# Patient Record
Sex: Male | Born: 1945 | Race: White | Hispanic: No | Marital: Married | Smoking: Former smoker
Health system: Southern US, Community
[De-identification: ages and names within clinical notes are randomized; demographics above are authoritative.]

## PROBLEM LIST (undated history)

## (undated) DIAGNOSIS — H269 Unspecified cataract: Secondary | ICD-10-CM

## (undated) DIAGNOSIS — Q231 Congenital insufficiency of aortic valve: Secondary | ICD-10-CM

## (undated) DIAGNOSIS — E785 Hyperlipidemia, unspecified: Secondary | ICD-10-CM

## (undated) DIAGNOSIS — R7303 Prediabetes: Secondary | ICD-10-CM

## (undated) DIAGNOSIS — Z136 Encounter for screening for cardiovascular disorders: Secondary | ICD-10-CM

## (undated) DIAGNOSIS — Q2381 Bicuspid aortic valve: Secondary | ICD-10-CM

## (undated) DIAGNOSIS — G4730 Sleep apnea, unspecified: Secondary | ICD-10-CM

## (undated) DIAGNOSIS — I10 Essential (primary) hypertension: Secondary | ICD-10-CM

## (undated) DIAGNOSIS — I2510 Atherosclerotic heart disease of native coronary artery without angina pectoris: Secondary | ICD-10-CM

## (undated) DIAGNOSIS — I499 Cardiac arrhythmia, unspecified: Secondary | ICD-10-CM

## (undated) DIAGNOSIS — M1990 Unspecified osteoarthritis, unspecified site: Secondary | ICD-10-CM

## (undated) HISTORY — DX: Bicuspid aortic valve: Q23.81

## (undated) HISTORY — DX: Congenital insufficiency of aortic valve: Q23.1

## (undated) HISTORY — DX: Encounter for screening for cardiovascular disorders: Z13.6

## (undated) HISTORY — DX: Essential (primary) hypertension: I10

## (undated) HISTORY — PX: TONSILECTOMY, ADENOIDECTOMY, BILATERAL MYRINGOTOMY AND TUBES: SHX2538

## (undated) HISTORY — PX: NOSE SURGERY: SHX723

## (undated) HISTORY — PX: COLONOSCOPY: SHX174

## (undated) HISTORY — PX: OTHER SURGICAL HISTORY: SHX169

## (undated) HISTORY — DX: Hyperlipidemia, unspecified: E78.5

## (undated) HISTORY — DX: Unspecified cataract: H26.9

## (undated) HISTORY — PX: ACROMIO-CLAVICULAR JOINT REPAIR: SHX5183

---

## 2006-12-19 LAB — HM COLONOSCOPY

## 2010-01-12 DIAGNOSIS — Z136 Encounter for screening for cardiovascular disorders: Secondary | ICD-10-CM

## 2010-01-12 HISTORY — DX: Encounter for screening for cardiovascular disorders: Z13.6

## 2011-05-05 ENCOUNTER — Ambulatory Visit (INDEPENDENT_AMBULATORY_CARE_PROVIDER_SITE_OTHER): Payer: Medicare PPO | Admitting: Family Medicine

## 2011-05-05 ENCOUNTER — Encounter: Payer: Self-pay | Admitting: Family Medicine

## 2011-05-05 DIAGNOSIS — Q2381 Bicuspid aortic valve: Secondary | ICD-10-CM

## 2011-05-05 DIAGNOSIS — Z8774 Personal history of (corrected) congenital malformations of heart and circulatory system: Secondary | ICD-10-CM | POA: Insufficient documentation

## 2011-05-05 DIAGNOSIS — E785 Hyperlipidemia, unspecified: Secondary | ICD-10-CM

## 2011-05-05 DIAGNOSIS — Z1211 Encounter for screening for malignant neoplasm of colon: Secondary | ICD-10-CM

## 2011-05-05 DIAGNOSIS — I712 Thoracic aortic aneurysm, without rupture, unspecified: Secondary | ICD-10-CM

## 2011-05-05 DIAGNOSIS — Z23 Encounter for immunization: Secondary | ICD-10-CM

## 2011-05-05 DIAGNOSIS — G4733 Obstructive sleep apnea (adult) (pediatric): Secondary | ICD-10-CM

## 2011-05-05 DIAGNOSIS — E559 Vitamin D deficiency, unspecified: Secondary | ICD-10-CM | POA: Insufficient documentation

## 2011-05-05 DIAGNOSIS — I10 Essential (primary) hypertension: Secondary | ICD-10-CM

## 2011-05-05 DIAGNOSIS — Q231 Congenital insufficiency of aortic valve: Secondary | ICD-10-CM

## 2011-05-05 MED ORDER — NIACIN ER (ANTIHYPERLIPIDEMIC) 750 MG PO TBCR: 750.0000 mg | EXTENDED_RELEASE_TABLET | Freq: Every day | ORAL | Status: DC

## 2011-05-05 MED ORDER — AMLODIPINE BESYLATE 5 MG PO TABS: 5.0000 mg | ORAL_TABLET | Freq: Every day | ORAL | Status: DC

## 2011-05-05 MED ORDER — SIMVASTATIN 20 MG PO TABS: 20.0000 mg | ORAL_TABLET | Freq: Every day | ORAL | Status: DC

## 2011-05-05 MED ORDER — LISINOPRIL 40 MG PO TABS: 40.0000 mg | ORAL_TABLET | Freq: Every day | ORAL | Status: DC

## 2011-05-05 NOTE — Assessment & Plan Note (Signed)
Change simcor to simvastatin and niacin to for cost. Sent to [facility]. F/U in 6 mo.

## 2011-05-05 NOTE — Assessment & Plan Note (Signed)
BP well controlled today. Doing well on current reg. RF sent. F/U in 6 mo.

## 2011-05-05 NOTE — Progress Notes (Signed)
  Subjective:    Patient ID: Todd Parsons, male    DOB: July 26, 1946, 65 y.o.   MRN: [id number]  HPI Here to estab care. Now has medicare and says had to switch PCPs with his Human. Had labs done in the spring and ws due for f/u in September.  He would like to split his simcor to make it cheaper.  He wants 90 day supple refills [facility] to [facility] except his fenofibrate which he wants 30 ays supply at [facility]. He tolerates his meds well. Denies any SE.    He repost his pneumonia and shigles vaccines are up to date.  He is OK with flu vac today.    Review of Systems  Constitutional: Negative for fever, diaphoresis and unexpected weight change.  HENT: Negative for hearing loss, rhinorrhea, sneezing and tinnitus.   Eyes: Negative for visual disturbance.  Respiratory: Negative for cough and wheezing.   Cardiovascular: Negative for chest pain and palpitations.  Gastrointestinal: Negative for nausea, vomiting, diarrhea and blood in stool.  Genitourinary: Negative for dysuria and discharge.  Musculoskeletal: Negative for myalgias and arthralgias.  Skin: Negative for rash.  Neurological: Negative for headaches.  Hematological: Negative for adenopathy.  Psychiatric/Behavioral: Negative for sleep disturbance and dysphoric mood. The patient is not nervous/anxious.        BP 116/71  Pulse 81  Ht 5\' 7"  (1.702 m)  Wt 205 lb (92.987 kg)  BMI 32.11 kg/m2    Allergies not on file  Past Medical History  Diagnosis Date  . Hypertension   . Hyperlipidemia     Past Surgical History  Procedure Date  . Left knee arthoscopy   . Apnea device implant   . Tonsilectomy, adenoidectomy, bilateral myringotomy and tubes     History   Social History  . Marital Status: Married    Spouse Name: N/A    Number of Children: N/A  . Years of Education: N/A   Occupational History  . Not on file.   Social History Main Topics  . Smoking status: Former Smoker    Quit date: 04/24/1978  . Smokeless  tobacco: Not on file  . Alcohol Use: Yes  . Drug Use: No  . Sexually Active: Not on file   Other Topics Concern  . Not on file   Social History Narrative  . No narrative on file    Family History  Problem Relation Age of Onset  . Heart disease Father 87    bypass surgyer  . Stroke Mother 33  . Cancer Mother     upper palate    Todd Parsons does not currently have medications on file.  Objective:   Physical Exam  Constitutional: He is oriented to person, place, and time. He appears well-developed and well-nourished.  HENT:  Head: Atraumatic.  Cardiovascular: Normal rate and regular rhythm.        2/6 SEM best heard at the RT sternal border. N ocarotid bruits.   Pulmonary/Chest: Effort normal and breath sounds normal.  Neurological: He is alert and oriented to person, place, and time.  Skin: Skin is warm and dry.  Psychiatric: He has a normal mood and affect. His behavior is normal.          Assessment & Plan:  He is due for 5 year f/u colonoscopy. Was previously seen by Dr. Inge Rise but because of insurance will have to change to [facility]. Will make new referral.

## 2011-06-08 ENCOUNTER — Encounter: Payer: Self-pay | Admitting: Family Medicine

## 2011-06-08 DIAGNOSIS — Z9889 Other specified postprocedural states: Secondary | ICD-10-CM | POA: Insufficient documentation

## 2011-06-25 LAB — HM COLONOSCOPY

## 2011-06-30 ENCOUNTER — Encounter: Payer: Self-pay | Admitting: Family Medicine

## 2011-07-28 ENCOUNTER — Other Ambulatory Visit: Payer: Self-pay | Admitting: *Deleted

## 2011-08-06 ENCOUNTER — Other Ambulatory Visit: Payer: Self-pay | Admitting: *Deleted

## 2011-08-06 MED ORDER — FLUTICASONE PROPIONATE 50 MCG/ACT NA SUSP: 1.0000 | Freq: Every day | NASAL | Status: DC

## 2011-08-06 NOTE — Telephone Encounter (Signed)
Pt calls and states he would like an rx for flonase 90 day supply sent to [facility]. Says has always taken it for his seasonal allergies and did not get rx for it when in for his visit.

## 2011-08-06 NOTE — Telephone Encounter (Signed)
Ok to send flonase for 90 day sup w/ 1 RF.

## 2011-08-09 ENCOUNTER — Other Ambulatory Visit: Payer: Self-pay | Admitting: *Deleted

## 2011-08-09 MED ORDER — FLUTICASONE PROPIONATE 50 MCG/ACT NA SUSP: 1.0000 | Freq: Every day | NASAL | Status: DC

## 2011-08-19 ENCOUNTER — Ambulatory Visit (INDEPENDENT_AMBULATORY_CARE_PROVIDER_SITE_OTHER): Payer: Medicare PPO | Admitting: Family Medicine

## 2011-08-19 ENCOUNTER — Encounter: Payer: Self-pay | Admitting: Family Medicine

## 2011-08-19 DIAGNOSIS — Z23 Encounter for immunization: Secondary | ICD-10-CM

## 2011-08-19 DIAGNOSIS — E785 Hyperlipidemia, unspecified: Secondary | ICD-10-CM

## 2011-08-19 DIAGNOSIS — I712 Thoracic aortic aneurysm, without rupture: Secondary | ICD-10-CM

## 2011-08-19 DIAGNOSIS — M546 Pain in thoracic spine: Secondary | ICD-10-CM

## 2011-08-19 MED ORDER — FENOFIBRATE 160 MG PO TABS: 160.0000 mg | ORAL_TABLET | Freq: Every day | ORAL | Status: DC

## 2011-08-19 NOTE — Patient Instructions (Signed)
Thoracic Strain You have injured the muscles or tendons that attach to the upper part of your back behind your chest. This injury is called a thoracic strain, thoracic sprain, or mid-back strain.   CAUSES   The cause of thoracic strain varies. A less severe injury involves pulling a muscle or tendon without tearing it. A more severe injury involves tearing (rupturing) a muscle or tendon. With less severe injuries, there may be little loss of strength. Sometimes, there are breaks (fractures) in the bones to which the muscles are attached. These fractures are rare, unless there was a direct hit (trauma) or you have weak bones due to osteoporosis or age. Longstanding strains may be caused by overuse or improper form during certain movements. Obesity can also increase your risk for back injuries. Sudden strains may occur due to injury or not warming up properly before exercise. Often, there is no obvious cause for a thoracic strain. SYMPTOMS   The main symptom is pain, especially with movement, such as during exercise. DIAGNOSIS   Your caregiver can usually tell what is wrong by taking an X-ray and doing a physical exam. TREATMENT    Physical therapy may be helpful for recovery. Your caregiver can give you exercises to do or refer you to a physical therapist after your pain improves.     After your pain improves, strengthening and conditioning programs appropriate for your sport or occupation may be helpful.     Always warm up before physical activities or athletics. Stretching after physical activity may also help.     Certain over-the-counter medicines may also help. Ask your caregiver if there are medicines that would help you.  If this is your first thoracic strain injury, proper care and proper healing time before starting activities should prevent long-term problems. Torn ligaments and tendons require as long to heal as broken bones. Average healing times may be only 1 week for a mild strain. For  torn muscles and tendons, healing time may be up to 6 weeks to 2 months. HOME CARE INSTRUCTIONS    Apply ice to the injured area. Ice massages may also be used as directed.     Put ice in a plastic bag.     Place a towel between your skin and the bag.     Leave the ice on for 15 to 20 minutes, 3 to 4 times a day, for the first 2 days.     Only take over-the-counter or prescription medicines for pain, discomfort, or fever as directed by your caregiver.     Keep your appointments for physical therapy if this was prescribed.     Use wraps and back braces as instructed.  SEEK IMMEDIATE MEDICAL CARE IF:    You have an increase in bruising, swelling, or pain.     Your pain has not improved with medicines.     You develop new shortness of breath, chest pain, or fever.     Problems seem to be getting worse rather than better.  MAKE SURE YOU:    Understand these instructions.     Will watch your condition.     Will get help right away if you are not doing well or get worse.  Document Released: 11/13/2003 Document Revised: 05/05/2011 Document Reviewed: 10/09/2010 [facility] Patient Information 2012 [place], [place].

## 2011-08-19 NOTE — Progress Notes (Signed)
  Subjective:    Patient ID: Todd Parsons, male    DOB: Oct 05, 1945, 65 y.o.   MRN: [id number]  HPI 8 days of dull pain under the left shoulder blade. No radiation. Worse in AM and at bedtime. Better during the aytime. Moderate activity. Uses IBU/Tylenol and helps him and helps him sleep. Better when lays on hard floor. No injury  No numbness or tingling. No SOB.  No chest Pain.  Hx of AAA.   Review of Systems     Objective:   Physical Exam  Constitutional: He is oriented to person, place, and time. He appears well-developed and well-nourished.  HENT:  Head: Normocephalic and atraumatic.  Cardiovascular: Normal rate and regular rhythm.        Soft 2/6 systolic ejection murmur best heard at the right sternal border. No abdominal bruits.  Pulmonary/Chest: Effort normal and breath sounds normal.  Musculoskeletal: He exhibits no edema.       Normal range of motion of the lumbar and thoracic spine. Shoulders with normal range of motion and strength over 5 bilaterally. He is tender just below the left scapula radiating from the spine. He is nontender over the spine itself.  Neurological: He is alert and oriented to person, place, and time.  Skin: Skin is warm and dry.  Psychiatric: He has a normal mood and affect.          Assessment & Plan:  Thoracic back pain-I do believe this is musculoskeletal and is not related to his thoracic aorta. He does have a routine followup for this in the spring. I recommend anti-inflammatories up to 3 times a day with food and water to avoid GI irritation since this has helped his pain so far. I also gave him a handout on some stretches to do. If he is not better in the next 3 weeks and please call the office and we will evaluate further. His blood pressure looks well controlled today.  Hyperlipidemia-he is to recheck his labs today. I did give him a slip for lipid panel and CMP as we also have to check kidney and liver function on him. He is not fasting today so  we'll go in the next couple weeks.  Hypertension well controlled today.

## 2011-08-23 ENCOUNTER — Telehealth: Payer: Self-pay | Admitting: Family Medicine

## 2011-08-23 MED ORDER — TRAMADOL HCL 50 MG PO TABS: 50.0000 mg | ORAL_TABLET | Freq: Three times a day (TID) | ORAL | Status: AC | PRN

## 2011-08-23 NOTE — Telephone Encounter (Signed)
Patient walk-in request to know if he can get some meds for his pain.. Was seen Friday and not feeling better.[facility]

## 2011-08-23 NOTE — Telephone Encounter (Signed)
LMOM informing Pt  

## 2011-08-23 NOTE — Telephone Encounter (Signed)
Will send over tramadol. If not helping then consider xray of thoracic spine.

## 2011-08-24 ENCOUNTER — Telehealth: Payer: Self-pay | Admitting: Family Medicine

## 2011-08-24 LAB — COMPLETE METABOLIC PANEL WITH GFR
ALT: 19 U/L (ref 0–53)
Albumin: 4.4 g/dL (ref 3.5–5.2)
CO2: 29 mEq/L (ref 19–32)
Chloride: 104 mEq/L (ref 96–112)
GFR, Est African American: 73 mL/min
GFR, Est Non African American: 63 mL/min
Glucose, Bld: 99 mg/dL (ref 70–99)
Potassium: 5.2 mEq/L (ref 3.5–5.3)
Sodium: 141 mEq/L (ref 135–145)
Total Protein: 7 g/dL (ref 6.0–8.3)

## 2011-08-24 LAB — LIPID PANEL: LDL Cholesterol: 73 mg/dL (ref 0–99)

## 2011-10-15 ENCOUNTER — Other Ambulatory Visit: Payer: Self-pay | Admitting: *Deleted

## 2011-10-15 MED ORDER — SIMVASTATIN 20 MG PO TABS: 20.0000 mg | ORAL_TABLET | Freq: Every day | ORAL | Status: DC

## 2011-10-15 MED ORDER — LISINOPRIL 40 MG PO TABS: 40.0000 mg | ORAL_TABLET | Freq: Every day | ORAL | Status: DC

## 2011-10-15 MED ORDER — FENOFIBRATE 160 MG PO TABS: 160.0000 mg | ORAL_TABLET | Freq: Every day | ORAL | Status: DC

## 2011-10-15 MED ORDER — AMLODIPINE BESYLATE 5 MG PO TABS: 5.0000 mg | ORAL_TABLET | Freq: Every day | ORAL | Status: DC

## 2011-11-29 LAB — LIPID PANEL
Cholesterol: 134 mg/dL (ref 0–200)
LDL Cholesterol: 68 mg/dL
Triglycerides: 123 mg/dL (ref 40–160)

## 2011-11-29 LAB — HEPATIC FUNCTION PANEL: Alkaline Phosphatase: 54 U/L (ref 25–125)

## 2011-11-30 ENCOUNTER — Ambulatory Visit (INDEPENDENT_AMBULATORY_CARE_PROVIDER_SITE_OTHER): Payer: Medicare PPO | Admitting: Family Medicine

## 2011-11-30 ENCOUNTER — Encounter: Payer: Self-pay | Admitting: Family Medicine

## 2011-11-30 VITALS — BP 129/75 | HR 55 | Ht 67.0 in | Wt 206.0 lb

## 2011-11-30 DIAGNOSIS — I712 Thoracic aortic aneurysm, without rupture: Secondary | ICD-10-CM

## 2011-11-30 DIAGNOSIS — I10 Essential (primary) hypertension: Secondary | ICD-10-CM

## 2011-11-30 DIAGNOSIS — Z9181 History of falling: Secondary | ICD-10-CM

## 2011-11-30 DIAGNOSIS — Z1331 Encounter for screening for depression: Secondary | ICD-10-CM

## 2011-11-30 NOTE — Progress Notes (Signed)
  Subjective:    Patient ID: Todd Parsons, male    DOB: [birth year], 66 y.o.   MRN: [id number]  Hypertension This is a chronic problem. The current episode started more than 1 year ago. The problem is controlled. Associated symptoms include shortness of breath. Pertinent negatives include no blurred vision or palpitations. There are no associated agents to hypertension. Past treatments include ACE inhibitors. There are no compliance problems.       Review of Systems  Eyes: Negative for blurred vision.  Respiratory: Positive for shortness of breath.   Cardiovascular: Negative for palpitations.       Objective:   Physical Exam  Constitutional: He is oriented to person, place, and time. He appears well-developed and well-nourished.  HENT:  Head: Normocephalic and atraumatic.  Cardiovascular: Normal rate, regular rhythm and normal heart sounds.   Pulmonary/Chest: Effort normal and breath sounds normal.  Neurological: He is alert and oriented to person, place, and time.  Skin: Skin is warm and dry.  Psychiatric: He has a normal mood and affect. His behavior is normal.          Assessment & Plan:  HTN- doing well. At goal. F/U in 6 months. REviewed his labwork. Work on exercise. Given infor on low sat diet  Depression screen - PHQ-9 score of 0, neg for depression.   Fall Assessment - Score of 1, low fall risk.

## 2011-11-30 NOTE — Patient Instructions (Signed)

## 2011-12-08 ENCOUNTER — Encounter: Payer: Self-pay | Admitting: *Deleted

## 2011-12-14 ENCOUNTER — Other Ambulatory Visit: Payer: Self-pay | Admitting: Family Medicine

## 2012-02-18 ENCOUNTER — Other Ambulatory Visit: Payer: Self-pay | Admitting: *Deleted

## 2012-02-18 MED ORDER — FENOFIBRATE 160 MG PO TABS: 160.0000 mg | ORAL_TABLET | Freq: Every day | ORAL | Status: DC

## 2012-03-07 ENCOUNTER — Ambulatory Visit (INDEPENDENT_AMBULATORY_CARE_PROVIDER_SITE_OTHER): Payer: Medicare PPO

## 2012-03-07 ENCOUNTER — Encounter: Payer: Self-pay | Admitting: Family Medicine

## 2012-03-07 ENCOUNTER — Ambulatory Visit (INDEPENDENT_AMBULATORY_CARE_PROVIDER_SITE_OTHER): Payer: Medicare PPO | Admitting: Family Medicine

## 2012-03-07 ENCOUNTER — Other Ambulatory Visit: Payer: Self-pay | Admitting: *Deleted

## 2012-03-07 VITALS — BP 132/80 | HR 79 | Ht 67.0 in | Wt 205.0 lb

## 2012-03-07 DIAGNOSIS — IMO0002 Reserved for concepts with insufficient information to code with codable children: Secondary | ICD-10-CM

## 2012-03-07 DIAGNOSIS — M25569 Pain in unspecified knee: Secondary | ICD-10-CM

## 2012-03-07 DIAGNOSIS — M25561 Pain in right knee: Secondary | ICD-10-CM

## 2012-03-07 DIAGNOSIS — M1710 Unilateral primary osteoarthritis, unspecified knee: Secondary | ICD-10-CM

## 2012-03-07 MED ORDER — MELOXICAM 7.5 MG PO TABS: 7.5000 mg | ORAL_TABLET | Freq: Two times a day (BID) | ORAL | Status: DC | PRN

## 2012-03-07 NOTE — Progress Notes (Signed)
  Subjective:    Patient ID: Todd Parsons, male    DOB: [birth year], 66 y.o.   MRN: [id number]  Knee Pain  Incident location: aggrevated w/exercise program at/through [facility]. There was no injury mechanism (L knee injury 2007 but now both knees are giving problems). The pain is present in the left knee. The quality of the pain is described as aching and shooting. The pain is mild. The pain has been intermittent since onset. Associated symptoms include tingling. Associated symptoms comments: When there is a breeze both knees start hurting him . Exacerbated by: exercising since mid March. He has tried NSAIDs and acetaminophen (arthroscopy 2007 L knee) for the symptoms. The treatment provided mild relief.    Review of Systems  Neurological: Positive for tingling.      BP 132/80  Pulse 79  Ht 5\' 7"  (1.702 m)  Wt 205 lb (92.987 kg)  BMI 32.11 kg/m2  SpO2 95% Objective:   Physical Exam  Constitutional: He is oriented to person, place, and time. He appears well-developed and well-nourished.  HENT:  Head: Normocephalic.  Musculoskeletal: Normal range of motion. He exhibits tenderness. He exhibits no edema.       There was no significant effusions present both knees appear to be stable but clicking could be heard at both knees.  Neurological: He is alert and oriented to person, place, and time.  Skin: Skin is warm and dry. No rash noted. No erythema.  Psychiatric: He has a normal mood and affect.      Offered to try medication first the patient opts to go ahead with injection. Under sterile condition the medial aspect of the left knee was injected twice to try to ensure good placement of the solution. The knee was cleaned with Betadine and topical freezing anesthetic was applied. A total of 5 ML's were injected. Consisting of 4 ML 2% lidocaine with epi and 1 mL of 80 m Depo-Medrol. Patient tolerated injection well and sent to x-ray for knee films.   Assessment & Plan:  D     Degenerative changes of both knees. Left knee was injected history of previous trauma. Return in one month for followup. Mobic 7.5 mg one tablet once twice a day. Gradually start doing more activity with the knees. Return sooner if any problems arise. May need to consider MRI in the future of the left knee if improvement does not occur.

## 2012-03-07 NOTE — Patient Instructions (Addendum)
Knee Pain The knee is the complex joint between your thigh and your lower leg. It is made up of bones, tendons, ligaments, and cartilage. The bones that make up the knee are:  The femur in the thigh.   The tibia and fibula in the lower leg.   The patella or kneecap riding in the groove on the lower femur.  CAUSES  Knee pain is a common complaint with many causes. A few of these causes are:  Injury, such as:   A ruptured ligament or tendon injury.   Torn cartilage.   Medical conditions, such as:   Gout   Arthritis   Infections   Overuse, over training or overdoing a physical activity.  Knee pain can be minor or severe. Knee pain can accompany debilitating injury. Minor knee problems often respond well to self-care measures or get well on their own. More serious injuries may need medical intervention or even surgery. SYMPTOMS The knee is complex. Symptoms of knee problems can vary widely. Some of the problems are:  Pain with movement and weight bearing.   Swelling and tenderness.   Buckling of the knee.   Inability to straighten or extend your knee.   Your knee locks and you cannot straighten it.   Warmth and redness with pain and fever.   Deformity or dislocation of the kneecap.  DIAGNOSIS  Determining what is wrong may be very straight forward such as when there is an injury. It can also be challenging because of the complexity of the knee. Tests to make a diagnosis may include:  Your caregiver taking a history and doing a physical exam.   Routine X-rays can be used to rule out other problems. X-rays will not reveal a cartilage tear. Some injuries of the knee can be diagnosed by:   Arthroscopy a surgical technique by which a small video camera is inserted through tiny incisions on the sides of the knee. This procedure is used to examine and repair internal knee joint problems. Tiny instruments can be used during arthroscopy to repair the torn knee cartilage  (meniscus).   Arthrography is a radiology technique. A contrast liquid is directly injected into the knee joint. Internal structures of the knee joint then become visible on X-ray film.   An MRI scan is a non x-ray radiology procedure in which magnetic fields and a computer produce two- or three-dimensional images of the inside of the knee. Cartilage tears are often visible using an MRI scanner. MRI scans have largely replaced arthrography in diagnosing cartilage tears of the knee.   Blood work.   Examination of the fluid that helps to lubricate the knee joint (synovial fluid). This is done by taking a sample out using a needle and a syringe.  TREATMENT The treatment of knee problems depends on the cause. Some of these treatments are:  Depending on the injury, proper casting, splinting, surgery or physical therapy care will be needed.   Give yourself adequate recovery time. Do not overuse your joints. If you begin to get sore during workout routines, back off. Slow down or do fewer repetitions.   For repetitive activities such as cycling or running, maintain your strength and nutrition.   Alternate muscle groups. For example if you are a weight lifter, work the upper body on one day and the lower body the next.   Either tight or weak muscles do not give the proper support for your knee. Tight or weak muscles do not absorb the stress placed   on the knee joint. Keep the muscles surrounding the knee strong.   Take care of mechanical problems.   If you have flat feet, orthotics or special shoes may help. See your caregiver if you need help.   Arch supports, sometimes with wedges on the inner or outer aspect of the heel, can help. These can shift pressure away from the side of the knee most bothered by osteoarthritis.   A brace called an "unloader" brace also may be used to help ease the pressure on the most arthritic side of the knee.   If your caregiver has prescribed crutches, braces,  wraps or ice, use as directed. The acronym for this is PRICE. This means protection, rest, ice, compression and elevation.   Nonsteroidal anti-inflammatory drugs (NSAID's), can help relieve pain. But if taken immediately after an injury, they may actually increase swelling. Take NSAID's with food in your stomach. Stop them if you develop stomach problems. Do not take these if you have a history of ulcers, stomach pain or bleeding from the bowel. Do not take without your caregiver's approval if you have problems with fluid retention, heart failure, or kidney problems.   For ongoing knee problems, physical therapy may be helpful.   Glucosamine and chondroitin are over-the-counter dietary supplements. Both may help relieve the pain of osteoarthritis in the knee. These medicines are different from the usual anti-inflammatory drugs. Glucosamine may decrease the rate of cartilage destruction.   Injections of a corticosteroid drug into your knee joint may help reduce the symptoms of an arthritis flare-up. They may provide pain relief that lasts a few months. You may have to wait a few months between injections. The injections do have a small increased risk of infection, water retention and elevated blood sugar levels.   Hyaluronic acid injected into damaged joints may ease pain and provide lubrication. These injections may work by reducing inflammation. A series of shots may give relief for as long as 6 months.   Topical painkillers. Applying certain ointments to your skin may help relieve the pain and stiffness of osteoarthritis. Ask your pharmacist for suggestions. Many over the-counter products are approved for temporary relief of arthritis pain.   In some countries, doctors often prescribe topical NSAID's for relief of chronic conditions such as arthritis and tendinitis. A review of treatment with NSAID creams found that they worked as well as oral medications but without the serious side effects.    PREVENTION  Maintain a healthy weight. Extra pounds put more strain on your joints.   Get strong, stay limber. Weak muscles are a common cause of knee injuries. Stretching is important. Include flexibility exercises in your workouts.   Be smart about exercise. If you have osteoarthritis, chronic knee pain or recurring injuries, you may need to change the way you exercise. This does not mean you have to stop being active. If your knees ache after jogging or playing basketball, consider switching to swimming, water aerobics or other low-impact activities, at least for a few days a week. Sometimes limiting high-impact activities will provide relief.   Make sure your shoes fit well. Choose footwear that is right for your sport.   Protect your knees. Use the proper gear for knee-sensitive activities. Use kneepads when playing volleyball or laying carpet. Buckle your seat belt every time you drive. Most shattered kneecaps occur in car accidents.   Rest when you are tired.  SEEK MEDICAL CARE IF:  You have knee pain that is continual and does not   seem to be getting better.  SEEK IMMEDIATE MEDICAL CARE IF:  Your knee joint feels hot to the touch and you have a high fever. MAKE SURE YOU:   Understand these instructions.   Will watch your condition.   Will get help right away if you are not doing well or get worse.  Document Released: 06/20/2007 Document Revised: 08/12/2011 Document Reviewed: 06/20/2007 [employer] Patient Information 2012 [employer]. 

## 2012-05-19 ENCOUNTER — Encounter: Payer: Self-pay | Admitting: Sports Medicine

## 2012-05-19 ENCOUNTER — Ambulatory Visit (INDEPENDENT_AMBULATORY_CARE_PROVIDER_SITE_OTHER): Payer: Medicare PPO | Admitting: Sports Medicine

## 2012-05-19 VITALS — BP 124/83 | HR 80 | Wt 205.0 lb

## 2012-05-19 DIAGNOSIS — Z23 Encounter for immunization: Secondary | ICD-10-CM

## 2012-05-19 DIAGNOSIS — M1710 Unilateral primary osteoarthritis, unspecified knee: Secondary | ICD-10-CM

## 2012-05-19 DIAGNOSIS — IMO0002 Reserved for concepts with insufficient information to code with codable children: Secondary | ICD-10-CM

## 2012-05-19 DIAGNOSIS — M1712 Unilateral primary osteoarthritis, left knee: Secondary | ICD-10-CM | POA: Insufficient documentation

## 2012-05-19 NOTE — Assessment & Plan Note (Signed)
Failed conservative management. Corticosteroid injection was moderately efficacious for only about one month. Supartz #1 of 5 started today. He will come back in one week for Supartz injection #2 of 5

## 2012-05-19 NOTE — Progress Notes (Signed)
Patient ID: Chae Robley, male   DOB: 02/25/1946, 66 y.o.   MRN: [id number] Subjective:    I'm seeing this patient as a consultation for:  Dr. Thurmond Butts  CC: Left knee pain  HPI: Imran is a pleasant 66 year old male who is in good health, until several months ago. He began to have some pain in his both knees. He tried some over-the-counter analgesics which were ineffective, at which point he received a steroid injection of his left knee. This lasted just over a month, and the pain came back. The pain is localized underneath his kneecap, and is associated with morning stiffness, and gelling. He denies any swelling, and denies any buckling, popping, or locking. He is never undergone viscous supplementation.  Past medical history, Surgical history, Family history, Social history, Allergies, and medications have been entered into the medical record, reviewed, and no changes needed.   Review of Systems: No headache, visual changes, nausea, vomiting, diarrhea, constipation, dizziness, abdominal pain, skin rash, fevers, chills, night sweats, weight loss, body aches, joint swelling, muscle aches, chest pain, or shortness of breath.   Objective:   Vitals:  Afebrile, vital signs stable. General: Well Developed, well nourished, and in no acute distress.  Neuro/Psych: Alert and oriented x3, extra-ocular muscles intact, able to move all 4 extremities.  Skin: Warm and dry, no rashes noted.  Respiratory: Not using accessory muscles, speaking in full sentences, trachea midline.  Cardiovascular: Pulses palpable, no extremity edema. Abdomen: Does not appear distended. Left Knee: Normal to inspection with no erythema or effusion or obvious bony abnormalities. Palpation normal with no warmth, joint line tenderness, patellar tenderness, or condyle tenderness. ROM full in flexion and extension and lower leg rotation. Ligaments with solid consistent endpoints including ACL, PCL, LCL, MCL. Negative Mcmurray's, Apley's, and  Thessalonian tests. Non painful patellar compression. Patellar glide with crepitus Patellar and quadriceps tendons unremarkable. Hamstring and quadriceps strength is normal.   I did review the x-rays of his knee which show minimal tibiofemoral degenerative changes, but moderate patellofemoral degenerative changes.  Procedure: Real-time Ultrasound Guided Injection of left knee Device: GE Logiq E  Ultrasound guided injection is preferred based studies that show increased duration, increased effect, greater accuracy, decreased procedural pain, increased response rate, and decreased cost with ultrasound guided versus blind injection.  Verbal informed consent obtained.  Time-out conducted.  Noted no overlying erythema, induration, or other signs of local infection.  Skin prepped in a sterile fashion.  Local anesthesia: Topical Ethyl chloride.  With sterile technique and under real time ultrasound guidance:  25 mg/2.5 mL of Supartz (sodium hyaluronate) in a prefilled syringe was injected easily into the knee through a 22-gauge needle into the suprapatellar recess. Completed without difficulty  Pain immediately resolved suggesting accurate placement of the medication.  Advised to call if fevers/chills, erythema, induration, drainage, or persistent bleeding.  Images permanently stored and available for review in the ultrasound unit.  Impression: Technically successful ultrasound guided injection.   Impression and Recommendations:   This case required medical decision making of moderate complexity.

## 2012-05-24 LAB — LIPID PANEL
Cholesterol: 163 mg/dL (ref 0–200)
HDL: 34 mg/dL — AB (ref 35–70)
Triglycerides: 191 mg/dL — AB (ref 40–160)

## 2012-05-24 LAB — HEPATIC FUNCTION PANEL: AST: 21 U/L (ref 14–40)

## 2012-05-24 LAB — BASIC METABOLIC PANEL: Glucose: 119 mg/dL

## 2012-05-25 ENCOUNTER — Encounter: Payer: Self-pay | Admitting: Family Medicine

## 2012-05-25 ENCOUNTER — Ambulatory Visit (INDEPENDENT_AMBULATORY_CARE_PROVIDER_SITE_OTHER): Payer: Medicare PPO | Admitting: Sports Medicine

## 2012-05-25 ENCOUNTER — Encounter: Payer: Medicare PPO | Admitting: Family Medicine

## 2012-05-25 DIAGNOSIS — M1712 Unilateral primary osteoarthritis, left knee: Secondary | ICD-10-CM

## 2012-05-25 DIAGNOSIS — IMO0002 Reserved for concepts with insufficient information to code with codable children: Secondary | ICD-10-CM

## 2012-05-25 DIAGNOSIS — M1710 Unilateral primary osteoarthritis, unspecified knee: Secondary | ICD-10-CM

## 2012-05-25 NOTE — Assessment & Plan Note (Signed)
Supartz injection #2 of 5. He'll come back in one week for #3.

## 2012-05-25 NOTE — Progress Notes (Signed)
Subjective:    CC: Supartz injection #2 of 5 into the left knee  HPI: Todd Parsons presents for injection as above.  Past medical history, Surgical history, Family history, Social history, Allergies, and medications have been entered into the medical record, reviewed, and no changes needed.   Review of Systems: No fevers, chills, night sweats, weight loss, chest pain, or shortness of breath.   Objective:    General: Well Developed, well nourished, and in no acute distress.  Procedure: Real-time Ultrasound Guided Injection of left knee Device: GE Logiq E  Ultrasound guided injection is preferred based studies that show increased duration, increased effect, greater accuracy, decreased procedural pain, increased response rate, and decreased cost with ultrasound guided versus blind injection.  Verbal informed consent obtained.  Time-out conducted.  Noted no overlying erythema, induration, or other signs of local infection.  Skin prepped in a sterile fashion.  Local anesthesia: Topical Ethyl chloride.  With sterile technique and under real time ultrasound guidance:  25 mg/2.5 mL of Supartz (sodium hyaluronate) in a prefilled syringe was injected easily into the knee through a 22-gauge needle into the suprapatellar recess under real-time ultrasound guidance. Completed without difficulty  Pain immediately resolved suggesting accurate placement of the medication.  Advised to call if fevers/chills, erythema, induration, drainage, or persistent bleeding.  Images permanently stored and available for review in the ultrasound unit.  Impression: Technically successful ultrasound guided injection.   Impression and Recommendations:

## 2012-05-26 ENCOUNTER — Ambulatory Visit: Payer: Medicare PPO | Admitting: Sports Medicine

## 2012-06-01 ENCOUNTER — Ambulatory Visit: Payer: Medicare PPO | Admitting: Family Medicine

## 2012-06-01 ENCOUNTER — Encounter: Payer: Self-pay | Admitting: Sports Medicine

## 2012-06-01 ENCOUNTER — Ambulatory Visit (INDEPENDENT_AMBULATORY_CARE_PROVIDER_SITE_OTHER): Payer: Medicare PPO | Admitting: Sports Medicine

## 2012-06-01 VITALS — BP 121/65 | HR 74 | Temp 98.3°F | Wt 206.0 lb

## 2012-06-01 DIAGNOSIS — M1710 Unilateral primary osteoarthritis, unspecified knee: Secondary | ICD-10-CM

## 2012-06-01 DIAGNOSIS — M1712 Unilateral primary osteoarthritis, left knee: Secondary | ICD-10-CM

## 2012-06-01 DIAGNOSIS — IMO0002 Reserved for concepts with insufficient information to code with codable children: Secondary | ICD-10-CM

## 2012-06-01 NOTE — Progress Notes (Signed)
Subjective:    CC: Supartz injection #3 of 5 into the left knee  HPI: Todd Parsons presents for injection as above.  Overall he is experiencing a good amount of benefit so far  Past medical history, Surgical history, Family history, Social history, Allergies, and medications have been entered into the medical record, reviewed, and no changes needed.   Review of Systems: No fevers, chills, night sweats, weight loss, chest pain, or shortness of breath.   Objective:    General: Well Developed, well nourished, and in no acute distress.  Procedure: Real-time Ultrasound Guided Injection of left knee Device: GE Logiq E  Ultrasound guided injection is preferred based studies that show increased duration, increased effect, greater accuracy, decreased procedural pain, increased response rate, and decreased cost with ultrasound guided versus blind injection.  Verbal informed consent obtained.  Time-out conducted.  Noted no overlying erythema, induration, or other signs of local infection.  Skin prepped in a sterile fashion.  Local anesthesia: Topical Ethyl chloride.  With sterile technique and under real time ultrasound guidance:  25 mg/2.5 mL of Supartz (sodium hyaluronate) in a prefilled syringe was injected easily into the knee through a 22-gauge needle into the suprapatellar recess under real-time ultrasound guidance. Completed without difficulty  Pain immediately resolved suggesting accurate placement of the medication.  Advised to call if fevers/chills, erythema, induration, drainage, or persistent bleeding.  Images permanently stored and available for review in the ultrasound unit.  Impression: Technically successful ultrasound guided injection.   Impression and Recommendations:

## 2012-06-01 NOTE — Assessment & Plan Note (Signed)
Supartz injection #3 of 5 given today. He is experiencing some benefit. He'll come back in one week for injection #4 of 5.

## 2012-06-07 ENCOUNTER — Encounter: Payer: Self-pay | Admitting: *Deleted

## 2012-06-08 ENCOUNTER — Ambulatory Visit (INDEPENDENT_AMBULATORY_CARE_PROVIDER_SITE_OTHER): Payer: Medicare PPO | Admitting: Sports Medicine

## 2012-06-08 ENCOUNTER — Encounter: Payer: Self-pay | Admitting: Sports Medicine

## 2012-06-08 VITALS — BP 114/66 | HR 96

## 2012-06-08 DIAGNOSIS — M1712 Unilateral primary osteoarthritis, left knee: Secondary | ICD-10-CM

## 2012-06-08 DIAGNOSIS — M1710 Unilateral primary osteoarthritis, unspecified knee: Secondary | ICD-10-CM

## 2012-06-08 DIAGNOSIS — IMO0002 Reserved for concepts with insufficient information to code with codable children: Secondary | ICD-10-CM

## 2012-06-08 NOTE — Progress Notes (Signed)
Subjective:    CC: Supartz injection #4 of 5 into the left knee  HPI: Todd Parsons presents for injection as above.  Overall he is experiencing an excellent amount of benefit so far  Past medical history, Surgical history, Family history, Social history, Allergies, and medications have been entered into the medical record, reviewed, and no changes needed.   Review of Systems: No fevers, chills, night sweats, weight loss, chest pain, or shortness of breath.   Objective:    General: Well Developed, well nourished, and in no acute distress.  Procedure: Real-time Ultrasound Guided Injection of left knee Device: GE Logiq E  Ultrasound guided injection is preferred based studies that show increased duration, increased effect, greater accuracy, decreased procedural pain, increased response rate, and decreased cost with ultrasound guided versus blind injection.  Verbal informed consent obtained.  Time-out conducted.  Noted no overlying erythema, induration, or other signs of local infection.  Skin prepped in a sterile fashion.  Local anesthesia: Topical Ethyl chloride.  With sterile technique and under real time ultrasound guidance:  25 mg/2.5 mL of Supartz (sodium hyaluronate) in a prefilled syringe was injected easily into the knee through a 22-gauge needle into the suprapatellar recess under real-time ultrasound guidance. Completed without difficulty  Pain immediately resolved suggesting accurate placement of the medication.  Advised to call if fevers/chills, erythema, induration, drainage, or persistent bleeding.  Images permanently stored and available for review in the ultrasound unit.  Impression: Technically successful ultrasound guided injection.   Impression and Recommendations:

## 2012-06-08 NOTE — Assessment & Plan Note (Signed)
Supartz injection #4 of 5 given today. He is experiencing some benefit. He'll come back in one week for injection #5 of 5.

## 2012-06-14 NOTE — Progress Notes (Signed)
This encounter was created in error - please disregard.

## 2012-06-15 ENCOUNTER — Encounter: Payer: Self-pay | Admitting: Sports Medicine

## 2012-06-15 ENCOUNTER — Encounter: Payer: Self-pay | Admitting: *Deleted

## 2012-06-15 ENCOUNTER — Ambulatory Visit (INDEPENDENT_AMBULATORY_CARE_PROVIDER_SITE_OTHER): Payer: Medicare PPO | Admitting: Sports Medicine

## 2012-06-15 VITALS — BP 116/69 | HR 60 | Ht 67.0 in | Wt 206.0 lb

## 2012-06-15 DIAGNOSIS — W57XXXA Bitten or stung by nonvenomous insect and other nonvenomous arthropods, initial encounter: Secondary | ICD-10-CM | POA: Insufficient documentation

## 2012-06-15 DIAGNOSIS — T148 Other injury of unspecified body region: Secondary | ICD-10-CM

## 2012-06-15 DIAGNOSIS — M1712 Unilateral primary osteoarthritis, left knee: Secondary | ICD-10-CM

## 2012-06-15 DIAGNOSIS — IMO0002 Reserved for concepts with insufficient information to code with codable children: Secondary | ICD-10-CM

## 2012-06-15 DIAGNOSIS — M1710 Unilateral primary osteoarthritis, unspecified knee: Secondary | ICD-10-CM

## 2012-06-15 MED ORDER — TRIAMCINOLONE ACETONIDE 0.5 % EX CREA: TOPICAL_CREAM | Freq: Two times a day (BID) | CUTANEOUS | Status: DC

## 2012-06-15 NOTE — Progress Notes (Signed)
Subjective:    CC: Supartz injection, insect bites  HPI: Left knee osteoarthritis: oral comes in for Supartz injection #5 of 5, he's noted excellent benefit, and is back to exercising, and is no longer working up from sleep.  Was bitten yesterday by several yellow jackets, located on his arms, wondering if there is a cream that can be used. It's itchy, red, does not radiate. No shortness of breath.  Past medical history, Surgical history, Family history, Social history, Allergies, and medications have been entered into the medical record, reviewed, and no changes needed.   Review of Systems: No fevers, chills, night sweats, weight loss, chest pain, or shortness of breath.   Objective:    General: Well Developed, well nourished, and in no acute distress.  Neuro: Alert and oriented x3, extra-ocular muscles intact.  HEENT: Normocephalic, atraumatic, pupils equal round reactive to light, neck supple, no masses, no lymphadenopathy, thyroid nonpalpable.  Skin: Warm and dry, erythematous insect bites, excoriated noted on his arms. Respiratory: Not using accessory muscles, speaking in full sentences.  Procedure: Real-time Ultrasound Guided Injection of left knee Device: GE Logiq E  Ultrasound guided injection is preferred based studies that show increased duration, increased effect, greater accuracy, decreased procedural pain, increased response rate, and decreased cost with ultrasound guided versus blind injection.  Verbal informed consent obtained.  Time-out conducted.  Noted no overlying erythema, induration, or other signs of local infection.  Skin prepped in a sterile fashion.  Local anesthesia: Topical Ethyl chloride.  With sterile technique and under real time ultrasound guidance:  25 mg/2.5 mL of Supartz (sodium hyaluronate) in a prefilled syringe was injected easily into the knee through a 22-gauge needle. Completed without difficulty  Pain immediately resolved suggesting accurate  placement of the medication.  Advised to call if fevers/chills, erythema, induration, drainage, or persistent bleeding.  Images permanently stored and available for review in the ultrasound unit.  Impression: Technically successful ultrasound guided injection.  Impression and Recommendations:

## 2012-06-15 NOTE — Assessment & Plan Note (Signed)
Yellow jacket stings. Triamcinolone cream.

## 2012-06-15 NOTE — Assessment & Plan Note (Signed)
Fifth and final Supartz injection given today. He will come back to see me in one month to reassess his situation.

## 2012-07-26 ENCOUNTER — Encounter: Payer: Self-pay | Admitting: Sports Medicine

## 2012-07-26 ENCOUNTER — Ambulatory Visit (INDEPENDENT_AMBULATORY_CARE_PROVIDER_SITE_OTHER): Payer: Medicare PPO | Admitting: Sports Medicine

## 2012-07-26 VITALS — BP 141/89 | HR 60 | Temp 98.2°F | Wt 205.0 lb

## 2012-07-26 DIAGNOSIS — M1710 Unilateral primary osteoarthritis, unspecified knee: Secondary | ICD-10-CM

## 2012-07-26 DIAGNOSIS — M1711 Unilateral primary osteoarthritis, right knee: Secondary | ICD-10-CM | POA: Insufficient documentation

## 2012-07-26 DIAGNOSIS — M1712 Unilateral primary osteoarthritis, left knee: Secondary | ICD-10-CM

## 2012-07-26 DIAGNOSIS — IMO0002 Reserved for concepts with insufficient information to code with codable children: Secondary | ICD-10-CM

## 2012-07-26 NOTE — Progress Notes (Signed)
SPORTS MEDICINE CONSULTATION REPORT  Subjective:    CC: Knee pain  HPI: Left knee: He is finished the entire Supartz series for primary osteoarthritis, symptoms are vastly improved.  Right knee: He is now noting pain in the right knee, both joint lines, worse with ambulation, and going downstairs, no radiation, severe.  He's been using oral analgesics, he does not been helping. He denies mechanical symptoms, does have some swelling.   Past medical history, Surgical history, Family history, Social history, Allergies, and medications have been entered into the medical record, reviewed, and no changes needed.   Review of Systems: No headache, visual changes, nausea, vomiting, diarrhea, constipation, dizziness, abdominal pain, skin rash, fevers, chills, night sweats, weight loss, swollen lymph nodes, body aches, joint swelling, muscle aches, chest pain, or shortness of breath.   Objective:   Vitals:  Afebrile, vital signs stable. General: Well Developed, well nourished, and in no acute distress.  Neuro/Psych: Alert and oriented x3, extra-ocular muscles intact, able to move all 4 extremities.  Skin: Warm and dry, no rashes noted.  Respiratory: Not using accessory muscles, speaking in full sentences, trachea midline.  Cardiovascular: Pulses palpable, no extremity edema. Abdomen: Does not appear distended. Right Knee: Normal to inspection with no erythema or effusion or obvious bony abnormalities. Tender to palpation on both joint lines, moderate effusion palpable with a fluid wave. ROM full in flexion and extension and lower leg rotation. Ligaments with solid consistent endpoints including ACL, PCL, LCL, MCL. Negative Mcmurray's, Apley's, and Thessalonian tests. Non painful patellar compression. Patellar glide without crepitus. Patellar and quadriceps tendons unremarkable. Hamstring and quadriceps strength is normal.   Procedure: Real-time Ultrasound Guided aspiration and injection of  right knee. Device: GE Logiq E  Ultrasound guided injection is preferred based studies that show increased duration, increased effect, greater accuracy, decreased procedural pain, increased response rate, and decreased cost with ultrasound guided versus blind injection.  Verbal informed consent obtained.  Time-out conducted.  Noted no overlying erythema, induration, or other signs of local infection.  Skin prepped in a sterile fashion.  Local anesthesia: Topical Ethyl chloride.  With sterile technique and under real time ultrasound guidance:  5 cc of lidocaine infiltrated into the patellar recess overlying skin. 18-gauge needle with a 60 cc syringe advanced under ultrasound guidance into suprapatellar recess which was distended with fluid. 28 cc of straw-colored fluid aspirated, syringe switched, 2 cc triamcinolone 40, 4 cc lidocaine injected into the knee. Completed without difficulty  Pain immediately resolved suggesting accurate placement of the medication.  Advised to call if fevers/chills, erythema, induration, drainage, or persistent bleeding.  Images permanently stored and available for review in the ultrasound unit.  Impression: Technically successful ultrasound guided injection.  I then strapped to the knee with elastic bandage myself.  Impression and Recommendations:   This case required medical decision making of moderate complexity.

## 2012-07-26 NOTE — Assessment & Plan Note (Addendum)
Continues to do well after finishing his first Supartz series. We can repeat this up to every 6 months if benefit remains good.

## 2012-07-26 NOTE — Assessment & Plan Note (Signed)
Injection and aspiration as above. Ace wrap. Come back to see me in one month to see how things are going, if no better we can start Visco supplementation.

## 2012-08-08 ENCOUNTER — Other Ambulatory Visit: Payer: Self-pay | Admitting: *Deleted

## 2012-08-08 ENCOUNTER — Encounter: Payer: Self-pay | Admitting: Sports Medicine

## 2012-08-08 ENCOUNTER — Ambulatory Visit (INDEPENDENT_AMBULATORY_CARE_PROVIDER_SITE_OTHER): Payer: Medicare PPO | Admitting: Sports Medicine

## 2012-08-08 VITALS — BP 108/76 | HR 81 | Wt 203.0 lb

## 2012-08-08 DIAGNOSIS — IMO0002 Reserved for concepts with insufficient information to code with codable children: Secondary | ICD-10-CM

## 2012-08-08 DIAGNOSIS — M1711 Unilateral primary osteoarthritis, right knee: Secondary | ICD-10-CM

## 2012-08-08 DIAGNOSIS — M1710 Unilateral primary osteoarthritis, unspecified knee: Secondary | ICD-10-CM

## 2012-08-08 MED ORDER — FENOFIBRATE 160 MG PO TABS: 160.0000 mg | ORAL_TABLET | Freq: Every day | ORAL | Status: DC

## 2012-08-08 MED ORDER — SIMVASTATIN 20 MG PO TABS: 20.0000 mg | ORAL_TABLET | Freq: Every day | ORAL | Status: DC

## 2012-08-08 MED ORDER — TRAMADOL HCL 50 MG PO TABS: 50.0000 mg | ORAL_TABLET | Freq: Three times a day (TID) | ORAL | Status: DC | PRN

## 2012-08-08 MED ORDER — FLUTICASONE PROPIONATE 50 MCG/ACT NA SUSP: 1.0000 | Freq: Every day | NASAL | Status: DC

## 2012-08-08 MED ORDER — AMLODIPINE BESYLATE 5 MG PO TABS: 5.0000 mg | ORAL_TABLET | Freq: Every day | ORAL | Status: DC

## 2012-08-08 MED ORDER — LISINOPRIL 40 MG PO TABS: 40.0000 mg | ORAL_TABLET | Freq: Every day | ORAL | Status: DC

## 2012-08-08 MED ORDER — CAPSAICIN 0.1 % EX CREA: TOPICAL_CREAM | CUTANEOUS | Status: DC

## 2012-08-08 NOTE — Progress Notes (Signed)
SPORTS MEDICINE CONSULTATION REPORT  Subjective:    CC: Followup, increasing pain in knees.  HPI: Right knee: I aspirated, and injected his knee the last visit, he had excellent response for about 10 days, unfortunately the pain has started to creep back. He has known bilateral osteoarthritis. He localizes the pain in both joint lines, and notes it's worse with weightbearing. The pain does not radiate.  Left knee: He has finished a Supartz series recently. Overall is doing very well, however unfortunately he started to note an increase in his pain again. He is only using Tylenol. He is never done formal physical therapy.  Past medical history, Surgical history, Family history, Social history, Allergies, and medications have been entered into the medical record, reviewed, and no changes needed.   Review of Systems: No headache, visual changes, nausea, vomiting, diarrhea, constipation, dizziness, abdominal pain, skin rash, fevers, chills, night sweats, weight loss, swollen lymph nodes, body aches, joint swelling, muscle aches, chest pain, shortness of breath, mood changes, visual or auditory hallucinations.   Objective:   Vitals:  Afebrile, vital signs stable. General: Well Developed, well nourished, and in no acute distress.  Neuro/Psych: Alert and oriented x3, extra-ocular muscles intact, able to move all 4 extremities.  Skin: Warm and dry, no rashes noted.  Respiratory: Not using accessory muscles, speaking in full sentences, trachea midline.  Cardiovascular: Pulses palpable, no extremity edema. Abdomen: Does not appear distended. Bilateral Knees: Normal to inspection with no erythema or effusion or obvious bony abnormalities. Palpation normal with no warmth, joint line tenderness, patellar tenderness, or condyle tenderness. ROM full in flexion and extension and lower leg rotation. Ligaments with solid consistent endpoints including ACL, PCL, LCL, MCL. Negative Mcmurray's, Apley's, and  Thessalonian tests. Non painful patellar compression. Patellar glide without crepitus. Patellar and quadriceps tendons unremarkable. Hamstring and quadriceps strength is normal.   Procedure: Real-time Ultrasound Guided Injection of right knee Device: GE Logiq E  Ultrasound guided injection is preferred based studies that show increased duration, increased effect, greater accuracy, decreased procedural pain, increased response rate, and decreased cost with ultrasound guided versus blind injection.  Verbal informed consent obtained.  Time-out conducted.  Noted no overlying erythema, induration, or other signs of local infection.  Skin prepped in a sterile fashion.  Local anesthesia: Topical Ethyl chloride.  With sterile technique and under real time ultrasound guidance:  25 mg/2.5 mL of Supartz (sodium hyaluronate) in a prefilled syringe was injected easily into the knee through a 22-gauge needle. Completed without difficulty  Pain immediately resolved suggesting accurate placement of the medication.  Advised to call if fevers/chills, erythema, induration, drainage, or persistent bleeding.  Images permanently stored and available for review in the ultrasound unit.  Impression: Technically successful ultrasound guided injection.  Impression and Recommendations:   This case required medical decision making of moderate complexity.

## 2012-08-08 NOTE — Assessment & Plan Note (Addendum)
Failed steroid injection. Supartz injection #1 of 5 given today. Return for #2. I would also like him to try one visit with physical therapy to learn some osteoarthritis specific exercises. Tramadol as needed for pain. He can also try capsaicin.

## 2012-08-11 ENCOUNTER — Ambulatory Visit: Payer: Medicare PPO | Attending: Sports Medicine | Admitting: Physical Therapy

## 2012-08-11 DIAGNOSIS — M25569 Pain in unspecified knee: Secondary | ICD-10-CM | POA: Insufficient documentation

## 2012-08-11 DIAGNOSIS — IMO0001 Reserved for inherently not codable concepts without codable children: Secondary | ICD-10-CM | POA: Insufficient documentation

## 2012-08-11 DIAGNOSIS — M6281 Muscle weakness (generalized): Secondary | ICD-10-CM | POA: Insufficient documentation

## 2012-08-15 ENCOUNTER — Encounter: Payer: Self-pay | Admitting: Sports Medicine

## 2012-08-15 ENCOUNTER — Ambulatory Visit (INDEPENDENT_AMBULATORY_CARE_PROVIDER_SITE_OTHER): Payer: Medicare PPO | Admitting: Sports Medicine

## 2012-08-15 VITALS — BP 120/82 | HR 83 | Wt 201.0 lb

## 2012-08-15 DIAGNOSIS — M1712 Unilateral primary osteoarthritis, left knee: Secondary | ICD-10-CM

## 2012-08-15 DIAGNOSIS — M1710 Unilateral primary osteoarthritis, unspecified knee: Secondary | ICD-10-CM

## 2012-08-15 DIAGNOSIS — IMO0002 Reserved for concepts with insufficient information to code with codable children: Secondary | ICD-10-CM

## 2012-08-15 DIAGNOSIS — M1711 Unilateral primary osteoarthritis, right knee: Secondary | ICD-10-CM

## 2012-08-15 NOTE — Assessment & Plan Note (Signed)
Doing well status post Supartz series. Continue formal physical therapy.

## 2012-08-15 NOTE — Assessment & Plan Note (Signed)
Supartz injection #2 of 5 given today. Continue physical therapy. Return in one week for #3 of 5.

## 2012-08-15 NOTE — Progress Notes (Signed)
Procedure: Real-time Ultrasound Guided Injection of right knee Device: GE Logiq E  Ultrasound guided injection is preferred based studies that show increased duration, increased effect, greater accuracy, decreased procedural pain, increased response rate, and decreased cost with ultrasound guided versus blind injection.  Verbal informed consent obtained.  Time-out conducted.  Noted no overlying erythema, induration, or other signs of local infection.  Skin prepped in a sterile fashion.  Local anesthesia: Topical Ethyl chloride.  With sterile technique and under real time ultrasound guidance:  25 mg/2.5 mL of Supartz (sodium hyaluronate) in a prefilled syringe was injected easily into the knee through a 22-gauge needle. Completed without difficulty  Pain immediately resolved suggesting accurate placement of the medication.  Advised to call if fevers/chills, erythema, induration, drainage, or persistent bleeding.  Images permanently stored and available for review in the ultrasound unit.  Impression: Technically successful ultrasound guided injection. 

## 2012-08-22 ENCOUNTER — Encounter: Payer: Self-pay | Admitting: Sports Medicine

## 2012-08-22 ENCOUNTER — Ambulatory Visit (INDEPENDENT_AMBULATORY_CARE_PROVIDER_SITE_OTHER): Payer: Medicare PPO | Admitting: Sports Medicine

## 2012-08-22 ENCOUNTER — Ambulatory Visit: Payer: Medicare PPO | Admitting: Physical Therapy

## 2012-08-22 VITALS — BP 120/83 | HR 80 | Wt 202.0 lb

## 2012-08-22 DIAGNOSIS — M1710 Unilateral primary osteoarthritis, unspecified knee: Secondary | ICD-10-CM

## 2012-08-22 DIAGNOSIS — M1712 Unilateral primary osteoarthritis, left knee: Secondary | ICD-10-CM

## 2012-08-22 DIAGNOSIS — M1711 Unilateral primary osteoarthritis, right knee: Secondary | ICD-10-CM

## 2012-08-22 DIAGNOSIS — IMO0002 Reserved for concepts with insufficient information to code with codable children: Secondary | ICD-10-CM

## 2012-08-22 MED ORDER — DICLOFENAC SODIUM 1 % TD GEL: 4.0000 g | Freq: Four times a day (QID) | TRANSDERMAL | Status: DC

## 2012-08-22 NOTE — Assessment & Plan Note (Signed)
Supartz injection #3 of 5 given today. Continue physical therapy. Return in one week for #4 of 5. Pain greatly improved.

## 2012-08-22 NOTE — Assessment & Plan Note (Signed)
Status post Supartz series. Some days are good, some are bad. I'm going to add Voltaren gel. He will continue formal physical therapy.

## 2012-08-22 NOTE — Progress Notes (Signed)
Procedure: Real-time Ultrasound Guided Injection of right knee Device: GE Logiq E  Ultrasound guided injection is preferred based studies that show increased duration, increased effect, greater accuracy, decreased procedural pain, increased response rate, and decreased cost with ultrasound guided versus blind injection.  Verbal informed consent obtained.  Time-out conducted.  Noted no overlying erythema, induration, or other signs of local infection.  Skin prepped in a sterile fashion.  Local anesthesia: Topical Ethyl chloride.  With sterile technique and under real time ultrasound guidance:  25 mg/2.5 mL of Supartz (sodium hyaluronate) in a prefilled syringe was injected easily into the knee through a 22-gauge needle. Completed without difficulty  Pain immediately resolved suggesting accurate placement of the medication.  Advised to call if fevers/chills, erythema, induration, drainage, or persistent bleeding.  Images permanently stored and available for review in the ultrasound unit.  Impression: Technically successful ultrasound guided injection. 

## 2012-08-23 ENCOUNTER — Ambulatory Visit: Payer: Medicare PPO | Admitting: Sports Medicine

## 2012-08-28 ENCOUNTER — Ambulatory Visit (INDEPENDENT_AMBULATORY_CARE_PROVIDER_SITE_OTHER): Payer: Medicare PPO | Admitting: Sports Medicine

## 2012-08-28 VITALS — BP 127/94 | HR 69 | Wt 201.0 lb

## 2012-08-28 DIAGNOSIS — M1711 Unilateral primary osteoarthritis, right knee: Secondary | ICD-10-CM

## 2012-08-28 DIAGNOSIS — IMO0002 Reserved for concepts with insufficient information to code with codable children: Secondary | ICD-10-CM

## 2012-08-28 DIAGNOSIS — M1710 Unilateral primary osteoarthritis, unspecified knee: Secondary | ICD-10-CM

## 2012-08-28 NOTE — Assessment & Plan Note (Signed)
Supartz injection #4 of 5 given today. Continue physical therapy. Return in one week for #5 of 5. Pain greatly improved.

## 2012-08-28 NOTE — Progress Notes (Signed)
Procedure: Real-time Ultrasound Guided aspiration/Injection of right knee Device: GE Logiq E  Ultrasound guided injection is preferred based studies that show increased duration, increased effect, greater accuracy, decreased procedural pain, increased response rate, and decreased cost with ultrasound guided versus blind injection.  Verbal informed consent obtained.  Time-out conducted.  Noted no overlying erythema, induration, or other signs of local infection.  Skin prepped in a sterile fashion.  Local anesthesia: Topical Ethyl chloride.  With sterile technique and under real time ultrasound guidance: 5 cc of lidocaine infiltrated subcutaneously, 18-gauge needle with a 60 cc syringe used to aspirate 10 cc of straw-colored fluid, syringe switched and  25 mg/2.5 mL of Supartz (sodium hyaluronate) in a prefilled syringe was injected easily into the knee through the same 18-gauge needle. Completed without difficulty  Pain immediately resolved suggesting accurate placement of the medication.  Advised to call if fevers/chills, erythema, induration, drainage, or persistent bleeding.  Images permanently stored and available for review in the ultrasound unit.  Impression: Technically successful ultrasound guided injection.

## 2012-08-31 ENCOUNTER — Ambulatory Visit: Payer: Medicare PPO | Admitting: Sports Medicine

## 2012-09-01 ENCOUNTER — Encounter: Payer: Medicare PPO | Admitting: Family Medicine

## 2012-09-05 ENCOUNTER — Encounter: Payer: Self-pay | Admitting: Sports Medicine

## 2012-09-05 ENCOUNTER — Ambulatory Visit (INDEPENDENT_AMBULATORY_CARE_PROVIDER_SITE_OTHER): Payer: Medicare PPO | Admitting: Sports Medicine

## 2012-09-05 VITALS — BP 118/79 | HR 85 | Wt 205.0 lb

## 2012-09-05 DIAGNOSIS — IMO0002 Reserved for concepts with insufficient information to code with codable children: Secondary | ICD-10-CM

## 2012-09-05 DIAGNOSIS — M1712 Unilateral primary osteoarthritis, left knee: Secondary | ICD-10-CM

## 2012-09-05 DIAGNOSIS — M1710 Unilateral primary osteoarthritis, unspecified knee: Secondary | ICD-10-CM

## 2012-09-05 DIAGNOSIS — M1711 Unilateral primary osteoarthritis, right knee: Secondary | ICD-10-CM

## 2012-09-05 NOTE — Assessment & Plan Note (Signed)
Doing well status post Supartz series.

## 2012-09-05 NOTE — Progress Notes (Signed)
Procedure: Real-time Ultrasound Guided Injection of right knee Device: GE Logiq E  Ultrasound guided injection is preferred based studies that show increased duration, increased effect, greater accuracy, decreased procedural pain, increased response rate, and decreased cost with ultrasound guided versus blind injection.  Verbal informed consent obtained.  Time-out conducted.  Noted no overlying erythema, induration, or other signs of local infection.  Skin prepped in a sterile fashion.  Local anesthesia: Topical Ethyl chloride.  With sterile technique and under real time ultrasound guidance:  25 mg/2.5 mL of Supartz (sodium hyaluronate) in a prefilled syringe was injected easily into the knee through a 22-gauge needle. Completed without difficulty  Pain immediately resolved suggesting accurate placement of the medication.  Advised to call if fevers/chills, erythema, induration, drainage, or persistent bleeding.  Images permanently stored and available for review in the ultrasound unit.  Impression: Technically successful ultrasound guided injection.

## 2012-09-05 NOTE — Assessment & Plan Note (Signed)
Supartz 5 of 5 given today. Come back on an as-needed basis

## 2012-09-11 ENCOUNTER — Ambulatory Visit (INDEPENDENT_AMBULATORY_CARE_PROVIDER_SITE_OTHER): Payer: Medicare PPO | Admitting: Family Medicine

## 2012-09-11 ENCOUNTER — Encounter: Payer: Self-pay | Admitting: Family Medicine

## 2012-09-11 VITALS — BP 126/77 | HR 66 | Ht 66.25 in | Wt 208.0 lb

## 2012-09-11 DIAGNOSIS — Z0000 Encounter for general adult medical examination without abnormal findings: Secondary | ICD-10-CM

## 2012-09-11 DIAGNOSIS — N400 Enlarged prostate without lower urinary tract symptoms: Secondary | ICD-10-CM

## 2012-09-11 MED ORDER — TERAZOSIN HCL 1 MG PO CAPS: 1.0000 mg | ORAL_CAPSULE | Freq: Every day | ORAL | Status: DC

## 2012-09-11 NOTE — Progress Notes (Signed)
Subjective:    Todd Parsons is a 67 y.o. male who presents for Medicare Annual/Subsequent preventive examination.   Preventive Screening-Counseling & Management  Tobacco History  Smoking status  . Former Smoker  . Quit date: 04/24/1978  Smokeless tobacco  . Not on file    Problems Prior to Visit Patient Active Problem List  Diagnosis  . Essential hypertension, benign  . Other and unspecified hyperlipidemia  . OSA (obstructive sleep apnea)  . Thoracic aneurysm without mention of rupture  . Congenital insufficiency of aortic valve  . Bicuspid aortic valve  . Vitamin D deficiency  . Hx of arthroscopy of knee  . Osteoarthritis of left knee  . Osteoarthritis of right knee     Current Problems (verified) Patient Active Problem List  Diagnosis  . Essential hypertension, benign  . Other and unspecified hyperlipidemia  . OSA (obstructive sleep apnea)  . Thoracic aneurysm without mention of rupture  . Congenital insufficiency of aortic valve  . Bicuspid aortic valve  . Vitamin D deficiency  . Hx of arthroscopy of knee  . Osteoarthritis of left knee  . Osteoarthritis of right knee    Medications Prior to Visit Current Outpatient Prescriptions on File Prior to Visit  Medication Sig Dispense Refill  . amLODipine (NORVASC) 5 MG tablet Take 1 tablet (5 mg total) by mouth daily.  90 tablet  1  . Capsaicin 0.1 % CREA Apply topically 3-4x daily to knees, avoid face, eyes.    0  . diclofenac sodium (VOLTAREN) 1 % GEL Apply 4 g topically 4 (four) times daily.  40 g  0  . fenofibrate 160 MG tablet Take 1 tablet (160 mg total) by mouth daily.  90 tablet  1  . fluticasone (FLONASE) 50 MCG/ACT nasal spray Place 1 spray into the nose daily.  48 g  1  . lisinopril (PRINIVIL,ZESTRIL) 40 MG tablet Take 1 tablet (40 mg total) by mouth daily.  90 tablet  1  . niacin 500 MG tablet Take 500 mg by mouth daily with breakfast.        . simvastatin (ZOCOR) 20 MG tablet Take 1 tablet (20 mg  total) by mouth at bedtime.  90 tablet  1  . traMADol (ULTRAM) 50 MG tablet Take 1 tablet (50 mg total) by mouth every 8 (eight) hours as needed for pain.  50 tablet  2  . triamcinolone cream (KENALOG) 0.5 % Apply topically 2 (two) times daily. To affected areas.  30 g  3    Current Medications (verified) Current Outpatient Prescriptions  Medication Sig Dispense Refill  . amLODipine (NORVASC) 5 MG tablet Take 1 tablet (5 mg total) by mouth daily.  90 tablet  1  . Capsaicin 0.1 % CREA Apply topically 3-4x daily to knees, avoid face, eyes.    0  . diclofenac sodium (VOLTAREN) 1 % GEL Apply 4 g topically 4 (four) times daily.  40 g  0  . fenofibrate 160 MG tablet Take 1 tablet (160 mg total) by mouth daily.  90 tablet  1  . fluticasone (FLONASE) 50 MCG/ACT nasal spray Place 1 spray into the nose daily.  48 g  1  . lisinopril (PRINIVIL,ZESTRIL) 40 MG tablet Take 1 tablet (40 mg total) by mouth daily.  90 tablet  1  . niacin 500 MG tablet Take 500 mg by mouth daily with breakfast.        . simvastatin (ZOCOR) 20 MG tablet Take 1 tablet (20 mg total) by mouth  at bedtime.  90 tablet  1  . traMADol (ULTRAM) 50 MG tablet Take 1 tablet (50 mg total) by mouth every 8 (eight) hours as needed for pain.  50 tablet  2  . triamcinolone cream (KENALOG) 0.5 % Apply topically 2 (two) times daily. To affected areas.  30 g  3     Allergies (verified) Review of patient's allergies indicates no known allergies.   PAST HISTORY  Family History Family History  Problem Relation Age of Onset  . Heart disease Father 65    bypass surgery  . Stroke Mother 72  . Cancer Mother     upper palate    Social History History  Substance Use Topics  . Smoking status: Former Smoker    Quit date: 04/24/1978  . Smokeless tobacco: Not on file  . Alcohol Use: 1.0 oz/week    2 drink(s) per week    Are there smokers in your home (other than you)?  No  Risk Factors Current exercise habits: none right now  Dietary  issues discussed: none   Cardiac risk factors: advanced age (older than 72 for men, 96 for women), male gender, obesity (BMI >= 30 kg/m2) and sedentary lifestyle.  Depression Screen (Note: if answer to either of the following is "Yes", a more complete depression screening is indicated)   Q1: Over the past two weeks, have you felt down, depressed or hopeless? No  Q2: Over the past two weeks, have you felt little interest or pleasure in doing things? No  Have you lost interest or pleasure in daily life? No  Do you often feel hopeless? No  Do you cry easily over simple problems? No  Activities of Daily Living In your present state of health, do you have any difficulty performing the following activities?:  Driving? No Managing money?  No Feeding yourself? No Getting from bed to chair? No Climbing a flight of stairs? No Preparing food and eating?: No Bathing or showering? No Getting dressed: No Getting to the toilet? No Using the toilet:No Moving around from place to place: No In the past year have you fallen or had a near fall?:No   Are you sexually active?  Yes  Do you have more than one partner?  Yes  Hearing Difficulties: Yes Do you often ask people to speak up or repeat themselves? No Do you experience ringing or noises in your ears? No Do you have difficulty understanding soft or whispered voices? No   Do you feel that you have a problem with memory? No  Do you often misplace items? No  Do you feel safe at home?  No  Cognitive Testing  Alert? Yes  Normal Appearance?Yes  Oriented to person? Yes  Place? Yes   Time? Yes  Recall of three objects?  Yes  Can perform simple calculations? Yes  Displays appropriate judgment?Yes  Can read the correct time from a watch face?Yes   Advanced Directives have been discussed with the patient? Yes   List the Names of Other Physician/Practitioners you currently use: 1.    Indicate any recent Medical Services you may have received  from other than [facility] providers in the past year (date may be approximate).  Immunization History  Administered Date(s) Administered  . Influenza Split 05/19/2012  . Influenza Whole 05/05/2011  . Tdap 08/19/2011    Screening Tests Health Maintenance  Topic Date Due  . Influenza Vaccine  05/07/2013  . Colonoscopy  06/24/2016  . Tetanus/tdap  08/18/2021  . Pneumococcal  Polysaccharide Vaccine Age 55 And Over  Completed  . Zostavax  Completed    All answers were reviewed with the patient and necessary referrals were made:  METHENEY,CATHERINE, MD   09/11/2012   History reviewed: allergies, current medications, past family history, past medical history, past social history, past surgical history and problem list  Review of Systems A comprehensive review of systems was negative.    Objective:     Vision by Snellen chart: right eye:20/20, left eye:20/20 Blood pressure 126/77, pulse 66, height 5' 6.25" (1.683 m), weight 208 lb (94.348 kg), SpO2 97.00%. Body mass index is 33.32 kg/(m^2).  BP 126/77  Pulse 66  Ht 5' 6.25" (1.683 m)  Wt 208 lb (94.348 kg)  BMI 33.32 kg/m2  SpO2 97%  General Appearance:    Alert, cooperative, no distress, appears stated age  Head:    Normocephalic, without obvious abnormality, atraumatic  Eyes:    PERRL, conjunctiva/corneas clear, EOM's intact,  both eyes       Ears:    Normal TM's and external ear canals, both ears  Nose:   Nares normal, septum midline, mucosa normal, no drainage    or sinus tenderness  Throat:   Lips, mucosa, and tongue normal; teeth and gums normal  Neck:   Supple, symmetrical, trachea midline, no adenopathy;       thyroid:  No enlargement/tenderness/nodules; no carotid   bruit or JVD  Back:     Symmetric, no curvature, ROM normal, no CVA tenderness  Lungs:     Clear to auscultation bilaterally, respirations unlabored  Chest wall:    No tenderness or deformity  Heart:    Regular rate and rhythm, S1 and S2 normal, no murmur, rub    or gallop  Abdomen:     Soft, non-tender, bowel sounds active all four quadrants,    no masses, no organomegaly  Genitalia:    Not examined.   Rectal:    Normal tone, 2+ enlarged prostate, no masses or tenderness  Extremities:   Extremities normal, atraumatic, no cyanosis or edema  Pulses:   2+ and symmetric all extremities  Skin:   Skin color, texture, turgor normal, no rashes or lesions  Lymph nodes:   Cervical, supraclavicular, and axillary nodes normal  Neurologic:   CNII-XII intact. Normal strength and reflexes      throughout       Assessment:     Annual Wellness Exam     Plan:     During the course of the visit the patient was educated and counseled about appropriate screening and preventive services including:    Advanced directives: needs to get them formalized  BPH- he is having some decreased force of stream. AUA score of 11 (moderate) sxs.  Discussed treatment options and risk and benefits of treatment.  Will start with terazosin 1mg  at bedtime. F/U in 2 months to f/u.    Diet review for nutrition referral? Yes ____  Not Indicated _x___   Patient Instructions (the written plan) was given to the patient.  Medicare Attestation I have personally reviewed: The patient's medical and social history Their use of alcohol, tobacco or illicit drugs Their current medications and supplements The patient's functional ability including ADLs,fall risks, home safety risks, cognitive, and hearing and visual impairment Diet and physical activities Evidence for depression or mood disorders  The patient's weight, height, BMI, and visual acuity have been recorded in the chart.  I have made referrals, counseling, and provided education to the patient  based on review of the above and I have provided the patient with a written personalized care plan for preventive services.     METHENEY,CATHERINE, MD   09/11/2012

## 2012-09-11 NOTE — Patient Instructions (Addendum)
Keep up a regular exercise program and make sure you are eating a healthy diet Try to eat 4 servings of dairy a day, or if you are lactose intolerant take a calcium with vitamin D daily.  Your vaccines are up to date.   

## 2012-11-28 ENCOUNTER — Telehealth: Payer: Self-pay | Admitting: Family Medicine

## 2012-11-28 ENCOUNTER — Encounter: Payer: Self-pay | Admitting: Family Medicine

## 2012-11-28 ENCOUNTER — Ambulatory Visit (INDEPENDENT_AMBULATORY_CARE_PROVIDER_SITE_OTHER): Payer: Medicare PPO | Admitting: Family Medicine

## 2012-11-28 VITALS — BP 116/65 | HR 73 | Ht 67.0 in | Wt 210.0 lb

## 2012-11-28 DIAGNOSIS — M25569 Pain in unspecified knee: Secondary | ICD-10-CM

## 2012-11-28 DIAGNOSIS — N400 Enlarged prostate without lower urinary tract symptoms: Secondary | ICD-10-CM

## 2012-11-28 DIAGNOSIS — I10 Essential (primary) hypertension: Secondary | ICD-10-CM

## 2012-11-28 DIAGNOSIS — M25562 Pain in left knee: Secondary | ICD-10-CM

## 2012-11-28 DIAGNOSIS — IMO0002 Reserved for concepts with insufficient information to code with codable children: Secondary | ICD-10-CM

## 2012-11-28 DIAGNOSIS — M25561 Pain in right knee: Secondary | ICD-10-CM

## 2012-11-28 DIAGNOSIS — M1710 Unilateral primary osteoarthritis, unspecified knee: Secondary | ICD-10-CM

## 2012-11-28 MED ORDER — TRAMADOL HCL 50 MG PO TABS: 50.0000 mg | ORAL_TABLET | Freq: Three times a day (TID) | ORAL | Status: DC | PRN

## 2012-11-28 MED ORDER — TERAZOSIN HCL 2 MG PO CAPS: 2.0000 mg | ORAL_CAPSULE | Freq: Every day | ORAL | Status: DC

## 2012-11-28 NOTE — Telephone Encounter (Signed)
I did speak with my partner Dr. Rodney Langton, who felt that he did have some arthritis but not typically enough for a knee replacement. Though he recommended he might be a good candidate for at least a knee arthroscopy to go in and see if there's any significant cartilage damage that could be corrected or cleaned out.

## 2012-11-28 NOTE — Progress Notes (Signed)
  Subjective:    Patient ID: Todd Parsons, male    DOB: 1946/07/08, 67 y.o.   MRN: [id number]  HPI BPH - tolerating terazosin well. No SE, including lightheadedness or dizziness..  BP well contrlled. He says it has helped to control his symptoms. He says a little improvement in his force of stream. He feels like he is emptying his bladder a little better and is only getting up once at night. He would like to increase his dose.  OA of bilat knees - says uses aleve ot control his pain and does help. Worse when sits for a period. Acutally feels better with movement. He did PT for knees and has had supartz as well as steroid injections. He still hurts on a daily basis and feels like he is using way too much relief. He says he has to take at least once a day. Occasionally he will use Tylenol at bedtime if he has difficulty falling asleep..  Cool air causes his knees to ache.    HTN-  Pt denies chest pain, SOB, dizziness, or heart palpitations.  Taking meds as directed w/o problems.  Denies medication side effects.    Review of Systems     Objective:   Physical Exam  Constitutional: He is oriented to person, place, and time. He appears well-developed and well-nourished.  HENT:  Head: Normocephalic and atraumatic.  Cardiovascular: Normal rate, regular rhythm and normal heart sounds.   Pulmonary/Chest: Effort normal and breath sounds normal.  Neurological: He is alert and oriented to person, place, and time.  Skin: Skin is warm and dry.  Psychiatric: He has a normal mood and affect. His behavior is normal.          Assessment & Plan:  OA of knees. -Pain is not well controlled. We discussed continuing Aleve as his mainstay of therapy that he can certainly take with Tylenol or even alternate with Tylenol. There have been some pain study showing improvement by adding Tylenol to an NSAID for additional pain relief. I also gave him a prescription for tramadol to use only as needed as a backup  medication for more severe pain. I really want to be very careful about starting narcotics for chronic pain management. I will also speak to my partner Dr. Rodney Langton who performed the steroid and Supartz injections to see if he feels like he is at the point of being a surgical candidate for knee replacement. Though he says he is not really excited about the prospect but if he does have bone on bone he is willing to consider it. Addendum: I did speak with my partner Dr. Rodney Langton, who felt that he did have some arthritis but not typically enough for a knee replacement. Though he recommended he might be a good candidate for at least an arthroscopy to go in and see if there's any significant cartilage damage that could be corrected or cleaned out.  HTN - well controlled. Continue current regimen.  BPH - will inc terazosine to 2mg .  Mnitor for low BP . He does have a cuff at home. Blood pressures are going too low he started to feel lightheaded or dizzy then we might need to cut his lisinopril back. Followup in 6-8 weeks.

## 2012-11-29 NOTE — Telephone Encounter (Signed)
Called and told pt of suggestion. He wanted to know if this can be done bilaterally? I told him that I wasn't sure that I would forward his question back to Dr. Linford Arnold and once I get her response I would call him back with an answer. Pt voiced understanding and agreed. Forwarded to pcp for advice.Loralee Pacas [place]

## 2012-11-29 NOTE — Telephone Encounter (Signed)
That would be up to the surgeon.  I really can't snswer that. More than likely would only do one at a time

## 2012-11-29 NOTE — Telephone Encounter (Signed)
Called pt and lvm telling him that Dr. Linford Arnold stated that this would be up to the surgeon and that more than likely they would only do one at a time.Loralee Pacas [place]

## 2012-12-01 ENCOUNTER — Ambulatory Visit: Payer: Medicare PPO | Admitting: Family Medicine

## 2013-01-30 ENCOUNTER — Ambulatory Visit (INDEPENDENT_AMBULATORY_CARE_PROVIDER_SITE_OTHER): Payer: Medicare PPO | Admitting: Family Medicine

## 2013-01-30 ENCOUNTER — Encounter: Payer: Self-pay | Admitting: Family Medicine

## 2013-01-30 VITALS — BP 125/68 | HR 62 | Wt 208.0 lb

## 2013-01-30 DIAGNOSIS — I10 Essential (primary) hypertension: Secondary | ICD-10-CM

## 2013-01-30 DIAGNOSIS — Q231 Congenital insufficiency of aortic valve: Secondary | ICD-10-CM

## 2013-01-30 DIAGNOSIS — M25561 Pain in right knee: Secondary | ICD-10-CM

## 2013-01-30 DIAGNOSIS — M25562 Pain in left knee: Secondary | ICD-10-CM

## 2013-01-30 DIAGNOSIS — M25569 Pain in unspecified knee: Secondary | ICD-10-CM

## 2013-01-30 DIAGNOSIS — E785 Hyperlipidemia, unspecified: Secondary | ICD-10-CM

## 2013-01-30 MED ORDER — AMLODIPINE BESYLATE 5 MG PO TABS: 5.0000 mg | ORAL_TABLET | Freq: Every day | ORAL | Status: DC

## 2013-01-30 MED ORDER — SIMVASTATIN 20 MG PO TABS: 20.0000 mg | ORAL_TABLET | Freq: Every day | ORAL | Status: DC

## 2013-01-30 MED ORDER — FENOFIBRATE 160 MG PO TABS: 160.0000 mg | ORAL_TABLET | Freq: Every day | ORAL | Status: DC

## 2013-01-30 MED ORDER — TERAZOSIN HCL 2 MG PO CAPS: 2.0000 mg | ORAL_CAPSULE | Freq: Every day | ORAL | Status: DC

## 2013-01-30 MED ORDER — LISINOPRIL 40 MG PO TABS: 40.0000 mg | ORAL_TABLET | Freq: Every day | ORAL | Status: DC

## 2013-01-30 NOTE — Progress Notes (Addendum)
  Subjective:    Patient ID: Todd Parsons, male    DOB: May 12, 1946, 67 y.o.   MRN: [id number]  HPI HTN -  Pt denies chest pain, SOB, dizziness, or heart palpitations.  Taking meds as directed w/o problems.  Denies medication side effects.  No regular exercise.  He went back to work.   Hyperlipidemia-on simvastatin. Tolerating it well without any side effects. Also on fenofibrate. Lab Results  Component Value Date   CHOL 163 05/24/2012   HDL 34* 05/24/2012   LDLCALC 91 05/24/2012   TRIG 191* 05/24/2012   CHOLHDL 3.6 08/22/2001   Bilateral knee pain - Has been taking aleve, IBUprofen. Say breeze blowing on his knees is painful.  Has seen Dr. Karie Schwalbe and had suparz injections. Has been using tramadol occ if knows he is doing a lot. Says the warm weather this summer.     Review of Systems     Objective:   Physical Exam  Constitutional: He is oriented to person, place, and time. He appears well-developed and well-nourished.  HENT:  Head: Normocephalic and atraumatic.  Cardiovascular: Normal rate and regular rhythm.   2/6 SEM best heard at the left sternal border   Pulmonary/Chest: Effort normal and breath sounds normal.  Neurological: He is alert and oriented to person, place, and time.  Skin: Skin is warm and dry.  Psychiatric: He has a normal mood and affect. His behavior is normal.          Assessment & Plan:  HTN - well controlled.  F/u in 6 months.  Check BMP today.   Hyperlidpemia - No regular exercise. Recheck in September.   Bilat knee pain - discussed optoins. He feels slightly tender is his worst time. I encouraged him to try to get through the summer and if we get to the winter and he starts having significant increase in pain on a daily basis then can consider referral to an orthopedic surgeon for further evaluation and treatment. He is Re: had full Supartz series.   Thoracic aorta and congenital insufficiency of the aortic valve-follows regularly with his cardiologist.

## 2013-02-01 LAB — BASIC METABOLIC PANEL WITH GFR
BUN: 21 mg/dL (ref 6–23)
Chloride: 103 mEq/L (ref 96–112)
GFR, Est African American: 71 mL/min
GFR, Est Non African American: 62 mL/min
Potassium: 4 mEq/L (ref 3.5–5.3)
Sodium: 137 mEq/L (ref 135–145)

## 2013-02-01 NOTE — Progress Notes (Signed)
Quick Note:  All labs are normal. ______ 

## 2013-02-21 ENCOUNTER — Other Ambulatory Visit: Payer: Self-pay | Admitting: Family Medicine

## 2013-05-31 ENCOUNTER — Encounter: Payer: Self-pay | Admitting: Family Medicine

## 2013-06-20 ENCOUNTER — Encounter: Payer: Self-pay | Admitting: Family Medicine

## 2013-07-12 ENCOUNTER — Other Ambulatory Visit: Payer: Self-pay

## 2013-07-27 ENCOUNTER — Ambulatory Visit (INDEPENDENT_AMBULATORY_CARE_PROVIDER_SITE_OTHER): Payer: Medicare PPO | Admitting: Family Medicine

## 2013-07-27 ENCOUNTER — Encounter: Payer: Self-pay | Admitting: Family Medicine

## 2013-07-27 VITALS — BP 115/70 | HR 82 | Wt 190.0 lb

## 2013-07-27 DIAGNOSIS — Z23 Encounter for immunization: Secondary | ICD-10-CM

## 2013-07-27 DIAGNOSIS — I10 Essential (primary) hypertension: Secondary | ICD-10-CM

## 2013-07-27 DIAGNOSIS — E785 Hyperlipidemia, unspecified: Secondary | ICD-10-CM

## 2013-07-27 MED ORDER — FLUTICASONE PROPIONATE 50 MCG/ACT NA SUSP: 1.0000 | Freq: Every day | NASAL | Status: DC

## 2013-07-27 MED ORDER — TERAZOSIN HCL 2 MG PO CAPS: 2.0000 mg | ORAL_CAPSULE | Freq: Every day | ORAL | Status: DC

## 2013-07-27 MED ORDER — TRAMADOL HCL 50 MG PO TABS: ORAL_TABLET | ORAL | Status: DC

## 2013-07-27 MED ORDER — SIMVASTATIN 20 MG PO TABS: 20.0000 mg | ORAL_TABLET | Freq: Every day | ORAL | Status: DC

## 2013-07-27 MED ORDER — SIMVASTATIN 40 MG PO TABS: 40.0000 mg | ORAL_TABLET | Freq: Every day | ORAL | Status: DC

## 2013-07-27 MED ORDER — AMLODIPINE BESYLATE 5 MG PO TABS: 5.0000 mg | ORAL_TABLET | Freq: Every day | ORAL | Status: DC

## 2013-07-27 MED ORDER — FENOFIBRATE 160 MG PO TABS: 160.0000 mg | ORAL_TABLET | Freq: Every day | ORAL | Status: DC

## 2013-07-27 MED ORDER — LISINOPRIL 40 MG PO TABS: 40.0000 mg | ORAL_TABLET | Freq: Every day | ORAL | Status: DC

## 2013-07-27 NOTE — Progress Notes (Signed)
  Subjective:    Patient ID: Todd Parsons, male    DOB: [birth year], 67 y.o.   MRN: [id number]  HPI HTN- Pt denies chest pain, SOB, dizziness, or heart palpitations.  Taking meds as directed w/o problems.  Denies medication side effects.  Saw cardiologist aobut a month ago.  CT showed stable.   Hyperlipidemian - tolerating medication well. No S.E.   Review of Systems     Objective:   Physical Exam  Constitutional: He is oriented to person, place, and time. He appears well-developed and well-nourished.  HENT:  Head: Normocephalic and atraumatic.  Cardiovascular: Normal rate, regular rhythm and normal heart sounds.   Pulmonary/Chest: Effort normal and breath sounds normal.  Neurological: He is alert and oriented to person, place, and time.  Skin: Skin is warm and dry.  Psychiatric: He has a normal mood and affect. His behavior is normal.          Assessment & Plan:  HTN- Due for CMP and lipids.  Well controlled.    Hyperlipidemia-tolerating statin well. We'll send a prescription for refills. Recheck lipid levels. We discussed increasing his dose to 40 mg as nor study showed a moderate to statins tend to have the morbidity mortality benefits versus low-dose statins. He is willing to try a 40 mg tab. If he starts having side effects or problems such as myalgias then he can cut a tab in half.  Flu shot given.

## 2013-07-30 LAB — COMPLETE METABOLIC PANEL WITH GFR
ALT: 17 U/L (ref 0–53)
AST: 21 U/L (ref 0–37)
BUN: 21 mg/dL (ref 6–23)
CO2: 28 mEq/L (ref 19–32)
Calcium: 10.1 mg/dL (ref 8.4–10.5)
Chloride: 106 mEq/L (ref 96–112)
Creat: 1.28 mg/dL (ref 0.50–1.35)
GFR, Est African American: 66 mL/min
Sodium: 140 mEq/L (ref 135–145)
Total Protein: 7 g/dL (ref 6.0–8.3)

## 2013-07-30 LAB — LIPID PANEL
HDL: 43 mg/dL (ref >39)
LDL Cholesterol: 60 mg/dL (ref 0–99)
Triglycerides: 79 mg/dL (ref <150)
VLDL: 16 mg/dL (ref 0–40)

## 2013-07-31 ENCOUNTER — Ambulatory Visit: Payer: Medicare PPO | Admitting: Family Medicine

## 2013-07-31 NOTE — Progress Notes (Signed)
Quick Note:  All labs are normal. ______ 

## 2013-08-21 ENCOUNTER — Telehealth: Payer: Self-pay | Admitting: *Deleted

## 2013-08-21 NOTE — Telephone Encounter (Signed)
Pt informed of last tdap.Loralee Pacas [place]

## 2013-09-17 ENCOUNTER — Ambulatory Visit (INDEPENDENT_AMBULATORY_CARE_PROVIDER_SITE_OTHER): Payer: Medicare PPO | Admitting: Family Medicine

## 2013-09-17 ENCOUNTER — Encounter: Payer: Self-pay | Admitting: Family Medicine

## 2013-09-17 VITALS — BP 113/73 | HR 88 | Temp 97.7°F | Wt 200.0 lb

## 2013-09-17 DIAGNOSIS — A499 Bacterial infection, unspecified: Secondary | ICD-10-CM

## 2013-09-17 DIAGNOSIS — J329 Chronic sinusitis, unspecified: Secondary | ICD-10-CM

## 2013-09-17 DIAGNOSIS — B9689 Other specified bacterial agents as the cause of diseases classified elsewhere: Secondary | ICD-10-CM

## 2013-09-17 MED ORDER — AMOXICILLIN-POT CLAVULANATE 500-125 MG PO TABS: ORAL_TABLET | ORAL | Status: DC

## 2013-09-17 NOTE — Progress Notes (Signed)
CC: Todd Parsons is a 68 y.o. male is here for congetion   Subjective: HPI:  Patient went to 7 days of mild facial pressure localizes beneath the eyes and above the eyes bilateral accompanied by nasal congestion and nonproductive cough. Symptoms are present all hours of the day nothing particularly makes worse. Symptoms are improved with   afrin. Accompanied by mild body aches and 2 episodes of mild night sweats over the past week. Denies subjective fever or chills. Denies motor or sensory disturbances, chest pain, shortness of breath, wheezing, nor sore throat  Review Of Systems Outlined In HPI  Past Medical History  Diagnosis Date  . Hypertension   . Hyperlipidemia   . Echocardiogram with ECG monitoring 01/12/2010    60-65% EF     Family History  Problem Relation Age of Onset  . Heart disease Father 26    bypass surgery  . Stroke Mother 33  . Cancer Mother     upper palate     History  Substance Use Topics  . Smoking status: Former Smoker    Quit date: 04/24/1978  . Smokeless tobacco: Not on file  . Alcohol Use: 1.0 oz/week    2 drink(s) per week     Objective: Filed Vitals:   09/17/13 1033  BP: 113/73  Pulse: 88  Temp: 97.7 F (36.5 C)    General: Alert and Oriented, No Acute Distress HEENT: Pupils equal, round, reactive to light. Conjunctivae clear.  External ears unremarkable, canals clear with intact TMs with appropriate landmarks.  Middle ear appears open without effusion. Pink inferior turbinates with moderate mucoid discharge.  Moist mucous membranes, pharynx without inflammation nor lesions.  Neck supple without palpable lymphadenopathy nor abnormal masses. Lungs: Clear to auscultation bilaterally, no wheezing/ronchi/rales.  Comfortable work of breathing. Good air movement. Mental Status: No depression, anxiety, nor agitation. Skin: Warm and dry.  Assessment & Plan: Todd Parsons was seen today for congetion.  Diagnoses and associated orders for this  visit:  Bacterial sinusitis - amoxicillin-clavulanate (AUGMENTIN) 500-125 MG per tablet; Take one by mouth every 8 hours for ten total days.    Viral Sinusitis: Discussed that at this stage his symptoms are all still most likely due to viral sinusitis, if symptoms persist to a 10 day period then I would recommend starting Augmentin however at this time he doesn't necessarily need to start. Nasal saline washes and Alka-Seltzer cold and sinus as needed  Return if symptoms worsen or fail to improve.

## 2013-09-20 ENCOUNTER — Encounter: Payer: Self-pay | Admitting: Family Medicine

## 2013-09-21 ENCOUNTER — Telehealth: Payer: Self-pay | Admitting: Family Medicine

## 2013-09-21 MED ORDER — DOXYCYCLINE HYCLATE 100 MG PO TABS: ORAL_TABLET | ORAL | Status: AC

## 2013-09-21 NOTE — Telephone Encounter (Signed)
Spoke with pt this am and already faxed rx to [facility]

## 2013-09-21 NOTE — Telephone Encounter (Signed)
Seth Bake, Will you please let Mr. Ausmus know that an alternative to generic augmentin is doxycycline which I've printed off for him to pick up or be faxed off.

## 2014-01-21 ENCOUNTER — Encounter: Payer: Self-pay | Admitting: Family Medicine

## 2014-01-21 ENCOUNTER — Ambulatory Visit (INDEPENDENT_AMBULATORY_CARE_PROVIDER_SITE_OTHER): Payer: Medicare PPO | Admitting: Family Medicine

## 2014-01-21 VITALS — BP 108/71 | HR 77 | Wt 183.0 lb

## 2014-01-21 DIAGNOSIS — M1711 Unilateral primary osteoarthritis, right knee: Secondary | ICD-10-CM

## 2014-01-21 DIAGNOSIS — IMO0002 Reserved for concepts with insufficient information to code with codable children: Secondary | ICD-10-CM

## 2014-01-21 DIAGNOSIS — R7309 Other abnormal glucose: Secondary | ICD-10-CM

## 2014-01-21 DIAGNOSIS — M1710 Unilateral primary osteoarthritis, unspecified knee: Secondary | ICD-10-CM

## 2014-01-21 DIAGNOSIS — F429 Obsessive-compulsive disorder, unspecified: Secondary | ICD-10-CM

## 2014-01-21 DIAGNOSIS — I10 Essential (primary) hypertension: Secondary | ICD-10-CM

## 2014-01-21 DIAGNOSIS — M1712 Unilateral primary osteoarthritis, left knee: Secondary | ICD-10-CM

## 2014-01-21 LAB — POCT GLYCOSYLATED HEMOGLOBIN (HGB A1C): HEMOGLOBIN A1C: 5.6

## 2014-01-21 MED ORDER — LISINOPRIL 40 MG PO TABS: 40.0000 mg | ORAL_TABLET | Freq: Every day | ORAL | Status: DC

## 2014-01-21 MED ORDER — PROPRANOLOL HCL 10 MG PO TABS: 10.0000 mg | ORAL_TABLET | Freq: Every day | ORAL | Status: DC | PRN

## 2014-01-21 MED ORDER — SIMVASTATIN 40 MG PO TABS: 40.0000 mg | ORAL_TABLET | Freq: Every day | ORAL | Status: DC

## 2014-01-21 MED ORDER — FLUTICASONE PROPIONATE 50 MCG/ACT NA SUSP: 1.0000 | Freq: Every day | NASAL | Status: DC

## 2014-01-21 MED ORDER — AMLODIPINE BESYLATE 5 MG PO TABS: 5.0000 mg | ORAL_TABLET | Freq: Every day | ORAL | Status: DC

## 2014-01-21 MED ORDER — TERAZOSIN HCL 2 MG PO CAPS: 2.0000 mg | ORAL_CAPSULE | Freq: Every day | ORAL | Status: DC

## 2014-01-21 MED ORDER — FENOFIBRATE 160 MG PO TABS: 160.0000 mg | ORAL_TABLET | Freq: Every day | ORAL | Status: DC

## 2014-01-21 NOTE — Progress Notes (Signed)
   Subjective:    Patient ID: Todd Parsons, male    DOB: 1946/03/20, 68 y.o.   MRN: [id number]  HPI Hypertension- Pt denies chest pain, SOB, dizziness, or heart palpitations.  Taking meds as directed w/o problems.  Denies medication side effects.    He is in a study at [facility] and lost some weight. He is not exercising right now.  Has lost about 20 lbs.    Bilateral knee pain - Says he is ready to see ortho for knee replacment. Says the last 2 years have been very difficult and has started to affect his quality of life.  Has had arthroscopy on the left knee pain.   Says he is very obsessive compulsive and his wife wanted him to come in and talk about it.  Says every little piece has to be off a seal.  Likes thiks neat and in their place and organized. Now that he is retired and home.  When gets around alto fo family he feels like his anxiety goes up tremedously   Review of Systems     Objective:   Physical Exam  Constitutional: He is oriented to person, place, and time. He appears well-developed and well-nourished.  HENT:  Head: Normocephalic and atraumatic.  Neck: Neck supple. No thyromegaly present.  Cardiovascular: Normal rate, regular rhythm and normal heart sounds.   Pulmonary/Chest: Effort normal and breath sounds normal.  Lymphadenopathy:    He has no cervical adenopathy.  Neurological: He is alert and oriented to person, place, and time.  Skin: Skin is warm and dry.  Psychiatric: He has a normal mood and affect. His behavior is normal.          Assessment & Plan:  HTN - well controlled.  Check BMP. Continue current regimen. Followup in 6 months.  bilat knee pain - he is ready for referral to an orthopedist for possible knee replacement. I really think he is at the point where has affected his quality of life enough that he should go ahead and move forward with this.  Obsessive-compulsive disorder-we'll discuss different options. Behavioral therapy versus daily  medication. He would like to have something just use as needed especially if he is going to be around family. He has used propranolol in the past and this worked well for him. He says it's been years ago. We'll start with 10 mg and can take 2 tabs if needed. Please let me know and we can adjust the dose.  Abnormal glucose- we'll check hemoglobin A1c to rule out DM or impaired fasting glucose. Normal.   Lab Results  Component Value Date   HGBA1C 5.6 01/21/2014

## 2014-01-21 NOTE — Patient Instructions (Signed)
Can try 1 or 2 tabs of propranolol as needed for increased stress/anxiety. Just make sure to cut her lisinopril and half on days where you're going to take the propranolol so that it doesn't drop her blood pressure too low.

## 2014-01-22 LAB — BASIC METABOLIC PANEL WITH GFR
BUN: 26 mg/dL — ABNORMAL HIGH (ref 6–23)
CO2: 28 mEq/L (ref 19–32)
Calcium: 9.7 mg/dL (ref 8.4–10.5)
Chloride: 104 mEq/L (ref 96–112)
Creat: 1.12 mg/dL (ref 0.50–1.35)
GFR, Est African American: 78 mL/min
GFR, Est Non African American: 67 mL/min
Glucose, Bld: 90 mg/dL (ref 70–99)
Potassium: 4.8 mEq/L (ref 3.5–5.3)
Sodium: 138 mEq/L (ref 135–145)

## 2014-01-23 NOTE — Progress Notes (Signed)
Quick Note:  All labs are normal. ______ 

## 2014-01-24 ENCOUNTER — Ambulatory Visit: Payer: Medicare PPO | Admitting: Family Medicine

## 2014-02-06 NOTE — Telephone Encounter (Signed)
error 

## 2014-02-28 ENCOUNTER — Ambulatory Visit (INDEPENDENT_AMBULATORY_CARE_PROVIDER_SITE_OTHER): Payer: Medicare PPO | Admitting: Family Medicine

## 2014-02-28 ENCOUNTER — Encounter: Payer: Self-pay | Admitting: Family Medicine

## 2014-02-28 VITALS — BP 106/59 | HR 59 | Ht 67.0 in | Wt 178.0 lb

## 2014-02-28 DIAGNOSIS — M1712 Unilateral primary osteoarthritis, left knee: Secondary | ICD-10-CM

## 2014-02-28 DIAGNOSIS — D239 Other benign neoplasm of skin, unspecified: Secondary | ICD-10-CM

## 2014-02-28 DIAGNOSIS — M1711 Unilateral primary osteoarthritis, right knee: Secondary | ICD-10-CM

## 2014-02-28 DIAGNOSIS — D229 Melanocytic nevi, unspecified: Secondary | ICD-10-CM

## 2014-02-28 DIAGNOSIS — I10 Essential (primary) hypertension: Secondary | ICD-10-CM

## 2014-02-28 DIAGNOSIS — M1710 Unilateral primary osteoarthritis, unspecified knee: Secondary | ICD-10-CM

## 2014-02-28 DIAGNOSIS — L989 Disorder of the skin and subcutaneous tissue, unspecified: Secondary | ICD-10-CM

## 2014-02-28 NOTE — Progress Notes (Signed)
   Subjective:    Patient ID: Todd Parsons, male    DOB: 1946-02-01, 68 y.o.   MRN: [id number]  HPI Mole on the left bicpe that is brown. Has been changing in size and color over the last couple of months. Not been painful or tender or draining. He also has a small red spot on the underarms of the left upper arm as well. He says it's been there for couple weeks and just won't heal and he's not sure why.  Hypertension-he's actually been splitting his propranolol and his lisinopril because of low blood pressures. His mood is actually been much better on the propranolol.  He would like to get another x-ray on his knee standing instead of lying. He did have him start injections into both knees recently with Dr. Annie Main pill in that has been helpful. At Dr. pill had 1 x-rays were he was standing and he would like to get this done downstairs.  Review of Systems     Objective:   Physical Exam  Constitutional: He appears well-developed and well-nourished.  HENT:  Head: Normocephalic and atraumatic.  Skin: Skin is warm and dry.  5 x 7 mm brown pigmented lesion on left upper arm over the biceps area. It's very irregular and is darker in the center. Maybe some form of seborrheic keratosis. The it's only papular in the Center. On the left upper arm medially he has an erythematous pinpoint-type scab is not healing well. It's approximately 2 mm in size.  Psychiatric: He has a normal mood and affect. His behavior is normal.          Assessment & Plan:  Atypical nevus-recommend shave biopsy for further evaluation. We'll call with results was reported that.  Hypertension-very well controlled. Continue current regimen. Once he runs out of his current medication let me know and we can decrease the lisinopril to 20 mg tabs but he doesn't have to split the 40 mg tab. We can also adjust the propranolol as well.   Osteoarthritis knee, bilateral-will get x-ray films today.  Shave Biopsy Procedure  Note  Pre-operative Diagnosis: Suspicious lesion  Post-operative Diagnosis: concerning for atypical nevus and AK  Locations:right upper bicpe exteranl arm lesion is consistant with atypical nevus.  Lesoin on interior of left upper arm appears to be an irritated AK   Indications: change in size and color. Poor healing.   Anesthesia: Lidocaine 1% with epinephrine    Procedure Details  Patient informed of the risks (including bleeding and infection) and benefits of the  procedure and Verbal informed consent obtained.  The lesion and surrounding area were given a sterile prep using chlorhexidine and draped in the usual sterile fashion. A scalpel was used to shave an area of skin approximately 1. 1 cm on external ar.  0.8 x 9.8 in interior arm.  Hemostasis achieved with alumuninum chloride. Antibiotic ointment and a sterile dressing applied.  The specimen was sent for pathologic examination. The patient tolerated the procedure well.  EBL: 0 ml  Findings: Await pathology  Condition: Stable  Complications: none.  Plan: 1. Instructed to keep the wound dry and covered for 24-48h and clean thereafter. 2. Warning signs of infection were reviewed.   3. Recommended that the patient use OTC acetaminophen as needed for pain.  4. Return in PRN  .

## 2014-02-28 NOTE — Patient Instructions (Addendum)
Please keep the bandage on until tomorrow morning. If it's has some drainage on the bandage it can be changed to today. Okay to take a regular shower. Do not apply alcohol or peroxide to the wound. Apply just a tab of Vaseline daily for one week, and keep covered with a Band-Aid for couple days if needed.

## 2014-04-16 ENCOUNTER — Ambulatory Visit (INDEPENDENT_AMBULATORY_CARE_PROVIDER_SITE_OTHER): Payer: Commercial Managed Care - HMO | Admitting: Family Medicine

## 2014-04-16 ENCOUNTER — Encounter: Payer: Self-pay | Admitting: Family Medicine

## 2014-04-16 VITALS — BP 116/82 | HR 74 | Wt 171.0 lb

## 2014-04-16 DIAGNOSIS — S29012A Strain of muscle and tendon of back wall of thorax, initial encounter: Secondary | ICD-10-CM

## 2014-04-16 DIAGNOSIS — S239XXA Sprain of unspecified parts of thorax, initial encounter: Secondary | ICD-10-CM

## 2014-04-16 MED ORDER — MELOXICAM 15 MG PO TABS: 15.0000 mg | ORAL_TABLET | Freq: Every day | ORAL | Status: DC

## 2014-04-16 NOTE — Progress Notes (Signed)
CC: Todd Parsons is a 68 y.o. male is here for left shoulder pain   Subjective: HPI:  Complains of left upper back pain present for the past 2 weeks that has not been getting better or worse since gradual onset. Symptoms came on weeks after he began a new upper extremity weight lifting regimen. It is described as a soreness and tightness just medial to the shoulder blade close to the surface and nonradiating. Mild in severity improved with massages nothing else makes better or worse. Denies shortness of breath, cough, motor or sensory disturbances, overlying skin changes, painful breathing, wheezing, cough, nor exertional chest pain   Review Of Systems Outlined In HPI  Past Medical History  Diagnosis Date  . Hypertension   . Hyperlipidemia   . Echocardiogram with ECG monitoring 01/12/2010    60-65% EF    Past Surgical History  Procedure Laterality Date  . Left knee arthoscopy    . Apnea device implant      then removed.    . Tonsilectomy, adenoidectomy, bilateral myringotomy and tubes     Family History  Problem Relation Age of Onset  . Heart disease Father 48    bypass surgery  . Stroke Mother 33  . Cancer Mother     upper palate    History   Social History  . Marital Status: Married    Spouse Name: N/A    Number of Children: N/A  . Years of Education: N/A   Occupational History  . Not on file.   Social History Main Topics  . Smoking status: Former Smoker    Quit date: 04/24/1978  . Smokeless tobacco: Not on file  . Alcohol Use: 1.0 oz/week    2 drink(s) per week  . Drug Use: No  . Sexual Activity: Not on file   Other Topics Concern  . Not on file   Social History Narrative   Some regular exercise.      Objective: BP 116/82  Pulse 74  Wt 171 lb (77.565 kg)  General: Alert and Oriented, No Acute Distress HEENT: Pupils equal, round, reactive to light. Conjunctivae clear.  Moist membranes Lungs: Clear to auscultation bilaterally, no wheezing/ronchi/rales.   Comfortable work of breathing. Good air movement. Cardiac: Regular rate and rhythm. Normal S1/S2.  No murmurs, rubs, nor gallops.   Left shoulder exam reveals full range of motion and strength in all planes of motion and with individual rotator cuff testing. No overlying redness warmth or swelling.  Neer's test negative.  Hawkins test negative. Empty can negative. Crossarm test negative. O'Brien's test negative. Apprehension test negative. Speed's test negative. Pain is reproduced with palpation of the left rhomboid. No scapular winging bilaterally Extremities: No peripheral edema.  Strong peripheral pulses.  Mental Status: No depression, anxiety, nor agitation. Skin: Warm and dry.  Assessment & Plan: Todd Parsons was seen today for left shoulder pain.  Diagnoses and associated orders for this visit:  Rhomboid muscle strain, initial encounter - meloxicam (MOBIC) 15 MG tablet; Take 1 tablet (15 mg total) by mouth daily.    I've recommended that he start meloxicam any home exercise regimen specific for rehabilitation of a rhomboid strain, handout with instructions were provided, to be performed on a daily basis for the next 2-3 weeks. Stop upper extremity strength training until pain is completely gone   Return if symptoms worsen or fail to improve.

## 2014-05-17 ENCOUNTER — Encounter: Payer: Self-pay | Admitting: Family Medicine

## 2014-05-17 ENCOUNTER — Other Ambulatory Visit: Payer: Self-pay | Admitting: *Deleted

## 2014-05-17 DIAGNOSIS — I359 Nonrheumatic aortic valve disorder, unspecified: Secondary | ICD-10-CM

## 2014-05-17 DIAGNOSIS — I712 Thoracic aortic aneurysm, without rupture, unspecified: Secondary | ICD-10-CM

## 2014-05-17 DIAGNOSIS — Q231 Congenital insufficiency of aortic valve: Secondary | ICD-10-CM

## 2014-05-17 DIAGNOSIS — I10 Essential (primary) hypertension: Secondary | ICD-10-CM

## 2014-05-21 ENCOUNTER — Encounter: Payer: Self-pay | Admitting: Family Medicine

## 2014-05-27 ENCOUNTER — Other Ambulatory Visit: Payer: Self-pay

## 2014-05-27 ENCOUNTER — Encounter: Payer: Self-pay | Admitting: Family Medicine

## 2014-05-27 DIAGNOSIS — F429 Obsessive-compulsive disorder, unspecified: Secondary | ICD-10-CM

## 2014-05-27 MED ORDER — FLUTICASONE PROPIONATE 50 MCG/ACT NA SUSP: 2.0000 | Freq: Every day | NASAL | Status: DC

## 2014-05-27 MED ORDER — PROPRANOLOL HCL 10 MG PO TABS: 10.0000 mg | ORAL_TABLET | Freq: Every day | ORAL | Status: DC | PRN

## 2014-07-24 ENCOUNTER — Encounter: Payer: Self-pay | Admitting: Family Medicine

## 2014-07-24 ENCOUNTER — Ambulatory Visit (INDEPENDENT_AMBULATORY_CARE_PROVIDER_SITE_OTHER): Payer: Commercial Managed Care - HMO | Admitting: Family Medicine

## 2014-07-24 VITALS — BP 98/55 | HR 69 | Temp 98.4°F | Ht 67.0 in | Wt 186.0 lb

## 2014-07-24 DIAGNOSIS — I712 Thoracic aortic aneurysm, without rupture, unspecified: Secondary | ICD-10-CM

## 2014-07-24 DIAGNOSIS — M1711 Unilateral primary osteoarthritis, right knee: Secondary | ICD-10-CM

## 2014-07-24 DIAGNOSIS — M1712 Unilateral primary osteoarthritis, left knee: Secondary | ICD-10-CM

## 2014-07-24 DIAGNOSIS — E785 Hyperlipidemia, unspecified: Secondary | ICD-10-CM

## 2014-07-24 DIAGNOSIS — I10 Essential (primary) hypertension: Secondary | ICD-10-CM

## 2014-07-24 DIAGNOSIS — R5382 Chronic fatigue, unspecified: Secondary | ICD-10-CM

## 2014-07-24 DIAGNOSIS — Z23 Encounter for immunization: Secondary | ICD-10-CM

## 2014-07-24 MED ORDER — AMLODIPINE BESYLATE 2.5 MG PO TABS: 2.5000 mg | ORAL_TABLET | Freq: Every day | ORAL | Status: DC

## 2014-07-24 MED ORDER — AMBULATORY NON FORMULARY MEDICATION: Status: DC

## 2014-07-24 NOTE — Assessment & Plan Note (Signed)
Due to repeat lipid and liver enzymes. Continue current regimen. Can adjust dose if needed based on lab results. Starring well without any side effects or problems.

## 2014-07-24 NOTE — Progress Notes (Signed)
Subjective:    Patient ID: Todd Parsons, male    DOB: 11/26/1945, 68 y.o.   MRN: [id number]  HPI Hypertension- Pt denies chest pain, SOB, dizziness, or heart palpitations.  Taking meds as directed w/o problems.  Denies medication side effects.  Currently on Norvasc 5 mg, lisinopril 40 mg, propranolol 10 mg, and terazosin 2 milligrams.  He has gained 15 lbs since last here.    Hyperlipidemia-currently on fenofibrate and simvastatin for cholesterol control.tolerating medications well without any side effects or problems.  Usually takes his tramadol at night and says when he takes it he will wake up when it wears off.  He uses it PRN.  Not sure when to use the meloxicam vs tramadol his legs ache   Fatigue - feels like he has lost his stamina over teh last several months to year.  No hx of anemia, etc.  Former smoker.  No worsening or alleviating factors    Review of Systems  BP 98/55 mmHg  Pulse 69  Temp(Src) 98.4 F (36.9 C)  Ht 5\' 7"  (1.702 m)  Wt 186 lb (84.369 kg)  BMI 29.12 kg/m2  SpO2 95%    No Known Allergies  Past Medical History  Diagnosis Date  . Hypertension   . Hyperlipidemia   . Echocardiogram with ECG monitoring 01/12/2010    60-65% EF    Past Surgical History  Procedure Laterality Date  . Left knee arthoscopy    . Apnea device implant      then removed.    . Tonsilectomy, adenoidectomy, bilateral myringotomy and tubes      History   Social History  . Marital Status: Married    Spouse Name: N/A    Number of Children: N/A  . Years of Education: N/A   Occupational History  . [employer]    Social History Main Topics  . Smoking status: Former Smoker    Quit date: 04/24/1978  . Smokeless tobacco: Not on file  . Alcohol Use: 1.0 oz/week    2 drink(s) per week  . Drug Use: No  . Sexual Activity: Not on file   Other Topics Concern  . Not on file   Social History Narrative   Some regular exercise.     Family History  Problem Relation Age of  Onset  . Heart disease Father 84    bypass surgery  . Stroke Mother 45  . Cancer Mother     upper palate    Outpatient Encounter Prescriptions as of 07/24/2014  Medication Sig  . AMBULATORY NON FORMULARY MEDICATION Medication Name:   Shingles vaccine IM  X 1  . amLODipine (NORVASC) 2.5 MG tablet Take 1 tablet (2.5 mg total) by mouth daily.  . fenofibrate 160 MG tablet Take 1 tablet (160 mg total) by mouth daily.  . fluticasone (FLONASE) 50 MCG/ACT nasal spray Place 2 sprays into both nostrils daily.  Marland Kitchen lisinopril (PRINIVIL,ZESTRIL) 40 MG tablet Take 1 tablet (40 mg total) by mouth daily.  . meloxicam (MOBIC) 15 MG tablet Take 1 tablet (15 mg total) by mouth daily.  . propranolol (INDERAL) 10 MG tablet Take 1-2 tablets (10-20 mg total) by mouth daily as needed.  . simvastatin (ZOCOR) 40 MG tablet Take 1 tablet (40 mg total) by mouth at bedtime.  Marland Kitchen terazosin (HYTRIN) 2 MG capsule Take 1 capsule (2 mg total) by mouth at bedtime.  . traMADol (ULTRAM) 50 MG tablet TAKE 1 TABLET EVERY 8 HOURS AS NEEDED  FOR  PAIN  . [  DISCONTINUED] amLODipine (NORVASC) 5 MG tablet Take 1 tablet (5 mg total) by mouth daily.          Objective:   Physical Exam  Constitutional: He is oriented to person, place, and time. He appears well-developed and well-nourished.  HENT:  Head: Normocephalic and atraumatic.  Cardiovascular: Normal rate and regular rhythm.   2/6 high pitched heart murmur  Pulmonary/Chest: Effort normal. He has wheezes.  Slight expiratory wheeze bilaterally   Neurological: He is alert and oriented to person, place, and time.  Skin: Skin is warm and dry.  Psychiatric: He has a normal mood and affect. His behavior is normal.          Assessment & Plan:  OA- discussed how and when to use NSAIDs vs the tramaold. We can switch teh tramadol if causing insomnia. He says doesn't use ofte so will continue with it.    Fatigue- Recommend spirometry esp with prior smoking hx and abnormal  lung exam today. He will schedule soon.

## 2014-07-24 NOTE — Assessment & Plan Note (Addendum)
Well-controlled. In fact BP is quite low today.  Will cut amlodipine in half.Follow up in 6 months. Due for CMP and fasting lipid panel.

## 2014-07-24 NOTE — Assessment & Plan Note (Signed)
Has f/u schedule with Dr. Beverlyn Roux in next month for repeat imaging.  Make sure taking cholesterol med and controlling BP.

## 2014-07-26 LAB — COMPLETE METABOLIC PANEL WITH GFR
ALBUMIN: 4.2 g/dL (ref 3.5–5.2)
ALK PHOS: 46 U/L (ref 39–117)
ALT: 14 U/L (ref 0–53)
AST: 20 U/L (ref 0–37)
BILIRUBIN TOTAL: 0.4 mg/dL (ref 0.2–1.2)
BUN: 21 mg/dL (ref 6–23)
CO2: 29 mEq/L (ref 19–32)
Calcium: 9.1 mg/dL (ref 8.4–10.5)
Chloride: 104 mEq/L (ref 96–112)
Creat: 1.17 mg/dL (ref 0.50–1.35)
GFR, Est African American: 73 mL/min
GFR, Est Non African American: 63 mL/min
Glucose, Bld: 100 mg/dL — ABNORMAL HIGH (ref 70–99)
Potassium: 4.5 mEq/L (ref 3.5–5.3)
SODIUM: 139 meq/L (ref 135–145)
TOTAL PROTEIN: 6.6 g/dL (ref 6.0–8.3)

## 2014-07-26 LAB — LIPID PANEL
CHOL/HDL RATIO: 2.8 ratio
Cholesterol: 105 mg/dL (ref 0–200)
HDL: 38 mg/dL — AB (ref >39)
LDL CALC: 53 mg/dL (ref 0–99)
Triglycerides: 68 mg/dL (ref <150)
VLDL: 14 mg/dL (ref 0–40)

## 2014-08-08 ENCOUNTER — Other Ambulatory Visit: Payer: Self-pay | Admitting: *Deleted

## 2014-08-08 DIAGNOSIS — S29012A Strain of muscle and tendon of back wall of thorax, initial encounter: Secondary | ICD-10-CM

## 2014-08-08 DIAGNOSIS — F429 Obsessive-compulsive disorder, unspecified: Secondary | ICD-10-CM

## 2014-08-08 MED ORDER — MELOXICAM 15 MG PO TABS: 15.0000 mg | ORAL_TABLET | Freq: Every day | ORAL | Status: DC

## 2014-08-08 MED ORDER — FLUTICASONE PROPIONATE 50 MCG/ACT NA SUSP: 2.0000 | Freq: Every day | NASAL | Status: DC

## 2014-08-08 MED ORDER — PROPRANOLOL HCL 10 MG PO TABS: 10.0000 mg | ORAL_TABLET | Freq: Every day | ORAL | Status: DC | PRN

## 2014-08-09 ENCOUNTER — Ambulatory Visit (INDEPENDENT_AMBULATORY_CARE_PROVIDER_SITE_OTHER): Payer: Commercial Managed Care - HMO | Admitting: Family Medicine

## 2014-08-09 DIAGNOSIS — R5383 Other fatigue: Secondary | ICD-10-CM

## 2014-08-09 DIAGNOSIS — R0602 Shortness of breath: Secondary | ICD-10-CM

## 2014-08-09 DIAGNOSIS — J984 Other disorders of lung: Secondary | ICD-10-CM

## 2014-08-09 DIAGNOSIS — S29012A Strain of muscle and tendon of back wall of thorax, initial encounter: Secondary | ICD-10-CM

## 2014-08-09 DIAGNOSIS — Z87891 Personal history of nicotine dependence: Secondary | ICD-10-CM

## 2014-08-09 MED ORDER — AMLODIPINE BESYLATE 2.5 MG PO TABS: 2.5000 mg | ORAL_TABLET | Freq: Every day | ORAL | Status: DC

## 2014-08-09 MED ORDER — MELOXICAM 15 MG PO TABS
15.0000 mg | ORAL_TABLET | Freq: Every day | ORAL | Status: DC | PRN
Start: 2014-08-09 — End: 2015-01-30

## 2014-08-09 NOTE — Addendum Note (Signed)
Addended by: Beatrice Lecher D on: 08/09/2014 11:16 AM   Modules accepted: Level of Service

## 2014-08-09 NOTE — Progress Notes (Signed)
   Subjective:    Patient ID: Todd Parsons, male    DOB: [birth year], 68 y.o.   MRN: [id number]  HPI Here for spirometry today. He was a former smoker for 17 years. He did complain of some significant fatigue when he was last here.  He was in [place].  No known exposure to asbestosis. Occ get SOB walking up 2 flights of stair.  He exercises regularly.  No cough or sputum production. Hx of seasonal alergies. No hx of asthma.  Born in [place].   Review of Systems     Objective:   Physical Exam  Constitutional: He is oriented to person, place, and time. He appears well-developed and well-nourished.  Neurological: He is alert and oriented to person, place, and time.  Skin: Skin is warm and dry.  Psychiatric: He has a normal mood and affect. His behavior is normal.          Assessment & Plan:  Spirometry shows FVC of 70%, FEV1 of 79%, ratio of 89%. Most consistent with restrictive disease. No significant improvement after albuterol. If it and review the results with him. He has minimal symptoms. I would like to go ahead refer him to pulmonology for further evaluation. He will likely need full pulmonary function testing. It was felt that he gave a very good effort on today's test.  He does need a refill sent to his mail order for his amlodipine and his meloxicam. This worsened today.

## 2014-08-21 ENCOUNTER — Encounter: Payer: Self-pay | Admitting: Family Medicine

## 2014-09-02 ENCOUNTER — Other Ambulatory Visit: Payer: Self-pay

## 2014-09-02 DIAGNOSIS — F429 Obsessive-compulsive disorder, unspecified: Secondary | ICD-10-CM

## 2014-09-02 MED ORDER — FLUTICASONE PROPIONATE 50 MCG/ACT NA SUSP: 2.0000 | Freq: Every day | NASAL | Status: DC

## 2014-09-02 MED ORDER — PROPRANOLOL HCL 10 MG PO TABS: 10.0000 mg | ORAL_TABLET | Freq: Every day | ORAL | Status: DC | PRN

## 2014-10-03 ENCOUNTER — Ambulatory Visit (HOSPITAL_BASED_OUTPATIENT_CLINIC_OR_DEPARTMENT_OTHER)
Admission: RE | Admit: 2014-10-03 | Discharge: 2014-10-03 | Disposition: A | Discharge status: Home / Self Care | Payer: Commercial Managed Care - HMO | Source: Ambulatory Visit | Attending: Pulmonary Disease | Admitting: Pulmonary Disease

## 2014-10-03 ENCOUNTER — Ambulatory Visit (INDEPENDENT_AMBULATORY_CARE_PROVIDER_SITE_OTHER): Payer: Commercial Managed Care - HMO | Admitting: Pulmonary Disease

## 2014-10-03 ENCOUNTER — Encounter: Payer: Self-pay | Admitting: Pulmonary Disease

## 2014-10-03 VITALS — BP 120/80 | HR 73 | Ht 67.0 in | Wt 192.0 lb

## 2014-10-03 DIAGNOSIS — Z8709 Personal history of other diseases of the respiratory system: Secondary | ICD-10-CM | POA: Diagnosis not present

## 2014-10-03 DIAGNOSIS — J984 Other disorders of lung: Secondary | ICD-10-CM

## 2014-10-03 NOTE — Patient Instructions (Signed)
Breathing test shows mild restriction CXR today

## 2014-10-03 NOTE — Assessment & Plan Note (Signed)
He does not appear to be symptomatic, for the degree of restriction. There is no evidence of neuromuscular disease her chest wall deformity. He may have early interstitial lung disease on chest x-ray. We will proceed with a high-resolution CT scan of the chest to clarify. Has something that is mild dyspnea is more related to moderate aortic regurgitation. He does have followup with cardiology for this

## 2014-10-03 NOTE — Progress Notes (Signed)
Subjective:    Patient ID: Todd Parsons, male    DOB: Nov 13, 1945, 69 y.o.   MRN: [id number]  HPI PCP - [place]  69 year old remote smoker, referred for evaluation of abnormal spirometry. He is a retired Advertising copywriter for [employer] and lives in [place]. He reports dyspnea on exertion for about a year, about 2 flights of stairs. He is able to walk on a treadmill at 3 miles per hour for 40 minutes, he is able to go through his zumba class and keep up with younger women. He has a bicuspid aortic valve that is being monitored by yearly echo, last 05/2014 shows moderate aortic regurgitation. 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  He smoked as a teenager-about 70 pack years before he quit at age 89. He denies wheezing, coughing or frequent chest colds. Blood pressures were controlled on lisinopril and propranolol. Spirometry showed no evidence of airway obstruction, FEV1 was 79%, FVC was 70%. His oxygen saturation was 99%, and he does not desaturate on walking.  Chest x-ray-shows, mild interstitial scarring especially at the bases  Past Medical History  Diagnosis Date  . Hypertension   . Hyperlipidemia   . Echocardiogram with ECG monitoring 01/12/2010    60-65% EF    Past Surgical History  Procedure Laterality Date  . Left knee arthoscopy    . Apnea device implant      then removed.    . Tonsilectomy, adenoidectomy, bilateral myringotomy and tubes      No Known Allergies  History   Social History  . Marital Status: Married    Spouse Name: N/A    Number of Children: N/A  . Years of Education: N/A   Occupational History  . [employer]    Social History Main Topics  . Smoking status: Former Smoker    Quit date: 04/24/1978  . Smokeless tobacco: Not on file  . Alcohol Use: 1.2 oz/week    2 Not specified per week  . Drug Use: No  . Sexual Activity: Not on file   Other Topics Concern  . Not on file   Social  History Narrative   Some regular exercise.     Family History  Problem Relation Age of Onset  . Heart disease Father 80    bypass surgery  . Stroke Mother 69  . Cancer Mother     upper palate      Review of Systems  Constitutional: Negative for fever and unexpected weight change.  HENT: Negative for congestion, dental problem, ear pain, nosebleeds, postnasal drip, rhinorrhea, sinus pressure, sneezing, sore throat and trouble swallowing.   Eyes: Negative for redness and itching.  Respiratory: Negative for cough, chest tightness, shortness of breath and wheezing.   Cardiovascular: Negative for palpitations and leg swelling.  Gastrointestinal: Negative for nausea and vomiting.  Genitourinary: Negative for dysuria.  Musculoskeletal: Negative for joint swelling.  Skin: Negative for rash.  Neurological: Negative for headaches.  Hematological: Does not bruise/bleed easily.  Psychiatric/Behavioral: Negative for dysphoric mood. The patient is not nervous/anxious.        Objective:   Physical Exam  Gen. Pleasant, well-nourished, in no distress, normal affect ENT - no lesions, no post nasal drip Neck: No JVD, no thyromegaly, no carotid bruits Lungs: no use of accessory muscles, no dullness to percussion, clear without rales or rhonchi  Cardiovascular: Rhythm regular, heart sounds  normal, no murmurs or gallops, no peripheral edema Abdomen: soft and non-tender, no hepatosplenomegaly,  BS normal. Musculoskeletal: No deformities, no cyanosis or clubbing Neuro:  alert, non focal       Assessment & Plan:

## 2014-10-04 ENCOUNTER — Telehealth: Payer: Self-pay | Admitting: Pulmonary Disease

## 2014-10-04 NOTE — Telephone Encounter (Signed)
Patient wants to know if he can wait until September 2016 and have the CT done then.  He gets CT every year and he is still paying for the CT he had done last year.  He says he would have the CT done if you feel it would find some thing that is treatable.  He doesn't want to "just know about something and it not be anything that can be treated".

## 2014-10-07 NOTE — Telephone Encounter (Signed)
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

## 2014-10-08 ENCOUNTER — Encounter: Payer: Self-pay | Admitting: *Deleted

## 2014-10-08 NOTE — Telephone Encounter (Signed)
Patient notified.  Letter sent to [facility] regarding order for High Resolution CT.  Patient will call to schedule appointment in September after CT has been done.  Nothing further needed at this time.

## 2014-11-04 ENCOUNTER — Ambulatory Visit (INDEPENDENT_AMBULATORY_CARE_PROVIDER_SITE_OTHER): Payer: Commercial Managed Care - HMO | Admitting: Family Medicine

## 2014-11-04 ENCOUNTER — Ambulatory Visit: Payer: Self-pay | Admitting: Family Medicine

## 2014-11-04 ENCOUNTER — Encounter: Payer: Self-pay | Admitting: Family Medicine

## 2014-11-04 VITALS — BP 122/71 | HR 79 | Ht 67.0 in | Wt 194.0 lb

## 2014-11-04 DIAGNOSIS — J329 Chronic sinusitis, unspecified: Secondary | ICD-10-CM

## 2014-11-04 DIAGNOSIS — R05 Cough: Secondary | ICD-10-CM

## 2014-11-04 DIAGNOSIS — R059 Cough, unspecified: Secondary | ICD-10-CM

## 2014-11-04 DIAGNOSIS — R0982 Postnasal drip: Secondary | ICD-10-CM

## 2014-11-04 MED ORDER — AZITHROMYCIN 250 MG PO TABS: ORAL_TABLET | ORAL | Status: DC

## 2014-11-04 NOTE — Progress Notes (Signed)
   Subjective:    Patient ID: Todd Parsons, male    DOB: [birth year], 69 y.o.   MRN: [id number]  HPI Cough x 2-3 weeks, semi-productuve.  No fever, chills.  + post nasal drip.  Using mucinex. Using OTC alkeserltzer for a decongesttant. No facial pain or pressure. occ sneezing. No heartburn or reflux.  No ear pain or ST. he was recently seen by the pulmonologist for mild restrictive lung disease-he was noted to have some possible interstitial lung disease noted on his chest x-ray and they had recommended high resolution CT. He normally gets the CT at this chest to evaluate his heart in the fall and wonders if it would be okay to wait 6 months to have it done and have it aligned with the imaging for his heart.  Enrolled in a study out of [facility] regarding co-co extract and vitamins for heart disease.  Will be in the study x 4 weeks.    Review of Systems     Objective:   Physical Exam  Constitutional: He is oriented to person, place, and time. He appears well-developed and well-nourished.  HENT:  Head: Normocephalic and atraumatic.  Right Ear: External ear normal.  Left Ear: External ear normal.  Nose: Nose normal.  Mouth/Throat: Oropharynx is clear and moist.  TMs and canals are clear.   Eyes: Conjunctivae and EOM are normal. Pupils are equal, round, and reactive to light.  Neck: Neck supple. No thyromegaly present.  Cardiovascular: Normal rate and normal heart sounds.   Pulmonary/Chest: Effort normal and breath sounds normal.  Lymphadenopathy:    He has no cervical adenopathy.  Neurological: He is alert and oriented to person, place, and time.  Skin: Skin is warm and dry.  Psychiatric: He has a normal mood and affect.          Assessment & Plan:  Cough- unclear etiology at this point. Could be infectious versus post viral versus GERD versus sinusitis. Since he has had the cough for almost 3 weeks with persistent postnasal drip going to go ahead and treat him for acute  sinusitis with azithromycin. The cough is not improving over the next 2 weeks and please let me know. At that point we will consider chest x-ray as he will have had the cough for over a month at that point in time. He can call and let me know. Especially with his history over strict of lung disease.

## 2015-01-01 ENCOUNTER — Ambulatory Visit (INDEPENDENT_AMBULATORY_CARE_PROVIDER_SITE_OTHER): Payer: Commercial Managed Care - HMO | Admitting: Family Medicine

## 2015-01-01 ENCOUNTER — Encounter: Payer: Self-pay | Admitting: Family Medicine

## 2015-01-01 VITALS — BP 107/75 | HR 85 | Wt 186.0 lb

## 2015-01-01 DIAGNOSIS — R3915 Urgency of urination: Secondary | ICD-10-CM | POA: Diagnosis not present

## 2015-01-01 DIAGNOSIS — A084 Viral intestinal infection, unspecified: Secondary | ICD-10-CM | POA: Diagnosis not present

## 2015-01-01 LAB — POCT URINALYSIS DIPSTICK
Bilirubin, UA: NEGATIVE
Blood, UA: NEGATIVE
Glucose, UA: NEGATIVE
KETONES UA: NEGATIVE
LEUKOCYTES UA: NEGATIVE
NITRITE UA: NEGATIVE
PH UA: 6
PROTEIN UA: NEGATIVE
Spec Grav, UA: >=1.03
Urobilinogen, UA: 0.2

## 2015-01-01 MED ORDER — TOLTERODINE TARTRATE ER 2 MG PO CP24: 2.0000 mg | ORAL_CAPSULE | Freq: Every day | ORAL | Status: DC

## 2015-01-01 NOTE — Progress Notes (Signed)
   Subjective:    Patient ID: Todd Parsons, male    DOB: 10/18/1945, 69 y.o.   MRN: [id number]  HPI Patient complete the diarrhea and vomiting  4 days. The initial day he had some stomach discomfort and pain. He is feeling some better today.  + sick contract.  No fever, chills or sweat. No blood in the stool.   Urinary urgency - Has been going over for over a year.  Says cut back on the terazosin and that helped. No burning or dysuria. It does take him quite a while to complete urination. He does feel like he's attempting his bladder though. He has known BPH and is Re: On terazosin.   Review of Systems     Objective:   Physical Exam  Constitutional: He is oriented to person, place, and time. He appears well-developed and well-nourished.  HENT:  Head: Normocephalic and atraumatic.  Cardiovascular: Normal rate, regular rhythm and normal heart sounds.   Pulmonary/Chest: Effort normal and breath sounds normal.  Abdominal: Soft. Bowel sounds are normal. He exhibits no distension and no mass. There is no tenderness. There is no rebound and no guarding.  Neurological: He is alert and oriented to person, place, and time.  Skin: Skin is warm and dry.  Psychiatric: He has a normal mood and affect. His behavior is normal.          Assessment & Plan:  Viral gastroenteritis - he is already on the mend and doing better. Call if any persistence of symptoms. Advance diet as tolerated.  OAB - suspect based on his description he has overactive bladder. We did discuss potential referral to urology for further evaluation. We also discussed that sometimes when the bladder doesn't empty properly it can cause urgency as well. He says he would like to do a trial of an OAB medication and see if it's helpful. If not then he will be happy to go to urology. It looks like Detrol covered better on his formulary so we will start with this.

## 2015-01-02 ENCOUNTER — Other Ambulatory Visit: Payer: Self-pay | Admitting: *Deleted

## 2015-01-02 MED ORDER — FENOFIBRATE 160 MG PO TABS: 160.0000 mg | ORAL_TABLET | Freq: Every day | ORAL | Status: DC

## 2015-01-02 MED ORDER — FLUTICASONE PROPIONATE 50 MCG/ACT NA SUSP: 2.0000 | Freq: Every day | NASAL | Status: DC

## 2015-01-02 MED ORDER — TERAZOSIN HCL 2 MG PO CAPS: 2.0000 mg | ORAL_CAPSULE | Freq: Every day | ORAL | Status: DC

## 2015-01-02 MED ORDER — SIMVASTATIN 40 MG PO TABS: 40.0000 mg | ORAL_TABLET | Freq: Every day | ORAL | Status: DC

## 2015-01-30 ENCOUNTER — Encounter: Payer: Self-pay | Admitting: Family Medicine

## 2015-01-30 ENCOUNTER — Ambulatory Visit (INDEPENDENT_AMBULATORY_CARE_PROVIDER_SITE_OTHER): Payer: Commercial Managed Care - HMO | Admitting: Family Medicine

## 2015-01-30 VITALS — BP 121/79 | HR 69 | Wt 193.0 lb

## 2015-01-30 DIAGNOSIS — I10 Essential (primary) hypertension: Secondary | ICD-10-CM

## 2015-01-30 DIAGNOSIS — Z0189 Encounter for other specified special examinations: Secondary | ICD-10-CM | POA: Diagnosis not present

## 2015-01-30 DIAGNOSIS — Z0000 Encounter for general adult medical examination without abnormal findings: Secondary | ICD-10-CM

## 2015-01-30 DIAGNOSIS — Z125 Encounter for screening for malignant neoplasm of prostate: Secondary | ICD-10-CM | POA: Diagnosis not present

## 2015-01-30 DIAGNOSIS — Z1159 Encounter for screening for other viral diseases: Secondary | ICD-10-CM

## 2015-01-30 DIAGNOSIS — E1159 Type 2 diabetes mellitus with other circulatory complications: Secondary | ICD-10-CM | POA: Insufficient documentation

## 2015-01-30 DIAGNOSIS — R7301 Impaired fasting glucose: Secondary | ICD-10-CM

## 2015-01-30 LAB — BASIC METABOLIC PANEL WITH GFR
BUN: 21 mg/dL (ref 6–23)
CALCIUM: 9.8 mg/dL (ref 8.4–10.5)
CHLORIDE: 105 meq/L (ref 96–112)
CO2: 28 mEq/L (ref 19–32)
Creat: 1.13 mg/dL (ref 0.50–1.35)
GFR, EST AFRICAN AMERICAN: 76 mL/min
GFR, EST NON AFRICAN AMERICAN: 66 mL/min
Glucose, Bld: 94 mg/dL (ref 70–99)
Potassium: 4.6 mEq/L (ref 3.5–5.3)
SODIUM: 140 meq/L (ref 135–145)

## 2015-01-30 LAB — POCT GLYCOSYLATED HEMOGLOBIN (HGB A1C): HEMOGLOBIN A1C: 6

## 2015-01-30 MED ORDER — TOLTERODINE TARTRATE ER 2 MG PO CP24: 2.0000 mg | ORAL_CAPSULE | Freq: Every day | ORAL | Status: DC

## 2015-01-30 NOTE — Progress Notes (Signed)
Subjective:    Patient ID: Todd Parsons, male    DOB: 1945-12-22, 69 y.o.   MRN: [id number]  HPI Here for CPE -   Doing well.  Some exercise. He is working in the yard. Needs Detrol sent to mail order.   Review of Systems Comprehensive ROS is negative.   BP 121/79 mmHg  Pulse 69  Wt 193 lb (87.544 kg)  SpO2 97%    No Known Allergies  Past Medical History  Diagnosis Date  . Hypertension   . Hyperlipidemia   . Echocardiogram with ECG monitoring 01/12/2010    60-65% EF    Past Surgical History  Procedure Laterality Date  . Left knee arthoscopy    . Apnea device implant      then removed.    . Tonsilectomy, adenoidectomy, bilateral myringotomy and tubes      History   Social History  . Marital Status: Married    Spouse Name: N/A  . Number of Children: N/A  . Years of Education: N/A   Occupational History  . [place] [employer]    Social History Main Topics  . Smoking status: Former Smoker    Quit date: 04/24/1978  . Smokeless tobacco: Not on file  . Alcohol Use: 1.2 oz/week    2 Standard drinks or equivalent per week  . Drug Use: No  . Sexual Activity: Not on file   Other Topics Concern  . Not on file   Social History Narrative   Some regular exercise.     Family History  Problem Relation Age of Onset  . Heart disease Father 66    bypass surgery  . Stroke Mother 79  . Cancer Mother     upper palate    Outpatient Encounter Prescriptions as of 01/30/2015  Medication Sig  . amLODipine (NORVASC) 2.5 MG tablet Take 1 tablet (2.5 mg total) by mouth daily.  Marland Kitchen aspirin 81 MG tablet Take 81 mg by mouth. Two tablets by mouth daily  . co-enzyme Q-10 30 MG capsule Take 30 mg by mouth.  . fenofibrate 160 MG tablet Take 1 tablet (160 mg total) by mouth daily.  . fluticasone (FLONASE) 50 MCG/ACT nasal spray Place 2 sprays into both nostrils daily.  Marland Kitchen lisinopril (PRINIVIL,ZESTRIL) 40 MG tablet Take 1 tablet (40 mg total) by mouth daily. (Patient taking differently:  Take 40 mg by mouth daily. 1/2 tablet daily)  . meloxicam (MOBIC) 15 MG tablet Take 1 tablet (15 mg total) by mouth daily as needed for pain.  . Omega-3 Fatty Acids (FISH OIL) 1000 MG CAPS Take by mouth.  . propranolol (INDERAL) 10 MG tablet Take 1-2 tablets (10-20 mg total) by mouth daily as needed.  . simvastatin (ZOCOR) 40 MG tablet Take 1 tablet (40 mg total) by mouth at bedtime.  Marland Kitchen terazosin (HYTRIN) 2 MG capsule Take 1 capsule (2 mg total) by mouth at bedtime.  . tolterodine (DETROL LA) 2 MG 24 hr capsule Take 1 capsule (2 mg total) by mouth daily.  . traMADol (ULTRAM) 50 MG tablet TAKE 1 TABLET EVERY 8 HOURS AS NEEDED  FOR  PAIN   No facility-administered encounter medications on file as of 01/30/2015.           Objective:   Physical Exam  Constitutional: He is oriented to person, place, and time. He appears well-developed and well-nourished.  HENT:  Head: Normocephalic and atraumatic.  Right Ear: External ear normal.  Left Ear: External ear normal.  Nose: Nose normal.  Mouth/Throat: Oropharynx is clear and moist.  Eyes: Conjunctivae and EOM are normal. Pupils are equal, round, and reactive to light.  Neck: Normal range of motion. Neck supple. No thyromegaly present.  Cardiovascular: Normal rate, regular rhythm, normal heart sounds and intact distal pulses.   Pulmonary/Chest: Effort normal and breath sounds normal.  Abdominal: Soft. Bowel sounds are normal. He exhibits no distension and no mass. There is no tenderness. There is no rebound and no guarding.  Musculoskeletal: Normal range of motion.  Lymphadenopathy:    He has no cervical adenopathy.  Neurological: He is alert and oriented to person, place, and time. He has normal reflexes.  Skin: Skin is warm and dry.  Psychiatric: He has a normal mood and affect. His behavior is normal. Judgment and thought content normal.          Assessment & Plan:  CPE Keep up a regular exercise program and make sure you are eating  a healthy diet Try to eat 4 servings of dairy a day, or if you are lactose intolerant take a calcium with vitamin D daily.  Your vaccines are up to date.  Reminded ot get an eye exam.    IFG - discussed the diagnosis. He can A1c of 6.0 today. Discussed the importance of diet and exercise. Recommend avoiding concentrated sweets and watching portion sizes on carbohydrates intake. We will regroup and check this again in 6 months.

## 2015-01-31 LAB — PSA: PSA: 0.91 ng/mL (ref <=4.00)

## 2015-01-31 LAB — HEPATITIS C ANTIBODY: HCV Ab: NEGATIVE

## 2015-04-04 DIAGNOSIS — H43813 Vitreous degeneration, bilateral: Secondary | ICD-10-CM | POA: Diagnosis not present

## 2015-04-04 DIAGNOSIS — H5210 Myopia, unspecified eye: Secondary | ICD-10-CM | POA: Diagnosis not present

## 2015-04-04 DIAGNOSIS — H5203 Hypermetropia, bilateral: Secondary | ICD-10-CM | POA: Diagnosis not present

## 2015-04-24 ENCOUNTER — Other Ambulatory Visit: Payer: Self-pay | Admitting: Family Medicine

## 2015-05-02 ENCOUNTER — Telehealth: Payer: Self-pay

## 2015-05-02 NOTE — Telephone Encounter (Signed)
Medication already sent

## 2015-05-07 ENCOUNTER — Encounter: Payer: Self-pay | Admitting: Family Medicine

## 2015-06-04 DIAGNOSIS — I351 Nonrheumatic aortic (valve) insufficiency: Secondary | ICD-10-CM | POA: Diagnosis not present

## 2015-06-04 DIAGNOSIS — I10 Essential (primary) hypertension: Secondary | ICD-10-CM | POA: Diagnosis not present

## 2015-06-04 DIAGNOSIS — Q231 Congenital insufficiency of aortic valve: Secondary | ICD-10-CM | POA: Diagnosis not present

## 2015-06-04 DIAGNOSIS — I712 Thoracic aortic aneurysm, without rupture: Secondary | ICD-10-CM | POA: Diagnosis not present

## 2015-06-05 ENCOUNTER — Encounter: Payer: Self-pay | Admitting: Family Medicine

## 2015-06-05 ENCOUNTER — Ambulatory Visit (INDEPENDENT_AMBULATORY_CARE_PROVIDER_SITE_OTHER): Payer: Commercial Managed Care - HMO | Admitting: Family Medicine

## 2015-06-05 VITALS — BP 131/63 | HR 72 | Temp 99.0°F | Ht 67.0 in | Wt 192.0 lb

## 2015-06-05 DIAGNOSIS — M791 Myalgia: Secondary | ICD-10-CM

## 2015-06-05 DIAGNOSIS — IMO0001 Reserved for inherently not codable concepts without codable children: Secondary | ICD-10-CM

## 2015-06-05 DIAGNOSIS — R300 Dysuria: Secondary | ICD-10-CM | POA: Diagnosis not present

## 2015-06-05 DIAGNOSIS — N3000 Acute cystitis without hematuria: Secondary | ICD-10-CM

## 2015-06-05 DIAGNOSIS — E785 Hyperlipidemia, unspecified: Secondary | ICD-10-CM | POA: Diagnosis not present

## 2015-06-05 DIAGNOSIS — M609 Myositis, unspecified: Secondary | ICD-10-CM

## 2015-06-05 LAB — POCT URINALYSIS DIPSTICK
BILIRUBIN UA: NEGATIVE
GLUCOSE UA: NEGATIVE
Ketones, UA: NEGATIVE
NITRITE UA: NEGATIVE
Protein, UA: NEGATIVE
Spec Grav, UA: 1.02
Urobilinogen, UA: 0.2
pH, UA: 6.5

## 2015-06-05 MED ORDER — PRAVASTATIN SODIUM 20 MG PO TABS: 20.0000 mg | ORAL_TABLET | Freq: Every day | ORAL | Status: DC

## 2015-06-05 MED ORDER — SULFAMETHOXAZOLE-TRIMETHOPRIM 800-160 MG PO TABS: 1.0000 | ORAL_TABLET | Freq: Two times a day (BID) | ORAL | Status: DC

## 2015-06-05 NOTE — Addendum Note (Signed)
Addended by: Teddy Spike on: 06/05/2015 04:48 PM   Modules accepted: Orders

## 2015-06-05 NOTE — Progress Notes (Signed)
   Subjective:    Patient ID: Todd Parsons, male    DOB: May 05, 1946, 69 y.o.   MRN: [id number]  HPI  Burning with urination and frequency x 1 day.  Felt achey and HA.  No fever.  No low back pain. No recent antibiotics in the last 60 days. No recent catherization.    follow-up Dorthea Cove says he has decreased his simvastatin to 3 days per week, Mondays Wednesdays and Fridays and is still having a lot of aching from his elbows up to his shoulders especially after exercise. He does feel like it's been better on the decreased dosing regimen but still is fair.  Review of Systems     Objective:   Physical Exam  Constitutional: He is oriented to person, place, and time. He appears well-developed and well-nourished.  Musculoskeletal:  No CVA tenderness   Neurological: He is alert and oriented to person, place, and time.  Skin: Skin is warm and dry.  Psychiatric: He has a normal mood and affect. His behavior is normal.          Assessment & Plan:   UTI-we'll treat with Bactrim. Call if not better or suddenly gets worse. Will send culture for confirmation. He is low risk overall with no recent antibiotic use or recent catheterization. Next   Hyperlipidemia-we'll try a different statin.

## 2015-06-05 NOTE — Patient Instructions (Signed)

## 2015-06-07 LAB — URINE CULTURE

## 2015-06-24 ENCOUNTER — Encounter: Payer: Self-pay | Admitting: Family Medicine

## 2015-07-08 HISTORY — PX: CARDIAC CATHETERIZATION: SHX172

## 2015-07-10 DIAGNOSIS — I351 Nonrheumatic aortic (valve) insufficiency: Secondary | ICD-10-CM | POA: Diagnosis not present

## 2015-07-18 ENCOUNTER — Telehealth: Payer: Self-pay | Admitting: Family Medicine

## 2015-07-18 DIAGNOSIS — I711 Thoracic aortic aneurysm, ruptured, unspecified: Secondary | ICD-10-CM

## 2015-07-18 DIAGNOSIS — I350 Nonrheumatic aortic (valve) stenosis: Secondary | ICD-10-CM

## 2015-07-18 NOTE — Telephone Encounter (Signed)
Ok to place referral.

## 2015-07-18 NOTE — Telephone Encounter (Signed)
Dr. Ethel Rana from [facility] called and stated that Mr. Wolfgramm was scheduled Tuesday with Dr. Roselyn Bering and needed a silverback authorization due to [facility] insurance. Can you please place the referral the diagnosis codes are I35.0 and I71.9. I will submit through silverback.   Thank you Jenny Reichmann

## 2015-07-22 DIAGNOSIS — G4733 Obstructive sleep apnea (adult) (pediatric): Secondary | ICD-10-CM | POA: Diagnosis not present

## 2015-07-22 DIAGNOSIS — I2582 Chronic total occlusion of coronary artery: Secondary | ICD-10-CM | POA: Diagnosis not present

## 2015-07-22 DIAGNOSIS — I719 Aortic aneurysm of unspecified site, without rupture: Secondary | ICD-10-CM | POA: Diagnosis not present

## 2015-07-22 DIAGNOSIS — Z79899 Other long term (current) drug therapy: Secondary | ICD-10-CM | POA: Diagnosis not present

## 2015-07-22 DIAGNOSIS — I351 Nonrheumatic aortic (valve) insufficiency: Secondary | ICD-10-CM | POA: Diagnosis not present

## 2015-07-22 DIAGNOSIS — Z87891 Personal history of nicotine dependence: Secondary | ICD-10-CM | POA: Diagnosis not present

## 2015-07-22 DIAGNOSIS — I2510 Atherosclerotic heart disease of native coronary artery without angina pectoris: Secondary | ICD-10-CM | POA: Diagnosis not present

## 2015-07-22 DIAGNOSIS — Z86010 Personal history of colonic polyps: Secondary | ICD-10-CM | POA: Diagnosis not present

## 2015-07-22 DIAGNOSIS — Z01818 Encounter for other preprocedural examination: Secondary | ICD-10-CM | POA: Diagnosis not present

## 2015-07-22 NOTE — Telephone Encounter (Signed)
Referral placed.

## 2015-07-25 ENCOUNTER — Ambulatory Visit: Payer: Commercial Managed Care - HMO | Admitting: Family Medicine

## 2015-07-25 ENCOUNTER — Ambulatory Visit (INDEPENDENT_AMBULATORY_CARE_PROVIDER_SITE_OTHER): Payer: Commercial Managed Care - HMO | Admitting: Family Medicine

## 2015-07-25 ENCOUNTER — Encounter: Payer: Self-pay | Admitting: Family Medicine

## 2015-07-25 VITALS — BP 123/74 | HR 77 | Temp 98.0°F | Resp 18 | Wt 197.0 lb

## 2015-07-25 DIAGNOSIS — C44310 Basal cell carcinoma of skin of unspecified parts of face: Secondary | ICD-10-CM

## 2015-07-25 DIAGNOSIS — R05 Cough: Secondary | ICD-10-CM

## 2015-07-25 DIAGNOSIS — R053 Chronic cough: Secondary | ICD-10-CM

## 2015-07-25 MED ORDER — IMIQUIMOD 5 % EX CREA: TOPICAL_CREAM | CUTANEOUS | Status: DC

## 2015-07-25 MED ORDER — LOSARTAN POTASSIUM 50 MG PO TABS: 50.0000 mg | ORAL_TABLET | Freq: Every day | ORAL | Status: DC

## 2015-07-25 NOTE — Progress Notes (Signed)
   Subjective:    Patient ID: Todd Parsons, male    DOB: [birth year], 69 y.o.   MRN: [id number]  HPI Lesion near left side burn x 1 months. He says the lesion itself is not itchy or bothersome. They does raise. He says he doesn't think it's gotten larger or smaller or changed color. I wanted to come in and get it checked out.  He also wanted to update me today. He is actually scheduled for cardiac valve replacement and CABG 2 vessels on November 29 at [facility].  Patient also notes an intermittent dry tickling cough in his throat. He says it does seem to come and go. Does get a little worse in the spring and the fall and sometimes he uses Sudafed PE which does seem to help. He says the cough is never productive no shortness of breath.  Review of Systems     Objective:   Physical Exam  Constitutional: He is oriented to person, place, and time. He appears well-developed and well-nourished.  HENT:  Head: Normocephalic and atraumatic.  He has an approximately 68mm round skin colored lesion near the left side burn in front of the ear.nodry scale. Under the light it has a more shiney appearance.   Eyes: Conjunctivae and EOM are normal.  Cardiovascular: Normal rate.   Pulmonary/Chest: Effort normal.  Neurological: He is alert and oriented to person, place, and time.  Skin: Skin is dry. No pallor.  Psychiatric: He has a normal mood and affect. His behavior is normal.  Vitals reviewed.         Assessment & Plan:  Possible Basal cell skin cancer - discussed options. Consider shave biopsy versus Aldara cream for 6 weeks. Patient opted to try the cream first. If it's not improving resolving then recommend come in for biopsy. I did warn that we will turn this can read and cause irritation and to just use a small amount specifically on the lesion.  Chronic cough-ACE induced cough - recommend discontinue lisinopril and switch to losartan. New perception sent to the mail order  pharmacy.  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

## 2015-07-28 ENCOUNTER — Telehealth: Payer: Self-pay | Admitting: Family Medicine

## 2015-07-28 DIAGNOSIS — I351 Nonrheumatic aortic (valve) insufficiency: Secondary | ICD-10-CM

## 2015-07-28 NOTE — Telephone Encounter (Signed)
Dr. Eveline Keto from [facility] and stated that Mr. Shia has an appointment with Dr. Lunette Stands on 11/29. He has [employer] and has to have a silverback authorization for Dx: I35.1. Is it ok to place this referral.   Jenny Reichmann

## 2015-07-28 NOTE — Telephone Encounter (Signed)
Referral ordered

## 2015-07-29 ENCOUNTER — Other Ambulatory Visit: Payer: Self-pay

## 2015-07-29 MED ORDER — IMIQUIMOD 5 % EX CREA: TOPICAL_CREAM | CUTANEOUS | Status: DC

## 2015-08-05 ENCOUNTER — Ambulatory Visit: Payer: Commercial Managed Care - HMO | Admitting: Family Medicine

## 2015-08-05 DIAGNOSIS — R739 Hyperglycemia, unspecified: Secondary | ICD-10-CM | POA: Diagnosis not present

## 2015-08-05 DIAGNOSIS — I351 Nonrheumatic aortic (valve) insufficiency: Secondary | ICD-10-CM | POA: Diagnosis not present

## 2015-08-05 DIAGNOSIS — D688 Other specified coagulation defects: Secondary | ICD-10-CM | POA: Diagnosis not present

## 2015-08-05 DIAGNOSIS — I712 Thoracic aortic aneurysm, without rupture: Secondary | ICD-10-CM | POA: Diagnosis not present

## 2015-08-05 DIAGNOSIS — I97190 Other postprocedural cardiac functional disturbances following cardiac surgery: Secondary | ICD-10-CM | POA: Diagnosis not present

## 2015-08-05 DIAGNOSIS — G8918 Other acute postprocedural pain: Secondary | ICD-10-CM | POA: Diagnosis not present

## 2015-08-05 DIAGNOSIS — D696 Thrombocytopenia, unspecified: Secondary | ICD-10-CM | POA: Diagnosis not present

## 2015-08-05 DIAGNOSIS — E441 Mild protein-calorie malnutrition: Secondary | ICD-10-CM | POA: Diagnosis not present

## 2015-08-05 DIAGNOSIS — R0689 Other abnormalities of breathing: Secondary | ICD-10-CM | POA: Diagnosis not present

## 2015-08-05 DIAGNOSIS — I2510 Atherosclerotic heart disease of native coronary artery without angina pectoris: Secondary | ICD-10-CM | POA: Diagnosis not present

## 2015-08-05 DIAGNOSIS — J9811 Atelectasis: Secondary | ICD-10-CM | POA: Diagnosis not present

## 2015-08-05 DIAGNOSIS — R0989 Other specified symptoms and signs involving the circulatory and respiratory systems: Secondary | ICD-10-CM | POA: Diagnosis not present

## 2015-08-05 DIAGNOSIS — I959 Hypotension, unspecified: Secondary | ICD-10-CM | POA: Diagnosis not present

## 2015-08-05 DIAGNOSIS — I4891 Unspecified atrial fibrillation: Secondary | ICD-10-CM | POA: Diagnosis not present

## 2015-08-05 DIAGNOSIS — I499 Cardiac arrhythmia, unspecified: Secondary | ICD-10-CM | POA: Diagnosis not present

## 2015-08-05 DIAGNOSIS — J90 Pleural effusion, not elsewhere classified: Secondary | ICD-10-CM | POA: Diagnosis not present

## 2015-08-05 DIAGNOSIS — Z952 Presence of prosthetic heart valve: Secondary | ICD-10-CM | POA: Diagnosis not present

## 2015-08-05 DIAGNOSIS — R570 Cardiogenic shock: Secondary | ICD-10-CM | POA: Diagnosis not present

## 2015-08-05 DIAGNOSIS — I97611 Postprocedural hemorrhage and hematoma of a circulatory system organ or structure following cardiac bypass: Secondary | ICD-10-CM | POA: Diagnosis not present

## 2015-08-05 DIAGNOSIS — J95821 Acute postprocedural respiratory failure: Secondary | ICD-10-CM | POA: Diagnosis not present

## 2015-08-05 DIAGNOSIS — I9789 Other postprocedural complications and disorders of the circulatory system, not elsewhere classified: Secondary | ICD-10-CM | POA: Diagnosis not present

## 2015-08-05 DIAGNOSIS — Z951 Presence of aortocoronary bypass graft: Secondary | ICD-10-CM | POA: Diagnosis not present

## 2015-08-05 DIAGNOSIS — D62 Acute posthemorrhagic anemia: Secondary | ICD-10-CM | POA: Diagnosis not present

## 2015-08-05 HISTORY — PX: CARDIAC VALVE REPLACEMENT: SHX585

## 2015-08-05 HISTORY — PX: CORONARY ARTERY BYPASS GRAFT: SHX141

## 2015-08-14 ENCOUNTER — Ambulatory Visit (INDEPENDENT_AMBULATORY_CARE_PROVIDER_SITE_OTHER): Payer: Commercial Managed Care - HMO | Admitting: Family Medicine

## 2015-08-14 VITALS — BP 106/71 | HR 78 | Ht 67.0 in | Wt 187.0 lb

## 2015-08-14 DIAGNOSIS — G4700 Insomnia, unspecified: Secondary | ICD-10-CM

## 2015-08-14 DIAGNOSIS — I2510 Atherosclerotic heart disease of native coronary artery without angina pectoris: Secondary | ICD-10-CM

## 2015-08-14 DIAGNOSIS — D2330 Other benign neoplasm of skin of unspecified part of face: Secondary | ICD-10-CM

## 2015-08-14 DIAGNOSIS — Z9889 Other specified postprocedural states: Secondary | ICD-10-CM

## 2015-08-14 DIAGNOSIS — Z951 Presence of aortocoronary bypass graft: Secondary | ICD-10-CM | POA: Diagnosis not present

## 2015-08-14 DIAGNOSIS — Z79899 Other long term (current) drug therapy: Secondary | ICD-10-CM

## 2015-08-14 DIAGNOSIS — R630 Anorexia: Secondary | ICD-10-CM

## 2015-08-14 DIAGNOSIS — D2230 Melanocytic nevi of unspecified part of face: Secondary | ICD-10-CM

## 2015-08-14 MED ORDER — ATORVASTATIN CALCIUM 40 MG PO TABS
40.0000 mg | ORAL_TABLET | Freq: Every day | ORAL | Status: DC
Start: 2015-08-14 — End: 2016-05-20

## 2015-08-14 NOTE — Progress Notes (Signed)
Subjective:    Patient ID: Todd Parsons, male    DOB: [birth year], 69 y.o.   MRN: [id number]  HPI 69 year old male comes in today post operatively to follow-up. On November 3 he had surgery for AORTIC ROOT REPLACEMENT, ANEURYSM REPAIR ASCENDING AORTA, CABG for severe aortic insufficiency. He did very well overall postoperatively. He is doing okay. He has lost a fair amount of weight. By our scale about 10 pounds. He said food just doesn't taste good to him right now. That he is eating more than he was when he first came home from the hospital. He is also not sleeping well. He says is just extremely difficult to get comfortable. He mostly has to lie on his back to fall asleep and that is not his normal sleep position. He does have a follow-up in January with the cardiothoracic surgeon. They did end up doing a bypass on 3 vessels instead of 2 vessels around the heart. He also had some questions about his medications postoperatively. They discontinued his meloxicam and amlodipine. They also put him on a taper for amiodarone over the next several weeks. In addition they switched his propranolol to metoprolol. He also has 2 statins at home. He initially had stopped the simvastatin because he thought it was causing some arm pain. But per the the surgeon and his arm pain was most likely actually coming from the blockages around his heart. Says he's not sure he really has an intolerance to simvastatin. He wants to know what would be best to take potassium he is taking 2 baby aspirin's right now as well.  He does have the lesion on the left side of the temple near his ear. He was unable to afford the Aldara cream.   Review of Systems  BP 106/71 mmHg  Pulse 78  Ht 5\' 7"  (1.702 m)  Wt 187 lb (84.823 kg)  BMI 29.28 kg/m2    Allergies  Allergen Reactions  . Simvastatin Other (See Comments)    Myalgias    Past Medical History  Diagnosis Date  . Hypertension   . Hyperlipidemia   . Echocardiogram with  ECG monitoring 01/12/2010    60-65% EF  . Bicuspid aortic valve     Past Surgical History  Procedure Laterality Date  . Left knee arthoscopy    . Apnea device implant      then removed.    . Tonsilectomy, adenoidectomy, bilateral myringotomy and tubes    . Acromio-clavicular joint repair Right 1980s         Social History   Social History  . Marital Status: Married    Spouse Name: N/A  . Number of Children: N/A  . Years of Education: N/A   Occupational History  . [place] [employer]    Social History Main Topics  . Smoking status: Former Smoker    Quit date: 04/24/1978  . Smokeless tobacco: Not on file  . Alcohol Use: 1.2 oz/week    2 Standard drinks or equivalent per week  . Drug Use: No  . Sexual Activity: Not on file   Other Topics Concern  . Not on file   Social History Narrative   Some regular exercise.     Family History  Problem Relation Age of Onset  . Heart disease Father 61    bypass surgery  . Stroke Mother 83  . Cancer Mother     upper palate    Outpatient Encounter Prescriptions as of 08/14/2015  Medication Sig  .  amiodarone (PACERONE) 200 MG tablet Take 200 mg by mouth daily.   Marland Kitchen aspirin 81 MG tablet Two tablets by mouth daily  . co-enzyme Q-10 30 MG capsule Take 30 mg by mouth.  . fenofibrate 160 MG tablet Take 1 tablet (160 mg total) by mouth daily.  . fluticasone (FLONASE) 50 MCG/ACT nasal spray Place 2 sprays into both nostrils daily.  . imiquimod (ALDARA) 5 % cream Apply 5 x per week x 6 weeks.  . metoprolol tartrate (LOPRESSOR) 25 MG tablet Take 25 mg by mouth 2 (two) times daily.  . Omega-3 Fatty Acids (FISH OIL) 1000 MG CAPS Take by mouth.  . terazosin (HYTRIN) 2 MG capsule Take 1 capsule (2 mg total) by mouth at bedtime.  . tolterodine (DETROL LA) 2 MG 24 hr capsule Take 1 capsule (2 mg total) by mouth daily.  . traMADol (ULTRAM) 50 MG tablet TAKE 1 TABLET EVERY 8 HOURS AS NEEDED  FOR  PAIN  . [DISCONTINUED] losartan (COZAAR) 50 MG tablet  Take 1 tablet (50 mg total) by mouth daily.  . [DISCONTINUED] pravastatin (PRAVACHOL) 20 MG tablet Take 1 tablet (20 mg total) by mouth at bedtime.  . [DISCONTINUED] simvastatin (ZOCOR) 40 MG tablet Take 1 tablet (40 mg total) by mouth at bedtime.  Marland Kitchen atorvastatin (LIPITOR) 40 MG tablet Take 1 tablet (40 mg total) by mouth at bedtime.  . [DISCONTINUED] amLODipine (NORVASC) 2.5 MG tablet Take 1 tablet (2.5 mg total) by mouth daily.  . [DISCONTINUED] meloxicam (MOBIC) 15 MG tablet TAKE 1 TABLET DAILY AS NEEDED FOR PAIN.  . [DISCONTINUED] propranolol (INDERAL) 10 MG tablet TAKE 1 TO 2 TABLETS EVERY DAY AS NEEDED   No facility-administered encounter medications on file as of 08/14/2015.          Objective:   Physical Exam  Constitutional: He is oriented to person, place, and time. He appears well-developed and well-nourished.  HENT:  Head: Normocephalic and atraumatic.  Cardiovascular: Normal rate, regular rhythm and normal heart sounds.   Pulmonary/Chest: Effort normal and breath sounds normal.  He still has some slight crackles at the bases bilaterally.  Musculoskeletal: He exhibits no edema.  Neurological: He is alert and oriented to person, place, and time.  Skin: Skin is warm and dry.  Psychiatric: He has a normal mood and affect. His behavior is normal.          Assessment & Plan:  Status post aortic root replacement and aneurysmal repair of the ascending aorta-he is overall doing well. He has lost a fair amount of weight but it sounds like he is getting his appetite back very slowly. She is going to take several weeks if not months to completely recover but I think he actually is doing very well. Continue taper with amiodarone. He section specimen down to 1 tab a day at this point. He does have some crackles at the base of the lungs bilaterally. No other signs of volume overload. Discussed that he needs to start using his incentive spirometry her again which she has not been using.  The crackles could be from a little bit of atelectasis versus a little bit of pulmonary edema.  coronary artery disease, s/p CABG x 3 -he's taking 2 baby aspirin currently. He is now on metoprolol so we sent a new prescription to the pharmacy. Remove the propranolol from his list. Also wets discontinue the simvastatin and/or pravastatin and switch to atorvastatin which has a little bit better data. New prescription sent to the pharmacy.  Medication review-we took some time to clean up his medication list so that it matches and is consistent.  Insomnia-he is to try Tylenol PM over-the-counter to help him sleep. If he does need some been a little stronger just short-term then please let me know. He is no longer taking pain medication which is good.  Loss of appetite-induced encourage him to try to eat consistently to keep his nourishment up for healing. But the appetite should return slowly over the next few weeks.  Atypical lesion near left ear on the face-recommend shave biopsy for further evaluation. He will schedule at a later date.

## 2015-08-15 ENCOUNTER — Encounter: Payer: Self-pay | Admitting: Family Medicine

## 2015-08-21 DIAGNOSIS — Z79891 Long term (current) use of opiate analgesic: Secondary | ICD-10-CM | POA: Diagnosis not present

## 2015-08-21 DIAGNOSIS — Z48813 Encounter for surgical aftercare following surgery on the respiratory system: Secondary | ICD-10-CM | POA: Diagnosis not present

## 2015-08-21 DIAGNOSIS — Z7952 Long term (current) use of systemic steroids: Secondary | ICD-10-CM | POA: Diagnosis not present

## 2015-08-21 DIAGNOSIS — Z952 Presence of prosthetic heart valve: Secondary | ICD-10-CM | POA: Diagnosis not present

## 2015-08-21 DIAGNOSIS — Z7982 Long term (current) use of aspirin: Secondary | ICD-10-CM | POA: Diagnosis not present

## 2015-08-21 DIAGNOSIS — Z79899 Other long term (current) drug therapy: Secondary | ICD-10-CM | POA: Diagnosis not present

## 2015-08-21 DIAGNOSIS — Z951 Presence of aortocoronary bypass graft: Secondary | ICD-10-CM | POA: Diagnosis not present

## 2015-08-25 ENCOUNTER — Other Ambulatory Visit: Payer: Self-pay | Admitting: Family Medicine

## 2015-08-25 MED ORDER — METOPROLOL TARTRATE 25 MG PO TABS: 25.0000 mg | ORAL_TABLET | Freq: Two times a day (BID) | ORAL | Status: DC

## 2015-09-07 HISTORY — PX: A-FLUTTER ABLATION: EP1230

## 2015-09-11 ENCOUNTER — Ambulatory Visit: Payer: Commercial Managed Care - HMO | Admitting: Family Medicine

## 2015-09-11 DIAGNOSIS — Z79899 Other long term (current) drug therapy: Secondary | ICD-10-CM | POA: Diagnosis not present

## 2015-09-11 DIAGNOSIS — I4892 Unspecified atrial flutter: Secondary | ICD-10-CM | POA: Diagnosis not present

## 2015-09-11 DIAGNOSIS — Z951 Presence of aortocoronary bypass graft: Secondary | ICD-10-CM | POA: Diagnosis not present

## 2015-09-15 DIAGNOSIS — Z951 Presence of aortocoronary bypass graft: Secondary | ICD-10-CM | POA: Diagnosis not present

## 2015-09-15 DIAGNOSIS — I10 Essential (primary) hypertension: Secondary | ICD-10-CM | POA: Diagnosis not present

## 2015-09-15 DIAGNOSIS — I483 Typical atrial flutter: Secondary | ICD-10-CM | POA: Diagnosis not present

## 2015-09-15 DIAGNOSIS — Z952 Presence of prosthetic heart valve: Secondary | ICD-10-CM | POA: Diagnosis not present

## 2015-09-15 DIAGNOSIS — I2510 Atherosclerotic heart disease of native coronary artery without angina pectoris: Secondary | ICD-10-CM | POA: Diagnosis not present

## 2015-09-15 DIAGNOSIS — Z8679 Personal history of other diseases of the circulatory system: Secondary | ICD-10-CM | POA: Insufficient documentation

## 2015-09-15 DIAGNOSIS — I4892 Unspecified atrial flutter: Secondary | ICD-10-CM | POA: Insufficient documentation

## 2015-09-16 ENCOUNTER — Encounter: Payer: Self-pay | Admitting: Family Medicine

## 2015-09-16 ENCOUNTER — Ambulatory Visit (INDEPENDENT_AMBULATORY_CARE_PROVIDER_SITE_OTHER): Payer: Commercial Managed Care - HMO | Admitting: Family Medicine

## 2015-09-16 VITALS — BP 91/75 | HR 100 | Wt 192.0 lb

## 2015-09-16 DIAGNOSIS — D229 Melanocytic nevi, unspecified: Secondary | ICD-10-CM

## 2015-09-16 DIAGNOSIS — L570 Actinic keratosis: Secondary | ICD-10-CM | POA: Diagnosis not present

## 2015-09-16 NOTE — Progress Notes (Signed)
   Subjective:    Patient ID: Todd Parsons, male    DOB: [birth year], 70 y.o.   MRN: [id number]  HPI Patient comes in today for shave biopsy on lesion on the left face near his ear. It is been there for quite some time. We have tried to treat the area with Aldara cream as it had the appearance of a possible basal cell. He does feel like it's gotten larger. No pain or irritation or bleeding.  He did want to update me on some medication changes today. They're still working on trying to get his heart rate under control. They decreased his amiodarone to 400 mg daily and increase his Toprol to 50 mg twice a day. His blood pressure is still low and his heart is still tachycardic today.  Review of Systems     Objective:   Physical Exam  Constitutional: He is oriented to person, place, and time. He appears well-developed and well-nourished.  HENT:  Head: Normocephalic and atraumatic.  Eyes: Conjunctivae and EOM are normal.  Cardiovascular: Normal rate.   Pulmonary/Chest: Effort normal.  Neurological: He is alert and oriented to person, place, and time.  Skin: Skin is dry. No pallor.  He has a pink oval-shaped papular lesion on the left jawline just anterior to the ear. It's approximately 5 x 7 mm in size.  Psychiatric: He has a normal mood and affect. His behavior is normal.  Vitals reviewed.         Assessment & Plan:  Atypical nevus-shave biopsy performed. Patient tolerated well. Reviewed wound care. Call and return if any concerns about healing. Call with report once available.  Shave Biopsy Procedure Note  Pre-operative Diagnosis: Suspicious lesion  Post-operative Diagnosis: same  Locations:right face near jaw line and ear  Indications: getting larger  Anesthesia: Lidocaine 1% with epinephrine without added sodium bicarbonate  Procedure Details  History of allergy to iodine: no  Patient informed of the risks (including bleeding and infection) and benefits of the   procedure and Verbal informed consent obtained.  The lesion and surrounding area were given a sterile prep using chlorhexidine and draped in the usual sterile fashion. A scalpel was used to shave an area of skin approximately 3mm by 1mm.  Hemostasis achieved with alumuninum chloride. Antibiotic ointment and a sterile dressing applied.  The specimen was sent for pathologic examination. The patient tolerated the procedure well.  EBL: 0 ml  Findings: await pathology  Condition: Stable  Complications: none.  Plan: 1. Instructed to keep the wound dry and covered for 24-48h and clean thereafter. 2. Warning signs of infection were reviewed.   3. Recommended that the patient use OTC acetaminophen as needed for pain.  4. Return PRN.

## 2015-09-17 ENCOUNTER — Other Ambulatory Visit: Payer: Self-pay | Admitting: Family Medicine

## 2015-09-19 DIAGNOSIS — Z7901 Long term (current) use of anticoagulants: Secondary | ICD-10-CM | POA: Diagnosis not present

## 2015-09-19 DIAGNOSIS — I2510 Atherosclerotic heart disease of native coronary artery without angina pectoris: Secondary | ICD-10-CM | POA: Diagnosis not present

## 2015-09-19 DIAGNOSIS — I483 Typical atrial flutter: Secondary | ICD-10-CM | POA: Diagnosis not present

## 2015-09-19 DIAGNOSIS — Z952 Presence of prosthetic heart valve: Secondary | ICD-10-CM | POA: Diagnosis not present

## 2015-09-19 DIAGNOSIS — Z951 Presence of aortocoronary bypass graft: Secondary | ICD-10-CM | POA: Diagnosis not present

## 2015-09-24 ENCOUNTER — Telehealth: Payer: Self-pay | Admitting: Family Medicine

## 2015-09-24 DIAGNOSIS — Z951 Presence of aortocoronary bypass graft: Secondary | ICD-10-CM | POA: Diagnosis not present

## 2015-09-24 DIAGNOSIS — I2510 Atherosclerotic heart disease of native coronary artery without angina pectoris: Secondary | ICD-10-CM | POA: Diagnosis not present

## 2015-09-24 DIAGNOSIS — Z952 Presence of prosthetic heart valve: Secondary | ICD-10-CM | POA: Diagnosis not present

## 2015-09-24 DIAGNOSIS — Z7901 Long term (current) use of anticoagulants: Secondary | ICD-10-CM | POA: Diagnosis not present

## 2015-09-24 DIAGNOSIS — I483 Typical atrial flutter: Secondary | ICD-10-CM | POA: Diagnosis not present

## 2015-09-24 NOTE — Telephone Encounter (Signed)
Received a fax for silverback authorization to Dr. Gillian Scarce office at [facility]. I sent it through silverback and I am waiting on authorization.

## 2015-09-24 NOTE — Telephone Encounter (Signed)
Received Silverback authorization [id number] good for 6 visits from 09/24/2015 - 03/22/2016. - CF

## 2015-09-26 DIAGNOSIS — Z951 Presence of aortocoronary bypass graft: Secondary | ICD-10-CM | POA: Diagnosis not present

## 2015-09-26 DIAGNOSIS — Z7901 Long term (current) use of anticoagulants: Secondary | ICD-10-CM | POA: Diagnosis not present

## 2015-09-26 DIAGNOSIS — I483 Typical atrial flutter: Secondary | ICD-10-CM | POA: Diagnosis not present

## 2015-09-26 DIAGNOSIS — I2510 Atherosclerotic heart disease of native coronary artery without angina pectoris: Secondary | ICD-10-CM | POA: Diagnosis not present

## 2015-09-26 DIAGNOSIS — Z952 Presence of prosthetic heart valve: Secondary | ICD-10-CM | POA: Diagnosis not present

## 2015-09-29 DIAGNOSIS — Z951 Presence of aortocoronary bypass graft: Secondary | ICD-10-CM | POA: Diagnosis not present

## 2015-09-29 DIAGNOSIS — Z952 Presence of prosthetic heart valve: Secondary | ICD-10-CM | POA: Diagnosis not present

## 2015-09-29 DIAGNOSIS — I483 Typical atrial flutter: Secondary | ICD-10-CM | POA: Diagnosis not present

## 2015-09-30 DIAGNOSIS — I351 Nonrheumatic aortic (valve) insufficiency: Secondary | ICD-10-CM | POA: Diagnosis not present

## 2015-09-30 DIAGNOSIS — I483 Typical atrial flutter: Secondary | ICD-10-CM | POA: Diagnosis not present

## 2015-09-30 DIAGNOSIS — Q231 Congenital insufficiency of aortic valve: Secondary | ICD-10-CM | POA: Diagnosis not present

## 2015-09-30 DIAGNOSIS — Z951 Presence of aortocoronary bypass graft: Secondary | ICD-10-CM | POA: Diagnosis not present

## 2015-09-30 DIAGNOSIS — I2510 Atherosclerotic heart disease of native coronary artery without angina pectoris: Secondary | ICD-10-CM | POA: Diagnosis not present

## 2015-09-30 DIAGNOSIS — Z952 Presence of prosthetic heart valve: Secondary | ICD-10-CM | POA: Diagnosis not present

## 2015-10-03 DIAGNOSIS — Z951 Presence of aortocoronary bypass graft: Secondary | ICD-10-CM | POA: Diagnosis not present

## 2015-10-03 DIAGNOSIS — I2510 Atherosclerotic heart disease of native coronary artery without angina pectoris: Secondary | ICD-10-CM | POA: Diagnosis not present

## 2015-10-03 DIAGNOSIS — Z952 Presence of prosthetic heart valve: Secondary | ICD-10-CM | POA: Diagnosis not present

## 2015-10-03 DIAGNOSIS — Z7901 Long term (current) use of anticoagulants: Secondary | ICD-10-CM | POA: Diagnosis not present

## 2015-10-03 DIAGNOSIS — I483 Typical atrial flutter: Secondary | ICD-10-CM | POA: Diagnosis not present

## 2015-10-09 DIAGNOSIS — I483 Typical atrial flutter: Secondary | ICD-10-CM | POA: Diagnosis not present

## 2015-10-09 DIAGNOSIS — Z952 Presence of prosthetic heart valve: Secondary | ICD-10-CM | POA: Diagnosis not present

## 2015-10-09 DIAGNOSIS — Z951 Presence of aortocoronary bypass graft: Secondary | ICD-10-CM | POA: Diagnosis not present

## 2015-10-09 DIAGNOSIS — I2510 Atherosclerotic heart disease of native coronary artery without angina pectoris: Secondary | ICD-10-CM | POA: Diagnosis not present

## 2015-10-09 DIAGNOSIS — Z7901 Long term (current) use of anticoagulants: Secondary | ICD-10-CM | POA: Diagnosis not present

## 2015-10-13 ENCOUNTER — Other Ambulatory Visit: Payer: Self-pay | Admitting: *Deleted

## 2015-10-13 DIAGNOSIS — I483 Typical atrial flutter: Secondary | ICD-10-CM

## 2015-10-14 DIAGNOSIS — Z79899 Other long term (current) drug therapy: Secondary | ICD-10-CM | POA: Diagnosis not present

## 2015-10-14 DIAGNOSIS — I4581 Long QT syndrome: Secondary | ICD-10-CM | POA: Diagnosis not present

## 2015-10-14 DIAGNOSIS — I483 Typical atrial flutter: Secondary | ICD-10-CM | POA: Diagnosis not present

## 2015-10-14 DIAGNOSIS — Z951 Presence of aortocoronary bypass graft: Secondary | ICD-10-CM | POA: Diagnosis not present

## 2015-10-14 DIAGNOSIS — Q231 Congenital insufficiency of aortic valve: Secondary | ICD-10-CM | POA: Diagnosis not present

## 2015-10-14 DIAGNOSIS — I4892 Unspecified atrial flutter: Secondary | ICD-10-CM | POA: Diagnosis not present

## 2015-10-14 DIAGNOSIS — Z87891 Personal history of nicotine dependence: Secondary | ICD-10-CM | POA: Diagnosis not present

## 2015-10-14 DIAGNOSIS — Z7982 Long term (current) use of aspirin: Secondary | ICD-10-CM | POA: Diagnosis not present

## 2015-10-17 ENCOUNTER — Other Ambulatory Visit: Payer: Self-pay | Admitting: Family Medicine

## 2015-10-17 DIAGNOSIS — Q231 Congenital insufficiency of aortic valve: Secondary | ICD-10-CM | POA: Diagnosis not present

## 2015-10-17 DIAGNOSIS — I4892 Unspecified atrial flutter: Secondary | ICD-10-CM | POA: Diagnosis not present

## 2015-10-17 DIAGNOSIS — Z952 Presence of prosthetic heart valve: Secondary | ICD-10-CM | POA: Diagnosis not present

## 2015-10-17 DIAGNOSIS — N400 Enlarged prostate without lower urinary tract symptoms: Secondary | ICD-10-CM | POA: Diagnosis not present

## 2015-10-17 DIAGNOSIS — G4733 Obstructive sleep apnea (adult) (pediatric): Secondary | ICD-10-CM | POA: Diagnosis not present

## 2015-10-17 DIAGNOSIS — Z7901 Long term (current) use of anticoagulants: Secondary | ICD-10-CM

## 2015-10-17 DIAGNOSIS — I2510 Atherosclerotic heart disease of native coronary artery without angina pectoris: Secondary | ICD-10-CM | POA: Diagnosis not present

## 2015-10-17 DIAGNOSIS — I351 Nonrheumatic aortic (valve) insufficiency: Secondary | ICD-10-CM | POA: Diagnosis not present

## 2015-10-17 DIAGNOSIS — Z951 Presence of aortocoronary bypass graft: Secondary | ICD-10-CM | POA: Diagnosis not present

## 2015-10-17 DIAGNOSIS — Z87891 Personal history of nicotine dependence: Secondary | ICD-10-CM | POA: Diagnosis not present

## 2015-10-17 DIAGNOSIS — I10 Essential (primary) hypertension: Secondary | ICD-10-CM | POA: Diagnosis not present

## 2015-10-17 DIAGNOSIS — E785 Hyperlipidemia, unspecified: Secondary | ICD-10-CM | POA: Diagnosis not present

## 2015-10-17 DIAGNOSIS — I483 Typical atrial flutter: Secondary | ICD-10-CM | POA: Diagnosis not present

## 2015-10-20 DIAGNOSIS — E785 Hyperlipidemia, unspecified: Secondary | ICD-10-CM | POA: Diagnosis not present

## 2015-10-20 DIAGNOSIS — I10 Essential (primary) hypertension: Secondary | ICD-10-CM | POA: Diagnosis not present

## 2015-10-20 DIAGNOSIS — Q231 Congenital insufficiency of aortic valve: Secondary | ICD-10-CM | POA: Diagnosis not present

## 2015-10-20 DIAGNOSIS — Z87891 Personal history of nicotine dependence: Secondary | ICD-10-CM | POA: Diagnosis not present

## 2015-10-20 DIAGNOSIS — I4892 Unspecified atrial flutter: Secondary | ICD-10-CM | POA: Diagnosis not present

## 2015-10-20 DIAGNOSIS — G4733 Obstructive sleep apnea (adult) (pediatric): Secondary | ICD-10-CM | POA: Diagnosis not present

## 2015-10-20 DIAGNOSIS — I351 Nonrheumatic aortic (valve) insufficiency: Secondary | ICD-10-CM | POA: Diagnosis not present

## 2015-10-20 DIAGNOSIS — I483 Typical atrial flutter: Secondary | ICD-10-CM | POA: Diagnosis not present

## 2015-10-20 DIAGNOSIS — N400 Enlarged prostate without lower urinary tract symptoms: Secondary | ICD-10-CM | POA: Diagnosis not present

## 2015-10-21 NOTE — Addendum Note (Signed)
Addended by: Huel Cote on: 10/21/2015 03:22 PM   Modules accepted: Orders

## 2015-10-23 ENCOUNTER — Other Ambulatory Visit: Payer: Self-pay | Admitting: Family Medicine

## 2015-10-24 DIAGNOSIS — I483 Typical atrial flutter: Secondary | ICD-10-CM | POA: Diagnosis not present

## 2015-10-24 DIAGNOSIS — Z951 Presence of aortocoronary bypass graft: Secondary | ICD-10-CM | POA: Diagnosis not present

## 2015-10-24 DIAGNOSIS — I2510 Atherosclerotic heart disease of native coronary artery without angina pectoris: Secondary | ICD-10-CM | POA: Diagnosis not present

## 2015-10-24 DIAGNOSIS — Z7901 Long term (current) use of anticoagulants: Secondary | ICD-10-CM | POA: Diagnosis not present

## 2015-10-24 DIAGNOSIS — Z952 Presence of prosthetic heart valve: Secondary | ICD-10-CM | POA: Diagnosis not present

## 2015-10-28 ENCOUNTER — Telehealth: Payer: Self-pay | Admitting: *Deleted

## 2015-10-28 NOTE — Telephone Encounter (Signed)
Pt called and lvm stating that his rx for detrol is $131. I advised that he contact his insurance company to see what is covered and call back with this information.Audelia Hives [place]

## 2015-10-30 ENCOUNTER — Other Ambulatory Visit: Payer: Self-pay | Admitting: Family Medicine

## 2015-11-07 DIAGNOSIS — Z7901 Long term (current) use of anticoagulants: Secondary | ICD-10-CM | POA: Diagnosis not present

## 2015-11-07 DIAGNOSIS — Z952 Presence of prosthetic heart valve: Secondary | ICD-10-CM | POA: Diagnosis not present

## 2015-11-07 DIAGNOSIS — Z951 Presence of aortocoronary bypass graft: Secondary | ICD-10-CM | POA: Diagnosis not present

## 2015-11-07 DIAGNOSIS — I483 Typical atrial flutter: Secondary | ICD-10-CM | POA: Diagnosis not present

## 2015-11-07 DIAGNOSIS — I2510 Atherosclerotic heart disease of native coronary artery without angina pectoris: Secondary | ICD-10-CM | POA: Diagnosis not present

## 2015-11-17 DIAGNOSIS — I483 Typical atrial flutter: Secondary | ICD-10-CM | POA: Diagnosis not present

## 2015-11-17 DIAGNOSIS — Z9889 Other specified postprocedural states: Secondary | ICD-10-CM | POA: Diagnosis not present

## 2015-11-17 DIAGNOSIS — I2510 Atherosclerotic heart disease of native coronary artery without angina pectoris: Secondary | ICD-10-CM | POA: Diagnosis not present

## 2015-11-17 DIAGNOSIS — I480 Paroxysmal atrial fibrillation: Secondary | ICD-10-CM | POA: Diagnosis not present

## 2015-11-17 DIAGNOSIS — Z953 Presence of xenogenic heart valve: Secondary | ICD-10-CM | POA: Diagnosis not present

## 2015-11-17 DIAGNOSIS — Q231 Congenital insufficiency of aortic valve: Secondary | ICD-10-CM | POA: Diagnosis not present

## 2015-11-17 DIAGNOSIS — I10 Essential (primary) hypertension: Secondary | ICD-10-CM | POA: Diagnosis not present

## 2015-11-17 DIAGNOSIS — Z951 Presence of aortocoronary bypass graft: Secondary | ICD-10-CM | POA: Diagnosis not present

## 2015-11-17 DIAGNOSIS — Z87891 Personal history of nicotine dependence: Secondary | ICD-10-CM | POA: Diagnosis not present

## 2015-11-20 DIAGNOSIS — Z952 Presence of prosthetic heart valve: Secondary | ICD-10-CM | POA: Diagnosis not present

## 2015-11-20 DIAGNOSIS — I2510 Atherosclerotic heart disease of native coronary artery without angina pectoris: Secondary | ICD-10-CM | POA: Diagnosis not present

## 2015-11-20 DIAGNOSIS — Z7901 Long term (current) use of anticoagulants: Secondary | ICD-10-CM | POA: Diagnosis not present

## 2015-11-20 DIAGNOSIS — I483 Typical atrial flutter: Secondary | ICD-10-CM | POA: Diagnosis not present

## 2015-11-20 DIAGNOSIS — Z951 Presence of aortocoronary bypass graft: Secondary | ICD-10-CM | POA: Diagnosis not present

## 2015-12-09 DIAGNOSIS — Z951 Presence of aortocoronary bypass graft: Secondary | ICD-10-CM | POA: Diagnosis not present

## 2015-12-09 DIAGNOSIS — Z7901 Long term (current) use of anticoagulants: Secondary | ICD-10-CM | POA: Diagnosis not present

## 2015-12-09 DIAGNOSIS — I2510 Atherosclerotic heart disease of native coronary artery without angina pectoris: Secondary | ICD-10-CM | POA: Diagnosis not present

## 2015-12-09 DIAGNOSIS — Z952 Presence of prosthetic heart valve: Secondary | ICD-10-CM | POA: Diagnosis not present

## 2015-12-09 DIAGNOSIS — I483 Typical atrial flutter: Secondary | ICD-10-CM | POA: Diagnosis not present

## 2015-12-10 ENCOUNTER — Ambulatory Visit (INDEPENDENT_AMBULATORY_CARE_PROVIDER_SITE_OTHER): Payer: Commercial Managed Care - HMO

## 2015-12-10 ENCOUNTER — Ambulatory Visit (INDEPENDENT_AMBULATORY_CARE_PROVIDER_SITE_OTHER): Payer: Commercial Managed Care - HMO | Admitting: Family Medicine

## 2015-12-10 ENCOUNTER — Encounter: Payer: Self-pay | Admitting: Family Medicine

## 2015-12-10 VITALS — BP 128/78 | HR 67 | Temp 98.6°F | Wt 188.0 lb

## 2015-12-10 DIAGNOSIS — R0989 Other specified symptoms and signs involving the circulatory and respiratory systems: Secondary | ICD-10-CM

## 2015-12-10 DIAGNOSIS — I517 Cardiomegaly: Secondary | ICD-10-CM

## 2015-12-10 DIAGNOSIS — Z7901 Long term (current) use of anticoagulants: Secondary | ICD-10-CM

## 2015-12-10 DIAGNOSIS — J069 Acute upper respiratory infection, unspecified: Secondary | ICD-10-CM | POA: Diagnosis not present

## 2015-12-10 DIAGNOSIS — R05 Cough: Secondary | ICD-10-CM | POA: Diagnosis not present

## 2015-12-10 MED ORDER — PREDNISONE 10 MG PO TABS
30.0000 mg | ORAL_TABLET | Freq: Every day | ORAL | Status: DC
Start: 2015-12-10 — End: 2015-12-17

## 2015-12-10 MED ORDER — IPRATROPIUM BROMIDE 0.06 % NA SOLN: 2.0000 | NASAL | Status: DC | PRN

## 2015-12-10 NOTE — Assessment & Plan Note (Addendum)
Nurse Visit for INR check in 2 days

## 2015-12-10 NOTE — Assessment & Plan Note (Signed)
Likely related to underlying restrictive lung disease. Possible pulmonary edema. Awaiting formal radiology review. Patient had been taking amiodarone and these lung scars could be related to that although he is no longer on this medication. Follow-up with PCP. If Pulmonary edema will call in Lasix.

## 2015-12-10 NOTE — Progress Notes (Signed)
Todd Parsons is a 70 y.o. male who presents to [place] today for sore throat and runny nose. Patient notes a 2 day history of sore throat and runny nose and chills. He was exposed to a sick grandchild recently. He notes a mild cough that's not particularly bothersome. He denies any wheezing chest pain or shortness of breath. He denies any lower leg swelling.   Past Medical History  Diagnosis Date  . Hypertension   . Hyperlipidemia   . Echocardiogram with ECG monitoring 01/12/2010    60-65% EF  . Bicuspid aortic valve    Past Surgical History  Procedure Laterality Date  . Left knee arthoscopy    . Apnea device implant      then removed.    . Tonsilectomy, adenoidectomy, bilateral myringotomy and tubes    . Acromio-clavicular joint repair Right 1980s        Social History  Substance Use Topics  . Smoking status: Former Smoker    Quit date: 04/24/1978  . Smokeless tobacco: Not on file  . Alcohol Use: 1.2 oz/week    2 Standard drinks or equivalent per week   family history includes Cancer in his mother; Heart disease (age of onset: 87) in his father; Stroke (age of onset: 34) in his mother.  ROS as above Medications: Current Outpatient Prescriptions  Medication Sig Dispense Refill  . amLODipine (NORVASC) 2.5 MG tablet TAKE 1 TABLET EVERY DAY 90 tablet 3  . aspirin 81 MG tablet Two tablets by mouth daily    . atorvastatin (LIPITOR) 40 MG tablet Take 1 tablet (40 mg total) by mouth at bedtime. 90 tablet 3  . co-enzyme Q-10 30 MG capsule Take 30 mg by mouth.    . fenofibrate 160 MG tablet TAKE 1 TABLET EVERY DAY 90 tablet 2  . fluticasone (FLONASE) 50 MCG/ACT nasal spray Place 2 sprays into both nostrils daily. 48 g 3  . metoprolol (LOPRESSOR) 50 MG tablet Take 50 mg by mouth 2 (two) times daily.    . Omega-3 Fatty Acids (FISH OIL) 1000 MG CAPS Take by mouth.    .  terazosin (HYTRIN) 2 MG capsule Take 1 capsule (2 mg total) by mouth at bedtime. FOLLOW UP APPOINTMENT NEEDED FOR FURTHER REFILLS 90 capsule 0  . warfarin (COUMADIN) 5 MG tablet Take 5 mg by mouth daily.    Marland Kitchen ipratropium (ATROVENT) 0.06 % nasal spray Place 2 sprays into both nostrils every 4 (four) hours as needed for rhinitis. 10 mL 6  . predniSONE (DELTASONE) 10 MG tablet Take 3 tablets (30 mg total) by mouth daily with breakfast. 15 tablet 0   No current facility-administered medications for this visit.   Allergies  Allergen Reactions  . Simvastatin Other (See Comments)    Myalgias     Exam:  BP 128/78 mmHg  Pulse 67  Temp(Src) 98.6 F (37 C) (Oral)  Wt 188 lb (85.276 kg)  SpO2 96% Gen: Well NAD Nontoxic appearing HEENT: EOMI,  MMM Posterior pharynx with cobblestoning. Normal tympanic membranes. Clear nasal discharge. No significant JVD Lungs: Normal work of breathing. Bibasilar crackles are present Heart: RRR no MRG Abd: NABS, Soft. Nondistended, Nontender Exts: Brisk capillary refill, warm and well perfused.  No edema bilateral lower extremities  Chest x-ray shows scarring versus pulmonary edema in the bibasilar area. Not much change from previous chest x-ray. Awaiting formal radiology review.  No results found for this or any previous visit (  from the past 24 hour(s)). No results found.   Please see individual assessment and plan sections.

## 2015-12-10 NOTE — Progress Notes (Signed)
Quick Note:  Chest x-ray shows scarring/fibrosis but no pneumonia or fluid on the lungs. Follow-up with your primary care doctor soon to address these issues. ______

## 2015-12-10 NOTE — Patient Instructions (Signed)
Thank you for coming in today. Use the nasal spray and take prednisone.  We will call with results.  Return Friday for a nurse visit to check the warfarin level (INR).  Call or go to the emergency room if you get worse, have trouble breathing, have chest pains, or palpitations.   Acute Bronchitis Bronchitis is inflammation of the airways that extend from the windpipe into the lungs (bronchi). The inflammation often causes mucus to develop. This leads to a cough, which is the most common symptom of bronchitis.  In acute bronchitis, the condition usually develops suddenly and goes away over time, usually in a couple weeks. Smoking, allergies, and asthma can make bronchitis worse. Repeated episodes of bronchitis may cause further lung problems.  CAUSES Acute bronchitis is most often caused by the same virus that causes a cold. The virus can spread from person to person (contagious) through coughing, sneezing, and touching contaminated objects. SIGNS AND SYMPTOMS   Cough.   Fever.   Coughing up mucus.   Body aches.   Chest congestion.   Chills.   Shortness of breath.   Sore throat.  DIAGNOSIS  Acute bronchitis is usually diagnosed through a physical exam. Your health care provider will also ask you questions about your medical history. Tests, such as chest X-rays, are sometimes done to rule out other conditions.  TREATMENT  Acute bronchitis usually goes away in a couple weeks. Oftentimes, no medical treatment is necessary. Medicines are sometimes given for relief of fever or cough. Antibiotic medicines are usually not needed but may be prescribed in certain situations. In some cases, an inhaler may be recommended to help reduce shortness of breath and control the cough. A cool mist vaporizer may also be used to help thin bronchial secretions and make it easier to clear the chest.  HOME CARE INSTRUCTIONS  Get plenty of rest.   Drink enough fluids to keep your urine clear or  pale yellow (unless you have a medical condition that requires fluid restriction). Increasing fluids may help thin your respiratory secretions (sputum) and reduce chest congestion, and it will prevent dehydration.   Take medicines only as directed by your health care provider.  If you were prescribed an antibiotic medicine, finish it all even if you start to feel better.  Avoid smoking and secondhand smoke. Exposure to cigarette smoke or irritating chemicals will make bronchitis worse. If you are a smoker, consider using nicotine gum or skin patches to help control withdrawal symptoms. Quitting smoking will help your lungs heal faster.   Reduce the chances of another bout of acute bronchitis by washing your hands frequently, avoiding people with cold symptoms, and trying not to touch your hands to your mouth, nose, or eyes.   Keep all follow-up visits as directed by your health care provider.  SEEK MEDICAL CARE IF: Your symptoms do not improve after 1 week of treatment.  SEEK IMMEDIATE MEDICAL CARE IF:  You develop an increased fever or chills.   You have chest pain.   You have severe shortness of breath.  You have bloody sputum.   You develop dehydration.  You faint or repeatedly feel like you are going to pass out.  You develop repeated vomiting.  You develop a severe headache. MAKE SURE YOU:   Understand these instructions.  Will watch your condition.  Will get help right away if you are not doing well or get worse.   This information is not intended to replace advice given to you by  your health care provider. Make sure you discuss any questions you have with your health care provider.   Document Released: 09/30/2004 Document Revised: 09/13/2014 Document Reviewed: 02/13/2013 Elsevier Interactive Patient Education [employer].

## 2015-12-10 NOTE — Assessment & Plan Note (Signed)
Symptoms most likely related to viral URI. Awaiting formal radiology review of chest x-ray. Doubtful for bacterial cause. Treat empirically with prednisone and Atrovent nasal spray.

## 2015-12-12 ENCOUNTER — Ambulatory Visit (INDEPENDENT_AMBULATORY_CARE_PROVIDER_SITE_OTHER): Payer: Commercial Managed Care - HMO | Admitting: Family Medicine

## 2015-12-12 ENCOUNTER — Ambulatory Visit: Payer: Commercial Managed Care - HMO

## 2015-12-12 VITALS — BP 119/86 | HR 61

## 2015-12-12 DIAGNOSIS — Z7901 Long term (current) use of anticoagulants: Secondary | ICD-10-CM

## 2015-12-12 LAB — POCT INR: INR: 1.8

## 2015-12-17 ENCOUNTER — Telehealth: Payer: Self-pay

## 2015-12-17 MED ORDER — PREDNISONE 10 MG PO TABS: 30.0000 mg | ORAL_TABLET | Freq: Every day | ORAL | Status: DC

## 2015-12-17 MED ORDER — BENZONATATE 200 MG PO CAPS: 200.0000 mg | ORAL_CAPSULE | Freq: Three times a day (TID) | ORAL | Status: DC | PRN

## 2015-12-17 NOTE — Telephone Encounter (Signed)
Prednisone sent into pharmacy.  

## 2015-12-17 NOTE — Telephone Encounter (Signed)
I sent in tessalon to the pharmacy.  I am not sure if they will accept a Rx across state lines.

## 2015-12-17 NOTE — Telephone Encounter (Signed)
Pt called stating that the he does not feel that the benzonatate will help for his cough as he has tried this medication in the past. He would like to try another round of prednisone to be sent to:  [id number] - [place], [place] - [address] AT [address]. Please advise.

## 2015-12-17 NOTE — Telephone Encounter (Signed)
Todd Parsons states he is still coughing and has chills. He is currently out of town in [place] and cannot follow up in the office. He would like different medication for the cough. Denies fevers or sweats.

## 2015-12-22 ENCOUNTER — Ambulatory Visit (INDEPENDENT_AMBULATORY_CARE_PROVIDER_SITE_OTHER): Payer: Commercial Managed Care - HMO | Admitting: Family Medicine

## 2015-12-22 ENCOUNTER — Encounter: Payer: Self-pay | Admitting: Family Medicine

## 2015-12-22 ENCOUNTER — Ambulatory Visit: Payer: Self-pay | Admitting: Family Medicine

## 2015-12-22 VITALS — BP 107/70 | HR 68 | Ht 67.0 in | Wt 189.0 lb

## 2015-12-22 DIAGNOSIS — I483 Typical atrial flutter: Secondary | ICD-10-CM

## 2015-12-22 DIAGNOSIS — Z7901 Long term (current) use of anticoagulants: Secondary | ICD-10-CM

## 2015-12-22 DIAGNOSIS — Z954 Presence of other heart-valve replacement: Secondary | ICD-10-CM

## 2015-12-22 DIAGNOSIS — J8410 Pulmonary fibrosis, unspecified: Secondary | ICD-10-CM | POA: Insufficient documentation

## 2015-12-22 DIAGNOSIS — Z952 Presence of prosthetic heart valve: Secondary | ICD-10-CM

## 2015-12-22 DIAGNOSIS — J209 Acute bronchitis, unspecified: Secondary | ICD-10-CM | POA: Diagnosis not present

## 2015-12-22 DIAGNOSIS — R7301 Impaired fasting glucose: Secondary | ICD-10-CM | POA: Diagnosis not present

## 2015-12-22 DIAGNOSIS — J84112 Idiopathic pulmonary fibrosis: Secondary | ICD-10-CM | POA: Insufficient documentation

## 2015-12-22 LAB — POCT INR: INR: 4.1

## 2015-12-22 LAB — POCT GLYCOSYLATED HEMOGLOBIN (HGB A1C): HEMOGLOBIN A1C: 6.3

## 2015-12-22 MED ORDER — TERAZOSIN HCL 2 MG PO CAPS: 2.0000 mg | ORAL_CAPSULE | Freq: Every day | ORAL | Status: DC

## 2015-12-22 NOTE — Progress Notes (Addendum)
Pt here for INR check no missed doses, diet changes,bruising,bleeding,CP,SOB. He just finished prednisone on friday he takes 2.5 mg x 5 days 5 mg all other days.Todd Parsons [place]

## 2015-12-22 NOTE — Progress Notes (Addendum)
   Subjective:    Patient ID: Todd Parsons, male    DOB: [birth year], 70 y.o.   MRN: [id number]  HPI 70 year old male with a history of her strict of airway disease came in a proximally 2 weeks ago with viral upper respiratory symptoms. Cough and crackles on exam. He was started on prednisone and a chest x-ray was performed. 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 They're recommended to considering CT of the chest to evaluate further for interstitial lung disease. There was no active infiltrate or sign of acute congestive heart failure. Cough is better. Still some clear sputum.   IFG - No increased thirst urination.  Last A1c was 6.0.   History of aortic valve replacement-he would like to start having his Coumadin checked here. We checked it about a week ago after taking prednisone. He says this is more convenient than going into [place].  Review of Systems     Objective:   Physical Exam  Constitutional: He is oriented to person, place, and time. He appears well-developed and well-nourished.  HENT:  Head: Normocephalic and atraumatic.  Cardiovascular: Normal rate, regular rhythm and normal heart sounds.   Pulmonary/Chest: Effort normal and breath sounds normal.   crackles at bases bilaterally.   Neurological: He is alert and oriented to person, place, and time.  Skin: Skin is warm and dry.  Psychiatric: He has a normal mood and affect. His behavior is normal.          Assessment & Plan:  Cough/bronchitis - Most consistent with viral bronchitis. He does still at least 30-40% better than he did 2 weeks ago. Explained it can take another couple weeks for him to completely resolve. If not at least continuing to improve by the end of the week and please call me back and I'll place him on azithromycin. Continue with symptomatic care.   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 Will try to get copy of report for me.   IFG - Up a little bit  from previous. Continue work on diet and exercise and follow-up in 6 months. Lab Results  Component Value Date   HGBA1C 6.3 12/22/2015   History of aortic valve replacement-see Coumadin flow sheet for adjustments as his INR was elevated today.

## 2015-12-22 NOTE — Patient Instructions (Signed)
Try to get a copy of the CT report of your chest.

## 2016-01-05 ENCOUNTER — Ambulatory Visit (INDEPENDENT_AMBULATORY_CARE_PROVIDER_SITE_OTHER): Payer: Commercial Managed Care - HMO | Admitting: Family Medicine

## 2016-01-05 VITALS — BP 113/73 | HR 67

## 2016-01-05 DIAGNOSIS — Z7901 Long term (current) use of anticoagulants: Secondary | ICD-10-CM | POA: Diagnosis not present

## 2016-01-05 LAB — POCT INR: INR: 3.3

## 2016-01-05 NOTE — Progress Notes (Signed)
Same dose, recheck 2 weeks.

## 2016-01-05 NOTE — Progress Notes (Signed)
Pt notified of results, verbalized understanding. Will call to schedule 2 week follow up.

## 2016-01-19 ENCOUNTER — Ambulatory Visit (INDEPENDENT_AMBULATORY_CARE_PROVIDER_SITE_OTHER): Payer: Commercial Managed Care - HMO | Admitting: Family Medicine

## 2016-01-19 VITALS — BP 106/68 | HR 64

## 2016-01-19 DIAGNOSIS — Z7901 Long term (current) use of anticoagulants: Secondary | ICD-10-CM

## 2016-01-19 LAB — POCT INR: INR: 3.2

## 2016-01-19 NOTE — Progress Notes (Signed)
   Subjective:    Patient ID: Todd Parsons, male    DOB: [birth year], 70 y.o.   MRN: [id number] Pt notified to stay on current coumadin regimen & to return in 3 weeks for recheck.  Beatris Ship, CMA HPI    Review of Systems     Objective:   Physical Exam        Assessment & Plan:

## 2016-02-09 ENCOUNTER — Ambulatory Visit: Payer: Commercial Managed Care - HMO

## 2016-02-09 ENCOUNTER — Telehealth: Payer: Self-pay | Admitting: *Deleted

## 2016-02-09 NOTE — Telephone Encounter (Signed)
Pt reminded of appt on 02/10/16.Todd Parsons [place]

## 2016-02-10 ENCOUNTER — Telehealth: Payer: Self-pay | Admitting: *Deleted

## 2016-02-10 ENCOUNTER — Other Ambulatory Visit: Payer: Self-pay | Admitting: Family Medicine

## 2016-02-10 ENCOUNTER — Ambulatory Visit (INDEPENDENT_AMBULATORY_CARE_PROVIDER_SITE_OTHER): Payer: Commercial Managed Care - HMO | Admitting: Family Medicine

## 2016-02-10 ENCOUNTER — Encounter: Payer: Self-pay | Admitting: Family Medicine

## 2016-02-10 ENCOUNTER — Ambulatory Visit: Payer: Self-pay | Admitting: Family Medicine

## 2016-02-10 VITALS — BP 115/71 | HR 52 | Wt 192.0 lb

## 2016-02-10 DIAGNOSIS — I10 Essential (primary) hypertension: Secondary | ICD-10-CM | POA: Diagnosis not present

## 2016-02-10 DIAGNOSIS — M170 Bilateral primary osteoarthritis of knee: Secondary | ICD-10-CM

## 2016-02-10 DIAGNOSIS — M1710 Unilateral primary osteoarthritis, unspecified knee: Secondary | ICD-10-CM

## 2016-02-10 DIAGNOSIS — Z7901 Long term (current) use of anticoagulants: Secondary | ICD-10-CM | POA: Diagnosis not present

## 2016-02-10 DIAGNOSIS — Z954 Presence of other heart-valve replacement: Secondary | ICD-10-CM

## 2016-02-10 DIAGNOSIS — Z952 Presence of prosthetic heart valve: Secondary | ICD-10-CM

## 2016-02-10 LAB — POCT INR: INR: 2.7

## 2016-02-10 MED ORDER — SODIUM HYALURONATE (VISCOSUP) 25 MG/2.5ML IX SOSY: 1.0000 | PREFILLED_SYRINGE | INTRA_ARTICULAR | Status: DC

## 2016-02-10 NOTE — Telephone Encounter (Signed)
Todd Parsons, he wouldl ike to have repeat series of Supartz.  He has [employer] and will require prior authorization before he sees Dr. Darene Lamer again.

## 2016-02-10 NOTE — Telephone Encounter (Signed)
Medication pended and routed to Dr. Dianah Field for sign off.

## 2016-02-10 NOTE — Progress Notes (Signed)
   Subjective:    Patient ID: Todd Parsons, male    DOB: 1946-08-30, 70 y.o.   MRN: [id number]  HPI Subjective:    CC: HTN  HPI:  Hypertension- Pt denies chest pain, SOB, dizziness, or heart palpitations.  Taking meds as directed w/o problems.  Denies medication side effects.    History of aortic valve replacement-he follows up with cardiology at [facility] about 2 weeks. He is hoping to find out long-term about the Coumadin and whether or not he will need to be on it for life or not. We are currently managing his anticoagulation.  Bilateral knee osteoarthritis-he would like to have another round of the Supartz injections. He had these done about 3-1/2 years ago. He's been having some pain on and off and plans on actually starting to do cycling for exercise since getting on the treadmill really aggravates his knees.  Past medical history, Surgical history, Family history not pertinant except as noted below, Social history, Allergies, and medications have been entered into the medical record, reviewed, and corrections made.   Review of Systems: No fevers, chills, night sweats, weight loss, chest pain, or shortness of breath.   Objective:    General: Well Developed, well nourished, and in no acute distress.  Neuro: Alert and oriented x3, extra-ocular muscles intact, sensation grossly intact.  HEENT: Normocephalic, atraumatic  Skin: Warm and dry, no rashes. Cardiac: Regular rate and rhythm, no murmurs rubs or gallops, no lower extremity edema.  Respiratory: Clear to auscultation bilaterally. Not using accessory muscles, speaking in full sentences.   Impression and Recommendations:    Hypertension-well-controlled. Continue current regimen. Pulse was a little bit low today but felt regular on exam today. Next  History of aortic valve replacement-continue Coumadin. If some of the newer anticoagulant agents get approval for valve replacement at some point he maybe change. Certainly this  up to the [facility] providers. We will continue to follow and adjust his Coumadin as needed.  Bilateral knee osteoarthritis-he would like to have another round of the Supartz injections. He had these done about 3-1/2 years ago. He's been having some pain on and off and plans on actually starting to do cycling for exercise since getting on the treadmill really aggravates his knees. We'll go ahead and send a note for referral coordinator so that we can get prior authorization since he has [employer]. I believe they require Supartz over Orthovisc.  Anticoagulation-INR 2.7 today. Same dose. Recheck in 4 weeks.     Review of Systems     Objective:   Physical Exam

## 2016-02-10 NOTE — Progress Notes (Signed)
.  Pt here for INR check no missed doses, diet changes,bruising,bleeding,CP,SOB.Heba Ige [place]   

## 2016-02-10 NOTE — Telephone Encounter (Signed)
Pt called and lvm stating that he forgot to mention at his OV today that he had ate broccoli and will probably eat this again sometime this week and wanted to know if he should adjust his coumadin due to eating this. Will fwd to pcp for advice.Audelia Hives [place]

## 2016-02-10 NOTE — Telephone Encounter (Signed)
It's ok.  Just don't eat too much or too often.

## 2016-02-10 NOTE — Telephone Encounter (Signed)
All done

## 2016-02-12 NOTE — Telephone Encounter (Signed)
Pt informed.Todd Parsons  

## 2016-02-20 ENCOUNTER — Ambulatory Visit: Payer: Commercial Managed Care - HMO | Admitting: Family Medicine

## 2016-02-24 DIAGNOSIS — I4891 Unspecified atrial fibrillation: Secondary | ICD-10-CM | POA: Diagnosis not present

## 2016-02-24 DIAGNOSIS — I10 Essential (primary) hypertension: Secondary | ICD-10-CM | POA: Diagnosis not present

## 2016-02-24 DIAGNOSIS — I483 Typical atrial flutter: Secondary | ICD-10-CM | POA: Diagnosis not present

## 2016-02-24 DIAGNOSIS — R002 Palpitations: Secondary | ICD-10-CM | POA: Diagnosis not present

## 2016-02-24 DIAGNOSIS — Z7901 Long term (current) use of anticoagulants: Secondary | ICD-10-CM | POA: Diagnosis not present

## 2016-02-24 DIAGNOSIS — Z952 Presence of prosthetic heart valve: Secondary | ICD-10-CM | POA: Diagnosis not present

## 2016-03-12 ENCOUNTER — Ambulatory Visit (INDEPENDENT_AMBULATORY_CARE_PROVIDER_SITE_OTHER): Payer: Commercial Managed Care - HMO | Admitting: Family Medicine

## 2016-03-12 ENCOUNTER — Ambulatory Visit: Payer: Self-pay | Admitting: Family Medicine

## 2016-03-12 ENCOUNTER — Encounter: Payer: Self-pay | Admitting: Family Medicine

## 2016-03-12 VITALS — BP 120/78 | HR 45 | Wt 197.0 lb

## 2016-03-12 DIAGNOSIS — Z7901 Long term (current) use of anticoagulants: Secondary | ICD-10-CM

## 2016-03-12 DIAGNOSIS — L723 Sebaceous cyst: Secondary | ICD-10-CM

## 2016-03-12 DIAGNOSIS — L989 Disorder of the skin and subcutaneous tissue, unspecified: Secondary | ICD-10-CM | POA: Diagnosis not present

## 2016-03-12 DIAGNOSIS — R21 Rash and other nonspecific skin eruption: Secondary | ICD-10-CM | POA: Diagnosis not present

## 2016-03-12 LAB — POCT INR: INR: 2.2

## 2016-03-12 MED ORDER — WARFARIN SODIUM 5 MG PO TABS: 5.0000 mg | ORAL_TABLET | Freq: Every day | ORAL | Status: DC

## 2016-03-12 NOTE — Progress Notes (Signed)
Subjective:    CC: Rash   HPI: Patient comes in today also complaining about a rash on his shins bilaterally. He says it's been there for a couple of weeks. He denies any itching or irritation of the skin. He denies any new soaps lotions or perfumes. She feels like it's getting a little bit better.  Aortic valve replacement-due for Coumadin check today. I really  The has a small bump on the tip of the middle finger on the right hand that he would like me to look at today. He says it occasionally bothers him. No bleeding etc. Her ports having had a hygienic granuloma removed from the webspace on that same hand.  He also has a sebaceous cyst on his right mid back that he would like removed as well. As well as a couple of benign moles.  Past medical history, Surgical history, Family history not pertinant except as noted below, Social history, Allergies, and medications have been entered into the medical record, reviewed, and corrections made.   Review of Systems: No fevers, chills, night sweats, weight loss, chest pain, or shortness of breath.   Objective:    General: Well Developed, well nourished, and in no acute distress.  Neuro: Alert and oriented x3, extra-ocular muscles intact, sensation grossly intact.  HEENT: Normocephalic, atraumatic  Skin: Warm and dry,.  He has an almost petechial type rash over both shins. Though it does not blanch with pressure. No open wounds or drainage. No scale. No bruising or swelling.  Cardiac: Regular rate and rhythm, no murmurs rubs or gallops, no lower extremity edema.  Respiratory: Clear to auscultation bilaterally. Not using accessory muscles, speaking in full sentences. Ext:  No LE edema.    Impression and Recommendations:   Rash-unclear etiology. It doesn't look consistent with a contact dermatitis. Next  Skin lesion-most consistent with a possible small wart. Will continue to keep an eye on it. It's getting larger then consider biopsy for  further evaluation. It doesn't quite have the feature of a squamous cell or basal cell.  Aortic valve replacement-see adjustment for Coumadin today. His INR slightly subtherapeutic.   Will refer to dermatology for sebaceous cyst excision and mole removal. `

## 2016-03-12 NOTE — Progress Notes (Signed)
Pt here for INR check no missed doses, diet changes,bruising,bleeding,CP,SOB, pt taking 2.5 mg mon-sat, 5 mg sun.  INR today 2.2.Todd Parsons

## 2016-03-25 DIAGNOSIS — D171 Benign lipomatous neoplasm of skin and subcutaneous tissue of trunk: Secondary | ICD-10-CM | POA: Diagnosis not present

## 2016-03-25 DIAGNOSIS — D225 Melanocytic nevi of trunk: Secondary | ICD-10-CM | POA: Diagnosis not present

## 2016-03-25 DIAGNOSIS — D485 Neoplasm of uncertain behavior of skin: Secondary | ICD-10-CM | POA: Diagnosis not present

## 2016-03-25 DIAGNOSIS — D492 Neoplasm of unspecified behavior of bone, soft tissue, and skin: Secondary | ICD-10-CM | POA: Diagnosis not present

## 2016-03-25 DIAGNOSIS — L579 Skin changes due to chronic exposure to nonionizing radiation, unspecified: Secondary | ICD-10-CM | POA: Diagnosis not present

## 2016-03-25 DIAGNOSIS — L814 Other melanin hyperpigmentation: Secondary | ICD-10-CM | POA: Diagnosis not present

## 2016-03-25 DIAGNOSIS — D234 Other benign neoplasm of skin of scalp and neck: Secondary | ICD-10-CM | POA: Diagnosis not present

## 2016-03-25 DIAGNOSIS — D1801 Hemangioma of skin and subcutaneous tissue: Secondary | ICD-10-CM | POA: Diagnosis not present

## 2016-04-02 ENCOUNTER — Ambulatory Visit: Payer: Commercial Managed Care - HMO

## 2016-04-02 ENCOUNTER — Encounter: Payer: Self-pay | Admitting: Family Medicine

## 2016-04-02 ENCOUNTER — Ambulatory Visit (INDEPENDENT_AMBULATORY_CARE_PROVIDER_SITE_OTHER): Payer: Commercial Managed Care - HMO | Admitting: Family Medicine

## 2016-04-02 VITALS — BP 116/74 | HR 67 | Wt 198.0 lb

## 2016-04-02 DIAGNOSIS — L723 Sebaceous cyst: Secondary | ICD-10-CM | POA: Diagnosis not present

## 2016-04-02 DIAGNOSIS — M79644 Pain in right finger(s): Secondary | ICD-10-CM | POA: Diagnosis not present

## 2016-04-02 DIAGNOSIS — N3281 Overactive bladder: Secondary | ICD-10-CM | POA: Diagnosis not present

## 2016-04-02 DIAGNOSIS — Z7901 Long term (current) use of anticoagulants: Secondary | ICD-10-CM

## 2016-04-02 DIAGNOSIS — Z952 Presence of prosthetic heart valve: Secondary | ICD-10-CM

## 2016-04-02 DIAGNOSIS — Z954 Presence of other heart-valve replacement: Secondary | ICD-10-CM

## 2016-04-02 DIAGNOSIS — M25562 Pain in left knee: Secondary | ICD-10-CM

## 2016-04-02 DIAGNOSIS — R454 Irritability and anger: Secondary | ICD-10-CM

## 2016-04-02 DIAGNOSIS — M25561 Pain in right knee: Secondary | ICD-10-CM

## 2016-04-02 LAB — POCT INR: INR: 2.2

## 2016-04-02 MED ORDER — OXYBUTYNIN CHLORIDE 5 MG PO TABS: 5.0000 mg | ORAL_TABLET | Freq: Two times a day (BID) | ORAL | 1 refills | Status: DC

## 2016-04-02 NOTE — Progress Notes (Signed)
Subjective:    CC:   HPI: Patient came in today wanting me to check a lesion on his left arm. He said it x-ray showed up about a day or 2 after he saw his rheumatologist. He says it actually looks better today than it did yesterday. Yesterday was very bright red and irritated. He denies any irritation or itching. He just noticed it on his left forearm a few days ago. He doesn't remember any injury or trauma. He also needs a referral for sebaceous cyst at [facility]. He did see the dermatologist about it and they recommended internal surgery and do the removal. So he will need a new referral.  Overactive bladder-he did find out that oxybutynin should be well covered on his insurance and would like a new perception sent to his mail order pharmacy, [place]. Next  Bilateral knee pain-he says overall he is able to do most activities that he would like to do. He can walk without difficulty. He really notices the pain more so when he is going upstairs or specially if he is carrying weighted objects upstairs. Or if he tries to slightly jog or hurry he'll notice pain in the and knees. He did have an injection done years ago. He did have x-rays done of both knees back in 2013, approximately 4 years ago and was noted to have some mild tricompartmental degenerative joint disease. He says he will usually use Tylenol or Aleve if he does experience pain and that does seem to help. He says he really just wanted to discuss it today because he wanted to make sure there wasn't something he should be doing differently now that could affect him down the road.  He is also having some pain in his right thumb. He notices it more when he is using his remote control at home. No recent injury. No overuse injury. Swelling or redness over the joint.   history of aortic valve replacement-he is currently on Coumadin. His INR today was 2.2. Reaction did increase his dose about 2 weeks ago but it's stable.  His wife is here  with him today as well and she mentions that he has been very irritable over the last several months. He denies any feelings of feeling down depressed or hopeless or particularly anxious. He just gets very easily frustrated with things. It can be very simple such as the Knot being on the toothpaste correctly or can be recent political events on the news.  Past medical history, Surgical history, Family history not pertinant except as noted below, Social history, Allergies, and medications have been entered into the medical record, reviewed, and corrections made.   Review of Systems: No fevers, chills, night sweats, weight loss, chest pain, or shortness of breath.   Objective:    General: Well Developed, well nourished, and in no acute distress.  Neuro: Alert and oriented x3, extra-ocular muscles intact, sensation grossly intact.  HEENT: Normocephalic, atraumatic  Skin: Warm and dry, no rashes.Is a small lesion on the left forearm. It actually looks like a broken blood flow so with a just a fine scaly papule in the center approximately 2 mm in size.  Cardiac: Regular rate and rhythm, no murmurs rubs or gallops, no lower extremity edema.  Respiratory: Clear to auscultation bilaterally. Not using accessory muscles, speaking in full sentences.   Impression and Recommendations:   Skin lesion-most likely an actinic keratosis they may also just be some irritation. He actually feels like he Artie looks much  better today so we'll just monitor over the next 2 weeks if not resolving then recommend treatment with cryotherapy or possible shave biopsy.  Overactive bladder-Wilson oxybutynin to his mail order pharmacy since it should be covered. Next  Sebaceous cyst-we'll place referral for [facility] surgical.  Bilateral knee pain-recommend conservative therapy with exercises and stretches. Certainly if it persists or pain becomes more bothersome then he may want to consult with one of our sports medicine  providers for further intervention.  Right thumb pain--most likely some arthritis or tendon overuse. Recommend rest and avoiding heavy gripping and twisting at the same time. If not appearing in the next couple weeks and please let me know.  History of aortic valve replacement-C antegrade relation flowsheet for adjustments to Coumadin level.  Irritability-PHQ 9 score negative for depression. Discussed options including referral to therapist/counselor versus possible medication. I like to see him back in a couple weeks to discuss further.

## 2016-04-05 ENCOUNTER — Other Ambulatory Visit: Payer: Self-pay | Admitting: Family Medicine

## 2016-04-05 DIAGNOSIS — Z79899 Other long term (current) drug therapy: Secondary | ICD-10-CM | POA: Diagnosis not present

## 2016-04-05 DIAGNOSIS — Z1329 Encounter for screening for other suspected endocrine disorder: Secondary | ICD-10-CM | POA: Diagnosis not present

## 2016-04-05 LAB — LIPID PANEL
CHOLESTEROL: 130 mg/dL (ref 125–200)
HDL: 34 mg/dL — ABNORMAL LOW (ref >=40)
LDL CALC: 67 mg/dL (ref <130)
Total CHOL/HDL Ratio: 3.8 Ratio (ref <=5.0)
Triglycerides: 144 mg/dL (ref <150)
VLDL: 29 mg/dL (ref <30)

## 2016-04-05 LAB — TSH: TSH: 3.31 mIU/L (ref 0.40–4.50)

## 2016-04-05 NOTE — Progress Notes (Signed)
Pt request to have TSH checked as well. Order placed under Dx: Thyroid disorder screen.

## 2016-04-07 DIAGNOSIS — R222 Localized swelling, mass and lump, trunk: Secondary | ICD-10-CM | POA: Diagnosis not present

## 2016-04-13 DIAGNOSIS — H5210 Myopia, unspecified eye: Secondary | ICD-10-CM | POA: Diagnosis not present

## 2016-04-13 DIAGNOSIS — H524 Presbyopia: Secondary | ICD-10-CM | POA: Diagnosis not present

## 2016-04-13 DIAGNOSIS — H43813 Vitreous degeneration, bilateral: Secondary | ICD-10-CM | POA: Diagnosis not present

## 2016-04-14 ENCOUNTER — Encounter: Payer: Self-pay | Admitting: Family Medicine

## 2016-04-14 ENCOUNTER — Ambulatory Visit (INDEPENDENT_AMBULATORY_CARE_PROVIDER_SITE_OTHER): Payer: Commercial Managed Care - HMO | Admitting: Family Medicine

## 2016-04-14 VITALS — BP 120/64 | HR 56 | Resp 11 | Wt 200.0 lb

## 2016-04-14 DIAGNOSIS — R454 Irritability and anger: Secondary | ICD-10-CM

## 2016-04-14 NOTE — Progress Notes (Signed)
Subjective:    CC: Irritability   HPI:  Patient was here couple weeks ago for a couple of other concerns. His wife was with him and mentioned that he is actually been very irritable over the last several months. He would get easily angered and verbally express those emotions. He denied feeling down depressed or hopeless. He denied feeling overly anxious. Did have them completed PHQ 9 and it was negative for depression.  I asked him to come back today so that we can have a more in-depth conversation about what might be triggering his irritability. He says he does have some occasional OCD tendencies. For example he is very particular about where he places things and using it the last the toothpaste etc. He doesn't feel like it's excessive or interferes with his daily life though. He said she's always been that way. He says he is always very direct and matter of fact as well. He does feel like he's been less restrained in his comments though. He denies any recent memory issues or concerns. He feels like he is resting well most nights. On average she gets up to urinate twice per night. He denies any depressive symptoms. He did note that years ago when he was on Tenormin for blood pressure it actually seemed to relax him and she'll him out. He said even his wife commented that he actually seemed to be better on it. He felt like it helped when he was on the propranolol as well but more recently after cardiac surgery he was switched to metoprolol because of better mortality and morbidity data. He is currently on 50 mg metoprolol twice a day.  Past medical history, Surgical history, Family history not pertinant except as noted below, Social history, Allergies, and medications have been entered into the medical record, reviewed, and corrections made.   Review of Systems: No fevers, chills, night sweats, weight loss, chest pain, or shortness of breath.   Objective:    General: Well Developed, well nourished, and  in no acute distress.  Neuro: Alert and oriented x3, extra-ocular muscles intact, sensation grossly intact.  HEENT: Normocephalic, atraumatic  Skin: Warm and dry, no rashes. Cardiac: Regular rate and rhythm, no murmurs rubs or gallops, no lower extremity edema.  Respiratory: Clear to auscultation bilaterally. Not using accessory muscles, speaking in full sentences.   Impression and Recommendations:   Irritability - Discussed several options including therapy or counseling which she declined. We also discussed lowering by different personality types and having a better understanding of how others approach things and working on slowing his reactions to things down a long enough to think through what he is going to say and whether or not he needs to say anything. We also discussed possibly a trial of increasing his beta blocker as he "would really like to get on something to help him ". His blood pressure looks great today so he would actually have to monitor for hypotension and dizziness and other symptoms. But we could certainly try it. We get our lysis side with his cardiologist if we might be able to discontinue the amlodipine or not.  He also wanted to let me know that he did see the surgeon for the sebaceous cyst removal. A did not want to remove it at this time because he is on Coumadin but he says he may call them back as he would really prefer to have it removed.  Time spent 30 min, > 50% spent counseling about mood and irritability.

## 2016-04-14 NOTE — Patient Instructions (Signed)
Increase metoprolol to 2 tabs by mouth twice a day. Can try for a week. Check BP at home once a day and let me know in a week how you are feeling.

## 2016-04-15 ENCOUNTER — Ambulatory Visit: Payer: Commercial Managed Care - HMO | Admitting: Family Medicine

## 2016-05-05 ENCOUNTER — Telehealth: Payer: Self-pay | Admitting: *Deleted

## 2016-05-05 NOTE — Telephone Encounter (Signed)
Pt called and stated that he has increased the metoprolol and it has caused him to be sleepy   Pt wanted to know if there is something else that he can try.  He stated that he was seen by general surgery and they had him to reschedule due to him being on coumadin. He stated that he has since d/c'd and will call their office back to reschedule I told him if he should need a letter to let us know and we will write this for him . Marland KitchenAudelia Hives [place]

## 2016-05-10 NOTE — Telephone Encounter (Signed)
If on 1 tab BID then can try cutting in half. If on 2 tabs BID then can try going back down to one tab BID.  IF he is holding his coumadin then we may have to put him on lovenox shot while off coumadin. With his valve replacement he really needs to be on a blood thinner.  We can call the gen surgery office and find out if they will do the procedure while on lovenox shots.

## 2016-05-11 NOTE — Telephone Encounter (Signed)
Pt informed about recommendations. He stated that he had been taking the metoprolol 1 tab BID. I told him to cut that in half.  Pt advised about not being on coumadin and starting the lovenox injections he stated that if we can call the surgeons office to get him scheduled they should be able to get him back on schedule this week and he would like to do this and then get back on his regular schedule for his coumadin. Maryruth Eve, Kenney Houseman [place]  Pt advised of risks. Will call the surgeons office [facility]: [phone] to schedule an appt.Audelia Hives [place]

## 2016-05-12 NOTE — Telephone Encounter (Signed)
Called [facility] and spoke with Estill Bamberg, she transferred me to [facility] /dr.[email or web address]'s asst. I had to leave a vm informing her that pt has been off of coumadin since last week and will need to get scheduled for this procedure ASAP. I advised to call the pt or our office to do this.Todd Parsons

## 2016-05-18 ENCOUNTER — Encounter: Payer: Self-pay | Admitting: Family Medicine

## 2016-05-20 ENCOUNTER — Other Ambulatory Visit: Payer: Self-pay | Admitting: Family Medicine

## 2016-05-21 DIAGNOSIS — L720 Epidermal cyst: Secondary | ICD-10-CM | POA: Diagnosis not present

## 2016-05-21 DIAGNOSIS — R222 Localized swelling, mass and lump, trunk: Secondary | ICD-10-CM | POA: Diagnosis not present

## 2016-06-02 ENCOUNTER — Other Ambulatory Visit: Payer: Self-pay | Admitting: Family Medicine

## 2016-06-02 ENCOUNTER — Ambulatory Visit (INDEPENDENT_AMBULATORY_CARE_PROVIDER_SITE_OTHER): Payer: Commercial Managed Care - HMO | Admitting: Family Medicine

## 2016-06-02 ENCOUNTER — Ambulatory Visit: Payer: Self-pay | Admitting: Family Medicine

## 2016-06-02 ENCOUNTER — Encounter: Payer: Self-pay | Admitting: Family Medicine

## 2016-06-02 VITALS — BP 97/62 | HR 58 | Wt 199.0 lb

## 2016-06-02 DIAGNOSIS — Z23 Encounter for immunization: Secondary | ICD-10-CM

## 2016-06-02 DIAGNOSIS — Z7901 Long term (current) use of anticoagulants: Secondary | ICD-10-CM | POA: Diagnosis not present

## 2016-06-02 DIAGNOSIS — R7301 Impaired fasting glucose: Secondary | ICD-10-CM | POA: Diagnosis not present

## 2016-06-02 DIAGNOSIS — Z954 Presence of other heart-valve replacement: Secondary | ICD-10-CM

## 2016-06-02 DIAGNOSIS — Z952 Presence of prosthetic heart valve: Secondary | ICD-10-CM

## 2016-06-02 DIAGNOSIS — H6123 Impacted cerumen, bilateral: Secondary | ICD-10-CM

## 2016-06-02 DIAGNOSIS — R454 Irritability and anger: Secondary | ICD-10-CM | POA: Diagnosis not present

## 2016-06-02 DIAGNOSIS — Z974 Presence of external hearing-aid: Secondary | ICD-10-CM

## 2016-06-02 LAB — POCT INR: INR: 2.2

## 2016-06-02 LAB — POCT GLYCOSYLATED HEMOGLOBIN (HGB A1C): Hemoglobin A1C: 5.9

## 2016-06-02 MED ORDER — FLUOXETINE HCL 10 MG PO CAPS
10.0000 mg | ORAL_CAPSULE | Freq: Every day | ORAL | 1 refills | Status: DC
Start: 2016-06-02 — End: 2016-07-26

## 2016-06-02 MED ORDER — FLUTICASONE PROPIONATE 50 MCG/ACT NA SUSP: 2.0000 | Freq: Every day | NASAL | 3 refills | Status: DC

## 2016-06-02 NOTE — Progress Notes (Signed)
Subjective:    CC:   HPI:  Cerumen impaction-he went to his audiologist and they recommended that he have his ears irrigated.  He did have a sternal neurologic surgery and has restarted his Coumadin about a week ago. He is on long-term anticoagulation because of history of aortic valve replacement.  IFG  - last A1c in April was 6.3. No increased thirst or urination.  Irritability - he is not feeling like the metoprolol is helping his irritability.   Past medical history, Surgical history, Family history not pertinant except as noted below, Social history, Allergies, and medications have been entered into the medical record, reviewed, and corrections made.   Review of Systems: No fevers, chills, night sweats, weight loss, chest pain, or shortness of breath.   Objective:    General: Well Developed, well nourished, and in no acute distress.  Neuro: Alert and oriented x3, extra-ocular muscles intact, sensation grossly intact.  HEENT: Normocephalic, atraumatic, bilat cerumen impaction. Unable to visualize the TMs.    Skin: Warm and dry, no rashes. Cardiac: Regular rate and rhythm, no murmurs rubs or gallops, no lower extremity edema.  Respiratory: Clear to auscultation bilaterally. Not using accessory muscles, speaking in full sentences.   Impression and Recommendations:   Cerumen impaction-irrigation performed today. Patient tolerated well. Weight until later today to wear hearing aids.  Aortic valve replacement-back on Coumadin. INR is therapeutic.Recheck INR in 2 weeks. That's  IFG - A1c much improved today. We'll continue to monitor and recheck in 6 months. Lab Results  Component Value Date   HGBA1C 5.9 06/02/2016    Irritability - will start fluoxetine.  F/U in 6 weeks.   Flu shot given today.

## 2016-07-26 ENCOUNTER — Ambulatory Visit (INDEPENDENT_AMBULATORY_CARE_PROVIDER_SITE_OTHER): Payer: Commercial Managed Care - HMO | Admitting: Family Medicine

## 2016-07-26 ENCOUNTER — Encounter: Payer: Self-pay | Admitting: Family Medicine

## 2016-07-26 VITALS — BP 116/65 | HR 51 | Ht 67.0 in | Wt 199.0 lb

## 2016-07-26 DIAGNOSIS — Z952 Presence of prosthetic heart valve: Secondary | ICD-10-CM | POA: Diagnosis not present

## 2016-07-26 DIAGNOSIS — R454 Irritability and anger: Secondary | ICD-10-CM

## 2016-07-26 DIAGNOSIS — Z7901 Long term (current) use of anticoagulants: Secondary | ICD-10-CM

## 2016-07-26 DIAGNOSIS — M170 Bilateral primary osteoarthritis of knee: Secondary | ICD-10-CM | POA: Diagnosis not present

## 2016-07-26 LAB — POCT INR: INR: 2.3

## 2016-07-26 MED ORDER — FLUOXETINE HCL 20 MG PO CAPS: 20.0000 mg | ORAL_CAPSULE | Freq: Every day | ORAL | 1 refills | Status: DC

## 2016-07-26 MED ORDER — DICLOFENAC SODIUM 1 % TD GEL: 2.0000 g | Freq: Four times a day (QID) | TRANSDERMAL | 99 refills | Status: DC

## 2016-07-26 NOTE — Progress Notes (Signed)
   Subjective:    Patient ID: Todd Parsons, male    DOB: 1945-12-31, 70 y.o.   MRN: [id number]  HPI Patient is here today for 6 week follow-up for irritability. He initially wanted to try a beta blocker but this did not improve his symptoms. Thus we decided on fluoxetine.  He feels like it is working well. He'll has a few days where he feels a little down but otherwise is doing well on the medication he has not had any side effects.  He feels more "balanced".   Aortic valve replacement/medication monitoring-due for INR check today since he is on Coumadin.taking half tab 5 days a week and whole tab 2 days a week. No bleeding.   He still having a lot of pain and problems with his right knee. He's had viscous supplementation before and it was helpful but when he checked into with his new insurance is going to be quite costly and incus are his Medicare part D. He used to take meloxicam before his heart surgery and before starting Coumadin and says it worked really well to control his pain. Now he mostly uses Tylenol and occasionally tramadol. The Tylenol just doesn't seem as effective and he doesn't want to overuse the tramadol.   Review of Systems     Objective:   Physical Exam  Constitutional: He is oriented to person, place, and time. He appears well-developed and well-nourished.  HENT:  Head: Normocephalic and atraumatic.  Cardiovascular: Normal rate, regular rhythm and normal heart sounds.   Pulmonary/Chest: Effort normal and breath sounds normal.  Neurological: He is alert and oriented to person, place, and time.  Skin: Skin is warm and dry.  Psychiatric: He has a normal mood and affect. His behavior is normal.       Assessment & Plan:  Irritability- PHQ= 9 score of 1.  He still feels like there is room for improvement.  We discussed options. Will increase this to 20 mg. Follow-up in 3 months.  Valve replacement/anticoagulation-See anticoagulation flowsheet for adjustments.  Right  knee pain-still reminded him he cannot use NSAIDs because he is on Coumadin. Can use Tylenol. We can try Voltaren gel which has less systemic effect. And still uses tramadol as needed. At some point he could consider repeating the viscous supplementation but sounds a gets can be much more costly with his new insurance.

## 2016-07-28 ENCOUNTER — Encounter: Payer: Self-pay | Admitting: Family Medicine

## 2016-08-02 ENCOUNTER — Telehealth: Payer: Self-pay

## 2016-08-02 ENCOUNTER — Ambulatory Visit: Payer: Commercial Managed Care - HMO | Admitting: Family Medicine

## 2016-08-02 NOTE — Telephone Encounter (Signed)
Yes, ok to send 7 days supply

## 2016-08-02 NOTE — Telephone Encounter (Signed)
Pt reports that he has yet to received his Prozac from [facility]. He called the pharmacy and they notified pt that it will be another 5-7 days before he will receive meds in the mail. He is requesting a week supply to be sent to [facility]. Please advise.

## 2016-08-02 NOTE — Telephone Encounter (Signed)
Pharmacy called and verbal order given over the phone for Prozac 20 mg, 1 QD for 7 days quantity 7.

## 2016-08-11 ENCOUNTER — Telehealth: Payer: Self-pay | Admitting: Sports Medicine

## 2016-08-11 ENCOUNTER — Encounter: Payer: Self-pay | Admitting: Family Medicine

## 2016-08-11 ENCOUNTER — Ambulatory Visit (INDEPENDENT_AMBULATORY_CARE_PROVIDER_SITE_OTHER): Payer: Commercial Managed Care - HMO | Admitting: Family Medicine

## 2016-08-11 ENCOUNTER — Ambulatory Visit: Payer: Self-pay | Admitting: Family Medicine

## 2016-08-11 VITALS — BP 115/70 | HR 50 | Ht 67.0 in | Wt 194.0 lb

## 2016-08-11 DIAGNOSIS — I10 Essential (primary) hypertension: Secondary | ICD-10-CM

## 2016-08-11 DIAGNOSIS — M170 Bilateral primary osteoarthritis of knee: Secondary | ICD-10-CM | POA: Diagnosis not present

## 2016-08-11 DIAGNOSIS — Z7901 Long term (current) use of anticoagulants: Secondary | ICD-10-CM

## 2016-08-11 DIAGNOSIS — Z952 Presence of prosthetic heart valve: Secondary | ICD-10-CM

## 2016-08-11 DIAGNOSIS — M25561 Pain in right knee: Secondary | ICD-10-CM | POA: Diagnosis not present

## 2016-08-11 DIAGNOSIS — G8929 Other chronic pain: Secondary | ICD-10-CM

## 2016-08-11 LAB — POCT INR: INR: 2.5

## 2016-08-11 NOTE — Telephone Encounter (Signed)
-----   Message from Silverio Decamp, MD sent at 08/11/2016 10:49 AM EST ----- orthovisc approval please right knee only ___________________________________________ Gwen Her. Dianah Field, M.D., ABFM., CAQSM. Primary Care and [place] Instructor of [place] [facility] [employer]

## 2016-08-11 NOTE — Progress Notes (Signed)
Subjective:    CC: HTN   HPI:  Hypertension- Pt denies chest pain, SOB, dizziness, or heart palpitations.  Taking meds as directed w/o problems.  Denies medication side effects.    Right knee pain - Has been getting worse in the last several months.  Has viscose supplementation about 4 years ago.  Says getting worse.  Checked with insurance and says  It will cost too much to do it again. Pain is worse when he sits for an hour and then tries to get up . Will hobble.  Says the Tylenol helps some.  volteran gel wasn't covered by insurance.    Hx of aortic valve replacement - doing well on coumadin. No concerns. Some easy bruising.    Past medical history, Surgical history, Family history not pertinant except as noted below, Social history, Allergies, and medications have been entered into the medical record, reviewed, and corrections made.   Review of Systems: No fevers, chills, night sweats, weight loss, chest pain, or shortness of breath.   Objective:    General: Well Developed, well nourished, and in no acute distress.  Neuro: Alert and oriented x3, extra-ocular muscles intact, sensation grossly intact.  HEENT: Normocephalic, atraumatic  Skin: Warm and dry, no rashes. Cardiac: Regular rate and rhythm, no murmurs rubs or gallops, no lower extremity edema.  Respiratory: Clear to auscultation bilaterally. Not using accessory muscles, speaking in full sentences.   Impression and Recommendations:    HTN  - Well controlled. Continue current regimen. Follow up in  6 mo.   Right knee pain - would benefit for steroid injection. Will get in with sports med today.  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     S/P aortic valve replacement - see anticoag flowsheet for adjustement to coumadin.  Repeat 3 weeks.   Right knee pain -

## 2016-08-11 NOTE — Telephone Encounter (Signed)
Submitted for approval on Orthovisc. Awaiting confirmation.  

## 2016-08-11 NOTE — Assessment & Plan Note (Addendum)
Repeat right steroid injection today, left knee is doing okay. He will also do arthritis strength Tylenol  I would also like to get him approved for Visco supplementation again for just the right knee.

## 2016-08-11 NOTE — Progress Notes (Signed)
   Subjective:    I'm seeing this patient as a consultation for:   Dr. Beatrice Lecher  CC: Right knee pain  HPI: This is a pleasant 70 year old male, I haven't seen him for over 3 years. He has known knee osteoarthritis that responded for 3 years after a bilateral Supartz series. More recently he's had a recurrence of pain under the kneecap, worse with going up and down stairs. Night time swelling as well as gelling in the morning. Moderate, persistent, no mechanical symptoms, desires interventional treatment today.  Past medical history:  Negative.  See flowsheet/record as well for more information.  Surgical history: Negative.  See flowsheet/record as well for more information.  Family history: Negative.  See flowsheet/record as well for more information.  Social history: Negative.  See flowsheet/record as well for more information.  Allergies, and medications have been entered into the medical record, reviewed, and no changes needed.   Review of Systems: No headache, visual changes, nausea, vomiting, diarrhea, constipation, dizziness, abdominal pain, skin rash, fevers, chills, night sweats, weight loss, swollen lymph nodes, body aches, joint swelling, muscle aches, chest pain, shortness of breath, mood changes, visual or auditory hallucinations.   Objective:   General: Well Developed, well nourished, and in no acute distress.  Neuro/Psych: Alert and oriented x3, extra-ocular muscles intact, able to move all 4 extremities, sensation grossly intact. Skin: Warm and dry, no rashes noted.  Respiratory: Not using accessory muscles, speaking in full sentences, trachea midline.  Cardiovascular: Pulses palpable, no extremity edema. Abdomen: Does not appear distended. Right Knee: Mild effusion with patellar facet tenderness ROM normal in flexion and extension and lower leg rotation. Ligaments with solid consistent endpoints including ACL, PCL, LCL, MCL. Negative Mcmurray's and provocative  meniscal tests. Non painful patellar compression. Patellar and quadriceps tendons unremarkable. Hamstring and quadriceps strength is normal.  Procedure: Real-time Ultrasound Guided aspiration/injection of right knee Device: GE Logiq E  Verbal informed consent obtained.  Time-out conducted.  Noted no overlying erythema, induration, or other signs of local infection.  Skin prepped in a sterile fashion.  Local anesthesia: Topical Ethyl chloride.  With sterile technique and under real time ultrasound guidance:  Aspirated 5 mL straw-colored fluid, syringe switched and 1 mL kenalog 40, 2 mL lidocaine, 2 mL Marcaine injected easily Completed without difficulty  Pain immediately resolved suggesting accurate placement of the medication.  Advised to call if fevers/chills, erythema, induration, drainage, or persistent bleeding.  Images permanently stored and available for review in the ultrasound unit.  Impression: Technically successful ultrasound guided injection.  Impression and Recommendations:   This case required medical decision making of moderate complexity.  Primary osteoarthritis of both knees Repeat right steroid injection today, left knee is doing okay. He will also do arthritis strength Tylenol  I would also like to get him approved for Visco supplementation again for just the right knee.

## 2016-08-13 NOTE — Telephone Encounter (Signed)
Received the following information from [place] benefits investigation:   Patient has a Medicare Replacement HMO Plan with an effective date of 09/06/2012. Plan renews on 09/06/16. Benefits for dates of service after 09/06/16 may vary. We recommend you resubmit for dates of service after 09/06/16. Orthovisc is no longer considered preferred products with [employer] as of 09-06-14. To obtain approval for Orthovisc we recommend submitting a pre-cert along with a letter of medical necessity to [facility] by calling [phone] or faxing to [phone]. Submit PCP referral for initial visit with claim. [id number] is covered at 80% & [id number] is covered at 100% of the contracted rate when performed in an office setting. $45.00 copay for specialist office visit applies. If out of pocket is met, coverage goes to 100% & copay will no longer apply. Reference: [id number]   Pt advised of information. Will resubmit after the new year.

## 2016-09-04 ENCOUNTER — Other Ambulatory Visit: Payer: Self-pay | Admitting: Family Medicine

## 2016-09-07 ENCOUNTER — Encounter: Payer: Self-pay | Admitting: Family Medicine

## 2016-09-07 ENCOUNTER — Ambulatory Visit (INDEPENDENT_AMBULATORY_CARE_PROVIDER_SITE_OTHER): Payer: Commercial Managed Care - HMO

## 2016-09-07 ENCOUNTER — Ambulatory Visit (INDEPENDENT_AMBULATORY_CARE_PROVIDER_SITE_OTHER): Payer: Commercial Managed Care - HMO | Admitting: Family Medicine

## 2016-09-07 VITALS — BP 105/70 | HR 56 | Ht 67.0 in | Wt 197.0 lb

## 2016-09-07 DIAGNOSIS — R05 Cough: Secondary | ICD-10-CM

## 2016-09-07 DIAGNOSIS — I517 Cardiomegaly: Secondary | ICD-10-CM

## 2016-09-07 DIAGNOSIS — Z7901 Long term (current) use of anticoagulants: Secondary | ICD-10-CM

## 2016-09-07 DIAGNOSIS — R059 Cough, unspecified: Secondary | ICD-10-CM

## 2016-09-07 DIAGNOSIS — Z952 Presence of prosthetic heart valve: Secondary | ICD-10-CM | POA: Diagnosis not present

## 2016-09-07 LAB — POCT INR: INR: 2.2

## 2016-09-07 MED ORDER — BENZONATATE 200 MG PO CAPS: 200.0000 mg | ORAL_CAPSULE | Freq: Three times a day (TID) | ORAL | 0 refills | Status: DC | PRN

## 2016-09-07 MED ORDER — PREDNISONE 20 MG PO TABS: 40.0000 mg | ORAL_TABLET | Freq: Every day | ORAL | 0 refills | Status: DC

## 2016-09-07 NOTE — Progress Notes (Signed)
Subjective:    CC: Cough  HPI: 71 year old male with a history of pulmonary fibrosis and reactive airways disease comes in complaining of cough today > 1 week. C/O of fatigue, achy, and feels cold.  Taking Alkaseltzer cold. Cough is keeping him awake at night.  Denies any SOB or GI sxs.  No fever.  No GI sxs.    Long-term use of anticoagulant-2 for INR check today. 5mg  3 days per weeka dn 2.5 all others.  INR was 2.2. No bleeding.   Past medical history, Surgical history, Family history not pertinant except as noted below, Social history, Allergies, and medications have been entered into the medical record, reviewed, and corrections made.   Review of Systems: No fevers, chills, night sweats, weight loss, chest pain, or shortness of breath.   Objective:    General: Well Developed, well nourished, and in no acute distress.  Neuro: Alert and oriented x3, extra-ocular muscles intact, sensation grossly intact.  HEENT: Normocephalic, atraumatic  Skin: Warm and dry, no rashes. Cardiac: Regular rate and rhythm.  Heart sounds are loud today with a thud.  No lower extremity edema.  Respiratory: diffuse crackles.  No wheezing. Not using accessory muscles, speaking in full sentences. Ext: No LE edema.     Impression and Recommendations:   Cough - likely viral but bc of abnormal chest sounds will get CXR.  Consider acute bronchitis vs pneumonia. No signs of pulmonary edema. 6 her was unrevealing so diagnosed with acute bronchitis. Can treat with 5 days of prednisone if desired.  H/O artificial valve/anticoag - INR just below goal.  Will adjust coumadin to 5 mg 4 days per week and 2.5 other 2 mg.  Repeat in 2 weeks.

## 2016-09-14 NOTE — Telephone Encounter (Signed)
Submitted for approval on Orthovisc. Awaiting confirmation.  

## 2016-09-16 NOTE — Telephone Encounter (Signed)
Received the following information from [place] benefits investigation:   Patient has Medicare Replacement HMO plan with an effective date of 09/06/2016. [id number] is covered at 80% & [id number] is covered at 80% of the contracted rate when performed in an office setting. $45.00 Copay for Specialist Office visit applies, If billed. If Out of Pocket is met, Coverage goes to 100%. REF# [id number]   Calculated the Pt's estimated OOP cost and left information on Pt's VM. Requested callback to go over numbers in more detail and determine if Pt would like to proceed with injections. Callback information provided.

## 2016-09-16 NOTE — Telephone Encounter (Signed)
Pt returned clinic call regarding injections. States he is going to pass on them for right now due to [facility] cost. Advised Pt if he changes his mind to contact office and let us know. verbalized understanding, no further questions.

## 2016-10-26 ENCOUNTER — Encounter: Payer: Self-pay | Admitting: Family Medicine

## 2016-10-26 ENCOUNTER — Ambulatory Visit (INDEPENDENT_AMBULATORY_CARE_PROVIDER_SITE_OTHER): Payer: Commercial Managed Care - HMO | Admitting: Family Medicine

## 2016-10-26 VITALS — BP 95/63 | HR 56 | Ht 67.0 in | Wt 196.0 lb

## 2016-10-26 DIAGNOSIS — Z7901 Long term (current) use of anticoagulants: Secondary | ICD-10-CM

## 2016-10-26 DIAGNOSIS — R454 Irritability and anger: Secondary | ICD-10-CM

## 2016-10-26 DIAGNOSIS — I10 Essential (primary) hypertension: Secondary | ICD-10-CM

## 2016-10-26 DIAGNOSIS — R7301 Impaired fasting glucose: Secondary | ICD-10-CM | POA: Diagnosis not present

## 2016-10-26 DIAGNOSIS — I483 Typical atrial flutter: Secondary | ICD-10-CM

## 2016-10-26 LAB — POCT GLYCOSYLATED HEMOGLOBIN (HGB A1C): HEMOGLOBIN A1C: 6.5

## 2016-10-26 LAB — POCT INR: INR: 3.1

## 2016-10-26 MED ORDER — METFORMIN HCL ER 500 MG PO TB24: 500.0000 mg | ORAL_TABLET | Freq: Every day | ORAL | 1 refills | Status: DC

## 2016-10-26 MED ORDER — AMBULATORY NON FORMULARY MEDICATION: 0 refills | Status: DC

## 2016-10-26 NOTE — Progress Notes (Signed)
Subjective:    CC: IFG  HPI:  IFG - No increased thirst or urination.He says over the last 2 months he's actually been very inactive. He has not been exercising and has been eating a lot more. He says he feels like hibernation mode.  Irritability - probably on fluoxetine 20 mg daily. Doing very well on the medication. He has not had any side effects and does feel a little more even keel on the medication. He feels less irritable and even the people around him noticed that he seems a little happier.  Hypertension- Pt denies chest pain, SOB, dizziness, or heart palpitations.  Taking meds as directed w/o problems.  Denies medication side effects.    Atrial flutter - on coumadin. Doing well.    He says his knee is actually doing better after Dr. Dianah Field injected it.  Past medical history, Surgical history, Family history not pertinant except as noted below, Social history, Allergies, and medications have been entered into the medical record, reviewed, and corrections made.   Review of Systems: No fevers, chills, night sweats, weight loss, chest pain, or shortness of breath.   Objective:    General: Well Developed, well nourished, and in no acute distress.  Neuro: Alert and oriented x3, extra-ocular muscles intact, sensation grossly intact.  HEENT: Normocephalic, atraumatic  Skin: Warm and dry, no rashes. Cardiac: Regular rate and rhythm, no murmurs rubs or gallops, no lower extremity edema.  Respiratory: Clear to auscultation bilaterally. Not using accessory muscles, speaking in full sentences.   Impression and Recommendations:    DM, type 2, new dx - A1c 6.5 today.Discussed need diagnosis. We'll go ahead and get her prescription for glucometer with lancets and strips. He declined to take in nutrition class at least at this point in time. He did get a fair amount literature and nutrition counseling when he went through weight loss program at [facility] and says he will pull out some  of that paperwork and take a look at it again. Also get him perception for new glucometer. Encouraged him to start exercising more regularly.  Irritability/mood-doing very well on fluoxetine. Continue current regimen. Follow-up in 6 months.  HTN - Well controlled. Continue current regimen. Follow up in  6 months. BP a little low today.    Atrial flutter - see anticoag flowsheet for adjustment. INR is too high.

## 2016-10-27 ENCOUNTER — Other Ambulatory Visit: Payer: Self-pay | Admitting: *Deleted

## 2016-10-27 MED ORDER — ACCU-CHEK AVIVA VI SOLN: 99 refills | Status: DC

## 2016-10-27 MED ORDER — BD SWAB SINGLE USE REGULAR PADS: MEDICATED_PAD | 99 refills | Status: DC

## 2016-11-05 ENCOUNTER — Other Ambulatory Visit: Payer: Self-pay | Admitting: *Deleted

## 2016-11-05 MED ORDER — ATORVASTATIN CALCIUM 40 MG PO TABS: 40.0000 mg | ORAL_TABLET | Freq: Every day | ORAL | 3 refills | Status: DC

## 2016-11-05 MED ORDER — WARFARIN SODIUM 5 MG PO TABS: 5.0000 mg | ORAL_TABLET | Freq: Every day | ORAL | 3 refills | Status: DC

## 2016-11-05 MED ORDER — AMLODIPINE BESYLATE 2.5 MG PO TABS: 2.5000 mg | ORAL_TABLET | Freq: Every day | ORAL | 3 refills | Status: DC

## 2016-11-17 ENCOUNTER — Other Ambulatory Visit: Payer: Self-pay | Admitting: *Deleted

## 2016-11-17 MED ORDER — WARFARIN SODIUM 5 MG PO TABS: 5.0000 mg | ORAL_TABLET | Freq: Every day | ORAL | 3 refills | Status: DC

## 2016-11-22 ENCOUNTER — Other Ambulatory Visit: Payer: Self-pay | Admitting: Family Medicine

## 2016-11-30 ENCOUNTER — Other Ambulatory Visit: Payer: Self-pay | Admitting: *Deleted

## 2016-11-30 MED ORDER — METFORMIN HCL ER 500 MG PO TB24: 500.0000 mg | ORAL_TABLET | Freq: Every day | ORAL | 1 refills | Status: DC

## 2016-12-01 ENCOUNTER — Other Ambulatory Visit: Payer: Self-pay | Admitting: *Deleted

## 2016-12-01 MED ORDER — METOPROLOL TARTRATE 50 MG PO TABS: 50.0000 mg | ORAL_TABLET | Freq: Two times a day (BID) | ORAL | 1 refills | Status: DC

## 2016-12-13 ENCOUNTER — Telehealth: Payer: Self-pay

## 2016-12-13 NOTE — Telephone Encounter (Signed)
[facility] pharmacy called and wanted a clarification on pt's metoprolol.  It had been ordered by Dr. Tally Due as 50 mg ER, you wrote it in March for Metoprolol 50 mg BID.  Please advise.

## 2016-12-13 NOTE — Telephone Encounter (Signed)
I would say to call pt and clarify.  ONe is probably the regular release and the other the ER. I am not sure if he has a preference.

## 2016-12-13 NOTE — Telephone Encounter (Signed)
I spoke with patient, and he would like to stay on the metoprolol 50 mg BID.  [place] and spoke with Erasmo Downer to clarify.

## 2016-12-17 ENCOUNTER — Encounter: Payer: Self-pay | Admitting: Sports Medicine

## 2016-12-17 ENCOUNTER — Telehealth: Payer: Self-pay | Admitting: Sports Medicine

## 2016-12-17 ENCOUNTER — Ambulatory Visit (INDEPENDENT_AMBULATORY_CARE_PROVIDER_SITE_OTHER): Payer: Commercial Managed Care - HMO | Admitting: Sports Medicine

## 2016-12-17 DIAGNOSIS — M170 Bilateral primary osteoarthritis of knee: Secondary | ICD-10-CM

## 2016-12-17 NOTE — Assessment & Plan Note (Signed)
4-5 month response to previous right knee injection. Initially he had declined Orthovisc due to cost, we will submitted again for this year. Aspiration and injection as above. Return to see me for Orthovisc injections.

## 2016-12-17 NOTE — Telephone Encounter (Signed)
-----   Message from Silverio Decamp, MD sent at 12/17/2016  9:39 AM EDT ----- Right knee Orthovisc approval please. He declined last year due to cost, it may be better this year with the new systems in place. ___________________________________________ Gwen Her. Dianah Field, M.D., ABFM., CAQSM. Primary Care and [place] Instructor of [place] [employer] [facility] [employer]

## 2016-12-17 NOTE — Telephone Encounter (Signed)
Submitted for approval on Orthovisc. Awaiting confirmation.  

## 2016-12-17 NOTE — Progress Notes (Signed)
  Subjective:    CC: Right knee pain  HPI: This is a pleasant 71 year old male with known knee osteoarthritis, we last drained and injected his knee about 4-5 months ago, with a good response, now having a recurrence of pain, moderate, persistent, localized at the medial joint line without radiation or mechanical symptoms. Desires are. Interventional treatment today. He declined Orthovisc previously last year, due to cost.  Past medical history:  Negative.  See flowsheet/record as well for more information.  Surgical history: Negative.  See flowsheet/record as well for more information.  Family history: Negative.  See flowsheet/record as well for more information.  Social history: Negative.  See flowsheet/record as well for more information.  Allergies, and medications have been entered into the medical record, reviewed, and no changes needed.   Review of Systems: No fevers, chills, night sweats, weight loss, chest pain, or shortness of breath.   Objective:    General: Well Developed, well nourished, and in no acute distress.  Neuro: Alert and oriented x3, extra-ocular muscles intact, sensation grossly intact.  HEENT: Normocephalic, atraumatic, pupils equal round reactive to light, neck supple, no masses, no lymphadenopathy, thyroid nonpalpable.  Skin: Warm and dry, no rashes. Cardiac: Regular rate and rhythm, no murmurs rubs or gallops, no lower extremity edema.  Respiratory: Clear to auscultation bilaterally. Not using accessory muscles, speaking in full sentences. Right Knee: Visibly swollen with a palpable fluid wave, and tenderness at the medial joint line as well as the patellar facets ROM normal in flexion and extension and lower leg rotation. Ligaments with solid consistent endpoints including ACL, PCL, LCL, MCL. Negative Mcmurray's and provocative meniscal tests. Non painful patellar compression. Patellar and quadriceps tendons unremarkable. Hamstring and quadriceps strength is  normal.  Procedure: Real-time Ultrasound Guided aspiration/injection of right knee Device: GE Logiq E  Verbal informed consent obtained.  Time-out conducted.  Noted no overlying erythema, induration, or other signs of local infection.  Skin prepped in a sterile fashion.  Local anesthesia: Topical Ethyl chloride.  With sterile technique and under real time ultrasound guidance:  Using 18-gauge needle aspirated 38 mL of serosanguineous fluid from his right knee, syringe was switched and I then injected 1 mL kenalog 40, 2 mL lidocaine, 2 mL bupivacaine. Completed without difficulty  Pain immediately resolved suggesting accurate placement of the medication.  Advised to call if fevers/chills, erythema, induration, drainage, or persistent bleeding.  Images permanently stored and available for review in the ultrasound unit.  Impression: Technically successful ultrasound guided injection.  Impression and Recommendations:    Primary osteoarthritis of both knees 4-5 month response to previous right knee injection. Initially he had declined Orthovisc due to cost, we will submitted again for this year. Aspiration and injection as above. Return to see me for Orthovisc injections.

## 2016-12-22 NOTE — Telephone Encounter (Signed)
Pt returned clinic call, advised of insurance information. He will proceed with injections. He currently is with his son who is in the ICU, when he gets back in town he will call to schedule. Unsure how long he will be out of town.

## 2016-12-22 NOTE — Telephone Encounter (Signed)
Received the following information from [place] benefits investigation:   Orthovisc is covered. After a copay of $45 has been met, the insurance will cover 100% of the allowable amount. A PA is not required for Orthovisc. Buy and [place] is permitted. Specialty pharmacy option is [facility] [phone]. Call reference number is [id number].  Called and attempted to go over information, Pt had to end call. Will call back.

## 2016-12-24 ENCOUNTER — Telehealth: Payer: Self-pay

## 2016-12-24 MED ORDER — METOPROLOL TARTRATE 50 MG PO TABS: 50.0000 mg | ORAL_TABLET | Freq: Two times a day (BID) | ORAL | 0 refills | Status: DC

## 2016-12-24 MED ORDER — FENOFIBRATE 160 MG PO TABS: 160.0000 mg | ORAL_TABLET | Freq: Every day | ORAL | 0 refills | Status: DC

## 2016-12-24 MED ORDER — METFORMIN HCL ER 500 MG PO TB24: 500.0000 mg | ORAL_TABLET | Freq: Every day | ORAL | 0 refills | Status: DC

## 2016-12-24 MED ORDER — FLUOXETINE HCL 20 MG PO CAPS: 20.0000 mg | ORAL_CAPSULE | Freq: Every day | ORAL | 0 refills | Status: DC

## 2016-12-24 NOTE — Telephone Encounter (Signed)
Sent 30 supply on medications. Patient is stuck out of town unexpectedly and needs refills to get him through the next week.

## 2017-01-05 ENCOUNTER — Encounter: Payer: Self-pay | Admitting: Sports Medicine

## 2017-01-05 ENCOUNTER — Ambulatory Visit (INDEPENDENT_AMBULATORY_CARE_PROVIDER_SITE_OTHER): Payer: Commercial Managed Care - HMO | Admitting: Sports Medicine

## 2017-01-05 DIAGNOSIS — M170 Bilateral primary osteoarthritis of knee: Secondary | ICD-10-CM | POA: Diagnosis not present

## 2017-01-05 NOTE — Assessment & Plan Note (Signed)
Orthovisc injection of one of 4 into the right knee, return in one week for #2. We have done a series of Supartz back in 2013 that worked fairly well, at this point he would like to also talk to orthopedic surgery to consider arthroplasty.

## 2017-01-05 NOTE — Progress Notes (Signed)
  Procedure: Real-time Ultrasound Guided aspiration/Injection of right knee Device: GE Logiq E  Verbal informed consent obtained.  Time-out conducted.  Noted no overlying erythema, induration, or other signs of local infection.  Skin prepped in a sterile fashion.  Local anesthesia: Topical Ethyl chloride.  With sterile technique and under real time ultrasound guidance:   20 mL straw-colored fluid aspirated, syringe switched and 30 mg/2 mL of OrthoVisc (sodium hyaluronate) in a prefilled syringe was injected easily into the knee through an 18-gauge needle. Completed without difficulty  Pain immediately resolved suggesting accurate placement of the medication.  Advised to call if fevers/chills, erythema, induration, drainage, or persistent bleeding.  Images permanently stored and available for review in the ultrasound unit.  Impression: Technically successful ultrasound guided injection.

## 2017-01-10 ENCOUNTER — Encounter: Payer: Self-pay | Admitting: Sports Medicine

## 2017-01-10 ENCOUNTER — Ambulatory Visit: Payer: Commercial Managed Care - HMO | Admitting: Sports Medicine

## 2017-01-10 DIAGNOSIS — M170 Bilateral primary osteoarthritis of knee: Secondary | ICD-10-CM

## 2017-01-10 NOTE — Progress Notes (Signed)
  Procedure: Real-time Ultrasound Guided aspiration/Injection of right knee Device: GE Logiq E  Verbal informed consent obtained.  Time-out conducted.  Noted no overlying erythema, induration, or other signs of local infection.  Skin prepped in a sterile fashion.  Local anesthesia: Topical Ethyl chloride.  With sterile technique and under real time ultrasound guidance:   17 mL straw-colored fluid aspirated, syringe switched and 30 mg/2 mL of OrthoVisc (sodium hyaluronate) in a prefilled syringe was injected easily into the knee through an 18-gauge needle. Completed without difficulty  Pain immediately resolved suggesting accurate placement of the medication.  Advised to call if fevers/chills, erythema, induration, drainage, or persistent bleeding.  Images permanently stored and available for review in the ultrasound unit.  Impression: Technically successful ultrasound guided injection.

## 2017-01-10 NOTE — Assessment & Plan Note (Signed)
Orthovisc injection #2 of 4 into the right knee, return in one week for #3.

## 2017-01-11 DIAGNOSIS — M1712 Unilateral primary osteoarthritis, left knee: Secondary | ICD-10-CM | POA: Diagnosis not present

## 2017-01-11 DIAGNOSIS — M1711 Unilateral primary osteoarthritis, right knee: Secondary | ICD-10-CM | POA: Diagnosis not present

## 2017-01-12 ENCOUNTER — Ambulatory Visit: Payer: Commercial Managed Care - HMO | Admitting: Sports Medicine

## 2017-01-19 ENCOUNTER — Ambulatory Visit: Payer: Medicare HMO | Admitting: Sports Medicine

## 2017-01-20 ENCOUNTER — Ambulatory Visit (INDEPENDENT_AMBULATORY_CARE_PROVIDER_SITE_OTHER): Payer: Medicare HMO | Admitting: Sports Medicine

## 2017-01-20 ENCOUNTER — Encounter: Payer: Self-pay | Admitting: Sports Medicine

## 2017-01-20 DIAGNOSIS — M170 Bilateral primary osteoarthritis of knee: Secondary | ICD-10-CM

## 2017-01-20 NOTE — Assessment & Plan Note (Signed)
Orthovisc injection #3 of 4 into the right knee, return in one week for #4

## 2017-01-20 NOTE — Progress Notes (Signed)
  Procedure: Real-time Ultrasound Guided aspiration/Injection of right knee Device: GE Logiq E  Verbal informed consent obtained.  Time-out conducted.  Noted no overlying erythema, induration, or other signs of local infection.  Skin prepped in a sterile fashion.  Local anesthesia: Topical Ethyl chloride.  With sterile technique and under real time ultrasound guidance:   12 mL straw-colored fluid aspirated, syringe switched and 30 mg/2 mL of OrthoVisc (sodium hyaluronate) in a prefilled syringe was injected easily into the knee through an 18-gauge needle. Completed without difficulty  Pain immediately resolved suggesting accurate placement of the medication.  Advised to call if fevers/chills, erythema, induration, drainage, or persistent bleeding.  Images permanently stored and available for review in the ultrasound unit.  Impression: Technically successful ultrasound guided injection.

## 2017-01-24 ENCOUNTER — Ambulatory Visit: Payer: Medicare HMO | Admitting: Family Medicine

## 2017-01-26 ENCOUNTER — Ambulatory Visit: Payer: Commercial Managed Care - HMO | Admitting: Sports Medicine

## 2017-01-27 ENCOUNTER — Ambulatory Visit (INDEPENDENT_AMBULATORY_CARE_PROVIDER_SITE_OTHER): Payer: Medicare HMO | Admitting: Family Medicine

## 2017-01-27 ENCOUNTER — Ambulatory Visit (INDEPENDENT_AMBULATORY_CARE_PROVIDER_SITE_OTHER): Payer: Medicare HMO | Admitting: Sports Medicine

## 2017-01-27 VITALS — BP 91/58 | HR 54 | Ht 67.0 in | Wt 194.0 lb

## 2017-01-27 DIAGNOSIS — Z952 Presence of prosthetic heart valve: Secondary | ICD-10-CM

## 2017-01-27 DIAGNOSIS — Z7901 Long term (current) use of anticoagulants: Secondary | ICD-10-CM | POA: Diagnosis not present

## 2017-01-27 DIAGNOSIS — M170 Bilateral primary osteoarthritis of knee: Secondary | ICD-10-CM | POA: Diagnosis not present

## 2017-01-27 DIAGNOSIS — R7301 Impaired fasting glucose: Secondary | ICD-10-CM | POA: Diagnosis not present

## 2017-01-27 LAB — POCT GLYCOSYLATED HEMOGLOBIN (HGB A1C): HEMOGLOBIN A1C: 6.4

## 2017-01-27 LAB — POCT INR: INR: 1.8

## 2017-01-27 NOTE — Progress Notes (Signed)
  Procedure: Real-time Ultrasound Guided aspiration/Injection of right knee Device: GE Logiq E  Verbal informed consent obtained.  Time-out conducted.  Noted no overlying erythema, induration, or other signs of local infection.  Skin prepped in a sterile fashion.  Local anesthesia: Topical Ethyl chloride.  With sterile technique and under real time ultrasound guidance:   16 mL straw-colored fluid aspirated, syringe switched and 30 mg/2 mL of OrthoVisc (sodium hyaluronate) in a prefilled syringe was injected easily into the knee through an 18-gauge needle. Completed without difficulty  Pain immediately resolved suggesting accurate placement of the medication.  Advised to call if fevers/chills, erythema, induration, drainage, or persistent bleeding.  Images permanently stored and available for review in the ultrasound unit.  Impression: Technically successful ultrasound guided injection.

## 2017-01-27 NOTE — Assessment & Plan Note (Signed)
Aspiration and Orthovisc injection #4 of 4 into the right knee, he does have arthroplasty scheduled for a couple of months.

## 2017-01-27 NOTE — Progress Notes (Signed)
.  Pt here for INR check no missed doses, diet changes,bruising,bleeding,CP,SOB.Todd Parsons   

## 2017-01-27 NOTE — Patient Instructions (Signed)
Increase dose.   Take half a tab 2 days a week, Tuesday and Saturday and a whole tab the other 5 days of the week. Recheck INR in 2 weeks.

## 2017-01-27 NOTE — Progress Notes (Signed)
Subjective:    CC:  IFG,  Coumadin.   HPI:  Impaired fasting glucose-no increased thirst or urination. No symptoms consistent with hypoglycemia.  History of aortic valve replacement-due for INR today. He is currently taking 5 mg on Monday Wednesday Friday and Saturday and 2.5 mg on Tuesday Thursday and Sunday.  He has met with the surgeon and is planning on getting a right knee replacement. They have sent over forms for operative clearance.   Objective:    General: Well Developed, well nourished, and in no acute distress.  Neuro: Alert and oriented x3, extra-ocular muscles intact, sensation grossly intact.  HEENT: Normocephalic, atraumatic  Skin: Warm and dry, no rashes. Cardiac: Regular rate and rhythm, no murmurs rubs or gallops, no lower extremity edema.  Respiratory: Clear to auscultation bilaterally. Not using accessory muscles, speaking in full sentences.   Impression and Recommendations:   IFG - A1c down to 6.4. Just slight improvement but he admits he still eating a lot of sweets. We had a discussion today about possibly increasing his metformin versus getting back on track with diet and exercise. He says he can opt for working on his diet. I'll see him back in 4 months.  Lab Results  Component Value Date   HGBA1C 6.4 01/27/2017   History of aortic valve replacement-see the anticoagulation flowsheet for adjustments. He is subtherapeutic today.Increase dose.   Take half a tab 2 days a week, Tuesday and Saturday and a whole tab the other 5 days of the week. Recheck INR in 2 weeks.  Right knee*arthritis-because he needs clearance we actually had them fax the forms ever to his cardiologist for approval since he has history of valve replacement which makes him much more complicated. Dr. Lamar Blinks is his cardiologist.

## 2017-02-10 ENCOUNTER — Ambulatory Visit (INDEPENDENT_AMBULATORY_CARE_PROVIDER_SITE_OTHER): Payer: Medicare HMO | Admitting: Family Medicine

## 2017-02-10 DIAGNOSIS — Z01818 Encounter for other preprocedural examination: Secondary | ICD-10-CM | POA: Diagnosis not present

## 2017-02-10 DIAGNOSIS — Z951 Presence of aortocoronary bypass graft: Secondary | ICD-10-CM | POA: Diagnosis not present

## 2017-02-10 DIAGNOSIS — Z952 Presence of prosthetic heart valve: Secondary | ICD-10-CM | POA: Diagnosis not present

## 2017-02-10 DIAGNOSIS — I10 Essential (primary) hypertension: Secondary | ICD-10-CM | POA: Diagnosis not present

## 2017-02-10 DIAGNOSIS — Z7901 Long term (current) use of anticoagulants: Secondary | ICD-10-CM | POA: Diagnosis not present

## 2017-02-10 DIAGNOSIS — I2510 Atherosclerotic heart disease of native coronary artery without angina pectoris: Secondary | ICD-10-CM | POA: Diagnosis not present

## 2017-02-10 LAB — POCT INR: INR: 2.6

## 2017-02-10 NOTE — Progress Notes (Signed)
Todd Parsons saw Dr Mauricio Po today and he has stopped the coumadin and reduced the metoprolol to once daily.

## 2017-02-15 ENCOUNTER — Other Ambulatory Visit: Payer: Self-pay | Admitting: Orthopedic Surgery

## 2017-02-17 ENCOUNTER — Encounter (HOSPITAL_COMMUNITY): Payer: Self-pay

## 2017-02-17 NOTE — Pre-Procedure Instructions (Signed)
Todd Parsons  02/17/2017      [place], [address] [place] [address] Phone: [phone] Fax: [phone]  [facility] - [place], [address] [address] Phone: [phone] Fax: [phone]  [id number] - [place], [place] - [id number] [place] [address] Phone: [phone] Fax: [phone]  [place] ([place]), [place] - [address] [place] ([place]) [place] [address] Phone: [phone] Fax: [phone]    Your procedure is scheduled on Monday, February 28, 2017.  Report to [facility] Admitting at 10:25 A.M.  Call this number if you have problems the morning of surgery:  [phone]   Remember:  Do not eat food or drink liquids after midnight.  Take these medicines the morning of surgery with A SIP OF WATER:  Amlodipine (Norvasc)  Fluoxetine (Prozac)  Fluticasone (Flonase) - if needed  Metoprolol (Lopressor)  Oxybutynin (Ditropan)   7 days prior to surgery STOP taking any Aspirin, Aleve, Naproxen, Ibuprofen, Motrin, Advil, Goody's, BC's, all herbal medications, fish oil, and all vitamins    How to Manage Your Diabetes Before and After Surgery  Why is it important to control my blood sugar before and after surgery? . Improving blood sugar levels before and after surgery helps healing and can limit problems. . A way of improving blood sugar control is eating a healthy diet by: o  Eating less sugar and carbohydrates o  Increasing activity/exercise o  Talking with your doctor about reaching your blood sugar goals . High blood sugars (greater than 180 mg/dL) can raise your risk of infections and slow your recovery, so you will need to focus on controlling your diabetes during the  weeks before surgery. . Make sure that the doctor who takes care of your diabetes knows about your planned surgery including the date and location.  How do I manage my blood sugar before surgery? . Check your blood sugar at least 4 times a day, starting 2 days before surgery, to make sure that the level is not too high or low. o Check your blood sugar the morning of your surgery when you wake up and every 2 hours until you get to the Short Stay unit. . If your blood sugar is less than 70 mg/dL, you will need to treat for low blood sugar: o Do not take insulin. o Treat a low blood sugar (less than 70 mg/dL) with  cup of clear juice (cranberry or apple), 4 glucose tablets, OR glucose gel. o Recheck blood sugar in 15 minutes after treatment (to make sure it is greater than 70 mg/dL). If your blood sugar is not greater than 70 mg/dL on recheck, call [phone] for further instructions. . Report your blood sugar to the short stay nurse when you get to Short Stay.  . If you are admitted to the hospital after surgery: o Your blood sugar will be checked by the staff and you will probably be given insulin after surgery (instead of oral diabetes medicines) to make sure you have good blood sugar levels. o The goal for blood sugar control after surgery is 80-180 mg/dL.    WHAT DO I DO ABOUT MY DIABETES MEDICATION?   Marland Kitchen Do not take oral diabetes medicines (pills)  the morning of surgery.     Do not wear jewelry, make-up or nail polish.  Do not wear lotions, powders, or perfumes, or deodorant.  Do not shave 48 hours prior to surgery.  Men may shave face and neck.  Do not bring valuables to the hospital.  [facility] is not responsible for any belongings or valuables.  Contacts, dentures or bridgework may not be worn into surgery.  Leave your suitcase in the car.  After surgery it may be brought to your room.  For patients admitted to the hospital, discharge time will be determined by your  treatment team.  Patients discharged the day of surgery will not be allowed to drive home.   Name and phone number of your driver:    Special instructions:    [facility]- Preparing For Surgery  Before surgery, you can play an important role. Because skin is not sterile, your skin needs to be as free of germs as possible. You can reduce the number of germs on your skin by washing with CHG (chlorahexidine gluconate) Soap before surgery.  CHG is an antiseptic cleaner which kills germs and bonds with the skin to continue killing germs even after washing.  Please do not use if you have an allergy to CHG or antibacterial soaps. If your skin becomes reddened/irritated stop using the CHG.  Do not shave (including legs and underarms) for at least 48 hours prior to first CHG shower. It is OK to shave your face.  Please follow these instructions carefully.   1. Shower the NIGHT BEFORE SURGERY and the MORNING OF SURGERY with CHG.   2. If you chose to wash your hair, wash your hair first as usual with your normal shampoo.  3. After you shampoo, rinse your hair and body thoroughly to remove the shampoo.  4. Use CHG as you would any other liquid soap. You can apply CHG directly to the skin and wash gently with a scrungie or a clean washcloth.   5. Apply the CHG Soap to your body ONLY FROM THE NECK DOWN.  Do not use on open wounds or open sores. Avoid contact with your eyes, ears, mouth and genitals (private parts). Wash genitals (private parts) with your normal soap.  6. Wash thoroughly, paying special attention to the area where your surgery will be performed.  7. Thoroughly rinse your body with warm water from the neck down.  8. DO NOT shower/wash with your normal soap after using and rinsing off the CHG Soap.  9. Pat yourself dry with a CLEAN TOWEL.   10. Wear CLEAN PAJAMAS   11. Place CLEAN SHEETS on your bed the night of your first shower and DO NOT SLEEP WITH PETS.    Day of  Surgery: Do not apply any deodorants/lotions. Please wear clean clothes to the hospital/surgery center.      Please read over the following fact sheets that you were given. Pain Booklet, Total Joint Packet, MRSA Information and Surgical Site Infection Prevention, and Incentive Spirometry

## 2017-02-18 ENCOUNTER — Encounter (HOSPITAL_COMMUNITY): Payer: Self-pay

## 2017-02-18 ENCOUNTER — Encounter (HOSPITAL_COMMUNITY)
Admission: RE | Admit: 2017-02-18 | Discharge: 2017-02-18 | Disposition: A | Discharge status: Home / Self Care | Payer: Medicare HMO | Source: Ambulatory Visit | Attending: Orthopedic Surgery | Admitting: Orthopedic Surgery

## 2017-02-18 DIAGNOSIS — M1711 Unilateral primary osteoarthritis, right knee: Secondary | ICD-10-CM | POA: Diagnosis not present

## 2017-02-18 DIAGNOSIS — Z01818 Encounter for other preprocedural examination: Secondary | ICD-10-CM | POA: Diagnosis not present

## 2017-02-18 HISTORY — DX: Prediabetes: R73.03

## 2017-02-18 HISTORY — DX: Atherosclerotic heart disease of native coronary artery without angina pectoris: I25.10

## 2017-02-18 HISTORY — DX: Unspecified osteoarthritis, unspecified site: M19.90

## 2017-02-18 HISTORY — DX: Cardiac arrhythmia, unspecified: I49.9

## 2017-02-18 HISTORY — DX: Sleep apnea, unspecified: G47.30

## 2017-02-18 LAB — URINALYSIS, ROUTINE W REFLEX MICROSCOPIC
BILIRUBIN URINE: NEGATIVE
GLUCOSE, UA: NEGATIVE mg/dL
Hgb urine dipstick: NEGATIVE
KETONES UR: NEGATIVE mg/dL
LEUKOCYTES UA: NEGATIVE
Nitrite: NEGATIVE
PH: 5 (ref 5.0–8.0)
Protein, ur: NEGATIVE mg/dL
SPECIFIC GRAVITY, URINE: 1.012 (ref 1.005–1.030)

## 2017-02-18 LAB — CBC WITH DIFFERENTIAL/PLATELET
Basophils Absolute: 0 10*3/uL (ref 0.0–0.1)
Basophils Relative: 0 %
EOS PCT: 5 %
Eosinophils Absolute: 0.4 10*3/uL (ref 0.0–0.7)
HCT: 48.2 % (ref 39.0–52.0)
Hemoglobin: 15.5 g/dL (ref 13.0–17.0)
LYMPHS ABS: 3.1 10*3/uL (ref 0.7–4.0)
LYMPHS PCT: 35 %
MCH: 29.1 pg (ref 26.0–34.0)
MCHC: 32.2 g/dL (ref 30.0–36.0)
MCV: 90.4 fL (ref 78.0–100.0)
MONO ABS: 0.8 10*3/uL (ref 0.1–1.0)
MONOS PCT: 9 %
Neutro Abs: 4.5 10*3/uL (ref 1.7–7.7)
Neutrophils Relative %: 51 %
PLATELETS: 199 10*3/uL (ref 150–400)
RBC: 5.33 MIL/uL (ref 4.22–5.81)
RDW: 13.9 % (ref 11.5–15.5)
WBC: 8.8 10*3/uL (ref 4.0–10.5)

## 2017-02-18 LAB — COMPREHENSIVE METABOLIC PANEL
ALT: 29 U/L (ref 17–63)
AST: 33 U/L (ref 15–41)
Albumin: 4.3 g/dL (ref 3.5–5.0)
Alkaline Phosphatase: 59 U/L (ref 38–126)
Anion gap: 8 (ref 5–15)
BUN: 16 mg/dL (ref 6–20)
CHLORIDE: 103 mmol/L (ref 101–111)
CO2: 26 mmol/L (ref 22–32)
Calcium: 9.7 mg/dL (ref 8.9–10.3)
Creatinine, Ser: 1.12 mg/dL (ref 0.61–1.24)
Glucose, Bld: 102 mg/dL — ABNORMAL HIGH (ref 65–99)
POTASSIUM: 4 mmol/L (ref 3.5–5.1)
Sodium: 137 mmol/L (ref 135–145)
TOTAL PROTEIN: 7 g/dL (ref 6.5–8.1)
Total Bilirubin: 0.7 mg/dL (ref 0.3–1.2)

## 2017-02-18 LAB — PROTIME-INR
INR: 1.1
PROTHROMBIN TIME: 14.3 s (ref 11.4–15.2)

## 2017-02-18 LAB — TYPE AND SCREEN
ABO/RH(D): O POS
ANTIBODY SCREEN: NEGATIVE

## 2017-02-18 LAB — SURGICAL PCR SCREEN
MRSA, PCR: NEGATIVE
Staphylococcus aureus: NEGATIVE

## 2017-02-18 LAB — APTT: APTT: 29 s (ref 24–36)

## 2017-02-18 LAB — ABO/RH: ABO/RH(D): O POS

## 2017-02-18 NOTE — Progress Notes (Signed)
Requested cardiac clearance letter, EKG, office note, echo, and stress test from Dr. Mauricio Po at [facility], ph# [phone]  Asking anesthesia to review this chart due to the patient's cardia history.

## 2017-02-21 NOTE — Progress Notes (Addendum)
Anesthesia Chart Review:  Pt is a 71 year old male scheduled for R total knee arthroplasty on 02/28/2017 with Dorna Leitz, MD  - PCP is Beatrice Lecher, MD - Cardiologist is Gabriel Rung, MD who cleared pt for surgery at last office visit 02/10/17 (notes in care everywhere); however, his note indicates pt needs an echo prior to surgery.  I reached out to his office.  Darlene returned my call; Dr. Beverlyn Roux clarified his note and the patient does not need the echo prior to surgery (it is only for routine follow up)  PMH includes:  CAD (s/p CABG 08/05/15 at [facility] (LIMA-LAD, SVG-1st diagonal, SVG-RCA)), bicuspid aortic valve (s/p AVR 08/05/15), atrial flutter (s/p successful ablation 10/20/15 at [facility]), HTN, hyperlipidemia, pre-diabetes. Former smoker. BMI 31.  Medications include: amlodipine, lipitor, fenofibrate, metformin, metoprolol, terazosin  Preoperative labs reviewed.    CXR 09/07/16:  1. Shallow lung inflation with bibasilar fibrotic lung changes, similar to previous. No other active cardiopulmonary disease identified. 2. Mild cardiomegaly without pulmonary edema.  EKG 02/10/17 [facility]): SinusBradycardia. Poor R-wave progression -nonspecific -consider old anterior infarct. Nonspecific T-abnormality.  Echo 09/29/15:  1. LV normal in size. Mild concentric LVH. Septal motion is consistent with post-operative state. Mild diffuse hypokinesis of the LV. LVEF moderately reduced at 40-45%. Grade II moderate diastolic dysfunction; pseudo-normal mitral inflow pattern. 2. LA moderately dilated. 3. RV systolic pressure normal. 4. Bioprosthetic valve in the aortic position appears well-seated and functions normally. Prosthetic aortic valve peak and/or mean gradients are normal.  If no changes, I anticipate pt can proceed with surgery as scheduled.   Willeen Cass, FNP-BC [facility]/Anesthesiology Phone: [phone] 02/22/2017 10:30 AM

## 2017-02-25 MED ORDER — CEFAZOLIN SODIUM-DEXTROSE 2-4 GM/100ML-% IV SOLN
2.0000 g | INTRAVENOUS | Status: DC
  Filled 2017-02-25: qty 100

## 2017-02-28 ENCOUNTER — Encounter (HOSPITAL_COMMUNITY): Payer: Self-pay | Admitting: *Deleted

## 2017-02-28 ENCOUNTER — Encounter (HOSPITAL_COMMUNITY)
Admission: RE | Disposition: A | Discharge status: Home Health | Payer: Self-pay | Source: Ambulatory Visit | Attending: Orthopedic Surgery

## 2017-02-28 ENCOUNTER — Inpatient Hospital Stay (HOSPITAL_COMMUNITY): Payer: Medicare HMO | Admitting: Vascular Surgery

## 2017-02-28 ENCOUNTER — Inpatient Hospital Stay (HOSPITAL_COMMUNITY)
Admission: RE | Admit: 2017-02-28 | Discharge: 2017-03-02 | DRG: 470 | Disposition: A | Discharge status: Home Health | Payer: Medicare HMO | Source: Ambulatory Visit | Attending: Orthopedic Surgery | Admitting: Orthopedic Surgery

## 2017-02-28 ENCOUNTER — Inpatient Hospital Stay (HOSPITAL_COMMUNITY): Payer: Medicare HMO | Admitting: Certified Registered"

## 2017-02-28 DIAGNOSIS — G4733 Obstructive sleep apnea (adult) (pediatric): Secondary | ICD-10-CM | POA: Diagnosis not present

## 2017-02-28 DIAGNOSIS — Z951 Presence of aortocoronary bypass graft: Secondary | ICD-10-CM | POA: Diagnosis not present

## 2017-02-28 DIAGNOSIS — E785 Hyperlipidemia, unspecified: Secondary | ICD-10-CM | POA: Diagnosis not present

## 2017-02-28 DIAGNOSIS — M1711 Unilateral primary osteoarthritis, right knee: Secondary | ICD-10-CM | POA: Diagnosis not present

## 2017-02-28 DIAGNOSIS — I2510 Atherosclerotic heart disease of native coronary artery without angina pectoris: Secondary | ICD-10-CM | POA: Diagnosis present

## 2017-02-28 DIAGNOSIS — Z7982 Long term (current) use of aspirin: Secondary | ICD-10-CM

## 2017-02-28 DIAGNOSIS — R7303 Prediabetes: Secondary | ICD-10-CM | POA: Diagnosis not present

## 2017-02-28 DIAGNOSIS — Z8249 Family history of ischemic heart disease and other diseases of the circulatory system: Secondary | ICD-10-CM

## 2017-02-28 DIAGNOSIS — Z87891 Personal history of nicotine dependence: Secondary | ICD-10-CM

## 2017-02-28 DIAGNOSIS — M25561 Pain in right knee: Secondary | ICD-10-CM | POA: Diagnosis present

## 2017-02-28 DIAGNOSIS — Z79899 Other long term (current) drug therapy: Secondary | ICD-10-CM | POA: Diagnosis not present

## 2017-02-28 DIAGNOSIS — M170 Bilateral primary osteoarthritis of knee: Secondary | ICD-10-CM | POA: Diagnosis not present

## 2017-02-28 DIAGNOSIS — Z888 Allergy status to other drugs, medicaments and biological substances status: Secondary | ICD-10-CM | POA: Diagnosis not present

## 2017-02-28 DIAGNOSIS — Z7901 Long term (current) use of anticoagulants: Secondary | ICD-10-CM | POA: Diagnosis not present

## 2017-02-28 DIAGNOSIS — Z952 Presence of prosthetic heart valve: Secondary | ICD-10-CM

## 2017-02-28 DIAGNOSIS — I739 Peripheral vascular disease, unspecified: Secondary | ICD-10-CM | POA: Diagnosis not present

## 2017-02-28 DIAGNOSIS — I10 Essential (primary) hypertension: Secondary | ICD-10-CM | POA: Diagnosis not present

## 2017-02-28 DIAGNOSIS — G8918 Other acute postprocedural pain: Secondary | ICD-10-CM | POA: Diagnosis not present

## 2017-02-28 HISTORY — PX: TOTAL KNEE ARTHROPLASTY: SHX125

## 2017-02-28 LAB — GLUCOSE, CAPILLARY
Glucose-Capillary: 101 mg/dL — ABNORMAL HIGH (ref 65–99)
Glucose-Capillary: 207 mg/dL — ABNORMAL HIGH (ref 65–99)

## 2017-02-28 SURGERY — ARTHROPLASTY, KNEE, TOTAL
Anesthesia: Monitor Anesthesia Care | Site: Knee | Laterality: Right

## 2017-02-28 MED ORDER — OXYCODONE HCL 5 MG PO TABS
5.0000 mg | ORAL_TABLET | ORAL | Status: DC | PRN
  Administered 2017-02-28 – 2017-03-02 (×7): 10 mg via ORAL
  Filled 2017-02-28 (×7): qty 2

## 2017-02-28 MED ORDER — DEXAMETHASONE SODIUM PHOSPHATE 10 MG/ML IJ SOLN
INTRAMUSCULAR | Status: DC | PRN
  Administered 2017-02-28: 8 mg via INTRAVENOUS

## 2017-02-28 MED ORDER — ATORVASTATIN CALCIUM 40 MG PO TABS
40.0000 mg | ORAL_TABLET | Freq: Every day | ORAL | Status: DC
  Administered 2017-02-28 – 2017-03-01 (×2): 40 mg via ORAL
  Filled 2017-02-28 (×2): qty 1

## 2017-02-28 MED ORDER — METOPROLOL TARTRATE 50 MG PO TABS
50.0000 mg | ORAL_TABLET | Freq: Two times a day (BID) | ORAL | Status: DC
  Administered 2017-03-01 – 2017-03-02 (×3): 50 mg via ORAL
  Filled 2017-02-28 (×4): qty 1

## 2017-02-28 MED ORDER — POLYETHYLENE GLYCOL 3350 17 G PO PACK
17.0000 g | PACK | Freq: Every day | ORAL | Status: DC | PRN
  Administered 2017-03-01: 17 g via ORAL
  Filled 2017-02-28: qty 1

## 2017-02-28 MED ORDER — DOCUSATE SODIUM 100 MG PO CAPS: 100.0000 mg | ORAL_CAPSULE | Freq: Two times a day (BID) | ORAL | 0 refills | Status: DC

## 2017-02-28 MED ORDER — MAGNESIUM CITRATE PO SOLN: 1.0000 | Freq: Once | ORAL | Status: DC | PRN

## 2017-02-28 MED ORDER — CEFAZOLIN SODIUM-DEXTROSE 2-4 GM/100ML-% IV SOLN
2.0000 g | Freq: Four times a day (QID) | INTRAVENOUS | Status: AC
Start: 2017-02-28 — End: 2017-03-01
  Administered 2017-02-28 – 2017-03-01 (×2): 2 g via INTRAVENOUS
  Filled 2017-02-28 (×2): qty 100

## 2017-02-28 MED ORDER — INSULIN ASPART 100 UNIT/ML ~~LOC~~ SOLN
0.0000 [IU] | Freq: Three times a day (TID) | SUBCUTANEOUS | Status: DC
  Administered 2017-03-01: 5 [IU] via SUBCUTANEOUS
  Administered 2017-03-01 – 2017-03-02 (×3): 3 [IU] via SUBCUTANEOUS
  Administered 2017-03-02: 2 [IU] via SUBCUTANEOUS

## 2017-02-28 MED ORDER — CHLORHEXIDINE GLUCONATE 4 % EX LIQD: 60.0000 mL | Freq: Once | CUTANEOUS | Status: DC

## 2017-02-28 MED ORDER — PROPOFOL 10 MG/ML IV BOLUS
INTRAVENOUS | Status: AC
  Filled 2017-02-28: qty 20

## 2017-02-28 MED ORDER — SODIUM CHLORIDE 0.9 % IJ SOLN
INTRAMUSCULAR | Status: DC | PRN
  Administered 2017-02-28: 20 mL

## 2017-02-28 MED ORDER — TERAZOSIN HCL 2 MG PO CAPS
2.0000 mg | ORAL_CAPSULE | Freq: Every day | ORAL | Status: DC
  Administered 2017-02-28 – 2017-03-01 (×2): 2 mg via ORAL
  Filled 2017-02-28 (×2): qty 1

## 2017-02-28 MED ORDER — ACETAMINOPHEN 650 MG RE SUPP: 650.0000 mg | Freq: Four times a day (QID) | RECTAL | Status: DC | PRN

## 2017-02-28 MED ORDER — GABAPENTIN 300 MG PO CAPS
300.0000 mg | ORAL_CAPSULE | Freq: Two times a day (BID) | ORAL | Status: DC
  Administered 2017-02-28 – 2017-03-02 (×4): 300 mg via ORAL
  Filled 2017-02-28 (×4): qty 1

## 2017-02-28 MED ORDER — ALUM & MAG HYDROXIDE-SIMETH 200-200-20 MG/5ML PO SUSP: 30.0000 mL | ORAL | Status: DC | PRN

## 2017-02-28 MED ORDER — BUPIVACAINE LIPOSOME 1.3 % IJ SUSP
20.0000 mL | INTRAMUSCULAR | Status: AC
  Administered 2017-02-28: 20 mL
  Filled 2017-02-28: qty 20

## 2017-02-28 MED ORDER — FENOFIBRATE 160 MG PO TABS
160.0000 mg | ORAL_TABLET | Freq: Every day | ORAL | Status: DC
  Administered 2017-03-01 – 2017-03-02 (×2): 160 mg via ORAL
  Filled 2017-02-28 (×2): qty 1

## 2017-02-28 MED ORDER — TIZANIDINE HCL 2 MG PO TABS: 2.0000 mg | ORAL_TABLET | Freq: Three times a day (TID) | ORAL | 0 refills | Status: DC | PRN

## 2017-02-28 MED ORDER — 0.9 % SODIUM CHLORIDE (POUR BTL) OPTIME
TOPICAL | Status: DC | PRN
  Administered 2017-02-28: 3000 mL
  Administered 2017-02-28: 1000 mL

## 2017-02-28 MED ORDER — BUPIVACAINE IN DEXTROSE 0.75-8.25 % IT SOLN
INTRATHECAL | Status: DC | PRN
  Administered 2017-02-28: 15 mg via INTRATHECAL

## 2017-02-28 MED ORDER — ACETAMINOPHEN 325 MG PO TABS: 650.0000 mg | ORAL_TABLET | Freq: Four times a day (QID) | ORAL | Status: DC | PRN

## 2017-02-28 MED ORDER — ASPIRIN EC 325 MG PO TBEC: 325.0000 mg | DELAYED_RELEASE_TABLET | Freq: Two times a day (BID) | ORAL | 0 refills | Status: DC

## 2017-02-28 MED ORDER — MIDAZOLAM HCL 2 MG/2ML IJ SOLN
2.0000 mg | Freq: Once | INTRAMUSCULAR | Status: AC
  Administered 2017-02-28: 2 mg via INTRAVENOUS
  Filled 2017-02-28: qty 2

## 2017-02-28 MED ORDER — FENTANYL CITRATE (PF) 100 MCG/2ML IJ SOLN
INTRAMUSCULAR | Status: AC
  Administered 2017-02-28: 50 ug via INTRAVENOUS
  Filled 2017-02-28: qty 2

## 2017-02-28 MED ORDER — CELECOXIB 200 MG PO CAPS
200.0000 mg | ORAL_CAPSULE | Freq: Two times a day (BID) | ORAL | Status: DC
  Administered 2017-02-28 – 2017-03-02 (×4): 200 mg via ORAL
  Filled 2017-02-28 (×4): qty 1

## 2017-02-28 MED ORDER — LACTATED RINGERS IV SOLN: INTRAVENOUS | Status: DC

## 2017-02-28 MED ORDER — ONDANSETRON HCL 4 MG/2ML IJ SOLN
INTRAMUSCULAR | Status: DC | PRN
  Administered 2017-02-28: 4 mg via INTRAVENOUS

## 2017-02-28 MED ORDER — CEFAZOLIN SODIUM-DEXTROSE 2-3 GM-% IV SOLR
INTRAVENOUS | Status: DC | PRN
  Administered 2017-02-28: 2 g via INTRAVENOUS

## 2017-02-28 MED ORDER — FENTANYL CITRATE (PF) 100 MCG/2ML IJ SOLN
50.0000 ug | Freq: Once | INTRAMUSCULAR | Status: AC
  Administered 2017-02-28: 50 ug via INTRAVENOUS
  Filled 2017-02-28: qty 1

## 2017-02-28 MED ORDER — SODIUM CHLORIDE 0.9 % IV SOLN
INTRAVENOUS | Status: DC
  Administered 2017-03-01: 01:00:00 via INTRAVENOUS

## 2017-02-28 MED ORDER — TRANEXAMIC ACID 1000 MG/10ML IV SOLN
1000.0000 mg | INTRAVENOUS | Status: AC
  Administered 2017-02-28: 1000 mg via INTRAVENOUS
  Filled 2017-02-28: qty 10

## 2017-02-28 MED ORDER — ONDANSETRON HCL 4 MG PO TABS: 4.0000 mg | ORAL_TABLET | Freq: Four times a day (QID) | ORAL | Status: DC | PRN

## 2017-02-28 MED ORDER — MIDAZOLAM HCL 2 MG/2ML IJ SOLN
INTRAMUSCULAR | Status: AC
  Administered 2017-02-28: 2 mg via INTRAVENOUS
  Filled 2017-02-28: qty 2

## 2017-02-28 MED ORDER — METFORMIN HCL ER 500 MG PO TB24
500.0000 mg | ORAL_TABLET | Freq: Every day | ORAL | Status: DC
  Administered 2017-03-01 – 2017-03-02 (×2): 500 mg via ORAL
  Filled 2017-02-28 (×2): qty 1

## 2017-02-28 MED ORDER — METHOCARBAMOL 500 MG PO TABS
500.0000 mg | ORAL_TABLET | Freq: Four times a day (QID) | ORAL | Status: DC | PRN
  Administered 2017-02-28 – 2017-03-02 (×4): 500 mg via ORAL
  Filled 2017-02-28 (×4): qty 1

## 2017-02-28 MED ORDER — BUPIVACAINE HCL (PF) 0.5 % IJ SOLN
INTRAMUSCULAR | Status: AC
  Filled 2017-02-28: qty 30

## 2017-02-28 MED ORDER — BISACODYL 5 MG PO TBEC: 5.0000 mg | DELAYED_RELEASE_TABLET | Freq: Every day | ORAL | Status: DC | PRN

## 2017-02-28 MED ORDER — PROMETHAZINE HCL 25 MG/ML IJ SOLN: 12.5000 mg | Freq: Four times a day (QID) | INTRAMUSCULAR | Status: DC | PRN

## 2017-02-28 MED ORDER — DEXAMETHASONE SODIUM PHOSPHATE 10 MG/ML IJ SOLN
10.0000 mg | Freq: Two times a day (BID) | INTRAMUSCULAR | Status: AC
  Administered 2017-02-28 – 2017-03-01 (×3): 10 mg via INTRAVENOUS
  Filled 2017-02-28 (×3): qty 1

## 2017-02-28 MED ORDER — BUPIVACAINE HCL (PF) 0.5 % IJ SOLN
INTRAMUSCULAR | Status: DC | PRN
  Administered 2017-02-28: 30 mL

## 2017-02-28 MED ORDER — FENTANYL CITRATE (PF) 100 MCG/2ML IJ SOLN: INTRAMUSCULAR | Status: DC | PRN

## 2017-02-28 MED ORDER — DIPHENHYDRAMINE HCL 12.5 MG/5ML PO ELIX: 12.5000 mg | ORAL_SOLUTION | ORAL | Status: DC | PRN

## 2017-02-28 MED ORDER — TRANEXAMIC ACID 1000 MG/10ML IV SOLN
1000.0000 mg | Freq: Once | INTRAVENOUS | Status: AC
  Administered 2017-02-28: 1000 mg via INTRAVENOUS
  Filled 2017-02-28: qty 10

## 2017-02-28 MED ORDER — PROPOFOL 500 MG/50ML IV EMUL
INTRAVENOUS | Status: DC | PRN
  Administered 2017-02-28: 75 ug/kg/min via INTRAVENOUS

## 2017-02-28 MED ORDER — ONDANSETRON HCL 4 MG/2ML IJ SOLN
INTRAMUSCULAR | Status: AC
  Filled 2017-02-28: qty 2

## 2017-02-28 MED ORDER — BUPIVACAINE-EPINEPHRINE (PF) 0.5% -1:200000 IJ SOLN
INTRAMUSCULAR | Status: DC | PRN
  Administered 2017-02-28: 15 mL via PERINEURAL

## 2017-02-28 MED ORDER — OXYCODONE-ACETAMINOPHEN 5-325 MG PO TABS: 1.0000 | ORAL_TABLET | ORAL | 0 refills | Status: DC | PRN

## 2017-02-28 MED ORDER — MIDAZOLAM HCL 2 MG/2ML IJ SOLN
INTRAMUSCULAR | Status: AC
  Filled 2017-02-28: qty 2

## 2017-02-28 MED ORDER — HYDROMORPHONE HCL 1 MG/ML IJ SOLN
0.5000 mg | INTRAMUSCULAR | Status: DC | PRN
  Administered 2017-02-28: 1 mg via INTRAVENOUS
  Administered 2017-03-01: 0.5 mg via INTRAVENOUS
  Filled 2017-02-28 (×2): qty 1

## 2017-02-28 MED ORDER — ONDANSETRON HCL 4 MG/2ML IJ SOLN: 4.0000 mg | Freq: Four times a day (QID) | INTRAMUSCULAR | Status: DC | PRN

## 2017-02-28 MED ORDER — AMLODIPINE BESYLATE 2.5 MG PO TABS
2.5000 mg | ORAL_TABLET | Freq: Every day | ORAL | Status: DC
  Administered 2017-03-01 – 2017-03-02 (×2): 2.5 mg via ORAL
  Filled 2017-02-28 (×2): qty 1

## 2017-02-28 MED ORDER — DOCUSATE SODIUM 100 MG PO CAPS
100.0000 mg | ORAL_CAPSULE | Freq: Two times a day (BID) | ORAL | Status: DC
  Administered 2017-02-28 – 2017-03-02 (×4): 100 mg via ORAL
  Filled 2017-02-28 (×4): qty 1

## 2017-02-28 MED ORDER — OXYBUTYNIN CHLORIDE 5 MG PO TABS
5.0000 mg | ORAL_TABLET | Freq: Every day | ORAL | Status: DC
  Administered 2017-03-01 – 2017-03-02 (×2): 5 mg via ORAL
  Filled 2017-02-28 (×2): qty 1

## 2017-02-28 MED ORDER — LACTATED RINGERS IV SOLN
INTRAVENOUS | Status: DC
  Administered 2017-02-28 (×3): via INTRAVENOUS

## 2017-02-28 MED ORDER — EPHEDRINE SULFATE 50 MG/ML IJ SOLN
INTRAMUSCULAR | Status: DC | PRN
  Administered 2017-02-28: 10 mg via INTRAVENOUS
  Administered 2017-02-28: 5 mg via INTRAVENOUS

## 2017-02-28 MED ORDER — FLUOXETINE HCL 20 MG PO CAPS
20.0000 mg | ORAL_CAPSULE | Freq: Every day | ORAL | Status: DC
  Administered 2017-03-01 – 2017-03-02 (×2): 20 mg via ORAL
  Filled 2017-02-28 (×2): qty 1

## 2017-02-28 MED ORDER — DEXAMETHASONE SODIUM PHOSPHATE 10 MG/ML IJ SOLN
INTRAMUSCULAR | Status: AC
  Filled 2017-02-28: qty 1

## 2017-02-28 MED ORDER — METHOCARBAMOL 1000 MG/10ML IJ SOLN
500.0000 mg | Freq: Four times a day (QID) | INTRAVENOUS | Status: DC | PRN
  Filled 2017-02-28: qty 5

## 2017-02-28 MED ORDER — ASPIRIN EC 325 MG PO TBEC
325.0000 mg | DELAYED_RELEASE_TABLET | Freq: Two times a day (BID) | ORAL | Status: DC
  Administered 2017-02-28 – 2017-03-02 (×4): 325 mg via ORAL
  Filled 2017-02-28 (×4): qty 1

## 2017-02-28 SURGICAL SUPPLY — 61 items
BANDAGE ESMARK 6X9 LF (GAUZE/BANDAGES/DRESSINGS) ×1 IMPLANT
BENZOIN TINCTURE PRP APPL 2/3 (GAUZE/BANDAGES/DRESSINGS) ×2 IMPLANT
BLADE SAGITTAL 25.0X1.19X90 (BLADE) ×2 IMPLANT
BLADE SAW SAG 90X13X1.27 (BLADE) ×2 IMPLANT
BLADE SURG 10 STRL SS (BLADE) ×2 IMPLANT
BNDG ESMARK 6X9 LF (GAUZE/BANDAGES/DRESSINGS) ×2
BOWL SMART MIX CTS (DISPOSABLE) ×2 IMPLANT
CAPT KNEE TOTAL 3 ATTUNE ×2 IMPLANT
CEMENT HV SMART SET (Cement) ×4 IMPLANT
CLSR STERI-STRIP ANTIMIC 1/2X4 (GAUZE/BANDAGES/DRESSINGS) ×2 IMPLANT
COVER SURGICAL LIGHT HANDLE (MISCELLANEOUS) ×2 IMPLANT
CUFF TOURNIQUET SINGLE 34IN LL (TOURNIQUET CUFF) ×2 IMPLANT
CUFF TOURNIQUET SINGLE 44IN (TOURNIQUET CUFF) IMPLANT
DRAPE EXTREMITY T 121X128X90 (DRAPE) ×2 IMPLANT
DRAPE U-SHAPE 47X51 STRL (DRAPES) ×2 IMPLANT
DRSG AQUACEL AG ADV 3.5X10 (GAUZE/BANDAGES/DRESSINGS) ×2 IMPLANT
DRSG PAD ABDOMINAL 8X10 ST (GAUZE/BANDAGES/DRESSINGS) ×2 IMPLANT
DURAPREP 26ML APPLICATOR (WOUND CARE) ×2 IMPLANT
ELECT CAUTERY BLADE 6.4 (BLADE) ×2 IMPLANT
ELECT REM PT RETURN 9FT ADLT (ELECTROSURGICAL) ×2
ELECTRODE REM PT RTRN 9FT ADLT (ELECTROSURGICAL) ×1 IMPLANT
EVACUATOR 1/8 PVC DRAIN (DRAIN) IMPLANT
FACESHIELD WRAPAROUND (MASK) ×2 IMPLANT
GAUZE SPONGE 4X4 12PLY STRL (GAUZE/BANDAGES/DRESSINGS) ×2 IMPLANT
GAUZE SPONGE 4X4 12PLY STRL LF (GAUZE/BANDAGES/DRESSINGS) ×2 IMPLANT
GLOVE BIOGEL PI IND STRL 8 (GLOVE) ×2 IMPLANT
GLOVE BIOGEL PI INDICATOR 8 (GLOVE) ×2
GLOVE ECLIPSE 7.5 STRL STRAW (GLOVE) ×4 IMPLANT
GOWN STRL REUS W/ TWL LRG LVL3 (GOWN DISPOSABLE) ×1 IMPLANT
GOWN STRL REUS W/ TWL XL LVL3 (GOWN DISPOSABLE) ×2 IMPLANT
GOWN STRL REUS W/TWL LRG LVL3 (GOWN DISPOSABLE) ×1
GOWN STRL REUS W/TWL XL LVL3 (GOWN DISPOSABLE) ×2
HANDPIECE INTERPULSE COAX TIP (DISPOSABLE) ×1
HOOD PEEL AWAY FACE SHEILD DIS (HOOD) ×4 IMPLANT
IMMOBILIZER KNEE 22 (SOFTGOODS) ×2 IMPLANT
IMMOBILIZER KNEE 22 UNIV (SOFTGOODS) ×2 IMPLANT
KIT BASIN OR (CUSTOM PROCEDURE TRAY) ×2 IMPLANT
KIT ROOM TURNOVER OR (KITS) ×2 IMPLANT
MANIFOLD NEPTUNE II (INSTRUMENTS) ×2 IMPLANT
NEEDLE 22X1 1/2 (OR ONLY) (NEEDLE) ×2 IMPLANT
NS IRRIG 1000ML POUR BTL (IV SOLUTION) ×2 IMPLANT
PACK TOTAL JOINT (CUSTOM PROCEDURE TRAY) ×2 IMPLANT
PAD ARMBOARD 7.5X6 YLW CONV (MISCELLANEOUS) ×4 IMPLANT
PAD CAST 4YDX4 CTTN HI CHSV (CAST SUPPLIES) ×1 IMPLANT
PADDING CAST COTTON 4X4 STRL (CAST SUPPLIES) ×1
SET HNDPC FAN SPRY TIP SCT (DISPOSABLE) ×1 IMPLANT
STAPLER VISISTAT 35W (STAPLE) IMPLANT
STRIP CLOSURE SKIN 1/2X4 (GAUZE/BANDAGES/DRESSINGS) IMPLANT
SUCTION FRAZIER HANDLE 10FR (MISCELLANEOUS) ×1
SUCTION TUBE FRAZIER 10FR DISP (MISCELLANEOUS) ×1 IMPLANT
SUT MNCRL AB 3-0 PS2 18 (SUTURE) IMPLANT
SUT VIC AB 0 CTB1 27 (SUTURE) ×4 IMPLANT
SUT VIC AB 1 CT1 27 (SUTURE) ×2
SUT VIC AB 1 CT1 27XBRD ANBCTR (SUTURE) ×2 IMPLANT
SUT VIC AB 2-0 CTB1 (SUTURE) ×4 IMPLANT
SYR 50ML LL SCALE MARK (SYRINGE) ×2 IMPLANT
TOWEL OR 17X24 6PK STRL BLUE (TOWEL DISPOSABLE) ×2 IMPLANT
TOWEL OR 17X26 10 PK STRL BLUE (TOWEL DISPOSABLE) ×2 IMPLANT
TRAY CATH 16FR W/PLASTIC CATH (SET/KITS/TRAYS/PACK) IMPLANT
TRAY FOLEY W/METER SILVER 16FR (SET/KITS/TRAYS/PACK) IMPLANT
WRAP KNEE MAXI GEL POST OP (GAUZE/BANDAGES/DRESSINGS) ×2 IMPLANT

## 2017-02-28 NOTE — Transfer of Care (Signed)
Immediate Anesthesia Transfer of Care Note  Patient: Todd Parsons  Procedure(s) Performed: Procedure(s): RIGHT TOTAL KNEE ARTHROPLASTY (Right)  Patient Location: PACU  Anesthesia Type:MAC and Spinal  Level of Consciousness: awake, alert , oriented and patient cooperative  Airway & Oxygen Therapy: Patient Spontanous Breathing and Patient connected to nasal cannula oxygen  Post-op Assessment: Report given to RN, Post -op Vital signs reviewed and stable and Patient moving all extremities X 4  Post vital signs: Reviewed and stable  Last Vitals:  Vitals:   02/28/17 1205 02/28/17 1422  BP: 131/73   Pulse: (!) 48   Resp: 11   Temp:  (P) 36.7 C    Last Pain:  Vitals:   02/28/17 1033  TempSrc: Oral         Complications: No apparent anesthesia complications

## 2017-02-28 NOTE — Brief Op Note (Signed)
02/28/2017  1:51 PM  PATIENT:  Todd Parsons  71 y.o. male  PRE-OPERATIVE DIAGNOSIS:  OSTEOARTHRITIS RIGHT KNEE  POST-OPERATIVE DIAGNOSIS:  OSTEOARTHRITIS RIGHT KNEE  PROCEDURE:  Procedure(s): RIGHT TOTAL KNEE ARTHROPLASTY (Right)  SURGEON:  Surgeon(s) and Role:    Dorna Leitz, MD - Primary  PHYSICIAN ASSISTANT:   ASSISTANTS: bethune   ANESTHESIA:   spinal  EBL:  Total I/O In: 1000 [I.V.:1000] Out: 100 [Urine:100]  BLOOD ADMINISTERED:none  DRAINS: none   LOCAL MEDICATIONS USED:  MARCAINE    and OTHER experel  SPECIMEN:  No Specimen  DISPOSITION OF SPECIMEN:  N/A  COUNTS:  YES  TOURNIQUET:   Total Tourniquet Time Documented: Thigh (Right) - 61 minutes Total: Thigh (Right) - 61 minutes   DICTATION: .Other Dictation: Dictation Number [id number]  PLAN OF CARE: Admit to inpatient   PATIENT DISPOSITION:  PACU - hemodynamically stable.   Delay start of Pharmacological VTE agent (>24hrs) due to surgical blood loss or risk of bleeding: no

## 2017-02-28 NOTE — Progress Notes (Signed)
Orthopedic Tech Progress Note Patient Details:  Todd Parsons Nov 01, 1945 [id number]  CPM Right Knee CPM Right Knee: On Right Knee Flexion (Degrees): 90 Right Knee Extension (Degrees): 0 Additional Comments: applied cpm to pt right knee at 0-90 degrees.  pt tolerated application very well.  provided bone foam zero degree at bedside.      Kristopher Oppenheim 02/28/2017, 2:44 PM

## 2017-02-28 NOTE — Anesthesia Procedure Notes (Signed)
Spinal  Patient location during procedure: OR Start time: 02/28/2017 12:15 PM End time: 02/28/2017 12:18 PM Staffing Anesthesiologist: Roderic Palau Performed: anesthesiologist  Preanesthetic Checklist Completed: patient identified, surgical consent, pre-op evaluation, timeout performed, IV checked, risks and benefits discussed and monitors and equipment checked Spinal Block Patient position: sitting Prep: DuraPrep Patient monitoring: cardiac monitor, continuous pulse ox and blood pressure Approach: midline Location: L3-4 Injection technique: single-shot Needle Needle type: Pencan  Needle gauge: 24 G Needle length: 9 cm Assessment Sensory level: T8 Additional Notes Functioning IV was confirmed and monitors were applied. Sterile prep and drape, including hand hygiene and sterile gloves were used. The patient was positioned and the spine was prepped. The skin was anesthetized with lidocaine.  Free flow of clear CSF was obtained prior to injecting local anesthetic into the CSF.  The spinal needle aspirated freely following injection.  The needle was carefully withdrawn.  The patient tolerated the procedure well.

## 2017-02-28 NOTE — Anesthesia Preprocedure Evaluation (Addendum)
Anesthesia Evaluation  Patient identified by MRN, date of birth, ID band Patient awake    Reviewed: Allergy & Precautions, NPO status , Patient's Chart, lab work & pertinent test results, reviewed documented beta blocker date and time   History of Anesthesia Complications Negative for: history of anesthetic complications  Airway Mallampati: II  TM Distance: >3 FB Neck ROM: Full    Dental  (+) Teeth Intact, Dental Advisory Given   Pulmonary sleep apnea and Continuous Positive Airway Pressure Ventilation , former smoker,    breath sounds clear to auscultation       Cardiovascular hypertension, Pt. on medications and Pt. on home beta blockers + CAD, + CABG and + Peripheral Vascular Disease  + dysrhythmias + Valvular Problems/Murmurs  Rhythm:Regular     Neuro/Psych    GI/Hepatic   Endo/Other    Renal/GU      Musculoskeletal  (+) Arthritis , Osteoarthritis,    Abdominal   Peds  Hematology   Anesthesia Other Findings S/P AVR, Aortic root replacement and CABG X3,  2016 A- flutter ablation - 2017  Echo 09/29/2015  EF -40 - 45%    Reproductive/Obstetrics                           Anesthesia Physical Anesthesia Plan  ASA: III  Anesthesia Plan: MAC, Spinal and Regional   Post-op Pain Management:  Regional for Post-op pain   Induction:   PONV Risk Score and Plan: 1 and Treatment may vary due to age or medical condition  Airway Management Planned: Nasal Cannula and Simple Face Mask  Additional Equipment: None  Intra-op Plan:   Post-operative Plan:   Informed Consent: I have reviewed the patients History and Physical, chart, labs and discussed the procedure including the risks, benefits and alternatives for the proposed anesthesia with the patient or authorized representative who has indicated his/her understanding and acceptance.   Dental advisory given  Plan Discussed with: CRNA,  Anesthesiologist and Surgeon  Anesthesia Plan Comments:        Anesthesia Quick Evaluation

## 2017-02-28 NOTE — Discharge Instructions (Signed)

## 2017-02-28 NOTE — Anesthesia Preprocedure Evaluation (Signed)
Anesthesia Evaluation  Patient identified by MRN, date of birth, ID band Patient awake    Reviewed: Allergy & Precautions, NPO status , Patient's Chart, lab work & pertinent test results, reviewed documented beta blocker date and time   History of Anesthesia Complications Negative for: history of anesthetic complications  Airway Mallampati: II  TM Distance: >3 FB Neck ROM: Full    Dental  (+) Caps, Teeth Intact   Pulmonary neg shortness of breath, neg sleep apnea, neg COPD, neg recent URI, former smoker,    breath sounds clear to auscultation       Cardiovascular hypertension, Pt. on medications and Pt. on home beta blockers + CAD, + CABG and + Peripheral Vascular Disease  + dysrhythmias + Valvular Problems/Murmurs AS and AI  Rhythm:Regular Rate:Bradycardia     Neuro/Psych neg Seizures  Neuromuscular disease negative psych ROS   GI/Hepatic negative GI ROS, Neg liver ROS,   Endo/Other  diabetes  Renal/GU Renal InsufficiencyRenal disease     Musculoskeletal  (+) Arthritis ,   Abdominal   Peds  Hematology   Anesthesia Other Findings S/P AVR/CABG for bicuspid AV and CAD in 07/2016, denies CP/SOB, held coumadin > 10 days ago, h/o afib s/p ablation sinus today, EF 40-45% on recent tte  Reproductive/Obstetrics                             Anesthesia Physical Anesthesia Plan  ASA: III  Anesthesia Plan: MAC, Regional and Spinal   Post-op Pain Management:    Induction:   PONV Risk Score and Plan: 1 and Ondansetron and Dexamethasone  Airway Management Planned: Nasal Cannula and Simple Face Mask  Additional Equipment: None  Intra-op Plan:   Post-operative Plan:   Informed Consent: I have reviewed the patients History and Physical, chart, labs and discussed the procedure including the risks, benefits and alternatives for the proposed anesthesia with the patient or authorized representative  who has indicated his/her understanding and acceptance.   Dental advisory given  Plan Discussed with: CRNA and Surgeon  Anesthesia Plan Comments:         Anesthesia Quick Evaluation

## 2017-02-28 NOTE — H&P (Addendum)
TOTAL KNEE ADMISSION H&P  Patient is being admitted for right total knee arthroplasty.  Subjective:  Chief Complaint:right knee pain.  HPI: Todd Parsons, 71 y.o. male, has a history of pain and functional disability in the right knee due to arthritis and has failed non-surgical conservative treatments for greater than 12 weeks to includeNSAID's and/or analgesics, corticosteriod injections, viscosupplementation injections, use of assistive devices, weight reduction as appropriate and activity modification.  Onset of symptoms was gradual, starting 5 years ago with gradually worsening course since that time. The patient noted no past surgery on the right knee(s).  Patient currently rates pain in the right knee(s) at 9 out of 10 with activity. Patient has night pain, worsening of pain with activity and weight bearing, pain that interferes with activities of daily living, pain with passive range of motion, crepitus and joint swelling.  Patient has evidence of subchondral sclerosis and joint space narrowing by imaging studies. This patient has had failure of all reasonable conservative care. There is no active infection.the patient has cardiac and pulmonary issues along with sleep apnea and I believe he will need to stay 2 midnights.  Patient Active Problem List   Diagnosis Date Noted  . Wears hearing aid 06/02/2016  . OAB (overactive bladder) 04/02/2016  . Primary osteoarthritis of both knees 02/10/2016  . Pulmonary fibrosis, unspecified ([place]) 12/22/2015  . Long term current use of anticoagulant therapy 10/17/2015  . H/O aortic valve replacement 09/15/2015  . Atrial flutter ([place]) 09/15/2015  . IFG (impaired fasting glucose) 01/30/2015  . Restrictive airway disease 08/09/2014  . Hx of arthroscopy of knee 06/08/2011  . Essential hypertension, benign 05/05/2011  . Hyperlipidemia LDL goal <100 05/05/2011  . OSA (obstructive sleep apnea) 05/05/2011  . 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  Congenital insufficiency of aortic valve 05/05/2011  . Bicuspid aortic valve 05/05/2011  . Vitamin D deficiency 05/05/2011   Past Medical History:  Diagnosis Date  . Arthritis    osteoarthritis  . Bicuspid aortic valve   . Coronary artery disease   . Dysrhythmia    atrial flutter  . Echocardiogram with ECG monitoring 01/12/2010   60-65% EF  . Hyperlipidemia   . Hypertension   . Pre-diabetes   . Sleep apnea     Past Surgical History:  Procedure Laterality Date  . A-FLUTTER ABLATION  2017  . ACROMIO-CLAVICULAR JOINT REPAIR Right 1980s      . apnea device implant     then removed.    Marland Kitchen CARDIAC CATHETERIZATION  07/2015  . CARDIAC VALVE REPLACEMENT  08/05/2015   Aortic valve  . CORONARY ARTERY BYPASS GRAFT  08/05/2015  . left knee arthoscopy    . NOSE SURGERY  late 50's   to fix a broken nose  . TONSILECTOMY, ADENOIDECTOMY, BILATERAL MYRINGOTOMY AND TUBES      Prescriptions Prior to Admission  Medication Sig Dispense Refill Last Dose  . amLODipine (NORVASC) 2.5 MG tablet Take 1 tablet (2.5 mg total) by mouth daily. 90 tablet 3 02/28/2017 at Unknown time  . aspirin 81 MG tablet Take 162 mg by mouth daily. Two tablets by mouth daily   Past Week at Unknown time  . atorvastatin (LIPITOR) 40 MG tablet Take 1 tablet (40 mg total) by mouth at bedtime. 90 tablet 3 Past Week at Unknown time  . fenofibrate 160 MG tablet Take 1 tablet (160 mg total) by mouth daily. 30 tablet 0 02/28/2017 at Unknown time  . FLUoxetine (PROZAC) 20 MG capsule  Take 1 capsule (20 mg total) by mouth daily. 30 capsule 0 02/28/2017 at Unknown time  . fluticasone (FLONASE) 50 MCG/ACT nasal spray Place 2 sprays into both nostrils daily. (Patient taking differently: Place 2 sprays into both nostrils daily as needed for allergies or rhinitis. ) 48 g 3 02/27/2017 at Unknown time  . metFORMIN (GLUCOPHAGE-XR) 500 MG 24 hr tablet Take 1 tablet (500 mg total) by mouth daily with breakfast. 30 tablet 0 Past Week at Unknown time   . metoprolol (LOPRESSOR) 50 MG tablet Take 1 tablet (50 mg total) by mouth 2 (two) times daily. 60 tablet 0 02/28/2017 at 0800  . Multiple Vitamin (MULTIVITAMIN WITH MINERALS) TABS tablet Take 1 tablet by mouth daily.   Past Week at Unknown time  . naproxen sodium (ANAPROX) 220 MG tablet Take 220 mg by mouth daily as needed (PAIN).   Past Week at Unknown time  . oxybutynin (DITROPAN) 5 MG tablet Take 5 mg by mouth daily.   02/28/2017 at Unknown time  . terazosin (HYTRIN) 2 MG capsule TAKE 1 CAPSULE AT BEDTIME 90 capsule 2 02/27/2017 at Unknown time  . UNABLE TO FIND Take 2 capsules by mouth daily. Cocoa Capsules   Past Week at Unknown time  . Alcohol Swabs (B-D SINGLE USE SWABS REGULAR) PADS For use when testing glucose 100 each prn Taking  . AMBULATORY NON FORMULARY MEDICATION Medication Name: glucometer, meter and strips to test 2 times per week. Dx: Diabetes Type 2. 1 vial 0 Taking  . Blood Glucose Calibration (ACCU-CHEK AVIVA) SOLN For use of calibration of meter 1 each prn Taking  . warfarin (COUMADIN) 5 MG tablet Take 1 tablet (5 mg total) by mouth daily. (Patient not taking: Reported on 02/17/2017) 90 tablet 3 Not Taking at Unknown time   Allergies  Allergen Reactions  . Simvastatin Other (See Comments)    Myalgias    Social History  Substance Use Topics  . Smoking status: Former Smoker    Quit date: 04/24/1978  . Smokeless tobacco: Never Used  . Alcohol use 1.2 oz/week    2 Standard drinks or equivalent per week     Comment: socially    Family History  Problem Relation Age of Onset  . Heart disease Father 92       bypass surgery  . Stroke Mother 57  . Cancer Mother        upper palate     ROS ROS: I have reviewed the patient's review of systems thoroughly and there are no positive responses as relates to the HPI. Objective:  Physical Exam  Vital signs in last 24 hours: Temp:  [97.8 F (36.6 C)] 97.8 F (36.6 C) (06/25 1033) Pulse Rate:  [48] 48 (06/25 1033) Resp:   [20] 20 (06/25 1033) BP: (150)/(91) 150/91 (06/25 1033) SpO2:  [98 %] 98 % (06/25 1033) Weight:  [89.4 kg (197 lb 3 oz)] 89.4 kg (197 lb 3 oz) (06/25 1033) Well-developed well-nourished patient in no acute distress. Alert and oriented x3 HEENT:within normal limits Cardiac: Regular rate and rhythm Pulmonary: Lungs clear to auscultation Abdomen: Soft and nontender.  Normal active bowel sounds  Musculoskeletal:Right knee: Limited range of motion.  Trace effusion.  No instability.  Pain and grinding through range of motion. Labs: Recent Results (from the past 2160 hour(s))  POCT glycosylated hemoglobin (Hb A1C)     Status: None   Collection Time: 01/27/17 10:23 AM  Result Value Ref Range   Hemoglobin A1C 6.4   POCT  INR     Status: None   Collection Time: 01/27/17 10:23 AM  Result Value Ref Range   INR 1.8   POCT INR     Status: None   Collection Time: 02/10/17 11:09 AM  Result Value Ref Range   INR 2.6   Urinalysis, Routine w reflex microscopic     Status: None   Collection Time: 02/18/17 11:59 AM  Result Value Ref Range   Color, Urine YELLOW YELLOW   APPearance CLEAR CLEAR   Specific Gravity, Urine 1.012 1.005 - 1.030   pH 5.0 5.0 - 8.0   Glucose, UA NEGATIVE NEGATIVE mg/dL   Hgb urine dipstick NEGATIVE NEGATIVE   Bilirubin Urine NEGATIVE NEGATIVE   Ketones, ur NEGATIVE NEGATIVE mg/dL   Protein, ur NEGATIVE NEGATIVE mg/dL   Nitrite NEGATIVE NEGATIVE   Leukocytes, UA NEGATIVE NEGATIVE  APTT     Status: None   Collection Time: 02/18/17 12:00 PM  Result Value Ref Range   aPTT 29 24 - 36 seconds  CBC WITH DIFFERENTIAL     Status: None   Collection Time: 02/18/17 12:00 PM  Result Value Ref Range   WBC 8.8 4.0 - 10.5 K/uL   RBC 5.33 4.22 - 5.81 MIL/uL   Hemoglobin 15.5 13.0 - 17.0 g/dL   HCT 48.2 39.0 - 52.0 %   MCV 90.4 78.0 - 100.0 fL   MCH 29.1 26.0 - 34.0 pg   MCHC 32.2 30.0 - 36.0 g/dL   RDW 13.9 11.5 - 15.5 %   Platelets 199 150 - 400 K/uL   Neutrophils Relative %  51 %   Neutro Abs 4.5 1.7 - 7.7 K/uL   Lymphocytes Relative 35 %   Lymphs Abs 3.1 0.7 - 4.0 K/uL   Monocytes Relative 9 %   Monocytes Absolute 0.8 0.1 - 1.0 K/uL   Eosinophils Relative 5 %   Eosinophils Absolute 0.4 0.0 - 0.7 K/uL   Basophils Relative 0 %   Basophils Absolute 0.0 0.0 - 0.1 K/uL  Comprehensive metabolic panel     Status: Abnormal   Collection Time: 02/18/17 12:00 PM  Result Value Ref Range   Sodium 137 135 - 145 mmol/L   Potassium 4.0 3.5 - 5.1 mmol/L   Chloride 103 101 - 111 mmol/L   CO2 26 22 - 32 mmol/L   Glucose, Bld 102 (H) 65 - 99 mg/dL   BUN 16 6 - 20 mg/dL   Creatinine, Ser 1.12 0.61 - 1.24 mg/dL   Calcium 9.7 8.9 - 10.3 mg/dL   Total Protein 7.0 6.5 - 8.1 g/dL   Albumin 4.3 3.5 - 5.0 g/dL   AST 33 15 - 41 U/L   ALT 29 17 - 63 U/L   Alkaline Phosphatase 59 38 - 126 U/L   Total Bilirubin 0.7 0.3 - 1.2 mg/dL   GFR calc non Af Amer >60 >60 mL/min   GFR calc Af Amer >60 >60 mL/min    Comment: (NOTE) The eGFR has been calculated using the CKD EPI equation. This calculation has not been validated in all clinical situations. eGFR's persistently <60 mL/min signify possible Chronic Kidney Disease.    Anion gap 8 5 - 15  Protime-INR     Status: None   Collection Time: 02/18/17 12:00 PM  Result Value Ref Range   Prothrombin Time 14.3 11.4 - 15.2 seconds   INR 1.10   Surgical pcr screen     Status: None   Collection Time: 02/18/17 12:09 PM  Result  Value Ref Range   MRSA, PCR NEGATIVE NEGATIVE   Staphylococcus aureus NEGATIVE NEGATIVE    Comment:        The Xpert SA Assay (FDA approved for NASAL specimens in patients over 36 years of age), is one component of a comprehensive surveillance program.  Test performance has been validated by [facility] for patients greater than or equal to 17 year old. It is not intended to diagnose infection nor to guide or monitor treatment.   Type and screen Order type and screen if day of surgery is less than 15  days from draw of preadmission visit or order morning of surgery if day of surgery is greater than 6 days from preadmission visit.     Status: None   Collection Time: 02/18/17 12:13 PM  Result Value Ref Range   ABO/RH(D) O POS    Antibody Screen NEG    Sample Expiration 03/04/2017    Extend sample reason NO TRANSFUSIONS OR PREGNANCY IN THE PAST 3 MONTHS   ABO/Rh     Status: None   Collection Time: 02/18/17 12:13 PM  Result Value Ref Range   ABO/RH(D) O POS   Glucose, capillary     Status: Abnormal   Collection Time: 02/28/17 10:30 AM  Result Value Ref Range   Glucose-Capillary 101 (H) 65 - 99 mg/dL    Estimated body mass index is 30.88 kg/m as calculated from the following:   Height as of this encounter: 5' 7" (1.702 m).   Weight as of this encounter: 89.4 kg (197 lb 3 oz).   Imaging Review Plain radiographs demonstrate severe degenerative joint disease of the right knee(s). The overall alignment ismild varus. The bone quality appears to be fair for age and reported activity level.  Assessment/Plan:  End stage arthritis, right knee   The patient history, physical examination, clinical judgment of the provider and imaging studies are consistent with end stage degenerative joint disease of the right knee(s) and total knee arthroplasty is deemed medically necessary. The treatment options including medical management, injection therapy arthroscopy and arthroplasty were discussed at length. The risks and benefits of total knee arthroplasty were presented and reviewed. The risks due to aseptic loosening, infection, stiffness, patella tracking problems, thromboembolic complications and other imponderables were discussed. The patient acknowledged the explanation, agreed to proceed with the plan and consent was signed. Patient is being admitted for inpatient treatment for surgery, pain control, PT, OT, prophylactic antibiotics, VTE prophylaxis, progressive ambulation and ADL's and discharge  planning. The patient is planning to be discharged home with home health services

## 2017-02-28 NOTE — Anesthesia Procedure Notes (Signed)
Anesthesia Regional Block: Adductor canal block   Pre-Anesthetic Checklist: ,, timeout performed, Correct Patient, Correct Site, Correct Laterality, Correct Procedure, Correct Position, site marked, Risks and benefits discussed,  Surgical consent,  Pre-op evaluation,  At surgeon's request and post-op pain management  Laterality: Lower and Right  Prep: chloraprep       Needles:  Injection technique: Single-shot  Needle Type: Echogenic Stimulator Needle          Additional Needles:   Procedures: ultrasound guided,,,,,,,,  Narrative:  Start time: 02/28/2017 12:02 PM End time: 02/28/2017 12:09 PM Injection made incrementally with aspirations every 5 mL.  Performed by: Personally  Anesthesiologist: Bonnita Newby  Additional Notes: H+P and labs reviewed, risks and benefits discussed with patient, procedure tolerated well without complications

## 2017-02-28 NOTE — Op Note (Signed)
NAMEALEKSANDR, PELLOW                 ACCOUNT NO.:  [id number]  MEDICAL RECORD NO.:  [id number]  LOCATION:                               FACILITY:  [facility]  PHYSICIAN:  Alta Corning, M.D.   DATE OF BIRTH:  1945-11-19  DATE OF PROCEDURE:  02/28/2017 DATE OF DISCHARGE:                              OPERATIVE REPORT   PREOPERATIVE DIAGNOSIS:  End-stage degenerative joint disease, right knee with bone-on-bone change.  POSTOPERATIVE DIAGNOSIS:  End-stage degenerative joint disease, right knee with bone-on-bone change.  PROCEDURES PERFORMED:  Right total knee replacement with Attune system size 7 femur, size 8 tibia, size 38 all polyethylene patella, and a 7 mm bridging bearing.  SURGEON:  Alta Corning, M.D.  ASSISTANT:  Gary Fleet, P.A.  ANESTHESIA:  General.  BRIEF HISTORY:  Mr. Mccadden is a 71 year old male with a long history of complaints of right knee pain.  He had been treated conservatively for prolonged period of time.  After failure of all conservative care, he was taken to the operating room for right total knee replacement. Preoperative imaging showed bone-on-bone change.  The patient was having night pain and light activity pain and he failed conservative care including injection therapy, activity modification, and physical therapy.  DESCRIPTION OF PROCEDURE:  The patient was taken to the operating room. After adequate anesthesia was obtained with spinal anesthetic, the patient was placed supine on the operating table.  The right leg was prepped and draped in usual sterile fashion.  Following this, the leg was exsanguinated.  Blood pressure tourniquet was inflated to 300 mmHg. Following this, a midline incision was made in subcutaneous tissue and dissected down to the level of the sensor mechanism and a medial parapatellar arthrotomy was undertaken.  Once this was completed, attention was turned towards the leg where anterior and posterior cruciates were removed,  retropatellar fat pad, synovium on the anterior aspect of the femur, and medial and lateral meniscus.  Once this was done, attention was turned to the femur where an intramedullary pilot hole was drilled and the guide was placed down the central pilot hole with a 3 degree valgus inclination and 9 mm of distal bone was resected. Following this, attention was turned towards sizing the femur, sized it to a 7.  Anterior and posterior cuts were made, chamfers and box. Attention was turned to the tibia, sized to an 8.  It was drilled and keeled and afterwards cut perpendicular to its long axis.  At this point, trial components were put into place along with a 7 mm bridging bearing, and attention turned to the patella.  It was cut down to a level of 13 mm and a 38  paddle was chosen and the lugs were drilled for the all-polyethylene plastic patella.  Once this was completed, attention was turned towards the removal of all trial components was undertaken.  The knee was copiously and thoroughly lavaged with pulsatile lavage, irrigation and suctioned dry.  The final components were then cemented into place.  Size 8 tibia, size 7 femur.  A 7 mm bridging bearing trial was placed and a 38 all-poly patella was placed and held with a clamp.  All excess bone cement was removed.  Once this was done, attention was turned towards letting the tourniquet down.  All bleeding was controlled with electrocautery, and 20 mL of Exparel was instilled throughout the synovial reflection for postoperative pain control.  At this point, the wound was irrigated thoroughly and the final poly was placed.  Knee put through a range of motion.  Excellent stability and range of motion were achieved.  At this point, the medial parapatellar arthrotomy was closed with 1 Vicryl running, the skin with 0 Vicryl and 2-0 Vicryl, and 3-0 Monocryl subcuticular.  A sterile compressive dressing was applied as well as a Ted stocking and  the patient was taken to the recovery room and was noted to be in satisfactory condition.  Estimated blood loss for procedure being minimal.     Alta Corning, M.D.   ______________________________ Alta Corning, M.D.    Corliss Skains  D:  02/28/2017  T:  02/28/2017  Job:  [id number]  cc:   Alta Corning, M.D.

## 2017-02-28 NOTE — Op Note (Deleted)
  The note originally documented on this encounter has been moved the the encounter in which it belongs.  

## 2017-03-01 ENCOUNTER — Encounter (HOSPITAL_COMMUNITY): Payer: Self-pay | Admitting: Orthopedic Surgery

## 2017-03-01 LAB — CBC
HEMATOCRIT: 37 % — AB (ref 39.0–52.0)
Hemoglobin: 12.2 g/dL — ABNORMAL LOW (ref 13.0–17.0)
MCH: 29.2 pg (ref 26.0–34.0)
MCHC: 33 g/dL (ref 30.0–36.0)
MCV: 88.5 fL (ref 78.0–100.0)
Platelets: 148 10*3/uL — ABNORMAL LOW (ref 150–400)
RBC: 4.18 MIL/uL — ABNORMAL LOW (ref 4.22–5.81)
RDW: 13.7 % (ref 11.5–15.5)
WBC: 16.2 10*3/uL — AB (ref 4.0–10.5)

## 2017-03-01 LAB — GLUCOSE, CAPILLARY
GLUCOSE-CAPILLARY: 202 mg/dL — AB (ref 65–99)
GLUCOSE-CAPILLARY: 185 mg/dL — AB (ref 65–99)
GLUCOSE-CAPILLARY: 154 mg/dL — AB (ref 65–99)
GLUCOSE-CAPILLARY: 207 mg/dL — AB (ref 65–99)

## 2017-03-01 LAB — BASIC METABOLIC PANEL
ANION GAP: 8 (ref 5–15)
BUN: 17 mg/dL (ref 6–20)
CALCIUM: 9 mg/dL (ref 8.9–10.3)
CO2: 26 mmol/L (ref 22–32)
Chloride: 101 mmol/L (ref 101–111)
Creatinine, Ser: 1.21 mg/dL (ref 0.61–1.24)
GFR calc Af Amer: >60 mL/min (ref >60)
GFR calc non Af Amer: 58 mL/min — ABNORMAL LOW (ref >60)
GLUCOSE: 200 mg/dL — AB (ref 65–99)
Potassium: 4.2 mmol/L (ref 3.5–5.1)
Sodium: 135 mmol/L (ref 135–145)

## 2017-03-01 NOTE — Progress Notes (Signed)
Physical Therapy Treatment Patient Details Name: Todd Parsons MRN: [id number] DOB: [birth year] Today's Date: 03/01/2017    History of Present Illness 71 yo admitted for Rt TKA. PMHx: arthritis, CABG, CAD, pulmonary fibrosis    PT Comments    Pt remains pleasant and moving well. Pt with good ROM and strength with excellent increased gait distance this session. Pt educated for HEP and in bone foam end of session. Pt safe for return home, will continue to follow acutely.     Follow Up Recommendations  DC plan and follow up therapy as arranged by surgeon     Equipment Recommendations  Rolling walker with 5" wheels;3in1 (PT)    Recommendations for Other Services       Precautions / Restrictions Precautions Precautions: Knee;Fall Restrictions RLE Weight Bearing: Weight bearing as tolerated    Mobility  Bed Mobility Overal bed mobility: Modified Independent                Transfers Overall transfer level: Modified independent                  Ambulation/Gait Ambulation/Gait assistance: Supervision Ambulation Distance (Feet): 500 Feet Assistive device: Rolling walker (2 wheeled) Gait Pattern/deviations: Step-through pattern;Decreased stride length   Gait velocity interpretation: Below normal speed for age/gender General Gait Details: pt with 2 periods of partial buckle but able to maintain balance without assist. cues for increased dorsiflexion RLE   Stairs Stairs: Yes   Stair Management: Step to pattern;Sideways;Forwards;One rail Right;One rail Left Number of Stairs: 11 General stair comments: pt walked up 6 steps sideways with right rail then up 11 steps forward with left rail. Cues for sequence but able to perform without physical assist  Wheelchair Mobility    Modified Rankin (Stroke Patients Only)       Balance Overall balance assessment: No apparent balance deficits (not formally assessed)                                           Cognition Arousal/Alertness: Awake/alert Behavior During Therapy: WFL for tasks assessed/performed Overall Cognitive Status: Within Functional Limits for tasks assessed                                        Exercises Total Joint Exercises Hip ABduction/ADduction: AROM;Right;Seated;15 reps Straight Leg Raises: AROM;Right;Seated;10 reps Long Arc Quad: AROM;Right;Seated;15 reps Knee Flexion: AROM;Right;Seated;5 reps Goniometric ROM: 7-84    General Comments        Pertinent Vitals/Pain Pain Score: 7  Pain Location: right knee Pain Descriptors / Indicators: Aching;Sore Pain Intervention(s): Limited activity within patient's tolerance;Repositioned    Home Living                      Prior Function            PT Goals (current goals can now be found in the care plan section) Acute Rehab PT Goals Patient Stated Goal: return to yard work PT Goal Formulation: With patient Time For Goal Achievement: 03/08/17 Potential to Achieve Goals: Good Progress towards PT goals: Progressing toward goals    Frequency           PT Plan Current plan remains appropriate    Co-evaluation  AM-PAC PT "6 Clicks" Daily Activity  Outcome Measure  Difficulty turning over in bed (including adjusting bedclothes, sheets and blankets)?: None Difficulty moving from lying on back to sitting on the side of the bed? : None Difficulty sitting down on and standing up from a chair with arms (e.g., wheelchair, bedside commode, etc,.)?: A Little Help needed moving to and from a bed to chair (including a wheelchair)?: None Help needed walking in hospital room?: None Help needed climbing 3-5 steps with a railing? : A Little 6 Click Score: 22    End of Session Equipment Utilized During Treatment: Gait belt Activity Tolerance: Patient tolerated treatment well Patient left: in chair;with call bell/phone within reach;with family/visitor present Nurse  Communication: Mobility status;Precautions PT Visit Diagnosis: Difficulty in walking, not elsewhere classified (R26.2);Muscle weakness (generalized) (M62.81);Pain     Time: 7290-2111 PT Time Calculation (min) (ACUTE ONLY): 23 min  Charges:  $Gait Training: 8-22 mins $Therapeutic Exercise: 8-22 mins                    G Codes:       Elwyn Reach, PT [phone]    [place] 03/01/2017, 12:15 PM

## 2017-03-01 NOTE — Anesthesia Postprocedure Evaluation (Signed)
Anesthesia Post Note  Patient: Todd Parsons  Procedure(s) Performed: Procedure(s) (LRB): RIGHT TOTAL KNEE ARTHROPLASTY (Right)     Patient location during evaluation: PACU Anesthesia Type: Regional, MAC and Spinal Level of consciousness: awake and alert Pain management: pain level controlled Vital Signs Assessment: post-procedure vital signs reviewed and stable Respiratory status: spontaneous breathing, nonlabored ventilation, respiratory function stable and patient connected to nasal cannula oxygen Cardiovascular status: stable and blood pressure returned to baseline Postop Assessment: no signs of nausea or vomiting and spinal receding Anesthetic complications: no    Last Vitals:  Vitals:   03/01/17 0746 03/01/17 1500  BP:  111/74  Pulse: 78 60  Resp:  17  Temp:  36.4 C    Last Pain:  Vitals:   03/01/17 1855  TempSrc:   PainSc: 3                  Samadhi Mahurin

## 2017-03-01 NOTE — Anesthesia Postprocedure Evaluation (Signed)
Anesthesia Post Note  Patient: Todd Parsons  Procedure(s) Performed: Procedure(s) (LRB): RIGHT TOTAL KNEE ARTHROPLASTY (Right)     Patient location during evaluation: PACU Anesthesia Type: Spinal and Regional Level of consciousness: awake and alert Pain management: pain level controlled Vital Signs Assessment: post-procedure vital signs reviewed and stable Respiratory status: spontaneous breathing and respiratory function stable Cardiovascular status: blood pressure returned to baseline and stable Postop Assessment: spinal receding Anesthetic complications: no    Last Vitals:  Vitals:   03/01/17 0439 03/01/17 0746  BP: 112/61   Pulse: 70 78  Resp: 16   Temp: 36.4 C     Last Pain:  Vitals:   03/01/17 1008  TempSrc:   PainSc: 8                  Justinn Welter,W. EDMOND

## 2017-03-01 NOTE — Progress Notes (Signed)
Subjective: 1 Day Post-Op Procedure(s) (LRB): RIGHT TOTAL KNEE ARTHROPLASTY (Right) Patient reports pain as mild. Did well with physical therapy this morning.  Foley catheter removed this morning.  Taking by mouth.  Denies chest pain or shortness of breath.   Objective: Vital signs in last 24 hours: Temp:  [97 F (36.1 C)-98 F (36.7 C)] 97.6 F (36.4 C) (06/26 0439) Pulse Rate:  [42-78] 78 (06/26 0746) Resp:  [11-23] 16 (06/26 0439) BP: (99-161)/(61-95) 112/61 (06/26 0439) SpO2:  [93 %-100 %] 94 % (06/26 0746) Weight:  [89.4 kg (197 lb 3 oz)] 89.4 kg (197 lb 3 oz) (06/25 1033)  Intake/Output from previous day: 06/25 0701 - 06/26 0700 In: 1810 [P.O.:60; I.V.:1650; IV Piggyback:100] Out: 1750 [Urine:1650; Blood:100] Intake/Output this shift: No intake/output data recorded.   Recent Labs  03/01/17 0621  HGB 12.2*    Recent Labs  03/01/17 0621  WBC 16.2*  RBC 4.18*  HCT 37.0*  PLT 148*    Recent Labs  03/01/17 0621  NA 135  K 4.2  CL 101  CO2 26  BUN 17  CREATININE 1.21  GLUCOSE 200*  CALCIUM 9.0   No results for input(s): LABPT, INR in the last 72 hours. Right knee exam: Neurovascular intact Sensation intact distally Intact pulses distally Dorsiflexion/Plantar flexion intact Incision: dressing C/D/I Compartment soft  Assessment/Plan: 1 Day Post-Op Procedure(s) (LRB): RIGHT TOTAL KNEE ARTHROPLASTY (Right)  Plan: Continue aspirin 325 mg twice daily for DVT prophylaxis 1 month postop. Weight-bear as tolerated on the right. Up with therapy  Can possibly be discharged home this afternoon if passes physical therapy.  Otherwise we will keep him until tomorrow morning.  Shantee Hayne G 03/01/2017, 8:38 AM

## 2017-03-01 NOTE — Progress Notes (Signed)
Scheduled metoprolol tartrate was not given at 2200 on 02/28/2017, pt BP 99/64, pulse 60 at 2020 and at 0010,BP was 102/61.pulse, 52, will continue to monitor.

## 2017-03-01 NOTE — Evaluation (Addendum)
Physical Therapy Evaluation Patient Details Name: Todd Parsons MRN: [id number] DOB: [birth year] Today's Date: 03/01/2017   History of Present Illness  71 yo admitted for Rt TKA. PMHx: arthritis, CABG, CAD, pulmonary fibrosis  Clinical Impression  Pt very pleasant and moving well. Pt in CPM on arrival with hip in external rotation with education for correct positioning in CPM as well as HEP, transfers, gait and bone foam use (in use end of session). Pt with decreased ROM, strength, transfers and gait who will benefit from acute therapy to maximize mobility, function, gait and independence to decrease burden of care.     Follow Up Recommendations DC plan and follow up therapy as arranged by surgeon    Equipment Recommendations  Rolling walker with 5" wheels;3in1 (PT)    Recommendations for Other Services       Precautions / Restrictions Precautions Precautions: Knee Restrictions RLE Weight Bearing: Weight bearing as tolerated      Mobility  Bed Mobility Overal bed mobility: Modified Independent                Transfers Overall transfer level: Needs assistance   Transfers: Sit to/from Stand Sit to Stand: Min guard         General transfer comment: cues for hand placement and safety with initial posterior LOB on first stand with repeated trial  Ambulation/Gait Ambulation/Gait assistance: Min guard Ambulation Distance (Feet): 150 Feet Assistive device: Rolling walker (2 wheeled) Gait Pattern/deviations: Step-through pattern;Decreased stride length   Gait velocity interpretation: Below normal speed for age/gender General Gait Details: pt with initial step to pattern with progression to step-through. cues for position in RW and posture  Stairs            Wheelchair Mobility    Modified Rankin (Stroke Patients Only)       Balance Overall balance assessment: No apparent balance deficits (not formally assessed)                                            Pertinent Vitals/Pain Pain Assessment: 0-10 Pain Score: 6  Pain Location: right knee Pain Descriptors / Indicators: Aching;Sore Pain Intervention(s): Limited activity within patient's tolerance;Repositioned;Ice applied    Home Living Family/patient expects to be discharged to:: Private residence Living Arrangements: Spouse/significant other Available Help at Discharge: Family;Available 24 hours/day Type of Home: House Home Access: Stairs to enter Entrance Stairs-Rails: Psychiatric nurse of Steps: 4 Home Layout: Two level;Bed/bath upstairs Home Equipment: Cane - single point Additional Comments: pt has his grandmother's RW    Prior Function Level of Independence: Independent               Hand Dominance        Extremity/Trunk Assessment   Upper Extremity Assessment Upper Extremity Assessment: Overall WFL for tasks assessed    Lower Extremity Assessment Lower Extremity Assessment: RLE deficits/detail RLE Deficits / Details: decreased ROm and strength post op    Cervical / Trunk Assessment Cervical / Trunk Assessment: Normal  Communication   Communication: HOH  Cognition Arousal/Alertness: Awake/alert Behavior During Therapy: WFL for tasks assessed/performed Overall Cognitive Status: Within Functional Limits for tasks assessed                                        General Comments  Exercises Total Joint Exercises Quad Sets: AROM;Supine;Right;10 reps Heel Slides: AROM;Right;10 reps;Supine Hip ABduction/ADduction: AROM;Right;10 reps;Supine Straight Leg Raises: AROM;Right;10 reps   Assessment/Plan    PT Assessment Patient needs continued PT services  PT Problem List Decreased strength;Decreased mobility;Decreased range of motion;Decreased activity tolerance;Decreased knowledge of use of DME;Pain       PT Treatment Interventions DME instruction;Therapeutic activities;Gait training;Therapeutic  exercise;Patient/family education;Stair training;Functional mobility training    PT Goals (Current goals can be found in the Care Plan section)  Acute Rehab PT Goals Patient Stated Goal: return to yard work PT Goal Formulation: With patient Time For Goal Achievement: 03/08/17 Potential to Achieve Goals: Good    Frequency 7X/week   Barriers to discharge        Co-evaluation               AM-PAC PT "6 Clicks" Daily Activity  Outcome Measure Difficulty turning over in bed (including adjusting bedclothes, sheets and blankets)?: None Difficulty moving from lying on back to sitting on the side of the bed? : A Little Difficulty sitting down on and standing up from a chair with arms (e.g., wheelchair, bedside commode, etc,.)?: A Little Help needed moving to and from a bed to chair (including a wheelchair)?: A Little Help needed walking in hospital room?: A Little Help needed climbing 3-5 steps with a railing? : A Little 6 Click Score: 19    End of Session Equipment Utilized During Treatment: Gait belt Activity Tolerance: Patient tolerated treatment well Patient left: in chair;with call bell/phone within reach;with nursing/sitter in room Nurse Communication: Mobility status;Precautions PT Visit Diagnosis: Difficulty in walking, not elsewhere classified (R26.2);Muscle weakness (generalized) (M62.81);Pain Pain - Right/Left: Right Pain - part of body: Knee    Time: 4259-5638 PT Time Calculation (min) (ACUTE ONLY): 31 min   Charges:   PT Evaluation $PT Eval Moderate Complexity: 1 Procedure PT Treatments $Gait Training: 8-22 mins   PT G Codes:        Elwyn Reach, PT [phone]   [place] B Makinsley Schiavi 03/01/2017, 8:29 AM

## 2017-03-02 LAB — CBC
HCT: 32.6 % — ABNORMAL LOW (ref 39.0–52.0)
HEMOGLOBIN: 10.7 g/dL — AB (ref 13.0–17.0)
MCH: 29.6 pg (ref 26.0–34.0)
MCHC: 32.8 g/dL (ref 30.0–36.0)
MCV: 90.1 fL (ref 78.0–100.0)
Platelets: 150 10*3/uL (ref 150–400)
RBC: 3.62 MIL/uL — AB (ref 4.22–5.81)
RDW: 14.2 % (ref 11.5–15.5)
WBC: 18.7 10*3/uL — AB (ref 4.0–10.5)

## 2017-03-02 LAB — GLUCOSE, CAPILLARY
Glucose-Capillary: 154 mg/dL — ABNORMAL HIGH (ref 65–99)
Glucose-Capillary: 147 mg/dL — ABNORMAL HIGH (ref 65–99)

## 2017-03-02 NOTE — Progress Notes (Signed)
Physical Therapy Treatment Patient Details Name: Todd Parsons MRN: [id number] DOB: 09/10/1945 Today's Date: 03/02/2017    History of Present Illness 71 yo admitted for Rt TKA. PMHx: arthritis, CABG, CAD, pulmonary fibrosis    PT Comments    Pt is making great progress in mobility through increased Rt knee ROM and safe use of RW for ambulation. Pt shows great improvements in Rt knee extension; however will continue to benefit from mobilization and use of CPM machine for continued increase in knee flexion. Pt able to verbalize proper use and time parameters for CPM machine and bone foam. Pt would benefit from continued acute therapy for mobilization and LE strengthening to return home.    Follow Up Recommendations  DC plan and follow up therapy as arranged by surgeon     Equipment Recommendations  Rolling walker with 5" wheels;3in1 (PT)    Recommendations for Other Services       Precautions / Restrictions Precautions Precautions: Knee;Fall Restrictions Weight Bearing Restrictions: Yes RLE Weight Bearing: Weight bearing as tolerated    Mobility  Bed Mobility Overal bed mobility: Modified Independent                Transfers Overall transfer level: Modified independent   Transfers: Sit to/from Stand Sit to Stand: Min guard         General transfer comment: Min guard. No VCs needed for hand placement today and no LOB.   Ambulation/Gait Ambulation/Gait assistance: Min guard Ambulation Distance (Feet): 1000 Feet Assistive device: Rolling walker (2 wheeled) Gait Pattern/deviations: Step-through pattern;Decreased stride length     General Gait Details: VCs for increased DF of R LE and posturing (relaxation of shoulders)    Stairs Stairs: Yes   Stair Management: Step to pattern;Sideways;Forwards;One rail Right Min. Guard Number of Stairs: 11 General stair comments: Pt able to navigate stairs without need for VCs for sequencing   Wheelchair Mobility     Modified Rankin (Stroke Patients Only)       Balance Overall balance assessment: No apparent balance deficits (not formally assessed)                                          Cognition Arousal/Alertness: Awake/alert Behavior During Therapy: WFL for tasks assessed/performed Overall Cognitive Status: Within Functional Limits for tasks assessed                                        Exercises Total Joint Exercises Heel Slides: AROM;Right;15 reps;Seated Hip ABduction/ADduction: AROM;Right;15 reps;Seated Straight Leg Raises: AROM;Right;Seated;15 reps Long Arc Quad: AROM;Right;Seated;15 reps Goniometric ROM: 0-73 active  Marching in Standing: Seated;Right;AROM;15 reps    General Comments        Pertinent Vitals/Pain Pain Assessment: 0-10 Pain Score: 3  (Pt reported no pain at beginning of session; pain 2-3 after ambulating 1000 ft. with RW) Pain Location: right knee Pain Descriptors / Indicators: Aching;Sore Pain Intervention(s): Monitored during session;Repositioned    Home Living                      Prior Function            PT Goals (current goals can now be found in the care plan section) Progress towards PT goals: Progressing toward goals    Frequency  7X/week      PT Plan Current plan remains appropriate    Co-evaluation              AM-PAC PT "6 Clicks" Daily Activity  Outcome Measure  Difficulty turning over in bed (including adjusting bedclothes, sheets and blankets)?: None Difficulty moving from lying on back to sitting on the side of the bed? : None Difficulty sitting down on and standing up from a chair with arms (e.g., wheelchair, bedside commode, etc,.)?: A Little Help needed moving to and from a bed to chair (including a wheelchair)?: None Help needed walking in hospital room?: None Help needed climbing 3-5 steps with a railing? : A Little 6 Click Score: 22    End of Session Equipment  Utilized During Treatment: Gait belt Activity Tolerance: Patient tolerated treatment well Patient left: in chair;with call bell/phone within reach   PT Visit Diagnosis: Difficulty in walking, not elsewhere classified (R26.2);Muscle weakness (generalized) (M62.81);Pain Pain - Right/Left: Right Pain - part of body: Knee     Time: 5825-1898 PT Time Calculation (min) (ACUTE ONLY): 28 min  Charges:  $Gait Training: 8-22 mins $Therapeutic Exercise: 8-22 mins                    G Codes:       Elberta Leatherwood, [place] [facility] [place] 03/02/2017, 12:59 PM

## 2017-03-02 NOTE — Progress Notes (Signed)
Pt and wife given all discharge instructions and questions were discussed and answered.  Pt and wife also understands all new and changed meds. Hard prescriptions were given to pt. Wife has made contact/appt for cpm delivery.  Pt was without complaints at discharge with wife.

## 2017-03-02 NOTE — Discharge Summary (Signed)
Patient ID: Todd Parsons MRN: [id number] DOB/AGE: Jul 18, 1946 71 y.o.  Admit date: 02/28/2017 Discharge date: 03/02/2017  Admission Diagnoses:  Principal Problem:   Primary osteoarthritis of right knee   Discharge Diagnoses:  Same  Past Medical History:  Diagnosis Date  . Arthritis    osteoarthritis  . Bicuspid aortic valve   . Coronary artery disease   . Dysrhythmia    atrial flutter  . Echocardiogram with ECG monitoring 01/12/2010   60-65% EF  . Hyperlipidemia   . Hypertension   . Pre-diabetes   . Sleep apnea     Surgeries: Procedure(s): RIGHT TOTAL KNEE ARTHROPLASTY on 02/28/2017   Discharged Condition: Improved  Hospital Course: Todd Parsons is an 71 y.o. male who was admitted 02/28/2017 for operative treatment ofPrimary osteoarthritis of right knee. Patient has severe unremitting pain that affects sleep, daily activities, and work/hobbies. After pre-op clearance the patient was taken to the operating room on 02/28/2017 and underwent  Procedure(s): RIGHT TOTAL KNEE ARTHROPLASTY.    Patient was given perioperative antibiotics: Anti-infectives    Start     Dose/Rate Route Frequency Ordered Stop   02/28/17 1900  ceFAZolin (ANCEF) IVPB 2g/100 mL premix     2 g 200 mL/hr over 30 Minutes Intravenous Every 6 hours 02/28/17 1750 03/01/17 0100   02/28/17 1200  ceFAZolin (ANCEF) IVPB 2g/100 mL premix  Status:  Discontinued     2 g 200 mL/hr over 30 Minutes Intravenous To ShortStay Surgical 02/25/17 1014 02/28/17 1739       Patient was given sequential compression devices, early ambulation, and chemoprophylaxis to prevent DVT.  Patient benefited maximally from hospital stay and there were no complications.    Recent vital signs: Patient Vitals for the past 24 hrs:  BP Temp Temp src Pulse Resp SpO2  03/02/17 1021 - - - (!) 55 - -  03/02/17 1017 115/68 - - - - -  03/02/17 0404 124/67 97.8 F (36.6 C) Oral 60 16 95 %  03/01/17 2048 107/66 98 F (36.7 C) Oral (!) 58 16 96 %   03/01/17 1500 111/74 97.6 F (36.4 C) Oral 60 17 95 %     Recent laboratory studies:  Recent Labs  03/01/17 0621 03/02/17 0509  WBC 16.2* 18.7*  HGB 12.2* 10.7*  HCT 37.0* 32.6*  PLT 148* 150  NA 135  --   K 4.2  --   CL 101  --   CO2 26  --   BUN 17  --   CREATININE 1.21  --   GLUCOSE 200*  --   CALCIUM 9.0  --      Discharge Medications:   Allergies as of 03/02/2017      Reactions   Simvastatin Other (See Comments)   Myalgias      Medication List    STOP taking these medications   aspirin 81 MG tablet Replaced by:  aspirin EC 325 MG tablet   naproxen sodium 220 MG tablet Commonly known as:  ANAPROX   UNABLE TO FIND   warfarin 5 MG tablet Commonly known as:  COUMADIN     TAKE these medications   ACCU-CHEK AVIVA Soln For use of calibration of meter   AMBULATORY NON FORMULARY MEDICATION Medication Name: glucometer, meter and strips to test 2 times per week. Dx: Diabetes Type 2.   amLODipine 2.5 MG tablet Commonly known as:  NORVASC Take 1 tablet (2.5 mg total) by mouth daily.   aspirin EC 325 MG tablet Take 1  tablet (325 mg total) by mouth 2 (two) times daily after a meal. Take x 1 month post op to decrease risk of blood clots. Replaces:  aspirin 81 MG tablet   atorvastatin 40 MG tablet Commonly known as:  LIPITOR Take 1 tablet (40 mg total) by mouth at bedtime.   B-D SINGLE USE SWABS REGULAR Pads For use when testing glucose   docusate sodium 100 MG capsule Commonly known as:  COLACE Take 1 capsule (100 mg total) by mouth 2 (two) times daily.   fenofibrate 160 MG tablet Take 1 tablet (160 mg total) by mouth daily.   FLUoxetine 20 MG capsule Commonly known as:  PROZAC Take 1 capsule (20 mg total) by mouth daily.   fluticasone 50 MCG/ACT nasal spray Commonly known as:  FLONASE Place 2 sprays into both nostrils daily. What changed:  when to take this  reasons to take this   metFORMIN 500 MG 24 hr tablet Commonly known as:   GLUCOPHAGE-XR Take 1 tablet (500 mg total) by mouth daily with breakfast.   metoprolol tartrate 50 MG tablet Commonly known as:  LOPRESSOR Take 1 tablet (50 mg total) by mouth 2 (two) times daily.   multivitamin with minerals Tabs tablet Take 1 tablet by mouth daily.   oxybutynin 5 MG tablet Commonly known as:  DITROPAN Take 5 mg by mouth daily.   oxyCODONE-acetaminophen 5-325 MG tablet Commonly known as:  PERCOCET/ROXICET Take 1-2 tablets by mouth every 4 (four) hours as needed for severe pain.   terazosin 2 MG capsule Commonly known as:  HYTRIN TAKE 1 CAPSULE AT BEDTIME   tiZANidine 2 MG tablet Commonly known as:  ZANAFLEX Take 1 tablet (2 mg total) by mouth every 8 (eight) hours as needed for muscle spasms.       Diagnostic Studies: No results found.  Disposition: Final discharge disposition not confirmed  Discharge Instructions    CPM    Complete by:  As directed    Continuous passive motion machine (CPM):      Use the CPM from 0 to 70 for 8 hours per day.      You may increase by 5-10 per day.  You may break it up into 2 or 3 sessions per day.      Use CPM for 1-2 weeks or until you are told to stop.   Call MD / Call 911    Complete by:  As directed    If you experience chest pain or shortness of breath, CALL 911 and be transported to the hospital emergency room.  If you develope a fever above 101 F, pus (white drainage) or increased drainage or redness at the wound, or calf pain, call your surgeon's office.   Constipation Prevention    Complete by:  As directed    Drink plenty of fluids.  Prune juice may be helpful.  You may use a stool softener, such as Colace (over the counter) 100 mg twice a day.  Use MiraLax (over the counter) for constipation as needed.   Diet Carb Modified    Complete by:  As directed    Do not put a pillow under the knee. Place it under the heel.    Complete by:  As directed    Increase activity slowly as tolerated    Complete by:  As  directed    Weight bearing as tolerated    Complete by:  As directed    Laterality:  right   Extremity:  Lower  Follow-up Information    Dorna Leitz, MD. Schedule an appointment as soon as possible for a visit in 2 week(s).   Specialty:  Orthopedic Surgery Contact information: [place] [address] [place], [facility] Follow up.   Why:  A representative from [facility] will contact you to arrange start date and time for your therapy. Contact information: [address] [place] [address] [phone]            Signed: Erlene Senters 03/02/2017, 11:01 AM

## 2017-03-02 NOTE — Progress Notes (Signed)
Subjective: 2 Days Post-Op Procedure(s) (LRB): RIGHT TOTAL KNEE ARTHROPLASTY (Right) Patient reports pain as mild.  Good progress with PT. Taking by mouth and voiding ok.  Objective: Vital signs in last 24 hours: Temp:  [97.6 F (36.4 C)-98 F (36.7 C)] 97.8 F (36.6 C) (06/27 0404) Pulse Rate:  [58-60] 60 (06/27 0404) Resp:  [16-17] 16 (06/27 0404) BP: (107-124)/(66-74) 115/68 (06/27 1017) SpO2:  [95 %-96 %] 95 % (06/27 0404)  Intake/Output from previous day: 06/26 0701 - 06/27 0700 In: 1076 [P.O.:1076] Out: -  Intake/Output this shift: Total I/O In: 236 [P.O.:236] Out: -    Recent Labs  03/01/17 0621 03/02/17 0509  HGB 12.2* 10.7*    Recent Labs  03/01/17 0621 03/02/17 0509  WBC 16.2* 18.7*  RBC 4.18* 3.62*  HCT 37.0* 32.6*  PLT 148* 150    Recent Labs  03/01/17 0621  NA 135  K 4.2  CL 101  CO2 26  BUN 17  CREATININE 1.21  GLUCOSE 200*  CALCIUM 9.0   No results for input(s): LABPT, INR in the last 72 hours. Right knee exam: Neurovascular intact Sensation intact distally Intact pulses distally Dorsiflexion/Plantar flexion intact Incision: dressing C/D/I Compartment soft  Assessment/Plan: 2 Days Post-Op Procedure(s) (LRB): RIGHT TOTAL KNEE ARTHROPLASTY (Right)  Plan: ASA 325mg  BID x 1 month post op for dvt prophylaxis. Up with therapy Discharge home with home health  F/U with Dr Berenice Primas in 2 weeks.  [place] G 03/02/2017, 10:20 AM

## 2017-03-03 ENCOUNTER — Encounter (HOSPITAL_COMMUNITY): Payer: Self-pay | Admitting: Orthopedic Surgery

## 2017-03-03 DIAGNOSIS — Z952 Presence of prosthetic heart valve: Secondary | ICD-10-CM | POA: Diagnosis not present

## 2017-03-03 DIAGNOSIS — M1711 Unilateral primary osteoarthritis, right knee: Secondary | ICD-10-CM | POA: Diagnosis not present

## 2017-03-03 DIAGNOSIS — Z96651 Presence of right artificial knee joint: Secondary | ICD-10-CM | POA: Diagnosis not present

## 2017-03-03 DIAGNOSIS — I2510 Atherosclerotic heart disease of native coronary artery without angina pectoris: Secondary | ICD-10-CM | POA: Diagnosis not present

## 2017-03-03 DIAGNOSIS — Z471 Aftercare following joint replacement surgery: Secondary | ICD-10-CM | POA: Diagnosis not present

## 2017-03-03 DIAGNOSIS — G4733 Obstructive sleep apnea (adult) (pediatric): Secondary | ICD-10-CM | POA: Diagnosis not present

## 2017-03-03 DIAGNOSIS — I10 Essential (primary) hypertension: Secondary | ICD-10-CM | POA: Diagnosis not present

## 2017-03-06 DIAGNOSIS — Z96651 Presence of right artificial knee joint: Secondary | ICD-10-CM | POA: Diagnosis not present

## 2017-03-06 DIAGNOSIS — M1711 Unilateral primary osteoarthritis, right knee: Secondary | ICD-10-CM | POA: Diagnosis not present

## 2017-03-09 DIAGNOSIS — I2510 Atherosclerotic heart disease of native coronary artery without angina pectoris: Secondary | ICD-10-CM | POA: Diagnosis not present

## 2017-03-09 DIAGNOSIS — Z96651 Presence of right artificial knee joint: Secondary | ICD-10-CM | POA: Diagnosis not present

## 2017-03-09 DIAGNOSIS — Z471 Aftercare following joint replacement surgery: Secondary | ICD-10-CM | POA: Diagnosis not present

## 2017-03-09 DIAGNOSIS — Z952 Presence of prosthetic heart valve: Secondary | ICD-10-CM | POA: Diagnosis not present

## 2017-03-09 DIAGNOSIS — I10 Essential (primary) hypertension: Secondary | ICD-10-CM | POA: Diagnosis not present

## 2017-03-09 DIAGNOSIS — G4733 Obstructive sleep apnea (adult) (pediatric): Secondary | ICD-10-CM | POA: Diagnosis not present

## 2017-03-11 DIAGNOSIS — I2510 Atherosclerotic heart disease of native coronary artery without angina pectoris: Secondary | ICD-10-CM | POA: Diagnosis not present

## 2017-03-11 DIAGNOSIS — I10 Essential (primary) hypertension: Secondary | ICD-10-CM | POA: Diagnosis not present

## 2017-03-11 DIAGNOSIS — Z96651 Presence of right artificial knee joint: Secondary | ICD-10-CM | POA: Diagnosis not present

## 2017-03-11 DIAGNOSIS — G4733 Obstructive sleep apnea (adult) (pediatric): Secondary | ICD-10-CM | POA: Diagnosis not present

## 2017-03-11 DIAGNOSIS — Z471 Aftercare following joint replacement surgery: Secondary | ICD-10-CM | POA: Diagnosis not present

## 2017-03-11 DIAGNOSIS — Z952 Presence of prosthetic heart valve: Secondary | ICD-10-CM | POA: Diagnosis not present

## 2017-03-15 DIAGNOSIS — I2510 Atherosclerotic heart disease of native coronary artery without angina pectoris: Secondary | ICD-10-CM | POA: Diagnosis not present

## 2017-03-15 DIAGNOSIS — Z96651 Presence of right artificial knee joint: Secondary | ICD-10-CM | POA: Diagnosis not present

## 2017-03-15 DIAGNOSIS — M1711 Unilateral primary osteoarthritis, right knee: Secondary | ICD-10-CM | POA: Diagnosis not present

## 2017-03-15 DIAGNOSIS — G4733 Obstructive sleep apnea (adult) (pediatric): Secondary | ICD-10-CM | POA: Diagnosis not present

## 2017-03-15 DIAGNOSIS — I10 Essential (primary) hypertension: Secondary | ICD-10-CM | POA: Diagnosis not present

## 2017-03-15 DIAGNOSIS — Z952 Presence of prosthetic heart valve: Secondary | ICD-10-CM | POA: Diagnosis not present

## 2017-03-15 DIAGNOSIS — Z471 Aftercare following joint replacement surgery: Secondary | ICD-10-CM | POA: Diagnosis not present

## 2017-03-17 DIAGNOSIS — Z952 Presence of prosthetic heart valve: Secondary | ICD-10-CM | POA: Diagnosis not present

## 2017-03-17 DIAGNOSIS — Z471 Aftercare following joint replacement surgery: Secondary | ICD-10-CM | POA: Diagnosis not present

## 2017-03-17 DIAGNOSIS — Z96651 Presence of right artificial knee joint: Secondary | ICD-10-CM | POA: Diagnosis not present

## 2017-03-17 DIAGNOSIS — I10 Essential (primary) hypertension: Secondary | ICD-10-CM | POA: Diagnosis not present

## 2017-03-17 DIAGNOSIS — I2510 Atherosclerotic heart disease of native coronary artery without angina pectoris: Secondary | ICD-10-CM | POA: Diagnosis not present

## 2017-03-17 DIAGNOSIS — G4733 Obstructive sleep apnea (adult) (pediatric): Secondary | ICD-10-CM | POA: Diagnosis not present

## 2017-03-23 DIAGNOSIS — I10 Essential (primary) hypertension: Secondary | ICD-10-CM | POA: Diagnosis not present

## 2017-03-23 DIAGNOSIS — Z952 Presence of prosthetic heart valve: Secondary | ICD-10-CM | POA: Diagnosis not present

## 2017-03-23 DIAGNOSIS — G4733 Obstructive sleep apnea (adult) (pediatric): Secondary | ICD-10-CM | POA: Diagnosis not present

## 2017-03-23 DIAGNOSIS — Z471 Aftercare following joint replacement surgery: Secondary | ICD-10-CM | POA: Diagnosis not present

## 2017-03-23 DIAGNOSIS — I2510 Atherosclerotic heart disease of native coronary artery without angina pectoris: Secondary | ICD-10-CM | POA: Diagnosis not present

## 2017-03-23 DIAGNOSIS — Z96651 Presence of right artificial knee joint: Secondary | ICD-10-CM | POA: Diagnosis not present

## 2017-03-25 DIAGNOSIS — I10 Essential (primary) hypertension: Secondary | ICD-10-CM | POA: Diagnosis not present

## 2017-03-25 DIAGNOSIS — Z471 Aftercare following joint replacement surgery: Secondary | ICD-10-CM | POA: Diagnosis not present

## 2017-03-25 DIAGNOSIS — Z953 Presence of xenogenic heart valve: Secondary | ICD-10-CM | POA: Diagnosis not present

## 2017-03-25 DIAGNOSIS — I059 Rheumatic mitral valve disease, unspecified: Secondary | ICD-10-CM | POA: Diagnosis not present

## 2017-03-25 DIAGNOSIS — G4733 Obstructive sleep apnea (adult) (pediatric): Secondary | ICD-10-CM | POA: Diagnosis not present

## 2017-03-25 DIAGNOSIS — I2510 Atherosclerotic heart disease of native coronary artery without angina pectoris: Secondary | ICD-10-CM | POA: Diagnosis not present

## 2017-03-25 DIAGNOSIS — Z952 Presence of prosthetic heart valve: Secondary | ICD-10-CM | POA: Diagnosis not present

## 2017-03-25 DIAGNOSIS — Z96651 Presence of right artificial knee joint: Secondary | ICD-10-CM | POA: Diagnosis not present

## 2017-03-25 DIAGNOSIS — I517 Cardiomegaly: Secondary | ICD-10-CM | POA: Diagnosis not present

## 2017-03-29 DIAGNOSIS — M1711 Unilateral primary osteoarthritis, right knee: Secondary | ICD-10-CM | POA: Diagnosis not present

## 2017-04-06 ENCOUNTER — Encounter: Payer: Self-pay | Admitting: Rehabilitative and Restorative Service Providers"

## 2017-04-06 ENCOUNTER — Ambulatory Visit (INDEPENDENT_AMBULATORY_CARE_PROVIDER_SITE_OTHER): Payer: Medicare Other | Admitting: Rehabilitative and Restorative Service Providers"

## 2017-04-06 DIAGNOSIS — M6281 Muscle weakness (generalized): Secondary | ICD-10-CM

## 2017-04-06 DIAGNOSIS — M25561 Pain in right knee: Secondary | ICD-10-CM | POA: Diagnosis not present

## 2017-04-06 DIAGNOSIS — M25661 Stiffness of right knee, not elsewhere classified: Secondary | ICD-10-CM | POA: Diagnosis not present

## 2017-04-06 NOTE — Therapy (Signed)
Todd Parsons [place], [place], [address] Phone: [phone]   Fax:  [phone]  Physical Therapy Evaluation  Patient Details  Name: Todd Parsons MRN: [id number] Date of Birth: 25-Sep-1945 Referring Provider: Dr Dorna Leitz  Encounter Date: 04/06/2017      PT End of Session - 04/06/17 0755    Visit Number 1   Number of Visits 12   Date for PT Re-Evaluation 05/18/17   PT Start Time 0800   PT Stop Time 0902   PT Time Calculation (min) 62 min   Activity Tolerance Patient tolerated treatment well      Past Medical History:  Diagnosis Date  . Arthritis    osteoarthritis  . Bicuspid aortic valve   . Coronary artery disease   . Dysrhythmia    atrial flutter  . Echocardiogram with ECG monitoring 01/12/2010   60-65% EF  . Hyperlipidemia   . Hypertension   . Pre-diabetes   . Sleep apnea     Past Surgical History:  Procedure Laterality Date  . A-FLUTTER ABLATION  2017  . ACROMIO-CLAVICULAR JOINT REPAIR Right 1980s      . apnea device implant     then removed.    Marland Kitchen CARDIAC CATHETERIZATION  07/2015  . CARDIAC VALVE REPLACEMENT  08/05/2015   Aortic valve  . CORONARY ARTERY BYPASS GRAFT  08/05/2015  . left knee arthoscopy    . NOSE SURGERY  late 50's   to fix a broken nose  . TONSILECTOMY, ADENOIDECTOMY, BILATERAL MYRINGOTOMY AND TUBES    . TOTAL KNEE ARTHROPLASTY Right 02/28/2017   Procedure: RIGHT TOTAL KNEE ARTHROPLASTY;  Surgeon: Dorna Leitz, MD;  Location: [facility];  Service: Orthopedics;  Laterality: Right;    There were no vitals filed for this visit.       Subjective Assessment - 04/06/17 0809    Subjective Arthritis in Rt knee for years treated with injections over the past couple of years. Underwent elective Rt TKA 02/28/17. Now improving post op with less pain and improved function. He still has some intermittent discomort and limitations.    Pertinent History [facility] mitral valve and thoracic  aortic aneurysm 11/17; arthritis   How long can you sit comfortably? 1-2 hours    How long can you stand comfortably? 30 min    How long can you walk comfortably? 30 min    Diagnostic tests xrays    Patient Stated Goals make sure he is progressing well with ROM and strength - good improvement in the knee    Currently in Pain? No/denies   Pain Location Knee   Pain Orientation Right   Pain Descriptors / Indicators Aching   Pain Type Surgical pain   Pain Onset More than a month ago   Pain Frequency Intermittent   Aggravating Factors  bending knee; temperature changes cool or warm air    Pain Relieving Factors rest; heat             [facility] PT Assessment - 04/06/17 0001      Assessment   Medical Diagnosis Rt TKA   Referring Provider Dr Dorna Leitz   Onset Date/Surgical Date 02/28/17   Hand Dominance Right   Next MD Visit 9/18   Prior Therapy home health after surgery      Precautions   Precautions None     Balance Screen   Has the patient fallen in the past 6 months No   Has the patient had a decrease  in activity level because of a fear of falling?  No   Is the patient reluctant to leave their home because of a fear of falling?  No     Prior Function   Level of Independence Independent   Vocation Retired  ~ 2 years    Engineer, manufacturing and computer work    Leisure yard work; Building control surveyor  - was in gym 2-3 times a week before knee started hurting more      Observation/Other Assessments   Focus on Therapeutic Outcomes (FOTO)  44% limitation      Sensation   Additional Comments WFL's per pt report      Posture/Postural Control   Posture Comments head forward; soulders rounded; Lt hemipelvis lower that Rt      AROM   Right/Left Hip --  WFL's bilat    Right Knee Extension -7   Right Knee Flexion 113   Left Knee Extension 5   Left Knee Flexion 130     Strength   Right/Left Hip --  5/5 except bilat hip abd 5-/5; Rt hip ext 5-/5      Flexibility    Hamstrings tight Rt 75 deg; lt 80 deg   Quadriceps tight Rt 111 deg; Lt 128 deg      Palpation   Palpation comment tight and tender peripatellar area Rt knee      Ambulation/Gait   Gait Comments ambulates without assistive device - slight limp Rt LE in wt bearing Rt             Objective measurements completed on examination: See above findings.          [facility] Adult PT Treatment/Exercise - 04/06/17 0001      Self-Care   Self-Care --  added 1/4 in lift Lt shoe to improve hip level      Knee/Hip Exercises: Stretches   Passive Hamstring Stretch 3 reps;30 seconds  supine w/ strap    Quad Stretch 3 reps;30 seconds  prone w/ strap      Knee/Hip Exercises: Supine   Quad Sets Strengthening;Right;10 reps  10 sec hold heel propped on foam roll      Knee/Hip Exercises: Prone   Hip Extension Strengthening;Right;10 reps   Other Prone Exercises glute set 10 sec x 10      Cryotherapy   Number Minutes Cryotherapy 12 Minutes   Cryotherapy Location Knee  Rt   Type of Cryotherapy Ice pack                PT Education - 04/06/17 8676    Education provided Yes   Education Details HEP eval findings    Person(s) Educated Patient   Methods Explanation;Demonstration;Tactile cues;Verbal cues;Handout   Comprehension Verbalized understanding;Returned demonstration;Verbal cues required;Tactile cues required             PT Long Term Goals - 04/06/17 1024      PT LONG TERM GOAL #1   Title Normal gait pattern with no limp 05/18/17   Time 6   Period Weeks   Status New     PT LONG TERM GOAL #2   Title Increase AROM Rt knee to 0 deg extension and 125 deg flexion 05/18/17   Time 6   Period Weeks     PT LONG TERM GOAL #3   Title 5/5 strength bilat LE's 05/18/17   Time 6   Period Weeks   Status New     PT LONG TERM GOAL #4  Title Independent in HEP 05/18/17   Time 6   Period Weeks   Status New     PT LONG TERM GOAL #5   Title Improve FOTO to </= 35% limitation  05/18/17   Time 6   Period Weeks   Status New                Plan - April 22, 2017 0847    Clinical Impression Statement Todd Parsons presents s/p Rt TKA 02/28/17. He is porgressing well with post op rehab but continues to have limited ROM and decreased strength Rt LE as well as leg length difference and abnormal gait pattern. He has limited functional activity level and endurance. He will benefit from PT to address problems identified.    Clinical Presentation Stable   Clinical Decision Making Low   Rehab Potential Good   PT Frequency 2x / week   PT Duration 6 weeks   PT Treatment/Interventions Patient/family education;ADLs/Self Care Home Management;Cryotherapy;Electrical Stimulation;Iontophoresis 4mg /ml Dexamethasone;Moist Heat;Ultrasound;Dry needling;Manual techniques;Therapeutic activities;Therapeutic exercise;Neuromuscular re-education;Gait training;Balance training   PT Next Visit Plan review HEP; progress with exercise with focus on full AROM Rt knee; strengthening; gait and endurance.    Consulted and Agree with Plan of Care Patient      Patient will benefit from skilled therapeutic intervention in order to improve the following deficits and impairments:  Postural dysfunction, Pain, Decreased strength, Decreased mobility, Decreased range of motion, Abnormal gait, Decreased activity tolerance, Decreased endurance  Visit Diagnosis: Acute pain of right knee - Plan: PT plan of care cert/re-cert  Muscle weakness (generalized) - Plan: PT plan of care cert/re-cert  Stiffness of right knee, not elsewhere classified - Plan: PT plan of care cert/re-cert      [facility] PT PB G-CODES - 22-Apr-2017 1027    Functional Assessment Tool Used  Evaluation; FOTO; clinical assessment    Functional Limitations Mobility: Walking and moving around   Mobility: Walking and Moving Around Current Status At least 40 percent but less than 60 percent impaired, limited or restricted   Mobility: Walking and Moving Around  Goal Status [phone]) At least 20 percent but less than 40 percent impaired, limited or restricted       Problem List Patient Active Problem List   Diagnosis Date Noted  . Primary osteoarthritis of right knee 02/28/2017  . Wears hearing aid 06/02/2016  . OAB (overactive bladder) 04/02/2016  . Primary osteoarthritis of both knees 02/10/2016  . Pulmonary fibrosis, unspecified ([place]) 12/22/2015  . Long term current use of anticoagulant therapy 10/17/2015  . H/O aortic valve replacement 09/15/2015  . Atrial flutter ([place]) 09/15/2015  . IFG (impaired fasting glucose) 01/30/2015  . Restrictive airway disease 08/09/2014  . Hx of arthroscopy of knee 06/08/2011  . Essential hypertension, benign 05/05/2011  . Hyperlipidemia LDL goal <100 05/05/2011  . OSA (obstructive sleep apnea) 05/05/2011  . Aneurysm of thoracic aorta ([place]) 05/05/2011  . Congenital insufficiency of aortic valve 05/05/2011  . Bicuspid aortic valve 05/05/2011  . Vitamin D deficiency 05/05/2011    Zayneb Baucum Nilda Simmer PT, MPH 04/22/17, 10:40 AM  [facility] [place], [place], [address] Phone: [phone]   Fax:  [phone]  Name: Todd Parsons MRN: [id number] Date of Birth: 05/29/1946

## 2017-04-06 NOTE — Patient Instructions (Addendum)
HIP: Hamstrings - Supine   Place strap around foot. Raise leg up, keeping knee straight.  Bend opposite knee to protect back if indicated. Hold 30 seconds. 3 reps per set, 2-3 sets per day   Quads / HF, Prone   Lie face down. Grasp one ankle with same-side hand. Use towel if needed to reach. Gently pull foot toward buttock.  Hold 30 seconds. Repeat 3 times per session. Do 2-3 sessions per day.  Gluteal Sets   Lying on stomach Tighten bottom. Hold for _10__ seconds. Repeat _10__ times. Do _1__ times a day.   Strengthening: Hip Extension (Prone)    Tighten muscles on back of right thigh, then lift leg _8-10___ inches from surface, keeping knee locked. Repeat _10___ times per set. Do _2-3___ sets per session. Do __1__ sessions per day.   Quad Sets    Slowly tighten thigh muscles of straight,right leg with heel propped on a small roll. Counting out loud to _10___. Relax. Repeat _10___ times. Do __2-3__ sessions per day.

## 2017-04-07 ENCOUNTER — Other Ambulatory Visit: Payer: Self-pay

## 2017-04-07 MED ORDER — METOPROLOL TARTRATE 50 MG PO TABS: 50.0000 mg | ORAL_TABLET | Freq: Two times a day (BID) | ORAL | 0 refills | Status: DC

## 2017-04-07 MED ORDER — AMLODIPINE BESYLATE 2.5 MG PO TABS: 2.5000 mg | ORAL_TABLET | Freq: Every day | ORAL | 0 refills | Status: DC

## 2017-04-08 ENCOUNTER — Other Ambulatory Visit: Payer: Self-pay

## 2017-04-08 MED ORDER — FENOFIBRATE 160 MG PO TABS: 160.0000 mg | ORAL_TABLET | Freq: Every day | ORAL | 0 refills | Status: DC

## 2017-04-11 ENCOUNTER — Other Ambulatory Visit: Payer: Self-pay

## 2017-04-11 MED ORDER — METOPROLOL TARTRATE 50 MG PO TABS: 50.0000 mg | ORAL_TABLET | Freq: Two times a day (BID) | ORAL | 1 refills | Status: DC

## 2017-04-11 MED ORDER — TERAZOSIN HCL 2 MG PO CAPS: 2.0000 mg | ORAL_CAPSULE | Freq: Every day | ORAL | 2 refills | Status: DC

## 2017-04-11 MED ORDER — ATORVASTATIN CALCIUM 40 MG PO TABS: 40.0000 mg | ORAL_TABLET | Freq: Every day | ORAL | 3 refills | Status: DC

## 2017-04-11 MED ORDER — FLUTICASONE PROPIONATE 50 MCG/ACT NA SUSP: 2.0000 | Freq: Every day | NASAL | 3 refills | Status: DC | PRN

## 2017-04-15 ENCOUNTER — Encounter: Payer: Self-pay | Admitting: Physician Assistant

## 2017-04-15 ENCOUNTER — Ambulatory Visit (INDEPENDENT_AMBULATORY_CARE_PROVIDER_SITE_OTHER): Payer: Medicare Other | Admitting: Physician Assistant

## 2017-04-15 ENCOUNTER — Ambulatory Visit (INDEPENDENT_AMBULATORY_CARE_PROVIDER_SITE_OTHER): Payer: Medicare Other

## 2017-04-15 ENCOUNTER — Encounter: Payer: Medicare Other | Admitting: Rehabilitative and Restorative Service Providers"

## 2017-04-15 VITALS — BP 113/75 | HR 76 | Temp 98.1°F | Wt 194.0 lb

## 2017-04-15 DIAGNOSIS — R05 Cough: Secondary | ICD-10-CM | POA: Diagnosis not present

## 2017-04-15 DIAGNOSIS — R059 Cough, unspecified: Secondary | ICD-10-CM

## 2017-04-15 DIAGNOSIS — J8410 Pulmonary fibrosis, unspecified: Secondary | ICD-10-CM

## 2017-04-15 DIAGNOSIS — I2510 Atherosclerotic heart disease of native coronary artery without angina pectoris: Secondary | ICD-10-CM | POA: Insufficient documentation

## 2017-04-15 DIAGNOSIS — R509 Fever, unspecified: Secondary | ICD-10-CM

## 2017-04-15 MED ORDER — DOXYCYCLINE HYCLATE 100 MG PO TABS: 100.0000 mg | ORAL_TABLET | Freq: Two times a day (BID) | ORAL | 0 refills | Status: AC

## 2017-04-15 MED ORDER — PREDNISONE 50 MG PO TABS: ORAL_TABLET | ORAL | 0 refills | Status: DC

## 2017-04-15 NOTE — Patient Instructions (Addendum)
- chest x-ray today - steroids for 5 days - antibiotics for 7 days   Upper Respiratory Infection, Adult Most upper respiratory infections (URIs) are a viral infection of the air passages leading to the lungs. A URI affects the nose, throat, and upper air passages. The most common type of URI is nasopharyngitis and is typically referred to as "the common cold." URIs run their course and usually go away on their own. Most of the time, a URI does not require medical attention, but sometimes a bacterial infection in the upper airways can follow a viral infection. This is called a secondary infection. Sinus and middle ear infections are common types of secondary upper respiratory infections. Bacterial pneumonia can also complicate a URI. A URI can worsen asthma and chronic obstructive pulmonary disease (COPD). Sometimes, these complications can require emergency medical care and may be life threatening. What are the causes? Almost all URIs are caused by viruses. A virus is a type of germ and can spread from one person to another. What increases the risk? You may be at risk for a URI if:  You smoke.  You have chronic heart or lung disease.  You have a weakened defense (immune) system.  You are very young or very old.  You have nasal allergies or asthma.  You work in crowded or poorly ventilated areas.  You work in health care facilities or schools.  What are the signs or symptoms? Symptoms typically develop 2-3 days after you come in contact with a cold virus. Most viral URIs last 7-10 days. However, viral URIs from the influenza virus (flu virus) can last 14-18 days and are typically more severe. Symptoms may include:  Runny or stuffy (congested) nose.  Sneezing.  Cough.  Sore throat.  Headache.  Fatigue.  Fever.  Loss of appetite.  Pain in your forehead, behind your eyes, and over your cheekbones (sinus pain).  Muscle aches.  How is this diagnosed? Your health care  provider may diagnose a URI by:  Physical exam.  Tests to check that your symptoms are not due to another condition such as: ? Strep throat. ? Sinusitis. ? Pneumonia. ? Asthma.  How is this treated? A URI goes away on its own with time. It cannot be cured with medicines, but medicines may be prescribed or recommended to relieve symptoms. Medicines may help:  Reduce your fever.  Reduce your cough.  Relieve nasal congestion.  Follow these instructions at home:  Take medicines only as directed by your health care provider.  Gargle warm saltwater or take cough drops to comfort your throat as directed by your health care provider.  Use a warm mist humidifier or inhale steam from a shower to increase air moisture. This may make it easier to breathe.  Drink enough fluid to keep your urine clear or pale yellow.  Eat soups and other clear broths and maintain good nutrition.  Rest as needed.  Return to work when your temperature has returned to normal or as your health care provider advises. You may need to stay home longer to avoid infecting others. You can also use a face mask and careful hand washing to prevent spread of the virus.  Increase the usage of your inhaler if you have asthma.  Do not use any tobacco products, including cigarettes, chewing tobacco, or electronic cigarettes. If you need help quitting, ask your health care provider. How is this prevented? The best way to protect yourself from getting a cold is to practice  good hygiene.  Avoid oral or hand contact with people with cold symptoms.  Wash your hands often if contact occurs.  There is no clear evidence that vitamin C, vitamin E, echinacea, or exercise reduces the chance of developing a cold. However, it is always recommended to get plenty of rest, exercise, and practice good nutrition. Contact a health care provider if:  You are getting worse rather than better.  Your symptoms are not controlled by  medicine.  You have chills.  You have worsening shortness of breath.  You have brown or red mucus.  You have yellow or brown nasal discharge.  You have pain in your face, especially when you bend forward.  You have a fever.  You have swollen neck glands.  You have pain while swallowing.  You have white areas in the back of your throat. Get help right away if:  You have severe or persistent: ? Headache. ? Ear pain. ? Sinus pain. ? Chest pain.  You have chronic lung disease and any of the following: ? Wheezing. ? Prolonged cough. ? Coughing up blood. ? A change in your usual mucus.  You have a stiff neck.  You have changes in your: ? Vision. ? Hearing. ? Thinking. ? Mood. This information is not intended to replace advice given to you by your health care provider. Make sure you discuss any questions you have with your health care provider. Document Released: 02/16/2001 Document Revised: 04/25/2016 Document Reviewed: 11/28/2013 Elsevier Interactive Patient Education  2017 [employer].

## 2017-04-15 NOTE — Progress Notes (Signed)
HPI:                                                                Todd Parsons is a 71 y.o. male who presents to [facility]: [place] today for eye redness and sinus congestion  Patient reports sinus congestion, postnasal drip and nonproductive cough x 3 days. Endorses low-grade fever of 99.6, chills, malaise, myalgias. Endorses purulent drainage and redness of his left eye. Denies shortness of breath. Has been using OTC cough suppressant and Afrin. History signifcant for bicuspid aortic valve s/p valvular replacement, CAD s/p CABG and pulmonary fibrosis.  Patient reports that grandchild was sick.  Past Medical History:  Diagnosis Date  . Arthritis    osteoarthritis  . Bicuspid aortic valve   . Coronary artery disease   . Dysrhythmia    atrial flutter  . Echocardiogram with ECG monitoring 01/12/2010   60-65% EF  . Hyperlipidemia   . Hypertension   . Pre-diabetes   . Sleep apnea    Past Surgical History:  Procedure Laterality Date  . A-FLUTTER ABLATION  2017  . ACROMIO-CLAVICULAR JOINT REPAIR Right 1980s      . apnea device implant     then removed.    Marland Kitchen CARDIAC CATHETERIZATION  07/2015  . CARDIAC VALVE REPLACEMENT  08/05/2015   Aortic valve  . CORONARY ARTERY BYPASS GRAFT  08/05/2015  . left knee arthoscopy    . NOSE SURGERY  late 50's   to fix a broken nose  . TONSILECTOMY, ADENOIDECTOMY, BILATERAL MYRINGOTOMY AND TUBES    . TOTAL KNEE ARTHROPLASTY Right 02/28/2017   Procedure: RIGHT TOTAL KNEE ARTHROPLASTY;  Surgeon: Dorna Leitz, MD;  Location: [facility];  Service: Orthopedics;  Laterality: Right;   Social History  Substance Use Topics  . Smoking status: Former Smoker    Quit date: 04/24/1978  . Smokeless tobacco: Never Used  . Alcohol use 1.2 oz/week    2 Standard drinks or equivalent per week     Comment: socially   family history includes Cancer in his mother; Heart disease (age of onset: 69) in his father; Stroke (age of  onset: 61) in his mother.  ROS: negative except as noted in the HPI  Medications: Current Outpatient Prescriptions  Medication Sig Dispense Refill  . Alcohol Swabs (B-D SINGLE USE SWABS REGULAR) PADS For use when testing glucose 100 each prn  . AMBULATORY NON FORMULARY MEDICATION Medication Name: glucometer, meter and strips to test 2 times per week. Dx: Diabetes Type 2. 1 vial 0  . amLODipine (NORVASC) 2.5 MG tablet Take 1 tablet (2.5 mg total) by mouth daily. 90 tablet 0  . aspirin EC 325 MG tablet Take 1 tablet (325 mg total) by mouth 2 (two) times daily after a meal. Take x 1 month post op to decrease risk of blood clots. (Patient taking differently: Take by mouth once. Take x 1 month post op to decrease risk of blood clots.) 60 tablet 0  . atorvastatin (LIPITOR) 40 MG tablet Take 1 tablet (40 mg total) by mouth at bedtime. 90 tablet 3  . Blood Glucose Calibration (ACCU-CHEK AVIVA) SOLN For use of calibration of meter 1 each prn  . fenofibrate 160 MG tablet Take 1 tablet (160 mg  total) by mouth daily. 90 tablet 0  . FLUoxetine (PROZAC) 20 MG capsule Take 1 capsule (20 mg total) by mouth daily. 30 capsule 0  . fluticasone (FLONASE) 50 MCG/ACT nasal spray Place 2 sprays into both nostrils daily as needed for allergies or rhinitis. 16 g 3  . metFORMIN (GLUCOPHAGE-XR) 500 MG 24 hr tablet Take 1 tablet (500 mg total) by mouth daily with breakfast. 30 tablet 0  . metoprolol tartrate (LOPRESSOR) 50 MG tablet Take 1 tablet (50 mg total) by mouth 2 (two) times daily. 180 tablet 1  . Multiple Vitamin (MULTIVITAMIN WITH MINERALS) TABS tablet Take 1 tablet by mouth daily.    Marland Kitchen oxybutynin (DITROPAN) 5 MG tablet Take 5 mg by mouth daily.    . predniSONE (DELTASONE) 50 MG tablet One tab PO daily for 5 days. 5 tablet 0  . terazosin (HYTRIN) 2 MG capsule Take 1 capsule (2 mg total) by mouth at bedtime. 90 capsule 2   No current facility-administered medications for this visit.    Allergies  Allergen  Reactions  . Simvastatin Other (See Comments)    Myalgias       Objective:  BP 113/75   Pulse 76   Temp 98.1 F (36.7 C)   Wt 194 lb (88 kg)   SpO2 97%   BMI 30.38 kg/m  Gen: well-groomed, cooperative, not ill-appearing, no distress HEENT: eyelids without edema or debris bilaterally, conjunctiva and cornea clear, no sceral injection, TM's clear bilaterally, nasal mucosa edematous, oropharynx clear, uvula midline, no tonsillar exudates, moist mucus membranes, no frontal or maxillary sinus tenderness, neck supple, trachea midline Pulm: Normal work of breathing, normal phonation, mild end ex CV: Normal rate, regular rhythm, s1 and s2 distinct, no murmurs, clicks or rubs  GI: abdomen soft, nondistended, nontender Neuro: alert and oriented x 3, EOM's intact, no tremor MSK: extremities atraumatic, normal gait and station, no peripheral edema Lymph: no cervical or tonsillar adenopathy Skin: warm, dry, intact; no rashes on exposed skin, no cyanosis  No results found for this or any previous visit (from the past 72 hour(s)). No results found.    Assessment and Plan: 71 y.o. male with   1. Cough in adult - given history of pulmonary fibrosis and heart disease will treat aggressively with antibiotics and steroids. Close follow-up in 6 days with PCP - DG Chest 2 View; Future - doxycycline (VIBRA-TABS) 100 MG tablet; Take 1 tablet (100 mg total) by mouth 2 (two) times daily.  Dispense: 14 tablet; Refill: 0 - predniSONE (DELTASONE) 50 MG tablet; One tab PO daily for 5 days.  Dispense: 5 tablet; Refill: 0  2. Pulmonary fibrosis, unspecified ([place]) - DG Chest 2 View; Future - referral to pulmonology  Patient education and anticipatory guidance given Patient agrees with treatment plan Follow-up in 6 days or sooner as needed if symptoms worsen or fail to improve  Darlyne Russian PA-C

## 2017-04-15 NOTE — Progress Notes (Signed)
Hi Trenten,  Your chest x-ray does not show any pneumonia or fluid on your lungs. However, compared to your last chest x-ray in January, it appears your pulmonary fibrosis is worsening. I am going to refer you to a pulmonologist/lung specialist.  I would still like you to do the antibiotics and steroids that were prescribed today. If you have any difficulty breathing over the weekend, please go to the emergency room.  Let me know if you have any questions or concerns.  Evlyn Clines

## 2017-04-19 ENCOUNTER — Ambulatory Visit (INDEPENDENT_AMBULATORY_CARE_PROVIDER_SITE_OTHER): Payer: Medicare Other | Admitting: Physical Therapy

## 2017-04-19 DIAGNOSIS — M6281 Muscle weakness (generalized): Secondary | ICD-10-CM | POA: Diagnosis not present

## 2017-04-19 DIAGNOSIS — M25661 Stiffness of right knee, not elsewhere classified: Secondary | ICD-10-CM

## 2017-04-19 DIAGNOSIS — M25561 Pain in right knee: Secondary | ICD-10-CM | POA: Diagnosis not present

## 2017-04-19 NOTE — Therapy (Addendum)
[address] [place], [place], [address] Phone: [phone]   Fax:  [phone]  Physical Therapy Treatment  Patient Details  Name: Todd Parsons MRN: [id number] Date of Birth: 05/11/1946 Referring Provider: Dr. Dorna Leitz  Encounter Date: 04/19/2017      PT End of Session - 04/19/17 1116    Visit Number 2   Number of Visits 12   Date for PT Re-Evaluation 05/18/17   PT Start Time 1020   PT Stop Time 1115  ice pack last 12 min    PT Time Calculation (min) 55 min   Activity Tolerance Patient tolerated treatment well   Behavior During Therapy [facility] for tasks assessed/performed      Past Medical History:  Diagnosis Date  . Arthritis    osteoarthritis  . Bicuspid aortic valve   . Coronary artery disease   . Dysrhythmia    atrial flutter  . Echocardiogram with ECG monitoring 01/12/2010   60-65% EF  . Hyperlipidemia   . Hypertension   . Pre-diabetes   . Sleep apnea     Past Surgical History:  Procedure Laterality Date  . A-FLUTTER ABLATION  2017  . ACROMIO-CLAVICULAR JOINT REPAIR Right 1980s      . apnea device implant     then removed.    Marland Kitchen CARDIAC CATHETERIZATION  07/2015  . CARDIAC VALVE REPLACEMENT  08/05/2015   Aortic valve  . CORONARY ARTERY BYPASS GRAFT  08/05/2015  . left knee arthoscopy    . NOSE SURGERY  late 50's   to fix a broken nose  . TONSILECTOMY, ADENOIDECTOMY, BILATERAL MYRINGOTOMY AND TUBES    . TOTAL KNEE ARTHROPLASTY Right 02/28/2017   Procedure: RIGHT TOTAL KNEE ARTHROPLASTY;  Surgeon: Dorna Leitz, MD;  Location: [facility];  Service: Orthopedics;  Laterality: Right;    There were no vitals filed for this visit.      Subjective Assessment - 04/19/17 1023    Subjective Pt reports things are going well with therapy. He is 6 wks post TKR. His main concern has been ROM for knee.  He states he gets a lot of exercise just moving around 4 story house.    Patient Stated Goals make  sure he is progressing well with ROM and strength - good improvement in the knee    Currently in Pain? No/denies   Pain Location None.             [facility] PT Assessment - 04/19/17 0001      Assessment   Medical Diagnosis Rt TKA   Referring Provider Dr. Dorna Leitz   Onset Date/Surgical Date 02/28/17   Hand Dominance Right   Next MD Visit 05/12/17     AROM   Right/Left Knee Right   Right Knee Extension -3  with quad set   Right Knee Flexion 121  heel slide      Flexibility   Quadriceps Rt knee: 117 deg.           [facility] Adult PT Treatment/Exercise - 04/19/17 0001      Self-Care   Self-Care Other Self-Care Comments   Other Self-Care Comments  Pt educated on self massage to Rt quad and to scar on anterior knee to decrease stiffness and decrease adhesions. Pt returned demo and verbalized understanding.      Knee/Hip Exercises: Stretches   Passive Hamstring Stretch 3 reps;30 seconds  supine w/ strap    Passive Hamstring Stretch Limitations shown 1 rep of seated  version    Quad Stretch 3 reps;30 seconds  prone w/ strap    Gastroc Stretch Right;Left;2 reps;30 seconds     Knee/Hip Exercises: Aerobic   Recumbent Bike L2: 6 min     Knee/Hip Exercises: Machines for Strengthening   Cybex Knee Extension 2 plates: up with BLE, down with RLE x 10 reps    Cybex Leg Press 5 plates x BLE x 5 reps, RLE x 5 reps     Knee/Hip Exercises: Supine   Quad Sets Strengthening;Right;1 set;5 reps  5 sec hold     Cryotherapy   Number Minutes Cryotherapy 12 Minutes   Cryotherapy Location Knee  Rt   Type of Cryotherapy Ice pack           PT Long Term Goals - 04/19/17 1050      PT LONG TERM GOAL #1   Title Normal gait pattern with no limp 05/18/17   Time 6   Status On-going     PT LONG TERM GOAL #2   Title Increase AROM Rt knee to 0 deg extension and 125 deg flexion 05/18/17   Time 6   Period Weeks   Status On-going     PT LONG TERM GOAL #3   Title 5/5 strength bilat LE's  05/18/17   Time 6   Period Weeks   Status On-going     PT LONG TERM GOAL #4   Title Independent in HEP 05/18/17   Time 6   Period Weeks   Status On-going     PT LONG TERM GOAL #5   Title Improve FOTO to </= 35% limitation 05/18/17   Time 6   Period Weeks   Status On-going               Plan - 04/19/17 1103    Clinical Impression Statement Pt demonstrated improved Rt knee ROM and quad flexibility.  He tolerated all exercises well without any production of pain. Pt is progressing well towards goals.    Rehab Potential Good   PT Frequency 2x / week   PT Duration 6 weeks   PT Treatment/Interventions Patient/family education;ADLs/Self Care Home Management;Cryotherapy;Electrical Stimulation;Iontophoresis '4mg'$ /ml Dexamethasone;Moist Heat;Ultrasound;Dry needling;Manual techniques;Therapeutic activities;Therapeutic exercise;Neuromuscular re-education;Gait training;Balance training   PT Next Visit Plan review HEP; progress with exercise with focus on full AROM Rt knee; strengthening; gait and endurance.    Consulted and Agree with Plan of Care Patient      Patient will benefit from skilled therapeutic intervention in order to improve the following deficits and impairments:  Postural dysfunction, Pain, Decreased strength, Decreased mobility, Decreased range of motion, Abnormal gait, Decreased activity tolerance, Decreased endurance  Visit Diagnosis: Stiffness of right knee, not elsewhere classified  Muscle weakness (generalized)  Acute pain of right knee     Problem List Patient Active Problem List   Diagnosis Date Noted  . CAD (coronary artery disease) 04/15/2017  . Primary osteoarthritis of right knee 02/28/2017  . Wears hearing aid 06/02/2016  . OAB (overactive bladder) 04/02/2016  . Primary osteoarthritis of both knees 02/10/2016  . Pulmonary fibrosis, unspecified ([place]) 12/22/2015  . Long term current use of anticoagulant therapy 10/17/2015  . H/O aortic valve  replacement 09/15/2015  . Atrial flutter ([place]) 09/15/2015  . IFG (impaired fasting glucose) 01/30/2015  . Restrictive airway disease 08/09/2014  . Hx of arthroscopy of knee 06/08/2011  . Essential hypertension, benign 05/05/2011  . Hyperlipidemia LDL goal <100 05/05/2011  . OSA (obstructive sleep apnea) 05/05/2011  . Aneurysm of  thoracic aorta ([place]) 05/05/2011  . Congenital insufficiency of aortic valve 05/05/2011  . History of bicuspid aortic valve 05/05/2011  . Vitamin D deficiency 05/05/2011   Kerin Perna, PTA 04/19/17 1:15 PM  [facility] [address] [place], [place], [address] Phone: [phone]   Fax:  [phone]  Name: Todd Parsons MRN: [id number] Date of Birth: 1946-06-06  PHYSICAL THERAPY DISCHARGE SUMMARY  Visits from Start of Care: 2  Current functional level related to goals / functional outcomes: See last progress note for discharge status    Remaining deficits: Unknown    Education / Equipment: HEP Plan: Patient agrees to discharge.  Patient goals were partially met. Patient is being discharged due to not returning since the last visit.  ?????     Celyn P. Helene Kelp PT, MPH 05/27/17 2:29 PM

## 2017-04-21 ENCOUNTER — Ambulatory Visit: Payer: Medicare Other | Admitting: Family Medicine

## 2017-04-22 ENCOUNTER — Encounter: Payer: Medicare Other | Admitting: Physical Therapy

## 2017-04-25 ENCOUNTER — Other Ambulatory Visit: Payer: Self-pay | Admitting: *Deleted

## 2017-04-25 MED ORDER — METFORMIN HCL ER 500 MG PO TB24: 500.0000 mg | ORAL_TABLET | Freq: Every day | ORAL | 1 refills | Status: DC

## 2017-04-29 ENCOUNTER — Encounter: Payer: Medicare Other | Admitting: Physical Therapy

## 2017-05-26 ENCOUNTER — Other Ambulatory Visit: Payer: Self-pay | Admitting: Family Medicine

## 2017-05-27 ENCOUNTER — Other Ambulatory Visit: Payer: Self-pay | Admitting: Family Medicine

## 2017-05-30 ENCOUNTER — Encounter: Payer: Self-pay | Admitting: Family Medicine

## 2017-05-30 ENCOUNTER — Ambulatory Visit (INDEPENDENT_AMBULATORY_CARE_PROVIDER_SITE_OTHER): Payer: Medicare Other | Admitting: Family Medicine

## 2017-05-30 VITALS — BP 109/62 | HR 57 | Ht 67.0 in | Wt 196.0 lb

## 2017-05-30 DIAGNOSIS — R7301 Impaired fasting glucose: Secondary | ICD-10-CM | POA: Diagnosis not present

## 2017-05-30 DIAGNOSIS — Z23 Encounter for immunization: Secondary | ICD-10-CM | POA: Diagnosis not present

## 2017-05-30 DIAGNOSIS — R05 Cough: Secondary | ICD-10-CM | POA: Diagnosis not present

## 2017-05-30 DIAGNOSIS — E119 Type 2 diabetes mellitus without complications: Secondary | ICD-10-CM | POA: Diagnosis not present

## 2017-05-30 DIAGNOSIS — I10 Essential (primary) hypertension: Secondary | ICD-10-CM | POA: Diagnosis not present

## 2017-05-30 DIAGNOSIS — R058 Other specified cough: Secondary | ICD-10-CM

## 2017-05-30 LAB — POCT GLYCOSYLATED HEMOGLOBIN (HGB A1C)

## 2017-05-30 MED ORDER — METFORMIN HCL ER 500 MG PO TB24: 1000.0000 mg | ORAL_TABLET | Freq: Every day | ORAL | 1 refills | Status: DC

## 2017-05-30 MED ORDER — FLUOXETINE HCL 20 MG PO CAPS: 20.0000 mg | ORAL_CAPSULE | Freq: Every day | ORAL | 3 refills | Status: DC

## 2017-05-30 NOTE — Progress Notes (Signed)
Subjective:    CC: BP, IFG  HPI:  Hypertension- Pt denies chest pain, SOB, dizziness, or heart palpitations.  Taking meds as directed w/o problems.  Denies medication side effects.    Impaired fasting glucose-no increased thirst or urination. No symptoms consistent with hypoglycemia.He admits he's been getting into some chips recently.  History of aortic valve replacement-cu Dr. Beverlyn Roux stopped his coumading aand adjuste his metoprolol.    He still been struggling with a dry cough some. He has known pulmonary fibrosis and has an appointment with pulmonologist coming up in November. He really feels like it's related to his sinuses and postnasal drip because if he takes a Claritin it completely clears up at least temporarily. He can taking 1 tab of Alka-Seltzer cold or even using Afrin occasionally again completely resolve his symptoms.  Mood-doing well on the fluoxetine. He would like a refill on the medications and to his pharmacy.  Past medical history, Surgical history, Family history not pertinant except as noted below, Social history, Allergies, and medications have been entered into the medical record, reviewed, and corrections made.   Review of Systems: No fevers, chills, night sweats, weight loss, chest pain, or shortness of breath.   Objective:    General: Well Developed, well nourished, and in no acute distress.  Neuro: Alert and oriented x3, extra-ocular muscles intact, sensation grossly intact.  HEENT: Normocephalic, atraumatic  Skin: Warm and dry, no rashes. Cardiac: Regular rate and rhythm, no murmurs rubs or gallops, no lower extremity edema.  Respiratory: Clear to auscultation bilaterally. Not using accessory muscles, speaking in full sentences.   Impression and Recommendations:    HTN - Well controlled. Continue current regimen. Follow up in  6 months.    H/X of aortic vavle assessment-currently off of Coumadin but does take an aspirin 325 daily.  DM  - A1C is  up  just slightly from previous. Hemoglobin A1c of 6.5 today.. Continue current regimen. Follow up in  6 months.  Will increase metformin to twice a day. F/U in 3-4 months.   Mood - stable. Would like a refill on his "happy pill".  Medication refilled.  Dry cough-likely related to postnasal drip. Okay to take Claritin daily. This precludes safe to do seasonally.  High-dose flu vaccine given today.

## 2017-06-24 ENCOUNTER — Other Ambulatory Visit: Payer: Self-pay | Admitting: Family Medicine

## 2017-07-07 DIAGNOSIS — M25561 Pain in right knee: Secondary | ICD-10-CM | POA: Diagnosis not present

## 2017-07-21 ENCOUNTER — Encounter: Payer: Self-pay | Admitting: Pulmonary Disease

## 2017-07-21 ENCOUNTER — Ambulatory Visit: Payer: Medicare Other | Admitting: Pulmonary Disease

## 2017-07-21 VITALS — BP 114/74 | HR 56 | Ht 67.0 in | Wt 193.2 lb

## 2017-07-21 DIAGNOSIS — J8410 Pulmonary fibrosis, unspecified: Secondary | ICD-10-CM | POA: Diagnosis not present

## 2017-07-21 NOTE — Patient Instructions (Signed)
You have scar tissue in her lungs that has been noted on previous CT scans. Schedule high-resolution CT scan of the chest. Schedule PFTs - call us if you obtain blood work prior to next visit

## 2017-07-21 NOTE — Progress Notes (Signed)
   Subjective:    Patient ID: Todd Parsons, male    DOB: [birth year], 71 y.o.   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  He smoked as a teenager-about 17 pack years before he quit at age 67.  He was evaluated in 2016, noted to have restriction on spirometry and asked to obtain a high-resolution CT scan.  However he was lost to follow-up. In the interim he underwent aortic valve replacement , CABG and repair of thoracic aortic aneurysm at [facility], uneventful postop course.  Also underwent knee replacement and feels that this has pulled him down a little bit. Denies dyspnea has not brought him back on the treadmill onto his Zumba class. He was referred back again due to chest x-ray showing bibasilar infiltrates from 04/2017 which I reviewed. Review of his prior CT scans from [facility] also makes mention of interstitial fibrosis He denies joint pains Or other occupational exposure   Significant tests/ events reviewed  2016 Spirometry >> no  airway obstruction, FEV1 was 79%, FVC was 70%.  Chest x-ray-shows, mild interstitial scarring especially at the bases   Past Medical History:  Diagnosis Date  . Arthritis    osteoarthritis  . Bicuspid aortic valve   . Coronary artery disease   . Dysrhythmia    atrial flutter  . Echocardiogram with ECG monitoring 01/12/2010   60-65% EF  . Hyperlipidemia   . Hypertension   . Pre-diabetes   . Sleep apnea      Review of Systems neg for any significant sore throat, dysphagia, itching, sneezing, nasal congestion or excess/ purulent secretions, fever, chills, sweats, unintended wt loss, pleuritic or exertional cp, hempoptysis, orthopnea pnd or change in chronic leg swelling. Also denies presyncope, palpitations, heartburn, abdominal pain, nausea, vomiting, diarrhea or change in bowel or urinary habits, dysuria,hematuria, rash, arthralgias,  visual complaints, headache, numbness weakness or ataxia.     Objective:   Physical Exam   Gen. Pleasant, well-nourished, in no distress, normal affect ENT - no lesions, no post nasal drip Neck: No JVD, no thyromegaly, no carotid bruits Lungs: no use of accessory muscles, no dullness to percussion, bibasal 1/3 velcro rales ,no rhonchi  Cardiovascular: Rhythm regular, heart sounds  normal, no murmurs or gallops, no peripheral edema Abdomen: soft and non-tender, no hepatosplenomegaly, BS normal. Musculoskeletal: No deformities, no cyanosis or clubbing Neuro:  alert, non focal        Assessment & Plan:

## 2017-07-21 NOTE — Addendum Note (Signed)
Addended by: Valerie Salts on: 07/21/2017 02:56 PM   Modules accepted: Orders

## 2017-07-21 NOTE — Assessment & Plan Note (Signed)
With bibasilar crackles, restriction on spirometry and mention of interstitial fibrosis on prior CT scan, I would be concerned about IPF in this age group  Schedule high-resolution CT scan of the chest. Schedule PFTs - call us if you obtain blood work prior to next visit If so we will obtain his ESR, LFTs, CCP, ANA and anti-Jo

## 2017-07-22 ENCOUNTER — Telehealth: Payer: Self-pay | Admitting: Pulmonary Disease

## 2017-07-22 ENCOUNTER — Ambulatory Visit (INDEPENDENT_AMBULATORY_CARE_PROVIDER_SITE_OTHER): Payer: Medicare Other

## 2017-07-22 DIAGNOSIS — R918 Other nonspecific abnormal finding of lung field: Secondary | ICD-10-CM | POA: Diagnosis not present

## 2017-07-22 DIAGNOSIS — R590 Localized enlarged lymph nodes: Secondary | ICD-10-CM | POA: Diagnosis not present

## 2017-07-22 DIAGNOSIS — I10 Essential (primary) hypertension: Secondary | ICD-10-CM | POA: Diagnosis not present

## 2017-07-22 DIAGNOSIS — I700 Atherosclerosis of aorta: Secondary | ICD-10-CM

## 2017-07-22 DIAGNOSIS — J8410 Pulmonary fibrosis, unspecified: Secondary | ICD-10-CM

## 2017-07-22 DIAGNOSIS — R7301 Impaired fasting glucose: Secondary | ICD-10-CM | POA: Diagnosis not present

## 2017-07-22 LAB — COMPLETE METABOLIC PANEL WITH GFR
AG Ratio: 1.7 (calc) (ref 1.0–2.5)
ALBUMIN MSPROF: 4.3 g/dL (ref 3.6–5.1)
ALKALINE PHOSPHATASE (APISO): 49 U/L (ref 40–115)
ALT: 19 U/L (ref 9–46)
AST: 23 U/L (ref 10–35)
BILIRUBIN TOTAL: 0.8 mg/dL (ref 0.2–1.2)
BUN: 19 mg/dL (ref 7–25)
CHLORIDE: 104 mmol/L (ref 98–110)
CO2: 30 mmol/L (ref 20–32)
Calcium: 9.5 mg/dL (ref 8.6–10.3)
Creat: 1.13 mg/dL (ref 0.70–1.18)
GFR, Est African American: 75 mL/min/{1.73_m2} (ref >=60)
GFR, Est Non African American: 65 mL/min/{1.73_m2} (ref >=60)
GLOBULIN: 2.5 g/dL (ref 1.9–3.7)
Glucose, Bld: 100 mg/dL — ABNORMAL HIGH (ref 65–99)
POTASSIUM: 4.5 mmol/L (ref 3.5–5.3)
SODIUM: 140 mmol/L (ref 135–146)
Total Protein: 6.8 g/dL (ref 6.1–8.1)

## 2017-07-22 LAB — HEPATIC FUNCTION PANEL
AG Ratio: 1.9 (calc) (ref 1.0–2.5)
ALT: 18 U/L (ref 9–46)
AST: 22 U/L (ref 10–35)
Albumin: 4.3 g/dL (ref 3.6–5.1)
Alkaline phosphatase (APISO): 49 U/L (ref 40–115)
BILIRUBIN DIRECT: 0.2 mg/dL (ref 0.0–0.2)
BILIRUBIN INDIRECT: 0.6 mg/dL (ref 0.2–1.2)
BILIRUBIN TOTAL: 0.8 mg/dL (ref 0.2–1.2)
GLOBULIN: 2.3 g/dL (ref 1.9–3.7)
Total Protein: 6.6 g/dL (ref 6.1–8.1)

## 2017-07-22 LAB — LIPID PANEL W/REFLEX DIRECT LDL
CHOL/HDL RATIO: 3.3 (calc) (ref <5.0)
Cholesterol: 115 mg/dL (ref <200)
HDL: 35 mg/dL — ABNORMAL LOW (ref >40)
LDL CHOLESTEROL (CALC): 60 mg/dL
Non-HDL Cholesterol (Calc): 80 mg/dL (calc) (ref <130)
TRIGLYCERIDES: 123 mg/dL (ref <150)

## 2017-07-22 NOTE — Progress Notes (Signed)
All labs are normal. 

## 2017-07-22 NOTE — Addendum Note (Signed)
Addended by: Valerie Salts on: 07/22/2017 09:38 AM   Modules accepted: Orders

## 2017-07-22 NOTE — Telephone Encounter (Signed)
Faxed lab orders to [facility]. Advised pt.

## 2017-08-24 ENCOUNTER — Ambulatory Visit (INDEPENDENT_AMBULATORY_CARE_PROVIDER_SITE_OTHER): Payer: Medicare Other | Admitting: Pulmonary Disease

## 2017-08-24 ENCOUNTER — Ambulatory Visit: Payer: Medicare Other | Admitting: Pulmonary Disease

## 2017-08-24 ENCOUNTER — Other Ambulatory Visit (INDEPENDENT_AMBULATORY_CARE_PROVIDER_SITE_OTHER): Payer: Medicare Other

## 2017-08-24 ENCOUNTER — Encounter: Payer: Self-pay | Admitting: Pulmonary Disease

## 2017-08-24 VITALS — BP 114/74 | HR 51 | Ht 67.0 in | Wt 190.0 lb

## 2017-08-24 DIAGNOSIS — J84112 Idiopathic pulmonary fibrosis: Secondary | ICD-10-CM

## 2017-08-24 DIAGNOSIS — J8410 Pulmonary fibrosis, unspecified: Secondary | ICD-10-CM

## 2017-08-24 LAB — PULMONARY FUNCTION TEST
DL/VA % PRED: 100 %
DL/VA: 4.41 ml/min/mmHg/L
DLCO COR % PRED: 54 %
DLCO COR: 15.48 ml/min/mmHg
DLCO UNC % PRED: 55 %
DLCO unc: 15.57 ml/min/mmHg
FEF 25-75 POST: 3.02 L/s
FEF 25-75 PRE: 3.49 L/s
FEF2575-%CHANGE-POST: -13 %
FEF2575-%PRED-POST: 145 %
FEF2575-%PRED-PRE: 167 %
FEV1-%Change-Post: 0 %
FEV1-%PRED-PRE: 67 %
FEV1-%Pred-Post: 67 %
FEV1-Post: 1.89 L
FEV1-Pre: 1.88 L
FEV1FVC-%CHANGE-POST: 2 %
FEV1FVC-%PRED-PRE: 125 %
FEV6-%CHANGE-POST: -1 %
FEV6-%PRED-POST: 56 %
FEV6-%Pred-Pre: 56 %
FEV6-Post: 2.02 L
FEV6-Pre: 2.05 L
FEV6FVC-%Pred-Post: 106 %
FEV6FVC-%Pred-Pre: 106 %
FVC-%Change-Post: -1 %
FVC-%Pred-Post: 52 %
FVC-%Pred-Pre: 53 %
FVC-Post: 2.02 L
FVC-Pre: 2.05 L
POST FEV1/FVC RATIO: 94 %
PRE FEV1/FVC RATIO: 92 %
Post FEV6/FVC ratio: 100 %
Pre FEV6/FVC Ratio: 100 %
RV % pred: 66 %
RV: 1.55 L
TLC % pred: 56 %
TLC: 3.62 L

## 2017-08-24 LAB — SEDIMENTATION RATE: Sed Rate: 3 mm/hr (ref 0–20)

## 2017-08-24 NOTE — Patient Instructions (Signed)
Blood work today.  We discussed the difference between hypersensitivity pneumonitis and idiopathic pulmonary fibrosis as possible diagnoses.  Consider bronchoscopy with biopsy  We settled on observation for now. Start aerobic exercise-let me know if you are interested in pulmonary rehab program at [facility]

## 2017-08-24 NOTE — Progress Notes (Signed)
PFT completed today 08/24/17  

## 2017-08-24 NOTE — Assessment & Plan Note (Signed)
We discussed the difference between hypersensitivity pneumonitis and idiopathic pulmonary fibrosis as possible diagnoses.  Consider bronchoscopy with biopsy -he will get back to me if he wants to proceed, discussed the risks and low yield for diagnosis  We settled on observation for now rather than empiric treatment with prednisone Start aerobic exercise-let me know if you are interested in pulmonary rehab program at [facility]    shared decision making -- Greater than 50% time was spent in counseling and coordination of care with the patient

## 2017-08-24 NOTE — Progress Notes (Signed)
   Subjective:    Patient ID: Todd Parsons, male    DOB: 1946-01-12, 71 y.o.   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  He smoked as a teenager-about 17 pack years before he quit at age 24.  He was evaluated in 2016, noted to have restriction on spirometry and asked to obtain a high-resolution CT scan.  In 2017, he underwent aortic valve replacement , CABG and repair of thoracic aortic aneurysm at [facility], uneventful postop course.  Also underwent knee replacement . He has had a sedentary lifestyle over the last 2 years.  we reviewed HRCT PFTs today. Unfortunately he did not get blood work done Except LFTs which were normal  HRCT was felt to favor chronic hypersensitivity pneumonitis, had apical as well as basal infiltrates and absence of honeycombing  PFTs surprisingly showed severe restriction with FVC 53%, TLC 56% and DLCO 54%  Does not have joint pains or skin rash  Significant tests/ events reviewed  2016 Spirometry >> no  airway obstruction, FEV1 was 79%, FVC was 70%.  Chest x-ray-shows, mild interstitial scarring especially at the bases Review of his prior CT scans from [facility] also makes mention of interstitial fibrosis   Review of Systems neg for any significant sore throat, dysphagia, itching, sneezing, nasal congestion or excess/ purulent secretions, fever, chills, sweats, unintended wt loss, pleuritic or exertional cp, hempoptysis, orthopnea pnd or change in chronic leg swelling. Also denies presyncope, palpitations, heartburn, abdominal pain, nausea, vomiting, diarrhea or change in bowel or urinary habits, dysuria,hematuria, rash, arthralgias, visual complaints, headache, numbness weakness or ataxia.     Objective:   Physical Exam   Gen. Pleasant, well-nourished, in no distress, normal affect ENT - no lesions, no post nasal drip Neck: No JVD, no  thyromegaly, no carotid bruits Lungs: no use of accessory muscles, no dullness to percussion, bibasal rales, no rhonchi  Cardiovascular: Rhythm regular, heart sounds  normal, no murmurs or gallops, no peripheral edema Abdomen: soft and non-tender, no hepatosplenomegaly, BS normal. Musculoskeletal: No deformities, no cyanosis or clubbing Neuro:  alert, non focal        Assessment & Plan:

## 2017-08-30 LAB — CK TOTAL AND CKMB (NOT AT ARMC)
CK TOTAL: 138 U/L (ref 44–196)
CK, MB: 1.7 ng/mL (ref 0–5.0)
RELATIVE INDEX: 1.2 (ref 0–4.0)

## 2017-08-30 LAB — HYPERSENSITIVITY PNUEMONITIS PROFILE
ASPERGILLUS FUMIGATUS: NEGATIVE
FAENIA RETIVIRGULA: NEGATIVE
PIGEON SERUM: NEGATIVE
S. VIRIDIS: NEGATIVE
T. CANDIDUS: NEGATIVE
T. VULGARIS: NEGATIVE

## 2017-08-30 LAB — ANA: Anti Nuclear Antibody(ANA): NEGATIVE

## 2017-08-30 LAB — CYCLIC CITRUL PEPTIDE ANTIBODY, IGG

## 2017-09-08 DIAGNOSIS — I10 Essential (primary) hypertension: Secondary | ICD-10-CM | POA: Diagnosis not present

## 2017-09-08 DIAGNOSIS — Z0100 Encounter for examination of eyes and vision without abnormal findings: Secondary | ICD-10-CM | POA: Diagnosis not present

## 2017-09-10 ENCOUNTER — Other Ambulatory Visit: Payer: Self-pay | Admitting: Family Medicine

## 2017-09-29 ENCOUNTER — Encounter: Payer: Self-pay | Admitting: Family Medicine

## 2017-09-29 ENCOUNTER — Ambulatory Visit (INDEPENDENT_AMBULATORY_CARE_PROVIDER_SITE_OTHER): Payer: Medicare HMO | Admitting: Family Medicine

## 2017-09-29 ENCOUNTER — Other Ambulatory Visit: Payer: Self-pay | Admitting: Family Medicine

## 2017-09-29 VITALS — BP 120/70 | HR 65 | Ht 67.0 in | Wt 193.0 lb

## 2017-09-29 DIAGNOSIS — I712 Thoracic aortic aneurysm, without rupture, unspecified: Secondary | ICD-10-CM

## 2017-09-29 DIAGNOSIS — I10 Essential (primary) hypertension: Secondary | ICD-10-CM | POA: Diagnosis not present

## 2017-09-29 DIAGNOSIS — E119 Type 2 diabetes mellitus without complications: Secondary | ICD-10-CM

## 2017-09-29 DIAGNOSIS — E785 Hyperlipidemia, unspecified: Secondary | ICD-10-CM

## 2017-09-29 DIAGNOSIS — J8410 Pulmonary fibrosis, unspecified: Secondary | ICD-10-CM | POA: Diagnosis not present

## 2017-09-29 LAB — POCT UA - MICROALBUMIN
Albumin/Creatinine Ratio, Urine, POC: <30
CREATININE, POC: 200 mg/dL
Microalbumin Ur, POC: 30 mg/L

## 2017-09-29 LAB — POCT GLYCOSYLATED HEMOGLOBIN (HGB A1C): HEMOGLOBIN A1C: 6.1

## 2017-09-29 NOTE — Progress Notes (Signed)
Subjective:    CC: DM, BP, pul fibrosiss  HPI:  Diabetes - no hypoglycemic events. No wounds or sores that are not healing well. No increased thirst or urination. Checking glucose at home. Taking medications as prescribed without any side effects. Last him 11 A1c was elevated to 6.5 and we adjusted his metformin. Is now taking 2 tabs daily.  Hypertension- Pt denies chest pain, SOB, dizziness, or heart palpitations.  Taking meds as directed w/o problems.  Denies medication side effects.    Pulmonary fibrosis - Following with Dr. Elsworth Soho. Recently had a chest CT. He did have a breathing test as well but admits he wasn't quite as clear on the instructions. He wasn't taking a full breath and before breathing out. They have rescheduled him in about 3 months to repeat the tests which I think is worthwhile doing to get a true better baseline is it likely did affect his FVC.    Past medical history, Surgical history, Family history not pertinant except as noted below, Social history, Allergies, and medications have been entered into the medical record, reviewed, and corrections made.   Review of Systems: No fevers, chills, night sweats, weight loss, chest pain, or shortness of breath.   Objective:    General: Well Developed, well nourished, and in no acute distress.  Neuro: Alert and oriented x3, extra-ocular muscles intact, sensation grossly intact.  HEENT: Normocephalic, atraumatic  Skin: Warm and dry, no rashes. Cardiac: Regular rate and rhythm, no murmurs rubs or gallops, no lower extremity edema.  Respiratory: Clear to auscultation bilaterally. Not using accessory muscles, speaking in full sentences.   Impression and Recommendations:    DM - A1C of 6.9 today.  currently on a statin and aspirin.  Urine microalbumin  performed today.  HTN -  Well controlled. Continue current regimen. Follow up in  4 months.    Pulmonary fibrosis - follows with Dr. Eartha Inch. Last chest CT was November 2018. It  did show some fibrotic interstitial lung disease as well as moderate patchy air-trapping. It was most consistent with chronic hypersensitivity pneumonitis. Due for follow-up CT in one year, November 2019.

## 2017-09-29 NOTE — Progress Notes (Signed)
Pt requesting to go to [facility].

## 2017-09-30 DIAGNOSIS — J8410 Pulmonary fibrosis, unspecified: Secondary | ICD-10-CM | POA: Diagnosis not present

## 2017-09-30 DIAGNOSIS — I10 Essential (primary) hypertension: Secondary | ICD-10-CM | POA: Diagnosis not present

## 2017-09-30 DIAGNOSIS — E785 Hyperlipidemia, unspecified: Secondary | ICD-10-CM | POA: Diagnosis not present

## 2017-09-30 DIAGNOSIS — E119 Type 2 diabetes mellitus without complications: Secondary | ICD-10-CM | POA: Diagnosis not present

## 2017-10-01 LAB — CBC WITH DIFFERENTIAL/PLATELET
BASOS: 0 %
Basophils Absolute: 0 10*3/uL (ref 0.0–0.2)
EOS (ABSOLUTE): 0.5 10*3/uL — AB (ref 0.0–0.4)
EOS: 6 %
HEMATOCRIT: 42.7 % (ref 37.5–51.0)
Hemoglobin: 14.8 g/dL (ref 13.0–17.7)
IMMATURE GRANS (ABS): 0 10*3/uL (ref 0.0–0.1)
IMMATURE GRANULOCYTES: 0 %
LYMPHS: 37 %
Lymphocytes Absolute: 2.9 10*3/uL (ref 0.7–3.1)
MCH: 29.4 pg (ref 26.6–33.0)
MCHC: 34.7 g/dL (ref 31.5–35.7)
MCV: 85 fL (ref 79–97)
Monocytes Absolute: 0.9 10*3/uL (ref 0.1–0.9)
Monocytes: 12 %
NEUTROS PCT: 45 %
Neutrophils Absolute: 3.6 10*3/uL (ref 1.4–7.0)
PLATELETS: 225 10*3/uL (ref 150–379)
RBC: 5.03 x10E6/uL (ref 4.14–5.80)
RDW: 14 % (ref 12.3–15.4)
WBC: 7.9 10*3/uL (ref 3.4–10.8)

## 2017-10-01 LAB — LIPID PANEL
CHOLESTEROL TOTAL: 113 mg/dL (ref 100–199)
Chol/HDL Ratio: 3.1 ratio (ref 0.0–5.0)
HDL: 36 mg/dL — ABNORMAL LOW (ref >39)
LDL Calculated: 54 mg/dL (ref 0–99)
Triglycerides: 113 mg/dL (ref 0–149)
VLDL Cholesterol Cal: 23 mg/dL (ref 5–40)

## 2017-10-01 LAB — COMPREHENSIVE METABOLIC PANEL
ALK PHOS: 54 IU/L (ref 39–117)
ALT: 23 IU/L (ref 0–44)
AST: 32 IU/L (ref 0–40)
Albumin/Globulin Ratio: 1.6 (ref 1.2–2.2)
Albumin: 4.3 g/dL (ref 3.5–4.8)
BUN/Creatinine Ratio: 15 (ref 10–24)
BUN: 17 mg/dL (ref 8–27)
Bilirubin Total: 0.5 mg/dL (ref 0.0–1.2)
CO2: 25 mmol/L (ref 20–29)
CREATININE: 1.16 mg/dL (ref 0.76–1.27)
Calcium: 9.7 mg/dL (ref 8.6–10.2)
Chloride: 100 mmol/L (ref 96–106)
GFR calc Af Amer: 72 mL/min/{1.73_m2} (ref >59)
GFR calc non Af Amer: 63 mL/min/{1.73_m2} (ref >59)
GLUCOSE: 103 mg/dL — AB (ref 65–99)
Globulin, Total: 2.7 g/dL (ref 1.5–4.5)
Potassium: 5 mmol/L (ref 3.5–5.2)
Sodium: 140 mmol/L (ref 134–144)
Total Protein: 7 g/dL (ref 6.0–8.5)

## 2017-10-03 NOTE — Progress Notes (Signed)
All labs are normal. 

## 2017-10-06 ENCOUNTER — Other Ambulatory Visit: Payer: Self-pay | Admitting: Family Medicine

## 2017-10-06 DIAGNOSIS — E119 Type 2 diabetes mellitus without complications: Secondary | ICD-10-CM

## 2017-11-11 ENCOUNTER — Other Ambulatory Visit: Payer: Self-pay | Admitting: Family Medicine

## 2017-11-11 DIAGNOSIS — E119 Type 2 diabetes mellitus without complications: Secondary | ICD-10-CM

## 2018-01-26 ENCOUNTER — Ambulatory Visit: Payer: Medicare HMO | Admitting: Family Medicine

## 2018-02-03 ENCOUNTER — Encounter: Payer: Self-pay | Admitting: Family Medicine

## 2018-02-03 ENCOUNTER — Ambulatory Visit: Payer: Medicare HMO | Admitting: Family Medicine

## 2018-02-03 ENCOUNTER — Ambulatory Visit (INDEPENDENT_AMBULATORY_CARE_PROVIDER_SITE_OTHER): Payer: Medicare HMO | Admitting: Family Medicine

## 2018-02-03 ENCOUNTER — Other Ambulatory Visit: Payer: Self-pay | Admitting: Family Medicine

## 2018-02-03 VITALS — BP 102/72 | HR 52 | Ht 67.0 in | Wt 188.0 lb

## 2018-02-03 DIAGNOSIS — E1159 Type 2 diabetes mellitus with other circulatory complications: Secondary | ICD-10-CM

## 2018-02-03 DIAGNOSIS — I10 Essential (primary) hypertension: Secondary | ICD-10-CM

## 2018-02-03 DIAGNOSIS — R400 Somnolence: Secondary | ICD-10-CM

## 2018-02-03 DIAGNOSIS — J8410 Pulmonary fibrosis, unspecified: Secondary | ICD-10-CM | POA: Diagnosis not present

## 2018-02-03 LAB — POCT GLYCOSYLATED HEMOGLOBIN (HGB A1C): HEMOGLOBIN A1C: 5.9 % — AB (ref 4.0–5.6)

## 2018-02-03 NOTE — Progress Notes (Signed)
Subjective:    CC: HTN, DM  HPI:  Hypertension- Pt denies chest pain, SOB, dizziness, or heart palpitations.  Taking meds as directed w/o problems.  Denies medication side effects.    Diabetes - no hypoglycemic events. No wounds or sores that are not healing well. No increased thirst or urination. Checking glucose at home. Taking medications as prescribed without any side effects.  Pulmonary fibrosis -he says he does notice a little shortness of breath going upstairs.  He does have a diagnosis of pulmonary fibrosis and follows with Dr. Elsworth Soho.  They have opted not to do anything diagnostic at this point such as bronchoscopy.  He is not exercising right now.  His wife is also here with him today and is concerned because he often takes 1 or 2 naps almost every day.  Sleep is fair at night.  Sometimes he does wake up a couple times to urinate and sometimes he cannot sleep well his so he will get up and read a book until he feels sleepy again.  He did have borderline sleep apnea years ago and actually was in a trial study.  But his wife says his snoring has actually gotten better over the last several years.   Past medical history, Surgical history, Family history not pertinant except as noted below, Social history, Allergies, and medications have been entered into the medical record, reviewed, and corrections made.   Review of Systems: No fevers, chills, night sweats, weight loss, chest pain, or shortness of breath.   Objective:    General: Well Developed, well nourished, and in no acute distress.  Neuro: Alert and oriented x3, extra-ocular muscles intact, sensation grossly intact.  HEENT: Normocephalic, atraumatic  Skin: Warm and dry, no rashes. Cardiac: Regular rate and rhythm, no murmurs rubs or gallops, no lower extremity edema.  Respiratory: Clear to auscultation bilaterally. Not using accessory muscles, speaking in full sentences.   Impression and Recommendations:    HTN - Well  controlled. Continue current regimen. Follow up in  4 months.   DM - On ASA and statin. Not on an ACE.  Will control with hemoglobin A1c of 5.9 today.  Continue current regimen.  Follow-up in 4 months.  Due for foot exam and eye exam.  Pulmonary fibrosis -follows with Dr. Elsworth Soho.  Due for follow-up soon.  Encouraged him to get back into the gym and really start doing his cardio again.  I think this will make an improvement with his shortness of breath and his stamina.  And even Dr. Elsworth Soho had recommended pulmonary rehab.  Daytime sleepiness-Try moving fluoxetine to bedtime.  Try this for a few weeks and see if this helps with the daytime sleepiness.  If not then we can try holding the amlodipine for 2 weeks and see if that makes a difference.  If it does not then please restart the amlodipine and then give me a call.

## 2018-02-03 NOTE — Patient Instructions (Signed)
Try moving fluoxetine to bedtime.  Try this for a few weeks and see if this helps with the daytime sleepiness.  If not then we can try holding the amlodipine for 2 weeks and see if that makes a difference.  If it does not then please restart the amlodipine and then give me a call.

## 2018-02-04 LAB — BASIC METABOLIC PANEL
BUN/Creatinine Ratio: 15 (ref 10–24)
BUN: 16 mg/dL (ref 8–27)
CO2: 24 mmol/L (ref 20–29)
CREATININE: 1.08 mg/dL (ref 0.76–1.27)
Calcium: 10 mg/dL (ref 8.6–10.2)
Chloride: 102 mmol/L (ref 96–106)
GFR calc Af Amer: 79 mL/min/{1.73_m2} (ref >59)
GFR, EST NON AFRICAN AMERICAN: 68 mL/min/{1.73_m2} (ref >59)
GLUCOSE: 97 mg/dL (ref 65–99)
POTASSIUM: 4.3 mmol/L (ref 3.5–5.2)
SODIUM: 139 mmol/L (ref 134–144)

## 2018-02-05 NOTE — Progress Notes (Signed)
All labs are normal. 

## 2018-02-15 DIAGNOSIS — I2510 Atherosclerotic heart disease of native coronary artery without angina pectoris: Secondary | ICD-10-CM | POA: Diagnosis not present

## 2018-02-15 DIAGNOSIS — I10 Essential (primary) hypertension: Secondary | ICD-10-CM | POA: Diagnosis not present

## 2018-02-15 DIAGNOSIS — Z951 Presence of aortocoronary bypass graft: Secondary | ICD-10-CM | POA: Diagnosis not present

## 2018-02-15 DIAGNOSIS — Z952 Presence of prosthetic heart valve: Secondary | ICD-10-CM | POA: Diagnosis not present

## 2018-05-23 ENCOUNTER — Other Ambulatory Visit: Payer: Self-pay | Admitting: Family Medicine

## 2018-05-23 DIAGNOSIS — E119 Type 2 diabetes mellitus without complications: Secondary | ICD-10-CM

## 2018-06-05 ENCOUNTER — Ambulatory Visit: Payer: Medicare HMO | Admitting: Family Medicine

## 2018-06-13 ENCOUNTER — Encounter: Payer: Self-pay | Admitting: Family Medicine

## 2018-06-13 ENCOUNTER — Ambulatory Visit (INDEPENDENT_AMBULATORY_CARE_PROVIDER_SITE_OTHER): Payer: Medicare HMO | Admitting: Family Medicine

## 2018-06-13 VITALS — BP 118/73 | HR 56 | Ht 67.0 in | Wt 190.0 lb

## 2018-06-13 DIAGNOSIS — N3281 Overactive bladder: Secondary | ICD-10-CM | POA: Diagnosis not present

## 2018-06-13 DIAGNOSIS — E119 Type 2 diabetes mellitus without complications: Secondary | ICD-10-CM | POA: Diagnosis not present

## 2018-06-13 DIAGNOSIS — Z23 Encounter for immunization: Secondary | ICD-10-CM | POA: Diagnosis not present

## 2018-06-13 DIAGNOSIS — I10 Essential (primary) hypertension: Secondary | ICD-10-CM

## 2018-06-13 DIAGNOSIS — E1159 Type 2 diabetes mellitus with other circulatory complications: Secondary | ICD-10-CM | POA: Diagnosis not present

## 2018-06-13 LAB — POCT GLYCOSYLATED HEMOGLOBIN (HGB A1C): HEMOGLOBIN A1C: 6 % — AB (ref 4.0–5.6)

## 2018-06-13 MED ORDER — METFORMIN HCL ER 500 MG PO TB24: 1000.0000 mg | ORAL_TABLET | Freq: Every day | ORAL | 3 refills | Status: DC

## 2018-06-13 NOTE — Progress Notes (Signed)
Subjective:    CC: BP, HTN  HPI:  Hypertension- Pt denies chest pain, SOB, dizziness, or heart palpitations.  Taking meds as directed w/o problems.  Denies medication side effects.    Diabetes - no hypoglycemic events. No wounds or sores that are not healing well. No increased thirst or urination. Not checking glucose at home. Taking medications as prescribed without any side effects.  He has been doubling up on his metformin so ran out early.  Is been tolerating that well without any GI side effects which is great.    Overactive bladder-doing well overall he is currently on oxybutynin 5 mg twice a day.  Coronary artery disease he actually just had a follow-up with Dr. Mauricio Po in June.  Did not make any changes to his regimen.  He is going to be scheduled for a stress echo in June 2020 for regular follow-up.  Past medical history, Surgical history, Family history not pertinant except as noted below, Social history, Allergies, and medications have been entered into the medical record, reviewed, and corrections made.   Review of Systems: No fevers, chills, night sweats, weight loss, chest pain, or shortness of breath.   Objective:    General: Well Developed, well nourished, and in no acute distress.  Neuro: Alert and oriented x3, extra-ocular muscles intact, sensation grossly intact.  HEENT: Normocephalic, atraumatic, sharp SEM .   Skin: Warm and dry, no rashes. Cardiac: Regular rate and rhythm, no murmurs rubs or gallops, no lower extremity edema.  Respiratory: Clear to auscultation bilaterally. Not using accessory muscles, speaking in full sentences.   Impression and Recommendations:    HTN - Well controlled. Continue current regimen. Follow up in  44months.    DM -controlled.  Hemoglobin A1c of 6.0.  Continue current regimen.  Follow-up in 4 months.  OAB - stable. Refills sent.    CAD- stable. Just saw Dr. Beverlyn Roux in June.

## 2018-09-20 ENCOUNTER — Other Ambulatory Visit: Payer: Self-pay | Admitting: *Deleted

## 2018-09-20 DIAGNOSIS — H5203 Hypermetropia, bilateral: Secondary | ICD-10-CM | POA: Diagnosis not present

## 2018-09-20 DIAGNOSIS — E785 Hyperlipidemia, unspecified: Secondary | ICD-10-CM

## 2018-09-20 MED ORDER — FENOFIBRATE 160 MG PO TABS: 160.0000 mg | ORAL_TABLET | Freq: Every day | ORAL | 3 refills | Status: DC

## 2018-09-20 NOTE — Progress Notes (Deleted)
Subjective:   Todd Parsons is a 73 y.o. male who presents for an Initial Medicare Annual Wellness Visit.  Review of Systems  No ROS.  Medicare Wellness Visit. Additional risk factors are reflected in the social history.     Sleep patterns:    Home Safety/Smoke Alarms: Feels safe in home. Smoke alarms in place.  Living environment; Seat Belt Safety/Bike Helmet: Wears seat belt.  Male:   CCS-     PSA-  Lab Results  Component Value Date   PSA 0.91 01/30/2015       Objective:    There were no vitals filed for this visit. There is no height or weight on file to calculate BMI.  Advanced Directives 04/06/2017 02/18/2017 02/28/2014 01/21/2014  Does Patient Have a Medical Advance Directive? Yes No Patient does not have advance directive;Patient would not like information Patient has advance directive, copy not in chart  Type of Advance Directive [place];Living will - - [place];Living will  Copy of [place] in Chart? No - copy requested - - -  Would patient like information on creating a medical advance directive? - Yes (MAU/Ambulatory/Procedural Areas - Information given) - -    Current Medications (verified) Outpatient Encounter Medications as of 09/25/2018  Medication Sig  . AMBULATORY NON FORMULARY MEDICATION Medication Name: glucometer, meter and strips to test 2 times per week. Dx: Diabetes Type 2.  Marland Kitchen amLODipine (NORVASC) 2.5 MG tablet TAKE 1 TABLET (2.5 MG TOTAL) BY MOUTH DAILY.  Marland Kitchen aspirin EC 81 MG tablet Take 81 mg by mouth daily.  Marland Kitchen atorvastatin (LIPITOR) 40 MG tablet TAKE 1 TABLET (40 MG TOTAL) BY MOUTH AT BEDTIME.  Marland Kitchen Blood Glucose Calibration (ACCU-CHEK AVIVA) SOLN For use of calibration of meter  . fenofibrate 160 MG tablet TAKE 1 TABLET EVERY DAY  . FLUoxetine (PROZAC) 20 MG capsule TAKE 1 CAPSULE EVERY DAY  . fluticasone (FLONASE) 50 MCG/ACT nasal spray USE 2 SPRAYS INTO BOTH NOSTRILS DAILY.  . metFORMIN  (GLUCOPHAGE-XR) 500 MG 24 hr tablet Take 2 tablets (1,000 mg total) by mouth daily with breakfast.  . metoprolol tartrate (LOPRESSOR) 50 MG tablet TAKE 1 TABLET TWICE DAILY  . Multiple Vitamin (MULTIVITAMIN WITH MINERALS) TABS tablet Take 1 tablet by mouth daily.  Marland Kitchen oxybutynin (DITROPAN) 5 MG tablet TAKE 1 TABLET TWICE DAILY (Patient taking differently: Take 5 mg by mouth daily. )  . terazosin (HYTRIN) 2 MG capsule TAKE 1 CAPSULE AT BEDTIME   No facility-administered encounter medications on file as of 09/25/2018.     Allergies (verified) Patient has no known allergies.   History: Past Medical History:  Diagnosis Date  . Arthritis    osteoarthritis  . Bicuspid aortic valve   . Coronary artery disease   . Dysrhythmia    atrial flutter  . Echocardiogram with ECG monitoring 01/12/2010   60-65% EF  . Hyperlipidemia   . Hypertension   . Pre-diabetes   . Sleep apnea    Past Surgical History:  Procedure Laterality Date  . A-FLUTTER ABLATION  2017  . ACROMIO-CLAVICULAR JOINT REPAIR Right 1980s      . apnea device implant     then removed.    Marland Kitchen CARDIAC CATHETERIZATION  07/2015  . CARDIAC VALVE REPLACEMENT  08/05/2015   Aortic valve  . CORONARY ARTERY BYPASS GRAFT  08/05/2015  . left knee arthoscopy    . NOSE SURGERY  late 50's   to fix a broken nose  .  TONSILECTOMY, ADENOIDECTOMY, BILATERAL MYRINGOTOMY AND TUBES    . TOTAL KNEE ARTHROPLASTY Right 02/28/2017   Procedure: RIGHT TOTAL KNEE ARTHROPLASTY;  Surgeon: Dorna Leitz, MD;  Location: [facility];  Service: Orthopedics;  Laterality: Right;   Family History  Problem Relation Age of Onset  . Heart disease Father 15       bypass surgery  . Stroke Mother 97  . Cancer Mother        upper palate   Social History   Socioeconomic History  . Marital status: Married    Spouse name: Not on file  . Number of children: Not on file  . Years of education: Not on file  . Highest education level: Not on file  Occupational History  .  Occupation: [place] [employer]  Social Needs  . Financial resource strain: Not on file  . Food insecurity:    Worry: Not on file    Inability: Not on file  . Transportation needs:    Medical: Not on file    Non-medical: Not on file  Tobacco Use  . Smoking status: Former Smoker    Last attempt to quit: 04/24/1978    Years since quitting: 40.4  . Smokeless tobacco: Never Used  Substance and Sexual Activity  . Alcohol use: Yes    Alcohol/week: 2.0 standard drinks    Types: 2 Standard drinks or equivalent per week    Comment: socially  . Drug use: No  . Sexual activity: Not on file  Lifestyle  . Physical activity:    Days per week: Not on file    Minutes per session: Not on file  . Stress: Not on file  Relationships  . Social connections:    Talks on phone: Not on file    Gets together: Not on file    Attends religious service: Not on file    Active member of club or organization: Not on file    Attends meetings of clubs or organizations: Not on file    Relationship status: Not on file  Other Topics Concern  . Not on file  Social History Narrative   Some regular exercise.    Tobacco Counseling Counseling given: Not Answered   Clinical Intake:                       Activities of Daily Living No flowsheet data found.   Immunizations and Health Maintenance Immunization History  Administered Date(s) Administered  . Influenza Split 05/19/2012  . Influenza Whole 05/05/2011  . Influenza, High Dose Seasonal PF 05/30/2017, 06/13/2018  . Influenza,inj,Quad PF,6+ Mos 07/27/2013, 07/08/2014, 06/02/2016  . Influenza-Unspecified 07/06/2015  . Pneumococcal Conjugate-13 07/24/2014  . Pneumococcal Polysaccharide-23 07/12/2010  . Tdap 08/19/2011  . Zoster 06/11/2008   Health Maintenance Due  Topic Date Due  . OPHTHALMOLOGY EXAM  03/24/1956  . MAMMOGRAM  03/24/1995  . COLONOSCOPY  06/24/2016    Patient Care Team: Hali Marry, MD as PCP - General  (Family Medicine) Ellis Parents, MD as Attending Physician (Cardiology)  Indicate any recent Medical Services you may have received from other than [facility] providers in the past year (date may be approximate).    Assessment:   This is a routine wellness examination for Ty.Physical assessment deferred to PCP.   Hearing/Vision screen No exam data present  Dietary issues and exercise activities discussed:   Diet  Breakfast: Lunch:  Dinner:       Goals   None    Depression Screen  PHQ 2/9 Scores 06/13/2018 09/29/2017 10/26/2016 07/26/2016  PHQ - 2 Score 0 0 0 0  PHQ- 9 Score - 0 1 1    Fall Risk Fall Risk  06/13/2018 09/29/2017 10/26/2016 07/26/2016 06/05/2015  Falls in the past year? No No No No No    Is the patient's home free of loose throw rugs in walkways, pet beds, electrical cords, etc?   {Blank single:19197::"yes","no"}      Grab bars in the bathroom? {Blank single:19197::"yes","no"}      Handrails on the stairs?   {Blank single:19197::"yes","no"}      Adequate lighting?   {Blank single:19197::"yes","no"}  Cognitive Function:        Screening Tests Health Maintenance  Topic Date Due  . OPHTHALMOLOGY EXAM  03/24/1956  . MAMMOGRAM  03/24/1995  . COLONOSCOPY  06/24/2016  . HEMOGLOBIN A1C  12/13/2018  . FOOT EXAM  02/04/2019  . TETANUS/TDAP  08/18/2021  . INFLUENZA VACCINE  Completed  . DEXA SCAN  Completed  . Hepatitis C Screening  Completed  . PNA vac Low Risk Adult  Completed      Plan:   ***  I have personally reviewed and noted the following in the patient's chart:   . Medical and social history . Use of alcohol, tobacco or illicit drugs  . Current medications and supplements . Functional ability and status . Nutritional status . Physical activity . Advanced directives . List of other physicians . Hospitalizations, surgeries, and ER visits in previous 12 months . Vitals . Screenings to include cognitive, depression, and falls . Referrals and  appointments  In addition, I have reviewed and discussed with patient certain preventive protocols, quality metrics, and best practice recommendations. A written personalized care plan for preventive services as well as general preventive health recommendations were provided to patient.     Joanne Chars, LPN   [id number]/84/[id number]

## 2018-09-25 ENCOUNTER — Ambulatory Visit: Payer: Medicare HMO

## 2018-09-27 ENCOUNTER — Ambulatory Visit: Payer: Medicare Other

## 2018-10-02 ENCOUNTER — Ambulatory Visit (INDEPENDENT_AMBULATORY_CARE_PROVIDER_SITE_OTHER): Payer: Medicare Other | Admitting: *Deleted

## 2018-10-02 VITALS — BP 124/79 | Ht 67.0 in | Wt 191.0 lb

## 2018-10-02 DIAGNOSIS — Z0000 Encounter for general adult medical examination without abnormal findings: Secondary | ICD-10-CM | POA: Diagnosis not present

## 2018-10-02 DIAGNOSIS — R351 Nocturia: Secondary | ICD-10-CM | POA: Diagnosis not present

## 2018-10-02 NOTE — Progress Notes (Signed)
Subjective:   Todd Parsons is a 73 y.o. male who presents for Medicare Annual/Subsequent preventive examination.  Review of Systems:  No ROS.  Medicare Wellness Visit. Additional risk factors are reflected in the social history.  Cardiac Risk Factors include: advanced age (>74men, >56 women);diabetes mellitus;dyslipidemia;hypertension;male gender;smoking/ tobacco exposure  Sleep patterns: Getting 8 hours of sleep a night. Wakes up 2-3 times a night to use the bathroom. Feels rested when wakes up. Home Safety/Smoke Alarms: Feels safe in home. Smoke alarms in place.  Living environment;Lives with wife in 2 story home handrails are in place on the stairs. Seat Belt Safety/Bike Helmet: Wears seat belt.    Male:   CCS- utd    PSA- ordered Eye Exam- done in Jan. At [place] pt report normal and no retinopathy Lab Results  Component Value Date   PSA 0.91 01/30/2015        Objective:    Vitals: BP 124/79 (BP Location: Left Arm, Patient Position: Sitting, Cuff Size: Normal)   Ht 5\' 7"  (1.702 m)   Wt 191 lb (86.6 kg)   SpO2 96%   BMI 29.91 kg/m   Body mass index is 29.91 kg/m.  Advanced Directives 10/02/2018 04/06/2017 02/18/2017 02/28/2014 01/21/2014  Does Patient Have a Medical Advance Directive? No Yes No Patient does not have advance directive;Patient would not like information Patient has advance directive, copy not in chart  Type of Advance Directive - [place];Living will - - [place];Living will  Copy of [place] in Chart? - No - copy requested - - -  Would patient like information on creating a medical advance directive? Yes (MAU/Ambulatory/Procedural Areas - Information given) - Yes (MAU/Ambulatory/Procedural Areas - Information given) - -    Tobacco Social History   Tobacco Use  Smoking Status Former Smoker  . Last attempt to quit: 04/24/1978  . Years since quitting: 40.4  Smokeless Tobacco Never Used      Counseling given: No   Clinical Intake:  Pre-visit preparation completed: No  Pain : No/denies pain     Nutritional Risks: None Diabetes: Yes CBG done?: No Did pt. bring in CBG monitor from home?: No  How often do you need to have someone help you when you read instructions, pamphlets, or other written materials from your doctor or pharmacy?: 1 - Never What is the last grade level you completed in school?: 18  Interpreter Needed?: No  Information entered by :: Orlie Dakin, LPN  Past Medical History:  Diagnosis Date  . Arthritis    osteoarthritis  . Bicuspid aortic valve   . Coronary artery disease   . Dysrhythmia    atrial flutter  . Echocardiogram with ECG monitoring 01/12/2010   60-65% EF  . Hyperlipidemia   . Hypertension   . Pre-diabetes   . Sleep apnea    Past Surgical History:  Procedure Laterality Date  . A-FLUTTER ABLATION  2017  . ACROMIO-CLAVICULAR JOINT REPAIR Right 1980s      . apnea device implant     then removed.    Marland Kitchen CARDIAC CATHETERIZATION  07/2015  . CARDIAC VALVE REPLACEMENT  08/05/2015   Aortic valve  . CORONARY ARTERY BYPASS GRAFT  08/05/2015  . left knee arthoscopy    . NOSE SURGERY  late 50's   to fix a broken nose  . TONSILECTOMY, ADENOIDECTOMY, BILATERAL MYRINGOTOMY AND TUBES    . TOTAL KNEE ARTHROPLASTY Right 02/28/2017   Procedure: RIGHT TOTAL KNEE ARTHROPLASTY;  Surgeon: Dorna Leitz, MD;  Location: [facility];  Service: Orthopedics;  Laterality: Right;   Family History  Problem Relation Age of Onset  . Heart disease Father 81       bypass surgery  . Stroke Mother 23  . Cancer Mother        upper palate   Social History   Socioeconomic History  . Marital status: Married    Spouse name:  [name]  . Number of children: 3  . Years of education: 72  . Highest education level: Master's degree (e.g., MA, MS, MEng, MEd, MSW, MBA)  Occupational History  . 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  Financial resource strain: Not hard at all  . Food insecurity:    Worry: Never true    Inability: Never true  . Transportation needs:    Medical: No    Non-medical: No  Tobacco Use  . Smoking status: Former Smoker    Last attempt to quit: 04/24/1978    Years since quitting: 40.4  . Smokeless tobacco: Never Used  Substance and Sexual Activity  . Alcohol use: Yes    Alcohol/week: 2.0 standard drinks    Types: 2 Standard drinks or equivalent per week    Comment: socially  . Drug use: No  . Sexual activity: Yes  Lifestyle  . Physical activity:    Days per week: 0 days    Minutes per session: 0 min  . Stress: Not at all  Relationships  . Social connections:    Talks on phone: Twice a week    Gets together: Once a week    Attends religious service: Never    Active member of club or organization: No    Attends meetings of clubs or organizations: Never    Relationship status: Married  Other Topics Concern  . Not on file  Social History Narrative   Had 2 surgeries in the past 2 years ago and has not got back into exercising. Will start back with doing this. Wife retiring in April.    Outpatient Encounter Medications as of 10/02/2018  Medication Sig  . AMBULATORY NON FORMULARY MEDICATION Medication Name: glucometer, meter and strips to test 2 times per week. Dx: Diabetes Type 2.  Marland Kitchen amLODipine (NORVASC) 2.5 MG tablet TAKE 1 TABLET (2.5 MG TOTAL) BY MOUTH DAILY.  Marland Kitchen aspirin EC 81 MG tablet Take 81 mg by mouth daily.  Marland Kitchen atorvastatin (LIPITOR) 40 MG tablet TAKE 1 TABLET (40 MG TOTAL) BY MOUTH AT BEDTIME.  Marland Kitchen Blood Glucose Calibration (ACCU-CHEK AVIVA) SOLN For use of calibration of meter  . fenofibrate 160 MG tablet Take 1 tablet (160 mg total) by mouth daily.  Marland Kitchen FLUoxetine (PROZAC) 20 MG capsule TAKE 1 CAPSULE EVERY DAY  . fluticasone (FLONASE) 50 MCG/ACT nasal spray USE 2 SPRAYS INTO BOTH NOSTRILS DAILY.  . metFORMIN (GLUCOPHAGE-XR) 500 MG 24 hr tablet Take 2 tablets (1,000 mg total)  by mouth daily with breakfast.  . metoprolol tartrate (LOPRESSOR) 50 MG tablet TAKE 1 TABLET TWICE DAILY (Patient taking differently: Take 50 mg by mouth daily. )  . Multiple Vitamin (MULTIVITAMIN WITH MINERALS) TABS tablet Take 1 tablet by mouth daily.  Marland Kitchen oxybutynin (DITROPAN) 5 MG tablet TAKE 1 TABLET TWICE DAILY (Patient taking differently: Take 5 mg by mouth daily. )  . terazosin (HYTRIN) 2 MG capsule TAKE 1 CAPSULE AT BEDTIME   No facility-administered encounter medications on file as of 10/02/2018.  Activities of Daily Living In your present state of health, do you have any difficulty performing the following activities: 10/02/2018  Hearing? Y  Comment has hearing aids provided by the [facility]? N  Difficulty concentrating or making decisions? N  Walking or climbing stairs? N  Dressing or bathing? N  Doing errands, shopping? N  Preparing Food and eating ? N  Using the Toilet? N  In the past six months, have you accidently leaked urine? Y  Comment when has to go its urgent and thats when it happens  Do you have problems with loss of bowel control? N  Managing your Medications? N  Managing your Finances? N  Housekeeping or managing your Housekeeping? N  Some recent data might be hidden    Patient Care Team: Hali Marry, MD as PCP - General (Family Medicine) Ellis Parents, MD as Attending Physician (Cardiology)   Assessment:   This is a routine wellness examination for [place].Physical assessment deferred to PCP.Had annual visit with [facility] in January. Eye Exam done Breakfast: eggs, sausage toast. Drinks a lot of milk Lunch: just snacks at [employer]. Pimento cheese sandwich Dinner:  Meat and vegetables. Does not eat fast food a lot. Does eat a lot of [place]     Goals    . Exercise 3x per week (30 min per time)     Start walking at least 3 times a week for at least 30 minutes a day to start.       Fall Risk Fall Risk  10/02/2018 06/13/2018 09/29/2017 10/26/2016  07/26/2016  Falls in the past year? 1 No No No No  Number falls in past yr: 0 - - - -  Injury with Fall? 0 - - - -  Follow up Falls prevention discussed - - - -   Is the patient's home free of loose throw rugs in walkways, pet beds, electrical cords, etc?   yes      Grab bars in the bathroom? yes      Handrails on the stairs?   yes      Adequate lighting?   yes   Depression Screen PHQ 2/9 Scores 10/02/2018 06/13/2018 09/29/2017 10/26/2016  PHQ - 2 Score 0 0 0 0  PHQ- 9 Score - - 0 1    Cognitive Function     6CIT Screen 10/02/2018  What Year? 0 points  What month? 0 points  What time? 0 points  Count back from 20 0 points  Months in reverse 0 points  Repeat phrase 0 points  Total Score 0    Immunization History  Administered Date(s) Administered  . Influenza Split 05/19/2012  . Influenza Whole 05/05/2011  . Influenza, High Dose Seasonal PF 05/30/2017, 06/13/2018  . Influenza,inj,Quad PF,6+ Mos 07/27/2013, 07/08/2014, 06/02/2016  . Influenza-Unspecified 07/06/2015  . Pneumococcal Conjugate-13 07/24/2014  . Pneumococcal Polysaccharide-23 07/12/2010  . Tdap 08/19/2011  . Zoster 06/11/2008    Screening Tests Health Maintenance  Topic Date Due  . OPHTHALMOLOGY EXAM  03/24/1956  . MAMMOGRAM  03/24/1995  . COLONOSCOPY  06/24/2016  . HEMOGLOBIN A1C  12/13/2018  . FOOT EXAM  02/04/2019  . TETANUS/TDAP  08/18/2021  . INFLUENZA VACCINE  Completed  . DEXA SCAN  Completed  . Hepatitis C Screening  Completed  . PNA vac Low Risk Adult  Completed        Plan:      Ms. Roedl , Thank you for taking time to come for your Medicare Wellness Visit.  I appreciate your ongoing commitment to your health goals. Please review the following plan we discussed and let me know if I can assist you in the future.  Please schedule your next medicare wellness visit with me in 1 yr. Bring a copy of your living will and/or healthcare power of attorney to your next office visit. Continue  doing brain stimulating activities (puzzles, reading, adult coloring books, staying active) to keep memory sharp.   Please go down and have your PSA drawn today at the lab and we will call with results.  These are the goals we discussed: Goals    . Exercise 3x per week (30 min per time)     Start walking at least 3 times a week for at least 30 minutes a day to start.       This is a list of the screening recommended for you and due dates:  Health Maintenance  Topic Date Due  . Eye exam for diabetics  03/24/1956  . Mammogram  03/24/1995  . Colon Cancer Screening  06/24/2016  . Hemoglobin A1C  12/13/2018  . Complete foot exam   02/04/2019  . Tetanus Vaccine  08/18/2021  . Flu Shot  Completed  . DEXA scan (bone density measurement)  Completed  .  Hepatitis C: One time screening is recommended by Center for Disease Control  (CDC) for  adults born from [birth year] through 1965.   Completed  . Pneumonia vaccines  Completed      I have personally reviewed and noted the following in the patient's chart:   . Medical and social history . Use of alcohol, tobacco or illicit drugs  . Current medications and supplements . Functional ability and status . Nutritional status . Physical activity . Advanced directives . List of other physicians . Hospitalizations, surgeries, and ER visits in previous 12 months . Vitals . Screenings to include cognitive, depression, and falls . Referrals and appointments  In addition, I have reviewed and discussed with patient certain preventive protocols, quality metrics, and best practice recommendations. A written personalized care plan for preventive services as well as general preventive health recommendations were provided to patient.     Joanne Chars, LPN  [id number]

## 2018-10-02 NOTE — Patient Instructions (Signed)
Todd Parsons , Thank you for taking time to come for your Medicare Wellness Visit. I appreciate your ongoing commitment to your health goals. Please review the following plan we discussed and let me know if I can assist you in the future.  Please schedule your next medicare wellness visit with me in 1 yr. Bring a copy of your living will and/or healthcare power of attorney to your next office visit. Continue doing brain stimulating activities (puzzles, reading,coloring books, staying active) to keep memory sharp.   Please go down and have your PSA drawn today at the lab and we will call with results.   These are the goals we discussed: Goals    . Exercise 3x per week (30 min per time)     Start walking at least 3 times a week for at least 30 minutes a day to start.

## 2018-10-03 LAB — PSA: PSA: 0.8 ng/mL (ref <=4.0)

## 2018-10-03 NOTE — Progress Notes (Signed)
All labs are normal. 

## 2018-10-16 ENCOUNTER — Encounter: Payer: Self-pay | Admitting: Family Medicine

## 2018-10-16 ENCOUNTER — Ambulatory Visit (INDEPENDENT_AMBULATORY_CARE_PROVIDER_SITE_OTHER): Payer: Medicare Other | Admitting: Family Medicine

## 2018-10-16 VITALS — BP 89/60 | HR 73 | Ht 67.0 in | Wt 190.0 lb

## 2018-10-16 DIAGNOSIS — R300 Dysuria: Secondary | ICD-10-CM

## 2018-10-16 DIAGNOSIS — E785 Hyperlipidemia, unspecified: Secondary | ICD-10-CM | POA: Diagnosis not present

## 2018-10-16 DIAGNOSIS — E1159 Type 2 diabetes mellitus with other circulatory complications: Secondary | ICD-10-CM | POA: Diagnosis not present

## 2018-10-16 DIAGNOSIS — I10 Essential (primary) hypertension: Secondary | ICD-10-CM | POA: Diagnosis not present

## 2018-10-16 LAB — POCT GLYCOSYLATED HEMOGLOBIN (HGB A1C): HEMOGLOBIN A1C: 6.1 % — AB (ref 4.0–5.6)

## 2018-10-16 NOTE — Progress Notes (Signed)
Subjective:    CC:   HPI:  Hypertension- Pt denies chest pain, SOB, dizziness, or heart palpitations.  Taking meds as directed w/o problems.  Denies medication side effects.  He is completely stopped his amlodipine.  Diabetes - no hypoglycemic events. No wounds or sores that are not healing well. No increased thirst or urination. Checking glucose at home. Taking medications as prescribed without any side effects.  Had some burning with urination for a couple of days.  No blood or fevers or chills or back pain.  Complains of chronic drainage and postnasal drip in his throat that causes him to cough or clear his throat regularly.  He says sometimes it is better than others and Sudafed does seem to help but he does not take it frequently.  Past medical history, Surgical history, Family history not pertinant except as noted below, Social history, Allergies, and medications have been entered into the medical record, reviewed, and corrections made.   Review of Systems: No fevers, chills, night sweats, weight loss, chest pain, or shortness of breath.   Objective:    General: Well Developed, well nourished, and in no acute distress.  Neuro: Alert and oriented x3, extra-ocular muscles intact, sensation grossly intact.  HEENT: Normocephalic, atraumatic  Skin: Warm and dry, no rashes. Cardiac: Regular rate and rhythm, no murmurs rubs or gallops, no lower extremity edema.  Respiratory: Clear to auscultation bilaterally. Not using accessory muscles, speaking in full sentences.   Impression and Recommendations:    HTN -pressure is a little bit low today he was just here couple weeks ago and it looks fantastic.  He has not been drinking much fluid over the last couple days or just encouraged him to really push his fluids and check his pressure at home over the next few days if he still having low blood pressure then please let us know. Removed amlodipine from med list.   DM -stable.  Hemoglobin A1c  of 6.1.  Continue work on [employer] and regular exercise.  Due for CMP and lipid panel.  dysruria - he declined to do a urinalysis today but said he would call me back in a couple days if he was still having symptoms.  Postnasal drip causing coughing and throat clearing-recommend a trial of an antihistamine versus his Flonase.  Can also add in nasal saline irrigation.  Colonoscopy was in 2012.  They did not do any biopsies at the time but did recommend that he repeat his scope in 5 years.  We will try to track that down and see if he really is due.

## 2018-10-17 LAB — COMPLETE METABOLIC PANEL WITH GFR
AG Ratio: 1.4 (calc) (ref 1.0–2.5)
ALT: 31 U/L (ref 9–46)
AST: 32 U/L (ref 10–35)
Albumin: 3.9 g/dL (ref 3.6–5.1)
Alkaline phosphatase (APISO): 60 U/L (ref 35–144)
BUN: 21 mg/dL (ref 7–25)
CALCIUM: 10.2 mg/dL (ref 8.6–10.3)
CO2: 29 mmol/L (ref 20–32)
Chloride: 103 mmol/L (ref 98–110)
Creat: 1.11 mg/dL (ref 0.70–1.18)
GFR, EST NON AFRICAN AMERICAN: 66 mL/min/{1.73_m2} (ref >=60)
GFR, Est African American: 76 mL/min/{1.73_m2} (ref >=60)
Globulin: 2.8 g/dL (calc) (ref 1.9–3.7)
Glucose, Bld: 95 mg/dL (ref 65–99)
Potassium: 4.8 mmol/L (ref 3.5–5.3)
Sodium: 139 mmol/L (ref 135–146)
Total Bilirubin: 0.4 mg/dL (ref 0.2–1.2)
Total Protein: 6.7 g/dL (ref 6.1–8.1)

## 2018-10-17 LAB — LIPID PANEL
Cholesterol: 114 mg/dL (ref <200)
HDL: 21 mg/dL — ABNORMAL LOW (ref >=40)
LDL Cholesterol (Calc): 65 mg/dL (calc)
NON-HDL CHOLESTEROL (CALC): 93 mg/dL (ref <130)
Total CHOL/HDL Ratio: 5.4 (calc) — ABNORMAL HIGH (ref <5.0)
Triglycerides: 216 mg/dL — ABNORMAL HIGH (ref <150)

## 2018-10-26 ENCOUNTER — Encounter: Payer: Self-pay | Admitting: Family Medicine

## 2018-11-06 ENCOUNTER — Encounter: Payer: Self-pay | Admitting: Sports Medicine

## 2018-11-06 ENCOUNTER — Ambulatory Visit (INDEPENDENT_AMBULATORY_CARE_PROVIDER_SITE_OTHER): Payer: Medicare Other

## 2018-11-06 ENCOUNTER — Ambulatory Visit (INDEPENDENT_AMBULATORY_CARE_PROVIDER_SITE_OTHER): Payer: Medicare Other | Admitting: Sports Medicine

## 2018-11-06 DIAGNOSIS — R05 Cough: Secondary | ICD-10-CM

## 2018-11-06 DIAGNOSIS — R059 Cough, unspecified: Secondary | ICD-10-CM

## 2018-11-06 DIAGNOSIS — H6123 Impacted cerumen, bilateral: Secondary | ICD-10-CM

## 2018-11-06 DIAGNOSIS — R053 Chronic cough: Secondary | ICD-10-CM | POA: Insufficient documentation

## 2018-11-06 MED ORDER — PREDNISONE 50 MG PO TABS: ORAL_TABLET | ORAL | 0 refills | Status: DC

## 2018-11-06 MED ORDER — AZITHROMYCIN 250 MG PO TABS: ORAL_TABLET | ORAL | 0 refills | Status: DC

## 2018-11-06 NOTE — Assessment & Plan Note (Signed)
Irrigation as above. We discussed that a cerumen impaction can cause recurrent cough secondary to [facility] reflex.

## 2018-11-06 NOTE — Assessment & Plan Note (Signed)
Coughing, laryngitis symptoms, he also has some coarse breath sounds, crackles and wheezes in the right middle lobe. I am concerned for pneumonia, and azithromycin, prednisone, chest x-ray.

## 2018-11-06 NOTE — Progress Notes (Signed)
Subjective:    CC: Coughing  HPI: For the past week this pleasant 73 year old male has had increasing coughing, wheezing, mild shortness of breath.  Mild fever, no chills, muscle aches, body aches.  No GI symptoms, no skin rash.  No orthopnea, no PND.  Symptoms are moderate, persistent.  I reviewed the past medical history, family history, social history, surgical history, and allergies today and no changes were needed.  Please see the problem list section below in epic for further details.  Past Medical History: Past Medical History:  Diagnosis Date  . Arthritis    osteoarthritis  . Bicuspid aortic valve   . Coronary artery disease   . Dysrhythmia    atrial flutter  . Echocardiogram with ECG monitoring 01/12/2010   60-65% EF  . Hyperlipidemia   . Hypertension   . Pre-diabetes   . Sleep apnea    Past Surgical History: Past Surgical History:  Procedure Laterality Date  . A-FLUTTER ABLATION  2017  . ACROMIO-CLAVICULAR JOINT REPAIR Right 1980s      . apnea device implant     then removed.    Marland Kitchen CARDIAC CATHETERIZATION  07/2015  . CARDIAC VALVE REPLACEMENT  08/05/2015   Aortic valve  . CORONARY ARTERY BYPASS GRAFT  08/05/2015  . left knee arthoscopy    . NOSE SURGERY  late 50's   to fix a broken nose  . TONSILECTOMY, ADENOIDECTOMY, BILATERAL MYRINGOTOMY AND TUBES    . TOTAL KNEE ARTHROPLASTY Right 02/28/2017   Procedure: RIGHT TOTAL KNEE ARTHROPLASTY;  Surgeon: Dorna Leitz, MD;  Location: [facility];  Service: Orthopedics;  Laterality: Right;   Social History: Social History   Socioeconomic History  . Marital status: Married    Spouse name:  [name]  . Number of children: 3  . Years of education: 73  . Highest education level: Master's degree (e.g., MA, MS, MEng, MEd, MSW, MBA)  Occupational History  . 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 Financial resource strain: Not hard at all  . Food insecurity:    Worry: Never true   Inability: Never true  . Transportation needs:    Medical: No    Non-medical: No  Tobacco Use  . Smoking status: Former Smoker    Last attempt to quit: 04/24/1978    Years since quitting: 40.5  . Smokeless tobacco: Never Used  Substance and Sexual Activity  . Alcohol use: Yes    Alcohol/week: 2.0 standard drinks    Types: 2 Standard drinks or equivalent per week    Comment: socially  . Drug use: No  . Sexual activity: Yes  Lifestyle  . Physical activity:    Days per week: 0 days    Minutes per session: 0 min  . Stress: Not at all  Relationships  . Social connections:    Talks on phone: Twice a week    Gets together: Once a week    Attends religious service: Never    Active member of club or organization: No    Attends meetings of clubs or organizations: Never    Relationship status: Married  Other Topics Concern  . Not on file  Social History Narrative   Had 2 surgeries in the past 2 years ago and has not got back into exercising. Will start back with doing this. Wife retiring in April.   Family History: Family History  Problem Relation Age of Onset  . Heart disease Father 55  bypass surgery  . Stroke Mother 4  . Cancer Mother        upper palate   Allergies: No Known Allergies Medications: See med rec.  Review of Systems: No fevers, chills, night sweats, weight loss, chest pain, or shortness of breath.   Objective:    General: Well Developed, well nourished, and in no acute distress.  Neuro: Alert and oriented x3, extra-ocular muscles intact, sensation grossly intact.  HEENT: Normocephalic, atraumatic, pupils equal round reactive to light, neck supple, no masses, no lymphadenopathy, thyroid nonpalpable.  Oropharynx, nasopharynx unremarkable, bilateral cerumen impactions. Skin: Warm and dry, no rashes. Cardiac: Regular rate and rhythm, no murmurs rubs or gallops, no lower extremity edema.  Respiratory: Coarse sounds with crackles in the right middle lobe.  Not using accessory muscles, speaking in full sentences.  Indication: Cerumen impaction of the left and right ear(s) Medical necessity statement: On physical examination, cerumen impairs clinically significant portions of the external auditory canal, and tympanic membrane. Noted obstructive, copious cerumen that cannot be removed without magnification and instrumentations requiring physician skills Consent: Discussed benefits and risks of procedure and verbal consent obtained Procedure: Patient was prepped for the procedure. Utilized an otoscope to assess and take note of the ear canal, the tympanic membrane, and the presence, amount, and placement of the cerumen. Gentle water irrigation was utilized to remove cerumen.  Post procedure examination: shows cerumen was completely removed. Patient tolerated procedure well. The patient is made aware that they may experience temporary vertigo, temporary hearing loss, and temporary discomfort. If these symptom last for more than 24 hours to call the clinic or proceed to the ED.  Impression and Recommendations:    Coughing Coughing, laryngitis symptoms, he also has some coarse breath sounds, crackles and wheezes in the right middle lobe. I am concerned for pneumonia, and azithromycin, prednisone, chest x-ray.   Bilateral hearing loss due to cerumen impaction Irrigation as above. We discussed that a cerumen impaction can cause recurrent cough secondary to [facility] reflex. ___________________________________________ Gwen Her. Dianah Field, M.D., ABFM., CAQSM. Primary Care and Sports Medicine [facility]  Adjunct Professor of [employer]

## 2018-11-29 ENCOUNTER — Other Ambulatory Visit: Payer: Self-pay | Admitting: *Deleted

## 2018-11-29 DIAGNOSIS — E119 Type 2 diabetes mellitus without complications: Secondary | ICD-10-CM

## 2018-11-29 MED ORDER — METFORMIN HCL ER 500 MG PO TB24: 1000.0000 mg | ORAL_TABLET | Freq: Every day | ORAL | 3 refills | Status: DC

## 2019-02-05 ENCOUNTER — Other Ambulatory Visit: Payer: Self-pay | Admitting: *Deleted

## 2019-02-05 DIAGNOSIS — E119 Type 2 diabetes mellitus without complications: Secondary | ICD-10-CM

## 2019-02-08 MED ORDER — TERAZOSIN HCL 2 MG PO CAPS: 2.0000 mg | ORAL_CAPSULE | Freq: Every day | ORAL | 2 refills | Status: DC

## 2019-02-08 MED ORDER — ATORVASTATIN CALCIUM 40 MG PO TABS: 40.0000 mg | ORAL_TABLET | Freq: Every day | ORAL | 3 refills | Status: DC

## 2019-02-08 MED ORDER — FLUOXETINE HCL 20 MG PO CAPS: 20.0000 mg | ORAL_CAPSULE | Freq: Every day | ORAL | 1 refills | Status: DC

## 2019-02-14 ENCOUNTER — Telehealth (INDEPENDENT_AMBULATORY_CARE_PROVIDER_SITE_OTHER): Payer: Medicare Other | Admitting: Family Medicine

## 2019-02-14 ENCOUNTER — Encounter: Payer: Self-pay | Admitting: Family Medicine

## 2019-02-14 VITALS — BP 124/78 | HR 58 | Temp 98.4°F | Ht 67.0 in | Wt 189.0 lb

## 2019-02-14 DIAGNOSIS — R413 Other amnesia: Secondary | ICD-10-CM | POA: Diagnosis not present

## 2019-02-14 DIAGNOSIS — S0990XA Unspecified injury of head, initial encounter: Secondary | ICD-10-CM

## 2019-02-14 DIAGNOSIS — E119 Type 2 diabetes mellitus without complications: Secondary | ICD-10-CM

## 2019-02-14 MED ORDER — OXYBUTYNIN CHLORIDE 5 MG PO TABS: 5.0000 mg | ORAL_TABLET | Freq: Every day | ORAL | 3 refills | Status: DC

## 2019-02-14 NOTE — Progress Notes (Signed)
Virtual Visit via Video Note  I connected with Todd Parsons on 02/14/19 at 11:10 AM EDT by a video enabled telemedicine application and verified that I am speaking with the correct person using two identifiers.   I discussed the limitations of evaluation and management by telemedicine and the availability of in person appointments. The patient expressed understanding and agreed to proceed.  Pt was at home and I was in my office for the virtual visit.     Subjective:    CC:   HPI: Pt reports that he has been experiencing some trouble "finding words" and being easily distracted. Over the past 6 mos.  It is not common, Couple of times a week it happens. He feels like he sleeps well. Usually takes a nap once a day.   Diabetes - no hypoglycemic events. No wounds or sores that are not healing well. No increased thirst or urination. Checking glucose at home. Taking medications as prescribed without any side effects.   Hypertension- Pt denies chest pain, SOB, dizziness, or heart palpitations.  Taking meds as directed w/o problems.  Denies medication side effects.    [place] off grandson's scooter about a week ago.  He decided to get on it and go down his driveway on it which is actually quite steep.  It went a lot faster than he thought and so he put his foot down to slow it down and it actually just toppled forward.  Hit his head on the concrete. Didn't lose consciousness.  No concussion symptoms.  He denies any persisting headaches dizziness lightheadedness nausea or vomiting afterwards.  He said EMS actually came out and checked him out.  He did not seek any additional medical care.   Past medical history, Surgical history, Family history not pertinant except as noted below, Social history, Allergies, and medications have been entered into the medical record, reviewed, and corrections made.   Review of Systems: No fevers, chills, night sweats, weight loss, chest pain, or shortness of breath.    Objective:    General: Speaking clearly in complete sentences without any shortness of breath.  Alert and oriented x3.  Normal judgment. No apparent acute distress. WEll groomed. Wife in present for the visit.     Impression and Recommendations:    HTN - Well controlled. Continue current regimen. Follow up in 3 to 4 months if doing well.  DM - due for A1C. Has been eating a lot so work on cutting back and go to the lab in late July or early August.  Otherwise I will see him back in about 4 months.  Memory impairment - will perform the MMSE, MOCHA when next here.  This should at least give [place] a baseline and it may can follow from there.  We can also do some additional labs if needed.  Right now he is not very worried about it but just wanted to make me aware.  We will be aggressive in the work-up at this point.  Head injury without loss of consciousness-it sounds like he did not have a significant injury as there were no concussion type symptoms or any lingering neurologic symptoms afterwards.  Which is very reassuring.   I discussed the assessment and treatment plan with the patient. The patient was provided an opportunity to ask questions and all were answered. The patient agreed with the plan and demonstrated an understanding of the instructions.   The patient was advised to call back or seek an in-person evaluation if  the symptoms worsen or if the condition fails to improve as anticipated.   Beatrice Lecher, MD

## 2019-02-14 NOTE — Progress Notes (Signed)
Pt reports that he has been experiencing some trouble "finding words" and being easily distracted. over the past 6 mos. He wanted to speak w/Dr. Madilyn Fireman about this.Todd Parsons, [place]

## 2019-04-26 ENCOUNTER — Ambulatory Visit (INDEPENDENT_AMBULATORY_CARE_PROVIDER_SITE_OTHER): Payer: Medicare Other | Admitting: Family Medicine

## 2019-04-26 ENCOUNTER — Encounter: Payer: Self-pay | Admitting: Family Medicine

## 2019-04-26 VITALS — Temp 98.2°F | Ht 67.0 in | Wt 189.0 lb

## 2019-04-26 DIAGNOSIS — R351 Nocturia: Secondary | ICD-10-CM

## 2019-04-26 DIAGNOSIS — N401 Enlarged prostate with lower urinary tract symptoms: Secondary | ICD-10-CM | POA: Diagnosis not present

## 2019-04-26 DIAGNOSIS — E119 Type 2 diabetes mellitus without complications: Secondary | ICD-10-CM | POA: Diagnosis not present

## 2019-04-26 MED ORDER — FINASTERIDE 5 MG PO TABS: 5.0000 mg | ORAL_TABLET | Freq: Every day | ORAL | 0 refills | Status: DC

## 2019-04-26 NOTE — Progress Notes (Signed)
Pt reports that he has been going to the bathroom 4-5 x a night and he feels that he is not emptying his bladder completely.  He does not have a Urologist..Lanore Renderos, [place], CMA

## 2019-04-26 NOTE — Progress Notes (Signed)
Virtual Visit via Video Note  I connected with Todd Parsons on 04/26/19 at  8:10 AM EDT by a video enabled telemedicine application and verified that I am speaking with the correct person using two identifiers.   I discussed the limitations of evaluation and management by telemedicine and the availability of in person appointments. The patient expressed understanding and agreed to proceed.    Established Patient Office Visit  Subjective:  Patient ID: Todd Parsons, male    DOB: 1945-12-26  Age: 73 y.o. MRN: [id number]  CC:  Chief Complaint  Patient presents with  . Follow-up    HPI Todd Parsons with history of OAB, presents for "Prostate issue".  He is noticed that he is been going to the bathroom 4-5 times a night and feels that he is not emptying his bladder completely.  He says it got worse about a month ago.  He has never seen a urologist for this.  He is already on oxybutynin for his bladder and is already on [place] for his prostate.  He says is been really disruptive to sleep to the point that he feels tired during the day.  No blood in the urine.  No burning with urination.  No fevers chills or sweats.  We did order some lab work in June.  He has not had a chance to go for that yet.  Past Medical History:  Diagnosis Date  . Arthritis    osteoarthritis  . Bicuspid aortic valve   . Coronary artery disease   . Dysrhythmia    atrial flutter  . Echocardiogram with ECG monitoring 01/12/2010   60-65% EF  . Hyperlipidemia   . Hypertension   . Pre-diabetes   . Sleep apnea     Past Surgical History:  Procedure Laterality Date  . A-FLUTTER ABLATION  2017  . ACROMIO-CLAVICULAR JOINT REPAIR Right 1980s      . apnea device implant     then removed.    Marland Kitchen CARDIAC CATHETERIZATION  07/2015  . CARDIAC VALVE REPLACEMENT  08/05/2015   Aortic valve  . CORONARY ARTERY BYPASS GRAFT  08/05/2015  . left knee arthoscopy    . NOSE SURGERY  late 50's   to fix a broken nose  .  TONSILECTOMY, ADENOIDECTOMY, BILATERAL MYRINGOTOMY AND TUBES    . TOTAL KNEE ARTHROPLASTY Right 02/28/2017   Procedure: RIGHT TOTAL KNEE ARTHROPLASTY;  Surgeon: Dorna Leitz, MD;  Location: [facility];  Service: Orthopedics;  Laterality: Right;    Family History  Problem Relation Age of Onset  . Heart disease Father 21       bypass surgery  . Stroke Mother 40  . Cancer Mother        upper palate    Social History   Socioeconomic History  . Marital status: Married    Spouse name:  [name]  . Number of children: 3  . Years of education: 55  . Highest education level: Master's degree (e.g., MA, MS, MEng, MEd, MSW, MBA)  Occupational History  . 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 Financial resource strain: Not hard at all  . Food insecurity    Worry: Never true    Inability: Never true  . Transportation needs    Medical: No    Non-medical: No  Tobacco Use  . Smoking status: Former Smoker    Quit date: 04/24/1978    Years since quitting: 41.0  . Smokeless tobacco: Never  Used  Substance and Sexual Activity  . Alcohol use: Yes    Alcohol/week: 2.0 standard drinks    Types: 2 Standard drinks or equivalent per week    Comment: socially  . Drug use: No  . Sexual activity: Yes  Lifestyle  . Physical activity    Days per week: 0 days    Minutes per session: 0 min  . Stress: Not at all  Relationships  . Social Herbalist on phone: Twice a week    Gets together: Once a week    Attends religious service: Never    Active member of club or organization: No    Attends meetings of clubs or organizations: Never    Relationship status: Married  . Intimate partner violence    Fear of current or ex partner: No    Emotionally abused: No    Physically abused: No    Forced sexual activity: No  Other Topics Concern  . Not on file  Social History Narrative   Had 2 surgeries in the past 2 years ago and has not got back into exercising. Will  start back with doing this. Wife retiring in April.    Outpatient Medications Prior to Visit  Medication Sig Dispense Refill  . AMBULATORY NON FORMULARY MEDICATION Medication Name: glucometer, meter and strips to test 2 times per week. Dx: Diabetes Type 2. 1 vial 0  . aspirin EC 81 MG tablet Take 81 mg by mouth daily.    Marland Kitchen atorvastatin (LIPITOR) 40 MG tablet Take 1 tablet (40 mg total) by mouth at bedtime. 90 tablet 3  . Blood Glucose Calibration (ACCU-CHEK AVIVA) SOLN For use of calibration of meter 1 each prn  . fenofibrate 160 MG tablet Take 1 tablet (160 mg total) by mouth daily. 90 tablet 3  . FLUoxetine (PROZAC) 20 MG capsule Take 1 capsule (20 mg total) by mouth daily. 90 capsule 1  . fluticasone (FLONASE) 50 MCG/ACT nasal spray USE 2 SPRAYS INTO BOTH NOSTRILS DAILY. (Patient taking differently: as needed. ) 48 g 3  . metFORMIN (GLUCOPHAGE-XR) 500 MG 24 hr tablet Take 2 tablets (1,000 mg total) by mouth daily with breakfast. 180 tablet 3  . metoprolol tartrate (LOPRESSOR) 50 MG tablet TAKE 1 TABLET TWICE DAILY (Patient taking differently: Take 50 mg by mouth daily. ) 180 tablet 1  . Multiple Vitamin (MULTIVITAMIN WITH MINERALS) TABS tablet Take 1 tablet by mouth daily.    Marland Kitchen oxybutynin (DITROPAN) 5 MG tablet Take 1 tablet (5 mg total) by mouth daily. (Patient taking differently: Take 5 mg by mouth 2 (two) times daily. ) 90 tablet 3  . terazosin (HYTRIN) 2 MG capsule Take 1 capsule (2 mg total) by mouth at bedtime. 90 capsule 2   No facility-administered medications prior to visit.     No Known Allergies  ROS Review of Systems    Objective:    Physical Exam  Temp 98.2 F (36.8 C)   Ht 5\' 7"  (1.702 m)   Wt 189 lb (85.7 kg)   BMI 29.60 kg/m  Wt Readings from Last 3 Encounters:  04/26/19 189 lb (85.7 kg)  02/14/19 189 lb (85.7 kg)  11/06/18 193 lb (87.5 kg)     Health Maintenance Due  Topic Date Due  . COLONOSCOPY  06/24/2016  . FOOT EXAM  02/04/2019  . INFLUENZA  VACCINE  04/07/2019  . HEMOGLOBIN A1C  04/16/2019    There are no preventive care reminders to display for this  patient.  Lab Results  Component Value Date   TSH 3.31 04/05/2016   Lab Results  Component Value Date   WBC 7.9 09/30/2017   HGB 14.8 09/30/2017   HCT 42.7 09/30/2017   MCV 85 09/30/2017   PLT 225 09/30/2017   Lab Results  Component Value Date   NA 139 10/16/2018   K 4.8 10/16/2018   CO2 29 10/16/2018   GLUCOSE 95 10/16/2018   BUN 21 10/16/2018   CREATININE 1.11 10/16/2018   BILITOT 0.4 10/16/2018   ALKPHOS 54 09/30/2017   AST 32 10/16/2018   ALT 31 10/16/2018   PROT 6.7 10/16/2018   ALBUMIN 4.3 09/30/2017   CALCIUM 10.2 10/16/2018   ANIONGAP 8 03/01/2017   Lab Results  Component Value Date   CHOL 114 10/16/2018   Lab Results  Component Value Date   HDL 21 (L) 10/16/2018   Lab Results  Component Value Date   LDLCALC 65 10/16/2018   Lab Results  Component Value Date   TRIG 216 (H) 10/16/2018   Lab Results  Component Value Date   CHOLHDL 5.4 (H) 10/16/2018   Lab Results  Component Value Date   HGBA1C 6.1 (A) 10/16/2018      Assessment & Plan:   Problem List Items Addressed This Visit    None    Visit Diagnoses    Nocturia    -  Primary   Relevant Orders   Urinalysis, Routine w reflex microscopic   Urine Culture   Ambulatory referral to Urology   Benign prostatic hyperplasia with nocturia       Relevant Medications   finasteride (PROSCAR) 5 MG tablet   Other Relevant Orders   Ambulatory referral to Urology      BPH with nocturia-did recommend that we at least do a urinalysis and culture just to rule out infection though I suspect this is probably related to his prostate.  We also discussed adding an alpha reductase inhibitor to his current regimen in addition to getting him in with urology for consultation.  He may be at the point where he might need some type of surgical intervention.  Meds ordered this encounter  Medications   . finasteride (PROSCAR) 5 MG tablet    Sig: Take 1 tablet (5 mg total) by mouth daily.    Dispense:  30 tablet    Refill:  0    Follow-up: No follow-ups on file.    I discussed the assessment and treatment plan with the patient. The patient was provided an opportunity to ask questions and all were answered. The patient agreed with the plan and demonstrated an understanding of the instructions.   The patient was advised to call back or seek an in-person evaluation if the symptoms worsen or if the condition fails to improve as anticipated.   Beatrice Lecher, MD

## 2019-04-27 LAB — BASIC METABOLIC PANEL WITH GFR
BUN/Creatinine Ratio: 16 (calc) (ref 6–22)
BUN: 19 mg/dL (ref 7–25)
CO2: 28 mmol/L (ref 20–32)
Calcium: 10 mg/dL (ref 8.6–10.3)
Chloride: 103 mmol/L (ref 98–110)
Creat: 1.22 mg/dL — ABNORMAL HIGH (ref 0.70–1.18)
GFR, Est African American: 67 mL/min/{1.73_m2} (ref >=60)
GFR, Est Non African American: 58 mL/min/{1.73_m2} — ABNORMAL LOW (ref >=60)
Glucose, Bld: 96 mg/dL (ref 65–99)
Potassium: 4.7 mmol/L (ref 3.5–5.3)
Sodium: 138 mmol/L (ref 135–146)

## 2019-04-27 LAB — HEMOGLOBIN A1C
Hgb A1c MFr Bld: 6.1 % of total Hgb — ABNORMAL HIGH (ref <5.7)
Mean Plasma Glucose: 128 (calc)
eAG (mmol/L): 7.1 (calc)

## 2019-04-27 MED ORDER — SULFAMETHOXAZOLE-TRIMETHOPRIM 800-160 MG PO TABS: 1.0000 | ORAL_TABLET | Freq: Two times a day (BID) | ORAL | 0 refills | Status: DC

## 2019-04-27 NOTE — Addendum Note (Signed)
Addended by: Beatrice Lecher D on: 04/27/2019 07:55 AM   Modules accepted: Orders

## 2019-04-28 LAB — URINALYSIS, ROUTINE W REFLEX MICROSCOPIC
Bilirubin Urine: NEGATIVE
Glucose, UA: NEGATIVE
Hyaline Cast: NONE SEEN /LPF
Ketones, ur: NEGATIVE
Nitrite: POSITIVE — AB
Specific Gravity, Urine: 1.019 (ref 1.001–1.03)
Squamous Epithelial / LPF: NONE SEEN /HPF (ref <=5)
WBC, UA: >=60 /HPF — AB (ref 0–5)
pH: <=5 (ref 5.0–8.0)

## 2019-04-28 LAB — URINE CULTURE
MICRO NUMBER:: 793934
SPECIMEN QUALITY:: ADEQUATE

## 2019-04-30 MED ORDER — AMOXICILLIN-POT CLAVULANATE 875-125 MG PO TABS: 1.0000 | ORAL_TABLET | Freq: Two times a day (BID) | ORAL | 0 refills | Status: DC

## 2019-04-30 NOTE — Addendum Note (Signed)
Addended by: Beatrice Lecher D on: 04/30/2019 09:28 PM   Modules accepted: Orders

## 2019-05-01 ENCOUNTER — Other Ambulatory Visit: Payer: Self-pay | Admitting: *Deleted

## 2019-05-22 ENCOUNTER — Telehealth: Payer: Self-pay

## 2019-05-22 DIAGNOSIS — R351 Nocturia: Secondary | ICD-10-CM

## 2019-05-22 DIAGNOSIS — N401 Enlarged prostate with lower urinary tract symptoms: Secondary | ICD-10-CM

## 2019-05-22 MED ORDER — FINASTERIDE 5 MG PO TABS: 5.0000 mg | ORAL_TABLET | Freq: Every day | ORAL | 3 refills | Status: DC

## 2019-05-22 NOTE — Telephone Encounter (Signed)
Patient called stating that he feels Finasteride worked well for him. Wants to know if OK for 90 day supply to be sent to mail order?  RX pended

## 2019-05-22 NOTE — Telephone Encounter (Signed)
rx sent to mail order 

## 2019-05-23 ENCOUNTER — Other Ambulatory Visit: Payer: Self-pay

## 2019-05-23 ENCOUNTER — Ambulatory Visit (INDEPENDENT_AMBULATORY_CARE_PROVIDER_SITE_OTHER): Payer: Medicare Other | Admitting: Family Medicine

## 2019-05-23 DIAGNOSIS — Z23 Encounter for immunization: Secondary | ICD-10-CM

## 2019-05-23 NOTE — Telephone Encounter (Signed)
Left pt msg advising RX was sent

## 2019-06-07 ENCOUNTER — Other Ambulatory Visit: Payer: Self-pay | Admitting: *Deleted

## 2019-06-07 DIAGNOSIS — E119 Type 2 diabetes mellitus without complications: Secondary | ICD-10-CM

## 2019-06-07 MED ORDER — FLUOXETINE HCL 20 MG PO CAPS: 20.0000 mg | ORAL_CAPSULE | Freq: Every day | ORAL | 1 refills | Status: DC

## 2019-06-22 ENCOUNTER — Other Ambulatory Visit: Payer: Self-pay | Admitting: Family Medicine

## 2019-06-22 DIAGNOSIS — E785 Hyperlipidemia, unspecified: Secondary | ICD-10-CM

## 2019-07-03 DIAGNOSIS — R350 Frequency of micturition: Secondary | ICD-10-CM | POA: Diagnosis not present

## 2019-07-10 DIAGNOSIS — I10 Essential (primary) hypertension: Secondary | ICD-10-CM | POA: Diagnosis not present

## 2019-07-10 DIAGNOSIS — I2510 Atherosclerotic heart disease of native coronary artery without angina pectoris: Secondary | ICD-10-CM | POA: Diagnosis not present

## 2019-07-10 DIAGNOSIS — Z952 Presence of prosthetic heart valve: Secondary | ICD-10-CM | POA: Diagnosis not present

## 2019-07-10 DIAGNOSIS — Z951 Presence of aortocoronary bypass graft: Secondary | ICD-10-CM | POA: Diagnosis not present

## 2019-07-28 ENCOUNTER — Other Ambulatory Visit: Payer: Self-pay | Admitting: Adult Health

## 2019-07-28 MED ORDER — OXYBUTYNIN CHLORIDE 5 MG PO TABS: 5.0000 mg | ORAL_TABLET | Freq: Two times a day (BID) | ORAL | 0 refills | Status: DC

## 2019-07-28 NOTE — Progress Notes (Signed)
Pt called After Hours very concerned that he was out of town for one week without his Oxy[place]in Per After Hrs RN- pt has been on RX >18 months, tolerates well, only SE is dry mouth. I sent in 30 tablets [facility].

## 2019-08-06 ENCOUNTER — Other Ambulatory Visit: Payer: Self-pay | Admitting: Family Medicine

## 2019-08-06 DIAGNOSIS — E119 Type 2 diabetes mellitus without complications: Secondary | ICD-10-CM

## 2019-09-06 ENCOUNTER — Encounter: Payer: Self-pay | Admitting: Physician Assistant

## 2019-09-06 ENCOUNTER — Other Ambulatory Visit: Payer: Self-pay

## 2019-09-06 ENCOUNTER — Ambulatory Visit (INDEPENDENT_AMBULATORY_CARE_PROVIDER_SITE_OTHER): Payer: Medicare Other | Admitting: Physician Assistant

## 2019-09-06 VITALS — BP 148/92 | HR 64 | Temp 98.1°F | Ht 67.0 in | Wt 195.0 lb

## 2019-09-06 DIAGNOSIS — M25511 Pain in right shoulder: Secondary | ICD-10-CM | POA: Diagnosis not present

## 2019-09-06 DIAGNOSIS — I2510 Atherosclerotic heart disease of native coronary artery without angina pectoris: Secondary | ICD-10-CM

## 2019-09-06 DIAGNOSIS — R0602 Shortness of breath: Secondary | ICD-10-CM | POA: Diagnosis not present

## 2019-09-06 DIAGNOSIS — E1159 Type 2 diabetes mellitus with other circulatory complications: Secondary | ICD-10-CM | POA: Diagnosis not present

## 2019-09-06 DIAGNOSIS — L821 Other seborrheic keratosis: Secondary | ICD-10-CM | POA: Diagnosis not present

## 2019-09-06 DIAGNOSIS — R030 Elevated blood-pressure reading, without diagnosis of hypertension: Secondary | ICD-10-CM

## 2019-09-06 DIAGNOSIS — S4991XA Unspecified injury of right shoulder and upper arm, initial encounter: Secondary | ICD-10-CM | POA: Diagnosis not present

## 2019-09-06 DIAGNOSIS — J8410 Pulmonary fibrosis, unspecified: Secondary | ICD-10-CM

## 2019-09-06 LAB — POCT UA - MICROALBUMIN
Albumin/Creatinine Ratio, Urine, POC: <30
Creatinine, POC: 200 mg/dL
Microalbumin Ur, POC: 10 mg/L

## 2019-09-06 LAB — POCT GLYCOSYLATED HEMOGLOBIN (HGB A1C): Hemoglobin A1C: 6 % — AB (ref 4.0–5.6)

## 2019-09-06 MED ORDER — MELOXICAM 15 MG PO TABS: 15.0000 mg | ORAL_TABLET | Freq: Every day | ORAL | 0 refills | Status: DC

## 2019-09-06 MED ORDER — PREDNISONE 20 MG PO TABS: ORAL_TABLET | ORAL | 0 refills | Status: DC

## 2019-09-06 NOTE — Patient Instructions (Addendum)
Rotator Cuff Tendinitis  Rotator cuff tendinitis is inflammation of the tough, cord-like bands that connect muscle to bone (tendons) in the rotator cuff. The rotator cuff includes all of the muscles and tendons that connect the arm to the shoulder. The rotator cuff holds the head of the upper arm bone (humerus) in the cup (fossa) of the shoulder blade (scapula). This condition can lead to a long-lasting (chronic) tear. The tear may be partial or complete. What are the causes? This condition is usually caused by overusing the rotator cuff. What increases the risk? This condition is more likely to develop in athletes and workers who frequently use their shoulder or reach over their heads. This can include activities such as:  Tennis.  Baseball or softball.  Swimming.  Construction work.  Painting. What are the signs or symptoms? Symptoms of this condition include:  Pain spreading (radiating) from the shoulder to the upper arm.  Swelling and tenderness in front of the shoulder.  Pain when reaching, pulling, or lifting the arm above the head.  Pain when lowering the arm from above the head.  Minor pain in the shoulder when resting.  Increased pain in the shoulder at night.  Difficulty placing the arm behind the back. How is this diagnosed? This condition is diagnosed with a medical history and physical exam. Tests may also be done, including:  X-rays.  MRI.  Ultrasounds.  CT or MR arthrogram. During this test, a contrast material is injected and then images are taken. How is this treated? Treatment for this condition depends on the severity of the condition. In less severe cases, treatment may include:  Rest. This may be done with a sling that holds the shoulder still (immobilization). Your health care provider may also recommend avoiding activities that involve lifting your arm over your head.  Icing the shoulder.  Anti-inflammatory medicines, such as aspirin or  ibuprofen. In more severe cases, treatment may include:  Physical therapy.  Steroid injections.  Surgery. Follow these instructions at home: If you have a sling:  Wear the sling as told by your health care provider. Remove it only as told by your health care provider.  Loosen the sling if your fingers tingle, become numb, or turn cold and blue.  Keep the sling clean.  If the sling is not waterproof, do not let it get wet. Remove it, if allowed, or cover it with a watertight covering when you take a bath or shower. Managing pain, stiffness, and swelling  If directed, put ice on the injured area. ? If you have a removable sling, remove it as told by your health care provider. ? Put ice in a plastic bag. ? Place a towel between your skin and the bag. ? Leave the ice on for 20 minutes, 2-3 times a day.  Move your fingers often to avoid stiffness and to lessen swelling.  Raise (elevate) the injured area above the level of your heart while you are lying down.  Find a comfortable sleeping position or sleep on a recliner, if available. Driving  Do not drive or use heavy machinery while taking prescription pain medicine.  Ask your health care provider when it is safe to drive if you have a sling on your arm. Activity  Rest your shoulder as told by your health care provider.  Return to your normal activities as told by your health care provider. Ask your health care provider what activities are safe for you.  Do any exercises or stretches as   told by your health care provider.  If you do repetitive overhead tasks, take small breaks in between and include stretching exercises as told by your health care provider. General instructions  Do not use any products that contain nicotine or tobacco, such as cigarettes and e-cigarettes. These can delay healing. If you need help quitting, ask your health care provider.  Take over-the-counter and prescription medicines only as told by your  health care provider.  Keep all follow-up visits as told by your health care provider. This is important. Contact a health care provider if:  Your pain gets worse.  You have new pain in your arm, hands, or fingers.  Your pain is not relieved with medicine or does not get better after 6 weeks of treatment.  You have cracking sensations when moving your shoulder in certain directions.  You hear a snapping sound after using your shoulder, followed by severe pain and weakness. Get help right away if:  Your arm, hand, or fingers are numb or tingling.  Your arm, hand, or fingers are swollen or painful or they turn white or blue. Summary  Rotator cuff tendinitis is inflammation of the tough, cord-like bands that connect muscle to bone (tendons) in the rotator cuff.  This condition is usually caused by overusing the rotator cuff, which includes all of the muscles and tendons that connect the arm to the shoulder.  This condition is more likely to develop in athletes and workers who frequently use their shoulder or reach over their heads.  Treatment generally includes rest, anti-inflammatory medicines, and icing. In some cases, physical therapy and steroid injections may be needed. In severe cases, surgery may be needed. This information is not intended to replace advice given to you by your health care provider. Make sure you discuss any questions you have with your health care provider. Document Revised: 12/15/2018 Document Reviewed: 08/09/2016 Elsevier Patient Education  2020 [place].    Pulmonary Fibrosis  Pulmonary fibrosis is a type of lung disease that causes scarring. Over time, the scar tissue builds up in the air sacs of your lungs (alveoli). This makes it hard for you to breathe. Less oxygen can get into your blood. Scarring from pulmonary fibrosis gets worse over time. This damage is permanent and may lead to other serious health problems. What are the causes? There are  many different causes of pulmonary fibrosis. Sometimes the cause is not known. This is called idiopathic pulmonary fibrosis. Other causes include:  Exposure to chemicals and substances found in agricultural, farm, Architect, or factory work. These include mold, asbestos, silica, metal dusts, and toxic fumes.  Sarcoidosis. In this disease, areas of inflammatory cells (granulomas) form and most often affect the lungs.  Autoimmune diseases. These include diseases such as rheumatoid arthritis, systemic sclerosis, or connective tissue disease.  Taking certain medicines. These include drugs used in radiation therapy or used to treat seizures, heart problems, and some infections. What increases the risk? You are more likely to develop this condition if:  You have a family history of the disease.  You are older. The condition is more common in older adults.  You have a history of smoking.  You have a job that exposes you to certain chemicals.  You have gastroesophageal reflux disease (GERD). What are the signs or symptoms? Symptoms of this condition include:  Difficulty breathing that gets worse with activity.  Shortness of breath (dyspnea).  Dry, hacking cough.  Rapid, shallow breathing during exercise or while at rest.  Bluish skin and lips.  Loss of appetite.  Weakness.  Weight loss and fatigue.  Rounded and enlarged fingertips (clubbing). How is this diagnosed? This condition may be diagnosed based on:  Your symptoms and medical history.  A physical exam. You may also have tests, including:  A test that involves looking inside your lungs with an instrument (bronchoscopy).  Imaging studies of your lungs and heart.  Tests to measure how well you are breathing (pulmonary function tests).  Blood tests.  Tests to see how well your lungs work while you are walking (pulmonary stress test).  A procedure to remove a lung tissue sample to look at it under a microscope  (biopsy). How is this treated? There is no cure for pulmonary fibrosis. Treatment focuses on managing symptoms and preventing scarring from getting worse. This may include:  Medicines, such as: ? Steroids to prevent permanent lung changes. ? Medicines to suppress your body's defense system (immune system). ? Medicines to help with lung function by reducing inflammation or scarring.  Ongoing monitoring with X-rays and lab work.  Oxygen therapy.  Pulmonary rehabilitation.  Surgery. In some cases, a lung transplant is possible. Follow these instructions at home:     Medicines  Take over-the-counter and prescription medicines only as told by your health care provider.  Keep your vaccinations up to date as recommended by your health care provider. General instructions  Do not use any products that contain nicotine or tobacco, such as cigarettes and e-cigarettes. If you need help quitting, ask your health care provider.  Get regular exercise, but do not overexert yourself. Ask your health care provider to suggest some activities that are safe for you to do. ? If you have physical limitations, you may get exercise by walking, using a stationary bike, or doing chair exercises. ? Ask your health care provider about using oxygen while exercising.  If you are exposed to chemicals and substances at work, make sure that you wear a mask or respirator at all times.  Join a pulmonary rehabilitation program or a support group for people with pulmonary fibrosis.  Eat small meals often so you do not get too full. Overeating can make breathing trouble worse.  Maintain a healthy weight. Lose weight if you need to.  Do breathing exercises as directed by your health care provider.  Keep all follow-up visits as told by your health care provider. This is important. Contact a health care provider if you:  Have symptoms that do not get better with medicines.  Are not able to be as active as  usual.  Have trouble taking a deep breath.  Have a fever or chills.  Have blue lips or skin.  Have clubbing of your fingers. Get help right away if you:  Have a sudden worsening of your symptoms.  Have chest pain.  Cough up mucus that is dark in color.  Have a lot of headaches.  Get very confused or sleepy. Summary  Pulmonary fibrosis is a type of lung disease that causes scar tissue to build up in the air sacs of your lungs (alveoli) over time. Less oxygen can get into your blood. This makes it hard for you to breathe.  Scarring from pulmonary fibrosis gets worse over time. This damage is permanent and may lead to other serious health problems.  You are more likely to develop this condition if you have a family history of the condition or a job that exposes you to certain chemicals.  There is  no cure for pulmonary fibrosis. Treatment focuses on managing symptoms and preventing scarring from getting worse. This information is not intended to replace advice given to you by your health care provider. Make sure you discuss any questions you have with your health care provider. Document Revised: 09/28/2017 Document Reviewed: 09/28/2017 Elsevier Patient Education  2020 [employer].

## 2019-09-06 NOTE — Progress Notes (Signed)
Subjective:    Patient ID: Todd Parsons, male    DOB: 1946/01/26, 73 y.o.   MRN: [id number]  HPI  Pt is a 73 yo male with T2DM, CAD, pulmonary fibrosis who presents to the clinic for follow up, medication refill and to discuss worsening SOB with exertion.   Pt is not checking sugars for DM. He is taking metformin daily. He is very active and loves walking. No open wounds or sores. No hypoglycemic events.   He denies any new CP, palpitations, headaches or dizziness. He has a cardiologist. He had echo back in nov 2020 and was normal with great EF.   Pt has noticed more SOB with exertion in last few months. Normally he can walk up multiple steps without getting SOB. Now after one step he is SOB. Denies any cough other than occasional dry cough. He does have pulmonary fibrosis. Not seen pulmonology since 2018. Stopped smoking 40 years ago at 73. 2018 PFT showed severe restriction. Declined bronchoscopy and pulmonary rehab.   2018 Severe ristriction 2018 FVC 53 percent TLC56 percent DLCO 54 percent  He would like a skin nevus looked at above right eye.   He is also having right shoulder pain for 1 week after landing on it playing pickle ball. Hx of AC seperation in the past. No radicular pain. No strength changes. Pain with playing pickle ball. No neck pain.   .. Active Ambulatory Problems    Diagnosis Date Noted  . Essential hypertension, benign 05/05/2011  . Hyperlipidemia LDL goal <100 05/05/2011  . OSA (obstructive sleep apnea) 05/05/2011  . Congenital insufficiency of aortic valve 05/05/2011  . History of bicuspid aortic valve 05/05/2011  . Vitamin D deficiency 05/05/2011  . Hx of arthroscopy of knee 06/08/2011  . Restrictive airway disease 08/09/2014  . Type 2 diabetes mellitus with cardiac complication ([place]) [id number]  . H/O aortic valve replacement 09/15/2015  . Atrial flutter ([place]) 09/15/2015  . Pulmonary fibrosis, unspecified ([place]) 12/22/2015  . Primary osteoarthritis of  both knees 02/10/2016  . OAB (overactive bladder) 04/02/2016  . Wears hearing aid 06/02/2016  . CAD (coronary artery disease) 04/15/2017  . Coughing 11/06/2018  . Bilateral hearing loss due to cerumen impaction 11/06/2018  . SOB (shortness of breath) on exertion 09/10/2019  . Seborrheic keratoses 09/10/2019  . Acute pain of right shoulder 09/10/2019  . Elevated blood pressure reading 09/10/2019   Resolved Ambulatory Problems    Diagnosis Date Noted  . 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 Osteoarthritis of left knee 05/19/2012  . Bug bite 06/15/2012  . Osteoarthritis of right knee 07/26/2012  . Long term current use of anticoagulant therapy 10/17/2015  . Lung crackles 12/10/2015  . Viral URI 12/10/2015  . Primary osteoarthritis of right knee 02/28/2017   Past Medical History:  Diagnosis Date  . Arthritis   . Bicuspid aortic valve   . Coronary artery disease   . Dysrhythmia   . Echocardiogram with ECG monitoring 01/12/2010  . Hyperlipidemia   . Hypertension   . Pre-diabetes   . Sleep apnea         Review of Systems     Objective:   Physical Exam Vitals reviewed.  Constitutional:      Appearance: Normal appearance.  Cardiovascular:     Rate and Rhythm: Normal rate and regular rhythm.     Pulses: Normal pulses.     Heart sounds: Murmur present.  Pulmonary:     Effort: Pulmonary effort is normal.  Breath sounds: Normal breath sounds.  Musculoskeletal:     Comments: NROM of neck.  NROM of right shoulder with some pain with abduction.  No tenderness over acromion, bicep tendon insertion, bursa.  Tightness and tenderness over supraspinatus.  No tenderness over cspine.  Negative drop arm.  Discomfort with internal and external ROM.  Strength of bilateral upper ext 5/5.   Skin:    Comments: 76mm raised wart like papule above eyebrows of right eye.   Neurological:     General: No focal deficit present.     Mental Status: He is alert and oriented to  person, place, and time.  Psychiatric:        Mood and Affect: Mood normal.           Assessment & Plan:  Marland KitchenMarland KitchenDiagnoses and all orders for this visit:  SOB (shortness of breath) on exertion -     predniSONE (DELTASONE) 20 MG tablet; Take 3 tablets for 3 days, take 2 tablets for 3 days, take 1 tablet for 3 days, take 1/2 tablet for 4 days.  Injury of right shoulder, initial encounter -     meloxicam (MOBIC) 15 MG tablet; Take 1 tablet (15 mg total) by mouth daily.  Acute pain of right shoulder -     meloxicam (MOBIC) 15 MG tablet; Take 1 tablet (15 mg total) by mouth daily.  Seborrheic keratoses  Coronary artery disease involving native coronary artery of native heart without angina pectoris  Type 2 diabetes mellitus with cardiac complication (HCC) -     POCT HgB A1C -     POCT UA - Microalbumin  Pulmonary fibrosis, unspecified (HCC) -     predniSONE (DELTASONE) 20 MG tablet; Take 3 tablets for 3 days, take 2 tablets for 3 days, take 1 tablet for 3 days, take 1/2 tablet for 4 days.  Elevated blood pressure reading    .Marland Kitchen Results for orders placed or performed in visit on 09/06/19  POCT HgB A1C  Result Value Ref Range   Hemoglobin A1C 6.0 (A) 4.0 - 5.6 %   HbA1c POC (<> result, manual entry)     HbA1c, POC (prediabetic range)     HbA1c, POC (controlled diabetic range)    POCT UA - Microalbumin  Result Value Ref Range   Microalbumin Ur, POC 10 mg/L   Creatinine, POC 200 mg/dL   Albumin/Creatinine Ratio, Urine, POC <30    A1C is great.  Continue same medications.  On STATIN. BP NOT to goal. He did not take BP medication this morning. He just saw cardiology and normal there. Continue to monitor at home and bring in readings to [facility]. Discussed RISK of CV events with uncontrolled HTN.  .. Diabetic Foot Exam - Simple   Simple Foot Form Visual Inspection No deformities, no ulcerations, no other skin breakdown bilaterally: Yes Sensation Testing Intact to touch and  monofilament testing bilaterally: Yes Pulse Check Posterior Tibialis and Dorsalis pulse intact bilaterally: Yes Comments   microlbumin normal.  Eye exam UTD.  Flu shot UTD. Pneumonia UTD.    SOB- 6 minute walk test O2 dropped below 88 at 70minutes. Discussed patient could qualify for oxygen. He needs follow up with pulmonology. I certainly think this is pulmonary fibrosis worsening. Prednisone burst given to see if would help. ECHO normal. No edema. Lungs sound ok.   Discussed sK of face. Declined cryotherapy today. HO given. If worsening or changing fast come in for another look.   Right shoulder seems like  rotator cuff tendonitis. Strength intact. Start exercises. Mobic for 2 weeks. Ice area. Relative rest for next 2 weeks not overusing shoulder. Follow up as needed with sports medicine.   Marland Kitchen.Spent 45 minutes with patient and greater than 50 percent of visit spent counseling patient regarding treatment plan.

## 2019-09-10 DIAGNOSIS — R030 Elevated blood-pressure reading, without diagnosis of hypertension: Secondary | ICD-10-CM | POA: Insufficient documentation

## 2019-09-10 DIAGNOSIS — L821 Other seborrheic keratosis: Secondary | ICD-10-CM | POA: Insufficient documentation

## 2019-09-10 DIAGNOSIS — R0602 Shortness of breath: Secondary | ICD-10-CM | POA: Insufficient documentation

## 2019-09-10 DIAGNOSIS — M25511 Pain in right shoulder: Secondary | ICD-10-CM | POA: Insufficient documentation

## 2019-09-10 NOTE — Progress Notes (Signed)
Patient was in office for shortness of breath.  He did a walk test and it oxygen was stable for the first few minutes of the walk. Then after 3 or 4 minutes his oxygen level went from 90 to 87 percent and 88 percent. Per provider he was supposed to stop and she would talk with him.

## 2019-10-02 ENCOUNTER — Other Ambulatory Visit: Payer: Self-pay

## 2019-10-02 ENCOUNTER — Encounter: Payer: Self-pay | Admitting: Family Medicine

## 2019-10-02 ENCOUNTER — Ambulatory Visit (INDEPENDENT_AMBULATORY_CARE_PROVIDER_SITE_OTHER): Payer: Medicare Other | Admitting: Family Medicine

## 2019-10-02 VITALS — BP 138/85 | HR 55 | Ht 67.0 in | Wt 193.0 lb

## 2019-10-02 DIAGNOSIS — J3089 Other allergic rhinitis: Secondary | ICD-10-CM | POA: Diagnosis not present

## 2019-10-02 DIAGNOSIS — L570 Actinic keratosis: Secondary | ICD-10-CM

## 2019-10-02 DIAGNOSIS — H6121 Impacted cerumen, right ear: Secondary | ICD-10-CM

## 2019-10-02 MED ORDER — FLUTICASONE PROPIONATE 50 MCG/ACT NA SUSP: 2.0000 | Freq: Every day | NASAL | 3 refills | Status: DC | PRN

## 2019-10-02 NOTE — Progress Notes (Signed)
Established Patient Office Visit  Subjective:  Patient ID: Todd Parsons, male    DOB: 1946-04-29  Age: 74 y.o. MRN: [id number]  CC:  Chief Complaint  Patient presents with  . Ear Fullness    HPI Todd Parsons presents for   Right ear concern- says it feels blocked.  He has noticed he cannot hear as well out of it.  No pain or cold symptoms.  AR - needs a refill on his Flonase.    Lesion on forehead would like treated.  He says it has been there for "a while".  He would not give me a specific timeframe but says he has a really hard time not picking at it.  It gets crusty and then he picks at it and then it just seems to come back.  He admits he is not the best wearing sunscreen.  Past Medical History:  Diagnosis Date  . Arthritis    osteoarthritis  . Bicuspid aortic valve   . Coronary artery disease   . Dysrhythmia    atrial flutter  . Echocardiogram with ECG monitoring 01/12/2010   60-65% EF  . Hyperlipidemia   . Hypertension   . Pre-diabetes   . Sleep apnea     Past Surgical History:  Procedure Laterality Date  . A-FLUTTER ABLATION  2017  . ACROMIO-CLAVICULAR JOINT REPAIR Right 1980s      . apnea device implant     then removed.    Marland Kitchen CARDIAC CATHETERIZATION  07/2015  . CARDIAC VALVE REPLACEMENT  08/05/2015   Aortic valve  . CORONARY ARTERY BYPASS GRAFT  08/05/2015  . left knee arthoscopy    . NOSE SURGERY  late 50's   to fix a broken nose  . TONSILECTOMY, ADENOIDECTOMY, BILATERAL MYRINGOTOMY AND TUBES    . TOTAL KNEE ARTHROPLASTY Right 02/28/2017   Procedure: RIGHT TOTAL KNEE ARTHROPLASTY;  Surgeon: Dorna Leitz, MD;  Location: [facility];  Service: Orthopedics;  Laterality: Right;    Family History  Problem Relation Age of Onset  . Heart disease Father 93       bypass surgery  . Stroke Mother 62  . Cancer Mother        upper palate    Social History   Socioeconomic History  . Marital status: Married    Spouse name:  [name]  . Number of children: 3   . Years of education: 59  . Highest education level: Master's degree (e.g., MA, MS, MEng, MEd, MSW, MBA)  Occupational History  . Occupation: [employer] [place]: retired  Tobacco Use  . Smoking status: Former Smoker    Quit date: 04/24/1978    Years since quitting: 41.4  . Smokeless tobacco: Never Used  Substance and Sexual Activity  . Alcohol use: Yes    Alcohol/week: 2.0 standard drinks    Types: 2 Standard drinks or equivalent per week    Comment: socially  . Drug use: No  . Sexual activity: Yes  Other Topics Concern  . Not on file  Social History Narrative   Had 2 surgeries in the past 2 years ago and has not got back into exercising. Will start back with doing this. Wife retiring in April.   Social Determinants of Health   Financial Resource Strain: Low Risk   . Difficulty of Paying Living Expenses: Not hard at all  Food Insecurity: No Food Insecurity  . Worried About Charity fundraiser in the Last Year: Never true  .  Ran Out of Food in the Last Year: Never true  Transportation Needs: No Transportation Needs  . Lack of Transportation (Medical): No  . Lack of Transportation (Non-Medical): No  Physical Activity: Inactive  . Days of Exercise per Week: 0 days  . Minutes of Exercise per Session: 0 min  Stress: No Stress Concern Present  . Feeling of Stress : Not at all  Social Connections: Somewhat Isolated  . Frequency of Communication with Friends and Family: Twice a week  . Frequency of Social Gatherings with Friends and Family: Once a week  . Attends Religious Services: Never  . Active Member of Clubs or Organizations: No  . Attends Archivist Meetings: Never  . Marital Status: Married  Human resources officer Violence: Not At Risk  . Fear of Current or Ex-Partner: No  . Emotionally Abused: No  . Physically Abused: No  . Sexually Abused: No    Outpatient Medications Prior to Visit  Medication Sig Dispense Refill  . aspirin EC 81 MG tablet  Take 81 mg by mouth daily.    Marland Kitchen atorvastatin (LIPITOR) 40 MG tablet Take 1 tablet (40 mg total) by mouth at bedtime. 90 tablet 3  . fenofibrate 160 MG tablet TAKE 1 TABLET BY MOUTH  DAILY 90 tablet 3  . finasteride (PROSCAR) 5 MG tablet Take 1 tablet (5 mg total) by mouth daily. 90 tablet 3  . FLUoxetine (PROZAC) 20 MG capsule Take 1 capsule (20 mg total) by mouth daily. 90 capsule 1  . metFORMIN (GLUCOPHAGE-XR) 500 MG 24 hr tablet TAKE 2 TABLETS BY MOUTH  DAILY WITH BREAKFAST 180 tablet 3  . metoprolol tartrate (LOPRESSOR) 50 MG tablet TAKE 1 TABLET TWICE DAILY (Patient taking differently: Take 50 mg by mouth daily. ) 180 tablet 1  . Multiple Vitamin (MULTIVITAMIN WITH MINERALS) TABS tablet Take 1 tablet by mouth daily.    Marland Kitchen oxybutynin (DITROPAN) 5 MG tablet Take 1 tablet (5 mg total) by mouth 2 (two) times daily. 30 tablet 0  . fluticasone (FLONASE) 50 MCG/ACT nasal spray USE 2 SPRAYS INTO BOTH NOSTRILS DAILY. (Patient taking differently: as needed. ) 48 g 3  . AMBULATORY NON FORMULARY MEDICATION Medication Name: glucometer, meter and strips to test 2 times per week. Dx: Diabetes Type 2. 1 vial 0  . Blood Glucose Calibration (ACCU-CHEK AVIVA) SOLN For use of calibration of meter 1 each prn  . meloxicam (MOBIC) 15 MG tablet Take 1 tablet (15 mg total) by mouth daily. 30 tablet 0  . predniSONE (DELTASONE) 20 MG tablet Take 3 tablets for 3 days, take 2 tablets for 3 days, take 1 tablet for 3 days, take 1/2 tablet for 4 days. 20 tablet 0   No facility-administered medications prior to visit.    No Known Allergies  ROS Review of Systems    Objective:    Physical Exam  BP 138/85   Pulse (!) 55   Ht 5\' 7"  (1.702 m)   Wt 193 lb (87.5 kg)   SpO2 95%   BMI 30.23 kg/m  Wt Readings from Last 3 Encounters:  10/02/19 193 lb (87.5 kg)  09/06/19 195 lb (88.5 kg)  04/26/19 189 lb (85.7 kg)     There are no preventive care reminders to display for this patient.  There are no preventive  care reminders to display for this patient.  Lab Results  Component Value Date   TSH 3.31 04/05/2016   Lab Results  Component Value Date   WBC 7.9 09/30/2017  HGB 14.8 09/30/2017   HCT 42.7 09/30/2017   MCV 85 09/30/2017   PLT 225 09/30/2017   Lab Results  Component Value Date   NA 138 04/26/2019   K 4.7 04/26/2019   CO2 28 04/26/2019   GLUCOSE 96 04/26/2019   BUN 19 04/26/2019   CREATININE 1.22 (H) 04/26/2019   BILITOT 0.4 10/16/2018   ALKPHOS 54 09/30/2017   AST 32 10/16/2018   ALT 31 10/16/2018   PROT 6.7 10/16/2018   ALBUMIN 4.3 09/30/2017   CALCIUM 10.0 04/26/2019   ANIONGAP 8 03/01/2017   Lab Results  Component Value Date   CHOL 114 10/16/2018   Lab Results  Component Value Date   HDL 21 (L) 10/16/2018   Lab Results  Component Value Date   LDLCALC 65 10/16/2018   Lab Results  Component Value Date   TRIG 216 (H) 10/16/2018   Lab Results  Component Value Date   CHOLHDL 5.4 (H) 10/16/2018   Lab Results  Component Value Date   HGBA1C 6.0 (A) 09/06/2019      Assessment & Plan:   Problem List Items Addressed This Visit    None    Visit Diagnoses    Actinic keratosis    -  Primary   Impacted cerumen of right ear       Non-seasonal allergic rhinitis, unspecified trigger       Relevant Medications   fluticasone (FLONASE) 50 MCG/ACT nasal spray     Right ear fullness secondary to cerumen impaction with secondary hearing loss-patient tolerated irrigation well and said he could already hear better.  Tympanic membrane was clear on exam.  Lesion on forehead most consistent with an approximately 2 x 3 mm actinic keratosis-definitive treatment with cryotherapy offered.  Patient tolerated procedure well.  Follow-up wound care discussed.  Meds ordered this encounter  Medications  . fluticasone (FLONASE) 50 MCG/ACT nasal spray    Sig: Place 2 sprays into both nostrils daily as needed for allergies or rhinitis.    Dispense:  48 g    Refill:  3     Follow-up: Return in about 3 months (around 12/31/2019) for Diabetes follow-up.   Cryotherapy Procedure Note  Pre-operative Diagnosis: Actinic keratosis  Post-operative Diagnosis: Actinic keratosis  Locations: right forehead.  Indications: Pre-cancerous lesion  Anesthesia: not required    Procedure Details  Patient informed of risks (permanent scarring, infection, light or dark discoloration, bleeding, infection, weakness, numbness and recurrence of the lesion) and benefits of the procedure and verbal informed consent obtained.  The areas are treated with liquid nitrogen therapy, frozen until ice ball extended 1-2 mm beyond lesion, allowed to thaw, and treated again. The patient tolerated procedure well.  The patient was instructed on post-op care, warned that there may be blister formation, redness and pain. Recommend OTC analgesia as needed for pain.  Condition: Stable  Complications: none.  Plan: 1. Instructed to keep the area dry and covered for 24-48h and clean thereafter. 2. Warning signs of infection were reviewed.   3. Recommended that the patient use OTC acetaminophen as needed for pain.  4. Return PRN.     Indication: Cerumen impaction of the ear(s)  Medical necessity statement: On physical examination, cerumen impairs clinically significant portions of the external auditory canal, and tympanic membrane. Noted obstructive, copious cerumen that cannot be removed without magnification and instrumentations.  Consent: Discussed benefits and risks of procedure and verbal consent obtained  Procedure: Patient was prepped for the procedure. Utilized an otoscope to  assess and take note of the ear canal, the tympanic membrane, and the presence, amount, and placement of the cerumen. Gentle water irrigation and soft plastic curette was utilized to remove cerumen.  Post procedure examination: shows cerumen was completely removed. Patient tolerated procedure well. The patient  is made aware that they may experience temporary vertigo, temporary hearing loss, and temporary discomfort. If these symptom last for more than 24 hours to call the clinic or proceed to the ED.    Beatrice Lecher, MD

## 2019-10-08 ENCOUNTER — Ambulatory Visit: Payer: Medicare Other

## 2019-10-29 DIAGNOSIS — R350 Frequency of micturition: Secondary | ICD-10-CM | POA: Diagnosis not present

## 2019-10-30 ENCOUNTER — Other Ambulatory Visit: Payer: Self-pay | Admitting: Family Medicine

## 2019-11-04 ENCOUNTER — Ambulatory Visit: Payer: Medicare Other | Attending: Internal Medicine

## 2019-11-04 ENCOUNTER — Other Ambulatory Visit: Payer: Self-pay

## 2019-11-04 DIAGNOSIS — Z23 Encounter for immunization: Secondary | ICD-10-CM

## 2019-11-04 NOTE — Progress Notes (Signed)
   Covid-19 Vaccination Clinic  Name:  Todd Parsons    MRN: [id number] DOB: [birth year]  11/04/2019  Todd Parsons was observed post Covid-19 immunization for 15 minutes without incidence. He was provided with Vaccine Information Sheet and instruction to access the V-Safe system.   Todd Parsons was instructed to call 911 with any severe reactions post vaccine: Marland Kitchen Difficulty breathing  . Swelling of your face and throat  . A fast heartbeat  . A bad rash all over your body  . Dizziness and weakness    Immunizations Administered    Name Date Dose VIS Date Route   Pfizer COVID-19 Vaccine 11/04/2019  9:05 AM 0.3 mL 08/17/2019 Intramuscular   Manufacturer: [place]   Lot: [id number]   [place]: [id number]

## 2019-11-15 ENCOUNTER — Other Ambulatory Visit: Payer: Self-pay | Admitting: Family Medicine

## 2019-11-15 DIAGNOSIS — E119 Type 2 diabetes mellitus without complications: Secondary | ICD-10-CM

## 2019-11-28 ENCOUNTER — Ambulatory Visit: Payer: Medicare Other | Attending: Internal Medicine

## 2019-11-28 DIAGNOSIS — Z23 Encounter for immunization: Secondary | ICD-10-CM

## 2019-11-28 NOTE — Progress Notes (Signed)
   Covid-19 Vaccination Clinic  Name:  Todd Parsons    MRN: [id number] DOB: 04-24-1946  11/28/2019  [name] was observed post Covid-19 immunization for 15 minutes without incident. He was provided with Vaccine Information Sheet and instruction to access the V-Safe system.   [name] was instructed to call 911 with any severe reactions post vaccine: Marland Kitchen Difficulty breathing  . Swelling of face and throat  . A fast heartbeat  . A bad rash all over body  . Dizziness and weakness   Immunizations Administered    Name Date Dose VIS Date Route   Pfizer COVID-19 Vaccine 11/28/2019 11:19 AM 0.3 mL 08/17/2019 Intramuscular   Manufacturer: [place]   Lot: [id number]   [place]: [id number]

## 2019-12-31 ENCOUNTER — Other Ambulatory Visit: Payer: Self-pay | Admitting: Family Medicine

## 2019-12-31 ENCOUNTER — Encounter: Payer: Self-pay | Admitting: Family Medicine

## 2019-12-31 ENCOUNTER — Ambulatory Visit (INDEPENDENT_AMBULATORY_CARE_PROVIDER_SITE_OTHER): Payer: Medicare Other | Admitting: Family Medicine

## 2019-12-31 VITALS — BP 117/81 | HR 70 | Ht 67.0 in | Wt 189.0 lb

## 2019-12-31 DIAGNOSIS — E1159 Type 2 diabetes mellitus with other circulatory complications: Secondary | ICD-10-CM

## 2019-12-31 DIAGNOSIS — N3281 Overactive bladder: Secondary | ICD-10-CM

## 2019-12-31 DIAGNOSIS — I10 Essential (primary) hypertension: Secondary | ICD-10-CM

## 2019-12-31 DIAGNOSIS — J3089 Other allergic rhinitis: Secondary | ICD-10-CM

## 2019-12-31 DIAGNOSIS — Z125 Encounter for screening for malignant neoplasm of prostate: Secondary | ICD-10-CM

## 2019-12-31 LAB — POCT GLYCOSYLATED HEMOGLOBIN (HGB A1C): Hemoglobin A1C: 5.9 % — AB (ref 4.0–5.6)

## 2019-12-31 MED ORDER — FLUTICASONE PROPIONATE 50 MCG/ACT NA SUSP: 2.0000 | Freq: Every day | NASAL | 3 refills | Status: DC | PRN

## 2019-12-31 NOTE — Assessment & Plan Note (Addendum)
Well controlled. Continue current regimen. Follow up in  4 mo. discussed the importance of getting back into regular exercise he admits he has been pretty sedentary over the winter months and his gym has been closed.  His pulmonologist had actually recommended pulmonary rehab but he really feels like he just needs to get back into his exercise routine.  Just encouraged [facility] his silver sneakers should be covered there.  He is also interested in maybe picking up pickleball.

## 2019-12-31 NOTE — Progress Notes (Signed)
Established Patient Office Visit  Subjective:  Patient ID: Todd Parsons, male    DOB: 1945/10/20  Age: 74 y.o. MRN: [id number]  CC:  Chief Complaint  Patient presents with  . Diabetes    HPI Todd Parsons presents for   Diabetes - no hypoglycemic events. No wounds or sores that are not healing well. No increased thirst or urination. Checking glucose at home. Taking medications as prescribed without any side effects.  Hypertension- Pt denies chest pain, SOB, dizziness, or heart palpitations.  Taking meds as directed w/o problems.  Denies medication side effects.      Past Medical History:  Diagnosis Date  . Arthritis    osteoarthritis  . Bicuspid aortic valve   . Coronary artery disease   . Dysrhythmia    atrial flutter  . Echocardiogram with ECG monitoring 01/12/2010   60-65% EF  . Hyperlipidemia   . Hypertension   . Pre-diabetes   . Sleep apnea     Past Surgical History:  Procedure Laterality Date  . A-FLUTTER ABLATION  2017  . ACROMIO-CLAVICULAR JOINT REPAIR Right 1980s      . apnea device implant     then removed.    Marland Kitchen CARDIAC CATHETERIZATION  07/2015  . CARDIAC VALVE REPLACEMENT  08/05/2015   Aortic valve  . CORONARY ARTERY BYPASS GRAFT  08/05/2015  . left knee arthoscopy    . NOSE SURGERY  late 50's   to fix a broken nose  . TONSILECTOMY, ADENOIDECTOMY, BILATERAL MYRINGOTOMY AND TUBES    . TOTAL KNEE ARTHROPLASTY Right 02/28/2017   Procedure: RIGHT TOTAL KNEE ARTHROPLASTY;  Surgeon: Dorna Leitz, MD;  Location: [facility];  Service: Orthopedics;  Laterality: Right;    Family History  Problem Relation Age of Onset  . Heart disease Father 20       bypass surgery  . Stroke Mother 39  . Cancer Mother        upper palate    Social History   Socioeconomic History  . Marital status: Married    Spouse name:  [name]  . Number of children: 3  . Years of education: 16  . Highest education level: Master's degree (e.g., MA, MS, MEng, MEd, MSW, MBA)   Occupational History  . Occupation: [place] [employer] [place]: retired  Tobacco Use  . Smoking status: Former Smoker    Quit date: 04/24/1978    Years since quitting: 41.7  . Smokeless tobacco: Never Used  Substance and Sexual Activity  . Alcohol use: Yes    Alcohol/week: 2.0 standard drinks    Types: 2 Standard drinks or equivalent per week    Comment: socially  . Drug use: No  . Sexual activity: Yes  Other Topics Concern  . Not on file  Social History Narrative   Had 2 surgeries in the past 2 years ago and has not got back into exercising. Will start back with doing this. Wife retiring in April.   Social Determinants of Health   Financial Resource Strain:   . Difficulty of Paying Living Expenses:   Food Insecurity:   . Worried About Charity fundraiser in the Last Year:   . Arboriculturist in the Last Year:   Transportation Needs:   . Film/video editor (Medical):   Marland Kitchen Lack of Transportation (Non-Medical):   Physical Activity:   . Days of Exercise per Week:   . Minutes of Exercise per Session:   Stress:   .  Feeling of Stress :   Social Connections:   . Frequency of Communication with Friends and Family:   . Frequency of Social Gatherings with Friends and Family:   . Attends Religious Services:   . Active Member of Clubs or Organizations:   . Attends Archivist Meetings:   Marland Kitchen Marital Status:   Intimate Partner Violence:   . Fear of Current or Ex-Partner:   . Emotionally Abused:   Marland Kitchen Physically Abused:   . Sexually Abused:     Outpatient Medications Prior to Visit  Medication Sig Dispense Refill  . tamsulosin (FLOMAX) 0.4 MG CAPS capsule Take 0.4 mg by mouth daily.    Marland Kitchen aspirin EC 81 MG tablet Take 81 mg by mouth daily.    Marland Kitchen atorvastatin (LIPITOR) 40 MG tablet TAKE 1 TABLET BY MOUTH AT  BEDTIME 90 tablet 3  . fenofibrate 160 MG tablet TAKE 1 TABLET BY MOUTH  DAILY 90 tablet 3  . finasteride (PROSCAR) 5 MG tablet Take 1 tablet (5 mg total) by  mouth daily. 90 tablet 3  . FLUoxetine (PROZAC) 20 MG capsule TAKE 1 CAPSULE BY MOUTH  DAILY 90 capsule 3  . metFORMIN (GLUCOPHAGE-XR) 500 MG 24 hr tablet TAKE 2 TABLETS BY MOUTH  DAILY WITH BREAKFAST 180 tablet 3  . metoprolol tartrate (LOPRESSOR) 50 MG tablet TAKE 1 TABLET TWICE DAILY (Patient taking differently: Take 50 mg by mouth daily. ) 180 tablet 1  . Multiple Vitamin (MULTIVITAMIN WITH MINERALS) TABS tablet Take 1 tablet by mouth daily.    Marland Kitchen oxybutynin (DITROPAN) 5 MG tablet TAKE 1 TABLET BY MOUTH  DAILY 90 tablet 3  . fluticasone (FLONASE) 50 MCG/ACT nasal spray Place 2 sprays into both nostrils daily as needed for allergies or rhinitis. 48 g 3   No facility-administered medications prior to visit.    No Known Allergies  ROS Review of Systems    Objective:    Physical Exam  Constitutional: He is oriented to person, place, and time. He appears well-developed and well-nourished.  HENT:  Head: Normocephalic and atraumatic.  Cardiovascular: Normal rate, regular rhythm and normal heart sounds.  Pulmonary/Chest: Effort normal and breath sounds normal.  Neurological: He is alert and oriented to person, place, and time.  Skin: Skin is warm and dry.  Psychiatric: He has a normal mood and affect. His behavior is normal.    BP 117/81   Pulse 70   Ht 5\' 7"  (1.702 m)   Wt 189 lb (85.7 kg)   SpO2 94%   BMI 29.60 kg/m  Wt Readings from Last 3 Encounters:  12/31/19 189 lb (85.7 kg)  10/02/19 193 lb (87.5 kg)  09/06/19 195 lb (88.5 kg)     There are no preventive care reminders to display for this patient.  There are no preventive care reminders to display for this patient.  Lab Results  Component Value Date   TSH 3.31 04/05/2016   Lab Results  Component Value Date   WBC 7.9 09/30/2017   HGB 14.8 09/30/2017   HCT 42.7 09/30/2017   MCV 85 09/30/2017   PLT 225 09/30/2017   Lab Results  Component Value Date   NA 138 04/26/2019   K 4.7 04/26/2019   CO2 28  04/26/2019   GLUCOSE 96 04/26/2019   BUN 19 04/26/2019   CREATININE 1.22 (H) 04/26/2019   BILITOT 0.4 10/16/2018   ALKPHOS 54 09/30/2017   AST 32 10/16/2018   ALT 31 10/16/2018   PROT 6.7 10/16/2018  ALBUMIN 4.3 09/30/2017   CALCIUM 10.0 04/26/2019   ANIONGAP 8 03/01/2017   Lab Results  Component Value Date   CHOL 114 10/16/2018   Lab Results  Component Value Date   HDL 21 (L) 10/16/2018   Lab Results  Component Value Date   LDLCALC 65 10/16/2018   Lab Results  Component Value Date   TRIG 216 (H) 10/16/2018   Lab Results  Component Value Date   CHOLHDL 5.4 (H) 10/16/2018   Lab Results  Component Value Date   HGBA1C 5.9 (A) 12/31/2019      Assessment & Plan:   Problem List Items Addressed This Visit      Cardiovascular and Mediastinum   Type 2 diabetes mellitus with cardiac complication ([place]) - Primary    A1c looks great at 5.9 today.  Continue current regimen.  Follow-up in 4 months.      Relevant Orders   POCT glycosylated hemoglobin (Hb A1C) (Completed)   COMPLETE METABOLIC PANEL WITH GFR   Lipid panel   PSA   Essential hypertension, benign    Well controlled. Continue current regimen. Follow up in  4 mo. discussed the importance of getting back into regular exercise he admits he has been pretty sedentary over the winter months and his gym has been closed.  His pulmonologist had actually recommended pulmonary rehab but he really feels like he just needs to get back into his exercise routine.  Just [facility] his silver sneakers should be covered there.  He is also interested in maybe picking up pickleball.      Relevant Orders   COMPLETE METABOLIC PANEL WITH GFR   Lipid panel   PSA     Genitourinary   OAB (overactive bladder)    Doing well with his oxybutynin.  He says is a noticeable difference if he forgets to take the medication.  It does give him a little bit of dry mouth but he usually just uses some of the  over-the-counter products and says that works well.       Other Visit Diagnoses    Non-seasonal allergic rhinitis, unspecified trigger       Relevant Medications   fluticasone (FLONASE) 50 MCG/ACT nasal spray   Screening for prostate cancer       Relevant Orders   PSA      Meds ordered this encounter  Medications  . fluticasone (FLONASE) 50 MCG/ACT nasal spray    Sig: Place 2 sprays into both nostrils daily as needed for allergies or rhinitis.    Dispense:  48 g    Refill:  3    Follow-up: Return in about 4 months (around 05/01/2020) for Hypertension, Diabetes follow-up.    Beatrice Lecher, MD

## 2019-12-31 NOTE — Assessment & Plan Note (Signed)
A1c looks great at 5.9 today.  Continue current regimen.  Follow-up in 4 months.

## 2019-12-31 NOTE — Assessment & Plan Note (Signed)
Doing well with his oxybutynin.  He says is a noticeable difference if he forgets to take the medication.  It does give him a little bit of dry mouth but he usually just uses some of the over-the-counter products and says that works well.

## 2020-01-01 LAB — COMPREHENSIVE METABOLIC PANEL
ALT: 21 IU/L (ref 0–44)
AST: 25 IU/L (ref 0–40)
Albumin/Globulin Ratio: 1.8 (ref 1.2–2.2)
Albumin: 4.4 g/dL (ref 3.7–4.7)
Alkaline Phosphatase: 57 IU/L (ref 39–117)
BUN/Creatinine Ratio: 14 (ref 10–24)
BUN: 15 mg/dL (ref 8–27)
Bilirubin Total: 0.8 mg/dL (ref 0.0–1.2)
CO2: 28 mmol/L (ref 20–29)
Calcium: 9.5 mg/dL (ref 8.6–10.2)
Chloride: 101 mmol/L (ref 96–106)
Creatinine, Ser: 1.08 mg/dL (ref 0.76–1.27)
GFR calc Af Amer: 78 mL/min/{1.73_m2} (ref >59)
GFR calc non Af Amer: 67 mL/min/{1.73_m2} (ref >59)
Globulin, Total: 2.4 g/dL (ref 1.5–4.5)
Glucose: 107 mg/dL — ABNORMAL HIGH (ref 65–99)
Potassium: 4.8 mmol/L (ref 3.5–5.2)
Sodium: 140 mmol/L (ref 134–144)
Total Protein: 6.8 g/dL (ref 6.0–8.5)

## 2020-01-01 LAB — LIPID PANEL W/O CHOL/HDL RATIO
Cholesterol, Total: 118 mg/dL (ref 100–199)
HDL: 34 mg/dL — ABNORMAL LOW (ref >39)
LDL Chol Calc (NIH): 61 mg/dL (ref 0–99)
Triglycerides: 126 mg/dL (ref 0–149)
VLDL Cholesterol Cal: 23 mg/dL (ref 5–40)

## 2020-01-01 LAB — PSA: Prostate Specific Ag, Serum: 0.4 ng/mL (ref 0.0–4.0)

## 2020-01-01 LAB — SPECIMEN STATUS REPORT

## 2020-02-04 ENCOUNTER — Other Ambulatory Visit: Payer: Self-pay | Admitting: Family Medicine

## 2020-02-04 DIAGNOSIS — N401 Enlarged prostate with lower urinary tract symptoms: Secondary | ICD-10-CM

## 2020-04-25 ENCOUNTER — Ambulatory Visit (INDEPENDENT_AMBULATORY_CARE_PROVIDER_SITE_OTHER): Payer: Medicare Other | Admitting: Family Medicine

## 2020-04-25 ENCOUNTER — Encounter: Payer: Self-pay | Admitting: Family Medicine

## 2020-04-25 VITALS — BP 115/68 | HR 57 | Ht 67.0 in | Wt 185.0 lb

## 2020-04-25 DIAGNOSIS — N3281 Overactive bladder: Secondary | ICD-10-CM | POA: Diagnosis not present

## 2020-04-25 DIAGNOSIS — R4789 Other speech disturbances: Secondary | ICD-10-CM

## 2020-04-25 DIAGNOSIS — R3915 Urgency of urination: Secondary | ICD-10-CM

## 2020-04-25 DIAGNOSIS — E1159 Type 2 diabetes mellitus with other circulatory complications: Secondary | ICD-10-CM | POA: Diagnosis not present

## 2020-04-25 DIAGNOSIS — Z974 Presence of external hearing-aid: Secondary | ICD-10-CM

## 2020-04-25 DIAGNOSIS — J8410 Pulmonary fibrosis, unspecified: Secondary | ICD-10-CM

## 2020-04-25 DIAGNOSIS — I10 Essential (primary) hypertension: Secondary | ICD-10-CM | POA: Diagnosis not present

## 2020-04-25 DIAGNOSIS — N401 Enlarged prostate with lower urinary tract symptoms: Secondary | ICD-10-CM | POA: Insufficient documentation

## 2020-04-25 LAB — POCT GLYCOSYLATED HEMOGLOBIN (HGB A1C): Hemoglobin A1C: 5.7 % — AB (ref 4.0–5.6)

## 2020-04-25 MED ORDER — OXYBUTYNIN CHLORIDE 5 MG PO TABS: 5.0000 mg | ORAL_TABLET | Freq: Two times a day (BID) | ORAL | 3 refills | Status: DC

## 2020-04-25 MED ORDER — TAMSULOSIN HCL 0.4 MG PO CAPS: 0.4000 mg | ORAL_CAPSULE | Freq: Every day | ORAL | 3 refills | Status: DC

## 2020-04-25 NOTE — Assessment & Plan Note (Signed)
Unclear but at some point his oxybutynin was changed to daily on a previous prescription in February.  This was not filled directly by me and I am not sure why it was changed.  We will go ahead and send over new prescription to the pharmacy that is corrected for twice daily dosing he is not on the extended release.

## 2020-04-25 NOTE — Assessment & Plan Note (Signed)
Pressure looks phenomenal today continue current regimen.  Labs are up-to-date from April.

## 2020-04-25 NOTE — Progress Notes (Signed)
Established Patient Office Visit  Subjective:  Patient ID: Todd Parsons, male    DOB: 1945-12-09  Age: 74 y.o. MRN: [id number]  CC:  Chief Complaint  Patient presents with  . Diabetes  . Hypertension    HPI Vignesh Willert Ruffino presents for   Diabetes - no hypoglycemic events. No wounds or sores that are not healing well. No increased thirst or urination. Checking glucose at home. Taking medications as prescribed without any side effects.  Hypothyroidism - Taking medication regularly in the AM away from food and vitamins, etc. No recent change to skin, hair, or energy levels.  His wife is concerned about his ability to word find.  She stays very sharp and has a very high vocabulary and she just noticed over the last year that he is having a little bit more difficulty with word finding especially if he is on the telephone no other major issues or concerns.  Though he does worry about developing Alzheimer's.  Does like he stays pretty engaged he reads a lot.  They do state and a lot especially since Covid hit so have not really been around a lot of family or friends of they are getting ready to go visit grandchildren.  So wanted to know if I could refill his tamsulosin this was being written by another prescriber.  He was being followed by urology.  Past Medical History:  Diagnosis Date  . Arthritis    osteoarthritis  . Bicuspid aortic valve   . Coronary artery disease   . Dysrhythmia    atrial flutter  . Echocardiogram with ECG monitoring 01/12/2010   60-65% EF  . Hyperlipidemia   . Hypertension   . Pre-diabetes   . Sleep apnea     Past Surgical History:  Procedure Laterality Date  . A-FLUTTER ABLATION  2017  . ACROMIO-CLAVICULAR JOINT REPAIR Right 1980s      . apnea device implant     then removed.    Marland Kitchen CARDIAC CATHETERIZATION  07/2015  . CARDIAC VALVE REPLACEMENT  08/05/2015   Aortic valve  . CORONARY ARTERY BYPASS GRAFT  08/05/2015  . left knee arthoscopy    . NOSE  SURGERY  late 50's   to fix a broken nose  . TONSILECTOMY, ADENOIDECTOMY, BILATERAL MYRINGOTOMY AND TUBES    . TOTAL KNEE ARTHROPLASTY Right 02/28/2017   Procedure: RIGHT TOTAL KNEE ARTHROPLASTY;  Surgeon: Dorna Leitz, MD;  Location: [facility];  Service: Orthopedics;  Laterality: Right;    Family History  Problem Relation Age of Onset  . Heart disease Father 43       bypass surgery  . Stroke Mother 91  . Cancer Mother        upper palate    Social History   Socioeconomic History  . Marital status: Married    Spouse name:  [name]  . Number of children: 3  . Years of education: 44  . Highest education level: Master's degree (e.g., MA, MS, MEng, MEd, MSW, MBA)  Occupational History  . Occupation: [employer]: retired  Tobacco Use  . Smoking status: Former Smoker    Quit date: 04/24/1978    Years since quitting: 42.0  . Smokeless tobacco: Never Used  Vaping Use  . Vaping Use: Never used  Substance and Sexual Activity  . Alcohol use: Yes    Alcohol/week: 2.0 standard drinks    Types: 2 Standard drinks or equivalent per week    Comment: socially  .  Drug use: No  . Sexual activity: Yes  Other Topics Concern  . Not on file  Social History Narrative   Had 2 surgeries in the past 2 years ago and has not got back into exercising. Will start back with doing this. Wife retiring in April.   Social Determinants of Health   Financial Resource Strain:   . Difficulty of Paying Living Expenses: Not on file  Food Insecurity:   . Worried About Charity fundraiser in the Last Year: Not on file  . Ran Out of Food in the Last Year: Not on file  Transportation Needs:   . Lack of Transportation (Medical): Not on file  . Lack of Transportation (Non-Medical): Not on file  Physical Activity:   . Days of Exercise per Week: Not on file  . Minutes of Exercise per Session: Not on file  Stress:   . Feeling of Stress : Not on file  Social Connections:   . Frequency of  Communication with Friends and Family: Not on file  . Frequency of Social Gatherings with Friends and Family: Not on file  . Attends Religious Services: Not on file  . Active Member of Clubs or Organizations: Not on file  . Attends Archivist Meetings: Not on file  . Marital Status: Not on file  Intimate Partner Violence:   . Fear of Current or Ex-Partner: Not on file  . Emotionally Abused: Not on file  . Physically Abused: Not on file  . Sexually Abused: Not on file    Outpatient Medications Prior to Visit  Medication Sig Dispense Refill  . aspirin EC 81 MG tablet Take 81 mg by mouth daily.    Marland Kitchen atorvastatin (LIPITOR) 40 MG tablet TAKE 1 TABLET BY MOUTH AT  BEDTIME 90 tablet 3  . fenofibrate 160 MG tablet TAKE 1 TABLET BY MOUTH  DAILY 90 tablet 3  . finasteride (PROSCAR) 5 MG tablet TAKE 1 TABLET BY MOUTH  DAILY 90 tablet 3  . FLUoxetine (PROZAC) 20 MG capsule TAKE 1 CAPSULE BY MOUTH  DAILY 90 capsule 3  . fluticasone (FLONASE) 50 MCG/ACT nasal spray Place 2 sprays into both nostrils daily as needed for allergies or rhinitis. 48 g 3  . metFORMIN (GLUCOPHAGE-XR) 500 MG 24 hr tablet TAKE 2 TABLETS BY MOUTH  DAILY WITH BREAKFAST 180 tablet 3  . metoprolol tartrate (LOPRESSOR) 50 MG tablet TAKE 1 TABLET TWICE DAILY (Patient taking differently: Take 50 mg by mouth daily. Take 1 tablet daily.) 180 tablet 1  . Multiple Vitamin (MULTIVITAMIN WITH MINERALS) TABS tablet Take 1 tablet by mouth daily.    Marland Kitchen oxybutynin (DITROPAN) 5 MG tablet TAKE 1 TABLET BY MOUTH  DAILY (Patient taking differently: Take 5 mg by mouth 2 (two) times daily. ) 90 tablet 3  . tamsulosin (FLOMAX) 0.4 MG CAPS capsule Take 0.4 mg by mouth daily.     No facility-administered medications prior to visit.    No Known Allergies  ROS Review of Systems    Objective:    Physical Exam Constitutional:      Appearance: He is well-developed.  HENT:     Head: Normocephalic and atraumatic.  Cardiovascular:      Rate and Rhythm: Normal rate and regular rhythm.     Heart sounds: Normal heart sounds.  Pulmonary:     Effort: Pulmonary effort is normal.     Breath sounds: Normal breath sounds.  Musculoskeletal:     Comments: No lower extremity edema  Skin:    General: Skin is warm and dry.  Neurological:     Mental Status: He is alert and oriented to person, place, and time.  Psychiatric:        Behavior: Behavior normal.     BP 115/68   Pulse (!) 57   Ht 5\' 7"  (1.702 m)   Wt 185 lb (83.9 kg)   SpO2 98%   BMI 28.98 kg/m  Wt Readings from Last 3 Encounters:  04/25/20 185 lb (83.9 kg)  12/31/19 189 lb (85.7 kg)  10/02/19 193 lb (87.5 kg)     Health Maintenance Due  Topic Date Due  . OPHTHALMOLOGY EXAM  09/28/2019  . INFLUENZA VACCINE  04/06/2020    There are no preventive care reminders to display for this patient.  Lab Results  Component Value Date   TSH 3.31 04/05/2016   Lab Results  Component Value Date   WBC 7.9 09/30/2017   HGB 14.8 09/30/2017   HCT 42.7 09/30/2017   MCV 85 09/30/2017   PLT 225 09/30/2017   Lab Results  Component Value Date   NA 140 12/31/2019   K 4.8 12/31/2019   CO2 28 12/31/2019   GLUCOSE 107 (H) 12/31/2019   BUN 15 12/31/2019   CREATININE 1.08 12/31/2019   BILITOT 0.8 12/31/2019   ALKPHOS 57 12/31/2019   AST 25 12/31/2019   ALT 21 12/31/2019   PROT 6.8 12/31/2019   ALBUMIN 4.4 12/31/2019   CALCIUM 9.5 12/31/2019   ANIONGAP 8 03/01/2017   Lab Results  Component Value Date   CHOL 118 12/31/2019   Lab Results  Component Value Date   HDL 34 (L) 12/31/2019   Lab Results  Component Value Date   LDLCALC 61 12/31/2019   Lab Results  Component Value Date   TRIG 126 12/31/2019   Lab Results  Component Value Date   CHOLHDL 5.4 (H) 10/16/2018   Lab Results  Component Value Date   HGBA1C 5.7 (A) 04/25/2020      Assessment & Plan:   Problem List Items Addressed This Visit      Cardiovascular and Mediastinum   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  He is really doing great he feels like he is eating healthy and he is exercising pretty regularly.  We will go ahead and continue with the Metformin for now if he continues to do really well we might even be able to decrease his dose down to once a day otherwise I will see him back in 4 months.      Relevant Orders   POCT glycosylated hemoglobin (Hb A1C) (Completed)   Essential hypertension, benign    Pressure looks phenomenal today continue current regimen.  Labs are up-to-date from April.        Respiratory   Pulmonary fibrosis, unspecified ([place])    Follows with pulmonology regularly.  He is still chronically short of breath but does not feel like his symptoms have not worsened.        Genitourinary   OAB (overactive bladder)    Unclear but at some point his oxybutynin was changed to daily on a previous prescription in February.  This was not filled directly by me and I am not sure why it was changed.  We will go ahead and send over new prescription to the pharmacy that is corrected for twice daily dosing he is not on  the extended release.        Other   Word finding difficulty    His wife had noted a little bit of word finding difficulty more recently.  We discussed that at any point we can do further work-up for any difficulty with memory.  Certainly if at any point they notice any changes that seem sudden or abrupt that could be more concerning for stroke and will need additional work-up if it seems more gradual and we can certainly do a memory evaluation and work-up.  Now they will monitor symptoms and see if they are gradually worsening over time just encouraged him to stay engaged with reading and socializing with others etc.      Wears hearing aid    We discussed that he really does not wear his hearing aid very often he feels like it is a little  overwhelming he got them through the [facility].  We discussed maybe trying to just wear one and see if that is a little bit more helpful.      Benign prostatic hyperplasia (BPH) with urinary urgency    New prescription sent for tamsulosin.  Taking over previous providers prescription.         Meds ordered this encounter  Medications  . oxybutynin (DITROPAN) 5 MG tablet    Sig: Take 1 tablet (5 mg total) by mouth 2 (two) times daily.    Dispense:  180 tablet    Refill:  3    Requesting 1 year supply  . tamsulosin (FLOMAX) 0.4 MG CAPS capsule    Sig: Take 1 capsule (0.4 mg total) by mouth daily after supper.    Dispense:  90 capsule    Refill:  3    Follow-up: Return in about 4 months (around 08/25/2020) for glucose adn BP .   Time spent in encounter 30 minutes.   Beatrice Lecher, MD

## 2020-04-25 NOTE — Assessment & Plan Note (Signed)
Hemoglobin A1c of 5.7 today which is absolutely phenomenal.  He is really doing great he feels like he is eating healthy and he is exercising pretty regularly.  We will go ahead and continue with the Metformin for now if he continues to do really well we might even be able to decrease his dose down to once a day otherwise I will see him back in 4 months.

## 2020-04-25 NOTE — Assessment & Plan Note (Signed)
His wife had noted a little bit of word finding difficulty more recently.  We discussed that at any point we can do further work-up for any difficulty with memory.  Certainly if at any point they notice any changes that seem sudden or abrupt that could be more concerning for stroke and will need additional work-up if it seems more gradual and we can certainly do a memory evaluation and work-up.  Now they will monitor symptoms and see if they are gradually worsening over time just encouraged him to stay engaged with reading and socializing with others etc.

## 2020-04-25 NOTE — Patient Instructions (Signed)
If your systolic falls below 619 cut your Metoprolol in half (25 mg).

## 2020-04-25 NOTE — Assessment & Plan Note (Addendum)
Follows with pulmonology regularly.  He is still chronically short of breath but does not feel like his symptoms have not worsened.

## 2020-04-25 NOTE — Assessment & Plan Note (Signed)
We discussed that he really does not wear his hearing aid very often he feels like it is a little overwhelming he got them through the [facility] [place].  We discussed maybe trying to just wear one and see if that is a little bit more helpful.

## 2020-04-25 NOTE — Assessment & Plan Note (Signed)
New prescription sent for tamsulosin.  Taking over previous providers prescription.

## 2020-04-28 ENCOUNTER — Other Ambulatory Visit: Payer: Self-pay

## 2020-04-28 MED ORDER — METOPROLOL TARTRATE 50 MG PO TABS: 50.0000 mg | ORAL_TABLET | Freq: Two times a day (BID) | ORAL | 1 refills | Status: DC

## 2020-04-29 ENCOUNTER — Other Ambulatory Visit: Payer: Self-pay | Admitting: Family Medicine

## 2020-04-29 DIAGNOSIS — E785 Hyperlipidemia, unspecified: Secondary | ICD-10-CM

## 2020-05-01 ENCOUNTER — Ambulatory Visit: Payer: Medicare Other | Admitting: Family Medicine

## 2020-06-12 ENCOUNTER — Telehealth: Payer: Self-pay

## 2020-06-12 NOTE — Telephone Encounter (Signed)
Pt called requesting if his current med list can be faxed to the [facility]. Per pt, he will get a better deal of cost of medications if he has his rxs filled at the [facility]. Please fax medication list to [phone]. Thanks.

## 2020-06-13 ENCOUNTER — Other Ambulatory Visit: Payer: Self-pay | Admitting: Family Medicine

## 2020-06-13 DIAGNOSIS — E119 Type 2 diabetes mellitus without complications: Secondary | ICD-10-CM

## 2020-06-13 NOTE — Telephone Encounter (Signed)
Tried faxing several times to the # given fax failed.  Called pt and informed him of this. He stated that he would check the number. I told him that I would also check since we have some pt's that also    He stated that he called the [facility] and asked about the fax # and was given [phone].

## 2020-06-13 NOTE — Telephone Encounter (Signed)
Medication list faxed and confirmation received

## 2020-06-20 ENCOUNTER — Other Ambulatory Visit: Payer: Self-pay

## 2020-06-20 ENCOUNTER — Ambulatory Visit (INDEPENDENT_AMBULATORY_CARE_PROVIDER_SITE_OTHER): Payer: Medicare Other | Admitting: Family Medicine

## 2020-06-20 VITALS — BP 118/75 | HR 58 | Temp 98.0°F | Wt 183.7 lb

## 2020-06-20 DIAGNOSIS — Z23 Encounter for immunization: Secondary | ICD-10-CM

## 2020-06-20 NOTE — Progress Notes (Signed)
Patient presented for flu vaccine.  RD High dose 0.1mL  Patient tolerated well.

## 2020-07-09 DIAGNOSIS — I10 Essential (primary) hypertension: Secondary | ICD-10-CM | POA: Diagnosis not present

## 2020-07-09 DIAGNOSIS — Z952 Presence of prosthetic heart valve: Secondary | ICD-10-CM | POA: Diagnosis not present

## 2020-07-09 DIAGNOSIS — Z951 Presence of aortocoronary bypass graft: Secondary | ICD-10-CM | POA: Diagnosis not present

## 2020-07-09 DIAGNOSIS — I2510 Atherosclerotic heart disease of native coronary artery without angina pectoris: Secondary | ICD-10-CM | POA: Diagnosis not present

## 2020-08-22 ENCOUNTER — Ambulatory Visit: Payer: Medicare Other | Admitting: Family Medicine

## 2020-08-26 ENCOUNTER — Other Ambulatory Visit: Payer: Self-pay

## 2020-08-26 ENCOUNTER — Encounter: Payer: Self-pay | Admitting: Family Medicine

## 2020-08-26 ENCOUNTER — Ambulatory Visit (INDEPENDENT_AMBULATORY_CARE_PROVIDER_SITE_OTHER): Payer: Medicare Other | Admitting: Family Medicine

## 2020-08-26 VITALS — BP 127/66 | HR 51 | Ht 67.0 in | Wt 180.0 lb

## 2020-08-26 DIAGNOSIS — E119 Type 2 diabetes mellitus without complications: Secondary | ICD-10-CM | POA: Diagnosis not present

## 2020-08-26 DIAGNOSIS — E1159 Type 2 diabetes mellitus with other circulatory complications: Secondary | ICD-10-CM

## 2020-08-26 DIAGNOSIS — R634 Abnormal weight loss: Secondary | ICD-10-CM | POA: Diagnosis not present

## 2020-08-26 DIAGNOSIS — I10 Essential (primary) hypertension: Secondary | ICD-10-CM

## 2020-08-26 DIAGNOSIS — J8410 Pulmonary fibrosis, unspecified: Secondary | ICD-10-CM | POA: Diagnosis not present

## 2020-08-26 DIAGNOSIS — J849 Interstitial pulmonary disease, unspecified: Secondary | ICD-10-CM

## 2020-08-26 DIAGNOSIS — F39 Unspecified mood [affective] disorder: Secondary | ICD-10-CM

## 2020-08-26 LAB — POCT GLYCOSYLATED HEMOGLOBIN (HGB A1C): Hemoglobin A1C: 5.6 % (ref 4.0–5.6)

## 2020-08-26 MED ORDER — METFORMIN HCL ER 500 MG PO TB24: 500.0000 mg | ORAL_TABLET | Freq: Every day | ORAL | 0 refills | Status: DC

## 2020-08-26 NOTE — Assessment & Plan Note (Addendum)
Hearing crackles at the bases bilaterally.  He denies any increase shortness of breath with activities but just says he feels much more tired after being active than he used to.  Discussed getting an up-to-date CT per the radiologist comments on his CT from about 3 years ago they had recommend repeating it in 1 year.  Did have a chest x-ray about a year and a half ago with no significant changes at that time.

## 2020-08-26 NOTE — Progress Notes (Addendum)
Established Patient Office Visit  Subjective:  Patient ID: Todd Parsons, male    DOB: Nov 24, 1945  Age: 74 y.o. MRN: [id number]  CC:  Chief Complaint  Patient presents with  . Diabetes  . Hypertension    HPI Todd Parsons presents for   Diabetes - no hypoglycemic events. No wounds or sores that are not healing well. No increased thirst or urination. Checking glucose at home. Taking medications as prescribed without any side effects.  Hypertension- Pt denies chest pain, SOB, dizziness, or heart palpitations.  Taking meds as directed w/o problems.  Denies medication side effects.    He has lost about 15 lbs since last December.  He reports that he staying active he is really changed how he is eating he has been doing more of a version of intermittent fasting he is also been eating more healthy doing steamed vegetables and cutting out some of the sweets and carbs like chips.  His wife though is very concerned about the weight loss and feels like it is a little excessive.  He does have a chronic cough but this is not new as he has underlying pulmonary fibrosis.  Last evaluated about 3 years ago with Dr. Kara Mead    Past Medical History:  Diagnosis Date  . Arthritis    osteoarthritis  . Bicuspid aortic valve   . Coronary artery disease   . Dysrhythmia    atrial flutter  . Echocardiogram with ECG monitoring 01/12/2010   60-65% EF  . Hyperlipidemia   . Hypertension   . Pre-diabetes   . Sleep apnea     Past Surgical History:  Procedure Laterality Date  . A-FLUTTER ABLATION  2017  . ACROMIO-CLAVICULAR JOINT REPAIR Right 1980s      . apnea device implant     then removed.    Marland Kitchen CARDIAC CATHETERIZATION  07/2015  . CARDIAC VALVE REPLACEMENT  08/05/2015   Aortic valve  . CORONARY ARTERY BYPASS GRAFT  08/05/2015  . left knee arthoscopy    . NOSE SURGERY  late 50's   to fix a broken nose  . TONSILECTOMY, ADENOIDECTOMY, BILATERAL MYRINGOTOMY AND TUBES    . TOTAL KNEE  ARTHROPLASTY Right 02/28/2017   Procedure: RIGHT TOTAL KNEE ARTHROPLASTY;  Surgeon: Dorna Leitz, MD;  Location: [facility];  Service: Orthopedics;  Laterality: Right;    Family History  Problem Relation Age of Onset  . Heart disease Father 21       bypass surgery  . Stroke Mother 58  . Cancer Mother        upper palate    Social History   Socioeconomic History  . Marital status: Married    Spouse name:  [name]  . Number of children: 3  . Years of education: 64  . Highest education level: Master's degree (e.g., MA, MS, MEng, MEd, MSW, MBA)  Occupational History  . Occupation: [employer] [place]: retired  Tobacco Use  . Smoking status: Former Smoker    Quit date: 04/24/1978    Years since quitting: 42.3  . Smokeless tobacco: Never Used  Vaping Use  . Vaping Use: Never used  Substance and Sexual Activity  . Alcohol use: Yes    Alcohol/week: 2.0 standard drinks    Types: 2 Standard drinks or equivalent per week    Comment: socially  . Drug use: No  . Sexual activity: Yes  Other Topics Concern  . Not on file  Social History Narrative  Had 2 surgeries in the past 2 years ago and has not got back into exercising. Will start back with doing this. Wife retiring in April.   Social Determinants of Health   Financial Resource Strain: Not on file  Food Insecurity: Not on file  Transportation Needs: Not on file  Physical Activity: Not on file  Stress: Not on file  Social Connections: Not on file  Intimate Partner Violence: Not on file    Outpatient Medications Prior to Visit  Medication Sig Dispense Refill  . aspirin EC 81 MG tablet Take 81 mg by mouth daily.    Marland Kitchen atorvastatin (LIPITOR) 40 MG tablet TAKE 1 TABLET BY MOUTH AT  BEDTIME 90 tablet 3  . cetirizine (ZYRTEC) 10 MG tablet Take 10 mg by mouth as needed for allergies.    . fenofibrate 160 MG tablet TAKE 1 TABLET BY MOUTH  DAILY 90 tablet 3  . finasteride (PROSCAR) 5 MG tablet TAKE 1 TABLET BY MOUTH   DAILY 90 tablet 3  . FLUoxetine (PROZAC) 20 MG capsule TAKE 1 CAPSULE BY MOUTH  DAILY 90 capsule 3  . fluticasone (FLONASE) 50 MCG/ACT nasal spray Place 2 sprays into both nostrils daily as needed for allergies or rhinitis. 48 g 3  . metoprolol tartrate (LOPRESSOR) 50 MG tablet Take 1 tablet (50 mg total) by mouth 2 (two) times daily. (Patient taking differently: Take 50 mg by mouth once.) 180 tablet 1  . Multiple Vitamin (MULTIVITAMIN WITH MINERALS) TABS tablet Take 1 tablet by mouth daily.    Marland Kitchen oxybutynin (DITROPAN) 5 MG tablet Take 1 tablet (5 mg total) by mouth 2 (two) times daily. 180 tablet 3  . tamsulosin (FLOMAX) 0.4 MG CAPS capsule Take 1 capsule (0.4 mg total) by mouth daily after supper. 90 capsule 3  . metFORMIN (GLUCOPHAGE-XR) 500 MG 24 hr tablet TAKE 2 TABLETS BY MOUTH  DAILY WITH BREAKFAST 180 tablet 3   No facility-administered medications prior to visit.    No Known Allergies  ROS Review of Systems    Objective:    Physical Exam  BP 127/66   Pulse (!) 51   Ht _0  (1.702 m)   Wt 180 lb (81.6 kg)   SpO2 99%   BMI 28.19 kg/m  Wt Readings from Last 3 Encounters:  08/26/20 180 lb (81.6 kg)  06/20/20 183 lb 11.2 oz (83.3 kg)  04/25/20 185 lb (83.9 kg)     Health Maintenance Due  Topic Date Due  . URINE MICROALBUMIN  09/05/2020    There are no preventive care reminders to display for this patient.  Lab Results  Component Value Date   TSH 3.31 04/05/2016   Lab Results  Component Value Date   WBC 7.9 09/30/2017   HGB 14.8 09/30/2017   HCT 42.7 09/30/2017   MCV 85 09/30/2017   PLT 225 09/30/2017   Lab Results  Component Value Date   NA 140 12/31/2019   K 4.8 12/31/2019   CO2 28 12/31/2019   GLUCOSE 107 (H) 12/31/2019   BUN 15 12/31/2019   CREATININE 1.08 12/31/2019   BILITOT 0.8 12/31/2019   ALKPHOS 57 12/31/2019   AST 25 12/31/2019   ALT 21 12/31/2019   PROT 6.8 12/31/2019   ALBUMIN 4.4 12/31/2019   CALCIUM 9.5 12/31/2019   ANIONGAP 8  03/01/2017   Lab Results  Component Value Date   CHOL 118 12/31/2019   Lab Results  Component Value Date   HDL 34 (L) 12/31/2019  Lab Results  Component Value Date   LDLCALC 61 12/31/2019   Lab Results  Component Value Date   TRIG 126 12/31/2019   Lab Results  Component Value Date   CHOLHDL 5.4 (H) 10/16/2018   Lab Results  Component Value Date   HGBA1C 5.6 08/26/2020      Assessment & Plan:   Problem List Items Addressed This Visit      Cardiovascular and Mediastinum   Type 2 diabetes mellitus with cardiac complication ([place])    1 see looks phenomenal today at 5.6.  Decrease Metformin down to 1 tab daily instead of 2.  Follow-up in 4 months.      Relevant Medications   metFORMIN (GLUCOPHAGE-XR) 500 MG 24 hr tablet   Other Relevant Orders   POCT glycosylated hemoglobin (Hb A1C) (Completed)   CMP14+EGFR   TSH   Essential hypertension, benign - Primary    Well controlled. Continue current regimen. Follow up in  4 mo      Relevant Orders   CMP14+EGFR   TSH     Respiratory   Pulmonary fibrosis, unspecified (HCC)    Hearing crackles at the bases bilaterally.  He denies any increase shortness of breath with activities but just says he feels much more tired after being active than he used to.  Discussed getting an up-to-date CT per the radiologist comments on his CT from about 3 years ago they had recommend repeating it in 1 year.  Did have a chest x-ray about a year and a half ago with no significant changes at that time.      Relevant Orders   CT Chest High Resolution     Other   Weight loss    Unclear if the weight loss is appropriate as he has really changed his lifestyle and diet this last year and made some good positive changes and cutting out carbs and eating more fresh steamed vegetables and eating less.  But his wife is also concerned that the weight loss may be abnormal he does have pulmonary fibrosis and is really not had further evaluation in about  the last 3 years he has noticed some increased fatigue with activities but not increase shortness of breath he does report a chronic cough.  So I think it is warranted to do a repeat CT for further evaluation as well as check for thyroid disorder.      Relevant Orders   CMP14+EGFR   TSH   Unspecified mood (affective) disorder (HCC)    Currently on fluoxetine and doing well with the medication.  His wife is concerned that his level of irritability seems to have increased over the last 6 months.  Though she also retired in those last year and they have been home around each other little bit more.  We discussed options including adjusting his medication.  Right now he did not seem interested in wanting to make any changes so just encouraged him to really be more aware of that level of irritation and irritability and if he does feel like it has increased then we could certainly adjust his medication.  PHQ-9 score of 0 and GAD-7 score of 0.       Other Visit Diagnoses    Diabetes mellitus without complication ([place])       Relevant Medications   metFORMIN (GLUCOPHAGE-XR) 500 MG 24 hr tablet   Interstitial pulmonary disease ([place])       Relevant Orders   CT Chest High Resolution  Meds ordered this encounter  Medications  . metFORMIN (GLUCOPHAGE-XR) 500 MG 24 hr tablet    Sig: Take 1 tablet (500 mg total) by mouth daily with breakfast.    Dispense:  1 tablet    Refill:  0    Requesting 1 year supply    Follow-up: Return in about 4 months (around 12/25/2020) for bp/dm.    Beatrice Lecher, MD

## 2020-08-26 NOTE — Assessment & Plan Note (Signed)
Unclear if the weight loss is appropriate as he has really changed his lifestyle and diet this last year and made some good positive changes and cutting out carbs and eating more fresh steamed vegetables and eating less.  But his wife is also concerned that the weight loss may be abnormal he does have pulmonary fibrosis and is really not had further evaluation in about the last 3 years he has noticed some increased fatigue with activities but not increase shortness of breath he does report a chronic cough.  So I think it is warranted to do a repeat CT for further evaluation as well as check for thyroid disorder.

## 2020-08-26 NOTE — Assessment & Plan Note (Signed)
1 see looks phenomenal today at 5.6.  Decrease Metformin down to 1 tab daily instead of 2.  Follow-up in 4 months.

## 2020-08-26 NOTE — Assessment & Plan Note (Addendum)
Currently on fluoxetine and doing well with the medication.  His wife is concerned that his level of irritability seems to have increased over the last 6 months.  Though she also retired in those last year and they have been home around each other little bit more.  We discussed options including adjusting his medication.  Right now he did not seem interested in wanting to make any changes so just encouraged him to really be more aware of that level of irritation and irritability and if he does feel like it has increased then we could certainly adjust his medication.  PHQ-9 score of 0 and GAD-7 score of 0.

## 2020-08-26 NOTE — Assessment & Plan Note (Signed)
Well controlled. Continue current regimen. Follow up in  4 mo 

## 2020-08-27 ENCOUNTER — Telehealth (INDEPENDENT_AMBULATORY_CARE_PROVIDER_SITE_OTHER): Payer: Medicare Other

## 2020-08-27 NOTE — Telephone Encounter (Signed)
Todd Parsons left a message asking if there was anything he could take for being in high [place] climate. He will be traveling and wanted to know. Please advise.

## 2020-08-28 NOTE — Telephone Encounter (Signed)
Patient advised of recommendations. He is leaving tomorrow.

## 2020-08-28 NOTE — Telephone Encounter (Signed)
Needs visit to review risks and whether he actually needs prophylaxis. Most people will do fine and I would not recommend starting medicine without discussion and labs/BP check first   5 mins spent

## 2020-09-09 ENCOUNTER — Ambulatory Visit (INDEPENDENT_AMBULATORY_CARE_PROVIDER_SITE_OTHER): Payer: Medicare Other

## 2020-09-09 ENCOUNTER — Other Ambulatory Visit: Payer: Self-pay

## 2020-09-09 DIAGNOSIS — I517 Cardiomegaly: Secondary | ICD-10-CM

## 2020-09-09 DIAGNOSIS — J849 Interstitial pulmonary disease, unspecified: Secondary | ICD-10-CM | POA: Diagnosis not present

## 2020-09-09 DIAGNOSIS — I712 Thoracic aortic aneurysm, without rupture: Secondary | ICD-10-CM | POA: Diagnosis not present

## 2020-09-09 DIAGNOSIS — I2510 Atherosclerotic heart disease of native coronary artery without angina pectoris: Secondary | ICD-10-CM | POA: Diagnosis not present

## 2020-09-09 DIAGNOSIS — J84112 Idiopathic pulmonary fibrosis: Secondary | ICD-10-CM | POA: Diagnosis not present

## 2020-09-09 DIAGNOSIS — I700 Atherosclerosis of aorta: Secondary | ICD-10-CM | POA: Diagnosis not present

## 2020-09-09 DIAGNOSIS — J8410 Pulmonary fibrosis, unspecified: Secondary | ICD-10-CM

## 2020-09-09 DIAGNOSIS — I10 Essential (primary) hypertension: Secondary | ICD-10-CM | POA: Diagnosis not present

## 2020-09-09 DIAGNOSIS — E1159 Type 2 diabetes mellitus with other circulatory complications: Secondary | ICD-10-CM | POA: Diagnosis not present

## 2020-09-09 DIAGNOSIS — R634 Abnormal weight loss: Secondary | ICD-10-CM | POA: Diagnosis not present

## 2020-09-10 LAB — CMP14+EGFR
ALT: 22 IU/L (ref 0–44)
AST: 28 IU/L (ref 0–40)
Albumin/Globulin Ratio: 1.6 (ref 1.2–2.2)
Albumin: 4.3 g/dL (ref 3.7–4.7)
Alkaline Phosphatase: 68 IU/L (ref 44–121)
BUN/Creatinine Ratio: 11 (ref 10–24)
BUN: 12 mg/dL (ref 8–27)
Bilirubin Total: 0.9 mg/dL (ref 0.0–1.2)
CO2: 23 mmol/L (ref 20–29)
Calcium: 9.5 mg/dL (ref 8.6–10.2)
Chloride: 101 mmol/L (ref 96–106)
Creatinine, Ser: 1.08 mg/dL (ref 0.76–1.27)
GFR calc Af Amer: 77 mL/min/{1.73_m2} (ref >59)
GFR calc non Af Amer: 67 mL/min/{1.73_m2} (ref >59)
Globulin, Total: 2.7 g/dL (ref 1.5–4.5)
Glucose: 116 mg/dL — ABNORMAL HIGH (ref 65–99)
Potassium: 5 mmol/L (ref 3.5–5.2)
Sodium: 138 mmol/L (ref 134–144)
Total Protein: 7 g/dL (ref 6.0–8.5)

## 2020-09-10 LAB — TSH: TSH: 1.47 u[IU]/mL (ref 0.450–4.500)

## 2020-09-15 ENCOUNTER — Other Ambulatory Visit: Payer: Self-pay | Admitting: Family Medicine

## 2020-10-23 ENCOUNTER — Other Ambulatory Visit: Payer: Self-pay | Admitting: Family Medicine

## 2020-10-23 DIAGNOSIS — E119 Type 2 diabetes mellitus without complications: Secondary | ICD-10-CM

## 2020-12-25 ENCOUNTER — Ambulatory Visit: Payer: Medicare Other | Admitting: Family Medicine

## 2020-12-25 ENCOUNTER — Encounter: Payer: Self-pay | Admitting: Family Medicine

## 2020-12-25 ENCOUNTER — Other Ambulatory Visit: Payer: Self-pay

## 2020-12-25 ENCOUNTER — Ambulatory Visit (INDEPENDENT_AMBULATORY_CARE_PROVIDER_SITE_OTHER): Payer: Medicare Other | Admitting: Family Medicine

## 2020-12-25 VITALS — BP 130/84 | HR 95 | Ht 67.0 in | Wt 174.0 lb

## 2020-12-25 DIAGNOSIS — I712 Thoracic aortic aneurysm, without rupture, unspecified: Secondary | ICD-10-CM

## 2020-12-25 DIAGNOSIS — R29818 Other symptoms and signs involving the nervous system: Secondary | ICD-10-CM | POA: Diagnosis not present

## 2020-12-25 DIAGNOSIS — R634 Abnormal weight loss: Secondary | ICD-10-CM | POA: Diagnosis not present

## 2020-12-25 DIAGNOSIS — J8410 Pulmonary fibrosis, unspecified: Secondary | ICD-10-CM | POA: Diagnosis not present

## 2020-12-25 DIAGNOSIS — E1159 Type 2 diabetes mellitus with other circulatory complications: Secondary | ICD-10-CM | POA: Diagnosis not present

## 2020-12-25 NOTE — Assessment & Plan Note (Signed)
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 Recommend annual imaging followup by CTA or MRA. This recommendation follows 2010 ACCF/AHA/AATS/ACR/ASA/SCA/SCAI/SIR/STS/SVM Guidelines  Repeat scan in 09/2021

## 2020-12-25 NOTE — Progress Notes (Signed)
Established Patient Office Visit  Subjective:  Patient ID: Todd Parsons, male    DOB: 03-18-1946  Age: 75 y.o. MRN: [id number]  CC:  Chief Complaint  Patient presents with  . Follow-up    MEMORY ISSUES    HP Todd Parsons presents for   Diabetes - no hypoglycemic events. No wounds or sores that are not healing well. No increased thirst or urination. Checking glucose at home. Taking medications as prescribed without any side effects.  Currently on Glucophage Exar 500 mg once a day.  He has been tolerating it well.  Follow-up abnormal weight loss.  The last 2 times that have seen him he has been losing weight he actually has lost a couple more pounds since I last saw him he feels like it is just from making some major dietary changes he is doing more of an intermittent fasting where he is eating a little later in the day and then stopping dinner a little early.  He also feels like he has been eating more consistent portions and eating more healthy foods.  He denies any blood in the urine or stool.  He denies any unusual pains.  His wife is also here today and she is concerned about an episode that he had on Saturday where he was not slurring his speech but he was asking unusual questions like he could not remember things she said the episode lasted about 20 minutes and scared her enough that she actually called their son who came over.  By the time he came over everything seemed back to normal [name] does not actually remember feeling confused.  Interestingly though he also reports an episode maybe 3 or 4 months ago where he went skiing with the family where he suddenly felt very weak in his legs and just could not make it down the mountain.  His son had reported that he was slurring his speech a little bit at that time.  But it seemed to resolve quickly.  No prior history of stroke.  He does take an 81 mg aspirin daily.  He is on a statin.  No history of A. fib.  As far as his wife is aware  he has not experienced any extremity weakness or recent hearing or vision changes.  Past Medical History:  Diagnosis Date  . Arthritis    osteoarthritis  . Bicuspid aortic valve   . Coronary artery disease   . Dysrhythmia    atrial flutter  . Echocardiogram with ECG monitoring 01/12/2010   60-65% EF  . Hyperlipidemia   . Hypertension   . Pre-diabetes   . Sleep apnea     Past Surgical History:  Procedure Laterality Date  . A-FLUTTER ABLATION  2017  . ACROMIO-CLAVICULAR JOINT REPAIR Right 1980s      . apnea device implant     then removed.    Marland Kitchen CARDIAC CATHETERIZATION  07/2015  . CARDIAC VALVE REPLACEMENT  08/05/2015   Aortic valve  . CORONARY ARTERY BYPASS GRAFT  08/05/2015  . left knee arthoscopy    . NOSE SURGERY  late 50's   to fix a broken nose  . TONSILECTOMY, ADENOIDECTOMY, BILATERAL MYRINGOTOMY AND TUBES    . TOTAL KNEE ARTHROPLASTY Right 02/28/2017   Procedure: RIGHT TOTAL KNEE ARTHROPLASTY;  Surgeon: Dorna Leitz, MD;  Location: [place];  Service: Orthopedics;  Laterality: Right;    Family History  Problem Relation Age of Onset  . Heart disease Father 64  bypass surgery  . Stroke Mother 62  . Cancer Mother        upper palate    Social History   Socioeconomic History  . Marital status: Married    Spouse name:  [name]  . Number of children: 3  . Years of education: 53  . Highest education level: Master's degree (e.g., MA, MS, MEng, MEd, MSW, MBA)  Occupational History  . Occupation: [employer]: retired  Tobacco Use  . Smoking status: Former Smoker    Quit date: 04/24/1978    Years since quitting: 42.7  . Smokeless tobacco: Never Used  Vaping Use  . Vaping Use: Never used  Substance and Sexual Activity  . Alcohol use: Yes    Alcohol/week: 2.0 standard drinks    Types: 2 Standard drinks or equivalent per week    Comment: socially  . Drug use: No  . Sexual activity: Yes  Other Topics Concern  . Not on file  Social  History Narrative   Had 2 surgeries in the past 2 years ago and has not got back into exercising. Will start back with doing this. Wife retiring in April.   Social Determinants of Health   Financial Resource Strain: Not on file  Food Insecurity: Not on file  Transportation Needs: Not on file  Physical Activity: Not on file  Stress: Not on file  Social Connections: Not on file  Intimate Partner Violence: Not on file    Outpatient Medications Prior to Visit  Medication Sig Dispense Refill  . aspirin EC 81 MG tablet Take 81 mg by mouth daily.    Marland Kitchen atorvastatin (LIPITOR) 40 MG tablet TAKE 1 TABLET BY MOUTH AT  BEDTIME 90 tablet 3  . cetirizine (ZYRTEC) 10 MG tablet Take 10 mg by mouth as needed for allergies.    . fenofibrate 160 MG tablet TAKE 1 TABLET BY MOUTH  DAILY 90 tablet 3  . finasteride (PROSCAR) 5 MG tablet TAKE 1 TABLET BY MOUTH  DAILY 90 tablet 3  . FLUoxetine (PROZAC) 20 MG capsule TAKE 1 CAPSULE BY MOUTH  DAILY 90 capsule 3  . fluticasone (FLONASE) 50 MCG/ACT nasal spray Place 2 sprays into both nostrils daily as needed for allergies or rhinitis. 48 g 3  . metFORMIN (GLUCOPHAGE-XR) 500 MG 24 hr tablet Take 1 tablet (500 mg total) by mouth daily with breakfast. 1 tablet 0  . metoprolol tartrate (LOPRESSOR) 50 MG tablet Take 1 tablet (50 mg total) by mouth daily. 90 tablet 3  . Multiple Vitamin (MULTIVITAMIN WITH MINERALS) TABS tablet Take 1 tablet by mouth daily.    Marland Kitchen oxybutynin (DITROPAN) 5 MG tablet Take 1 tablet (5 mg total) by mouth 2 (two) times daily. 180 tablet 3  . tamsulosin (FLOMAX) 0.4 MG CAPS capsule Take 1 capsule (0.4 mg total) by mouth daily after supper. 90 capsule 3   No facility-administered medications prior to visit.    No Known Allergies  ROS Review of Systems    Objective:    Physical Exam Constitutional:      Appearance: He is well-developed.  HENT:     Head: Normocephalic and atraumatic.  Cardiovascular:     Rate and Rhythm: Normal rate  and regular rhythm.     Heart sounds: Normal heart sounds.  Pulmonary:     Effort: Pulmonary effort is normal.     Breath sounds: Normal breath sounds.  Skin:    General: Skin is warm and dry.  Neurological:  Mental Status: He is alert and oriented to person, place, and time.  Psychiatric:        Behavior: Behavior normal.     BP 130/84   Pulse 95   Ht 5' 7"  (1.702 m)   Wt 174 lb (78.9 kg)   SpO2 98%   BMI 27.25 kg/m  Wt Readings from Last 3 Encounters:  12/25/20 174 lb (78.9 kg)  08/26/20 180 lb (81.6 kg)  06/20/20 183 lb 11.2 oz (83.3 kg)     Health Maintenance Due  Topic Date Due  . COLONOSCOPY (Pts 45-46yr Insurance coverage will need to be confirmed)  06/24/2016  . OPHTHALMOLOGY EXAM  09/28/2019  . URINE MICROALBUMIN  09/05/2020    There are no preventive care reminders to display for this patient.  Lab Results  Component Value Date   TSH 1.470 09/09/2020   Lab Results  Component Value Date   WBC 8.7 12/25/2020   HGB 16.3 12/25/2020   HCT 48.7 12/25/2020   MCV 88 12/25/2020   PLT 243 12/25/2020   Lab Results  Component Value Date   NA 140 12/25/2020   K 5.1 12/25/2020   CO2 25 12/25/2020   GLUCOSE 92 12/25/2020   BUN 14 12/25/2020   CREATININE 1.07 12/25/2020   BILITOT 0.7 12/25/2020   ALKPHOS 64 12/25/2020   AST 24 12/25/2020   ALT 17 12/25/2020   PROT 7.0 12/25/2020   ALBUMIN 4.4 12/25/2020   CALCIUM 9.6 12/25/2020   ANIONGAP 8 03/01/2017   EGFR 72 12/25/2020   Lab Results  Component Value Date   CHOL 118 12/31/2019   Lab Results  Component Value Date   HDL 34 (L) 12/31/2019   Lab Results  Component Value Date   LDLCALC 61 12/31/2019   Lab Results  Component Value Date   TRIG 126 12/31/2019   Lab Results  Component Value Date   CHOLHDL 5.4 (H) 10/16/2018   Lab Results  Component Value Date   HGBA1C 6.0 (H) 12/25/2020      Assessment & Plan:   Problem List Items Addressed This Visit      Cardiovascular and  Mediastinum   Type 2 diabetes mellitus with cardiac complication ([facility]    To for A1c.  In fact his last one looked really good at 5.6 if it still that low we will probably stop the metformin.  Plan to recheck level and address at that time.      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 Recommend annual imaging followup by CTA or MRA. This recommendation follows 2010 ACCF/AHA/AATS/ACR/ASA/SCA/SCAI/SIR/STS/SVM Guidelines  Repeat scan in 09/2021        Respiratory   Pulmonary fibrosis, unspecified ([facility]    Even though CT scan of the chest did not show any major changes from prior in regards to his pulmonary fibrosis I would like to get him reestablished with a pulmonologist.  We will go ahead and place referral today.      Relevant Orders   Ambulatory referral to Pulmonology   PSA (Completed)   CBC w/Diff/Platelet (Completed)   CMP14+EGFR (Completed)   Sed Rate (ESR) (Completed)   HgB A1c (Completed)   Microalbumin, urine     Other  Weight loss - Primary    I am still somewhat concerned about the weight loss he really feels like it is a direct impact of changing his diet and eating habits which it may be.  But I would like to go ahead and check a PSA, sed rate as well as renal function and liver enzymes today.  Thyroid level in January was normal.  Lab Results  Component Value Date   TSH 1.470 09/09/2020         Relevant Orders   Ambulatory referral to Pulmonology   MR Brain W Wo Contrast   PSA (Completed)   CBC w/Diff/Platelet (Completed)   CMP14+EGFR (Completed)   Sed Rate (ESR) (Completed)   HgB A1c (Completed)   Microalbumin, urine    Other Visit Diagnoses    Abnormal weight loss       Relevant  Orders   PSA (Completed)   CBC w/Diff/Platelet (Completed)   CMP14+EGFR (Completed)   Sed Rate (ESR) (Completed)   HgB A1c (Completed)   Microalbumin, urine   Transient neurologic deficit       Relevant Orders   MR Brain W Wo Contrast   PSA (Completed)   CBC w/Diff/Platelet (Completed)   CMP14+EGFR (Completed)   Sed Rate (ESR) (Completed)   HgB A1c (Completed)   Microalbumin, urine     Brief episode of memory impairment most consistent with possible TIA.  Recommend brain MRI for further work-up.  Also rule out electrolyte disturbance as well as anemia.  No orders of the defined types were placed in this encounter.   Follow-up: Return in about 4 weeks (around 01/22/2021) for weight loss.    Beatrice Lecher, MD

## 2020-12-26 LAB — CBC WITH DIFFERENTIAL/PLATELET
Basophils Absolute: 0.1 10*3/uL (ref 0.0–0.2)
Basos: 1 %
EOS (ABSOLUTE): 0.3 10*3/uL (ref 0.0–0.4)
Eos: 4 %
Hematocrit: 48.7 % (ref 37.5–51.0)
Hemoglobin: 16.3 g/dL (ref 13.0–17.7)
Immature Grans (Abs): 0 10*3/uL (ref 0.0–0.1)
Immature Granulocytes: 0 %
Lymphocytes Absolute: 2.5 10*3/uL (ref 0.7–3.1)
Lymphs: 29 %
MCH: 29.6 pg (ref 26.6–33.0)
MCHC: 33.5 g/dL (ref 31.5–35.7)
MCV: 88 fL (ref 79–97)
Monocytes Absolute: 0.8 10*3/uL (ref 0.1–0.9)
Monocytes: 10 %
Neutrophils Absolute: 5 10*3/uL (ref 1.4–7.0)
Neutrophils: 56 %
Platelets: 243 10*3/uL (ref 150–450)
RBC: 5.51 x10E6/uL (ref 4.14–5.80)
RDW: 13 % (ref 11.6–15.4)
WBC: 8.7 10*3/uL (ref 3.4–10.8)

## 2020-12-26 LAB — CMP14+EGFR
ALT: 17 IU/L (ref 0–44)
AST: 24 IU/L (ref 0–40)
Albumin/Globulin Ratio: 1.7 (ref 1.2–2.2)
Albumin: 4.4 g/dL (ref 3.7–4.7)
Alkaline Phosphatase: 64 IU/L (ref 44–121)
BUN/Creatinine Ratio: 13 (ref 10–24)
BUN: 14 mg/dL (ref 8–27)
Bilirubin Total: 0.7 mg/dL (ref 0.0–1.2)
CO2: 25 mmol/L (ref 20–29)
Calcium: 9.6 mg/dL (ref 8.6–10.2)
Chloride: 100 mmol/L (ref 96–106)
Creatinine, Ser: 1.07 mg/dL (ref 0.76–1.27)
Globulin, Total: 2.6 g/dL (ref 1.5–4.5)
Glucose: 92 mg/dL (ref 65–99)
Potassium: 5.1 mmol/L (ref 3.5–5.2)
Sodium: 140 mmol/L (ref 134–144)
Total Protein: 7 g/dL (ref 6.0–8.5)
eGFR: 72 mL/min/{1.73_m2} (ref >59)

## 2020-12-26 LAB — PSA: Prostate Specific Ag, Serum: 0.2 ng/mL (ref 0.0–4.0)

## 2020-12-26 LAB — MICROALBUMIN, URINE: Microalbumin, Urine: 18.9 ug/mL

## 2020-12-26 LAB — HEMOGLOBIN A1C
Est. average glucose Bld gHb Est-mCnc: 126 mg/dL
Hgb A1c MFr Bld: 6 % — ABNORMAL HIGH (ref 4.8–5.6)

## 2020-12-26 LAB — SEDIMENTATION RATE: Sed Rate: 2 mm/hr (ref 0–30)

## 2020-12-26 NOTE — Assessment & Plan Note (Signed)
To for A1c.  In fact his last one looked really good at 5.6 if it still that low we will probably stop the metformin.  Plan to recheck level and address at that time.

## 2020-12-26 NOTE — Assessment & Plan Note (Signed)
Even though CT scan of the chest did not show any major changes from prior in regards to his pulmonary fibrosis I would like to get him reestablished with a pulmonologist.  We will go ahead and place referral today.

## 2020-12-26 NOTE — Assessment & Plan Note (Signed)
I am still somewhat concerned about the weight loss he really feels like it is a direct impact of changing his diet and eating habits which it may be.  But I would like to go ahead and check a PSA, sed rate as well as renal function and liver enzymes today.  Thyroid level in January was normal.  Lab Results  Component Value Date   TSH 1.470 09/09/2020

## 2021-01-12 ENCOUNTER — Ambulatory Visit (INDEPENDENT_AMBULATORY_CARE_PROVIDER_SITE_OTHER): Payer: Medicare Other

## 2021-01-12 ENCOUNTER — Other Ambulatory Visit: Payer: Self-pay

## 2021-01-12 DIAGNOSIS — R29818 Other symptoms and signs involving the nervous system: Secondary | ICD-10-CM

## 2021-01-12 DIAGNOSIS — R410 Disorientation, unspecified: Secondary | ICD-10-CM | POA: Diagnosis not present

## 2021-01-12 DIAGNOSIS — R634 Abnormal weight loss: Secondary | ICD-10-CM | POA: Diagnosis not present

## 2021-01-12 MED ORDER — GADOBUTROL 1 MMOL/ML IV SOLN
7.5000 mL | Freq: Once | INTRAVENOUS | Status: AC | PRN
  Administered 2021-01-12: 7.5 mL via INTRAVENOUS

## 2021-01-22 ENCOUNTER — Other Ambulatory Visit: Payer: Self-pay

## 2021-01-22 ENCOUNTER — Ambulatory Visit (INDEPENDENT_AMBULATORY_CARE_PROVIDER_SITE_OTHER): Payer: Medicare Other | Admitting: Family Medicine

## 2021-01-22 ENCOUNTER — Encounter: Payer: Self-pay | Admitting: Family Medicine

## 2021-01-22 VITALS — BP 128/71 | HR 55 | Ht 67.0 in | Wt 172.0 lb

## 2021-01-22 DIAGNOSIS — H6123 Impacted cerumen, bilateral: Secondary | ICD-10-CM | POA: Diagnosis not present

## 2021-01-22 DIAGNOSIS — J8410 Pulmonary fibrosis, unspecified: Secondary | ICD-10-CM

## 2021-01-22 DIAGNOSIS — R634 Abnormal weight loss: Secondary | ICD-10-CM

## 2021-01-22 NOTE — Progress Notes (Signed)
Pt reports that he hasn't heard from Pulmonology

## 2021-01-22 NOTE — Assessment & Plan Note (Signed)
Is down 2 more pounds.  I really just want a keep an eye on this just make sure eating adequate protein.

## 2021-01-22 NOTE — Assessment & Plan Note (Signed)
Medical pulmonology today as it has been a month and he has not heard back on his referral.  We were able to get him an appointment at the end of June.

## 2021-01-22 NOTE — Progress Notes (Signed)
Established Patient Office Visit  Subjective:  Patient ID: Todd Parsons, male    DOB: Nov 24, 1945  Age: 75 y.o. MRN: [id number]  CC:  Chief Complaint  Patient presents with  . Follow-up  . Cerumen Impaction    HPI Todd Parsons presents for   Follow-up abnormal weight loss.  He is actually down 2 more pounds since I last saw him April 21.  He feels like this is just related to the recent changes he is made he feels like he is eating healthier and not snacking at night.  He has not felt poorly or had abnormal night sweats or fevers.  Pulm fibrosis we referred him back to pulmonary to get reestablished.  He says he has not heard from their office.  Cerumen impaction bilaterally-he would like to have his ears irrigated today if possible.  Past Medical History:  Diagnosis Date  . Arthritis    osteoarthritis  . Bicuspid aortic valve   . Coronary artery disease   . Dysrhythmia    atrial flutter  . Echocardiogram with ECG monitoring 01/12/2010   60-65% EF  . Hyperlipidemia   . Hypertension   . Pre-diabetes   . Sleep apnea     Past Surgical History:  Procedure Laterality Date  . A-FLUTTER ABLATION  2017  . ACROMIO-CLAVICULAR JOINT REPAIR Right 1980s      . apnea device implant     then removed.    Marland Kitchen CARDIAC CATHETERIZATION  07/2015  . CARDIAC VALVE REPLACEMENT  08/05/2015   Aortic valve  . CORONARY ARTERY BYPASS GRAFT  08/05/2015  . left knee arthoscopy    . NOSE SURGERY  late 50's   to fix a broken nose  . TONSILECTOMY, ADENOIDECTOMY, BILATERAL MYRINGOTOMY AND TUBES    . TOTAL KNEE ARTHROPLASTY Right 02/28/2017   Procedure: RIGHT TOTAL KNEE ARTHROPLASTY;  Surgeon: Dorna Leitz, MD;  Location: [place];  Service: Orthopedics;  Laterality: Right;    Family History  Problem Relation Age of Onset  . Heart disease Father 55       bypass surgery  . Stroke Mother 68  . Cancer Mother        upper palate    Social History   Socioeconomic History  . Marital status:  Married    Spouse name:  [name]  . Number of children: 3  . Years of education: 78  . Highest education level: Master's degree (e.g., MA, MS, MEng, MEd, MSW, MBA)  Occupational History  . Occupation: [employer]: retired  Tobacco Use  . Smoking status: Former Smoker    Quit date: 04/24/1978    Years since quitting: 42.7  . Smokeless tobacco: Never Used  Vaping Use  . Vaping Use: Never used  Substance and Sexual Activity  . Alcohol use: Yes    Alcohol/week: 2.0 standard drinks    Types: 2 Standard drinks or equivalent per week    Comment: socially  . Drug use: No  . Sexual activity: Yes  Other Topics Concern  . Not on file  Social History Narrative   Had 2 surgeries in the past 2 years ago and has not got back into exercising. Will start back with doing this. Wife retiring in April.   Social Determinants of Health   Financial Resource Strain: Not on file  Food Insecurity: Not on file  Transportation Needs: Not on file  Physical Activity: Not on file  Stress: Not on file  Social Connections:  Not on file  Intimate Partner Violence: Not on file    Outpatient Medications Prior to Visit  Medication Sig Dispense Refill  . aspirin EC 81 MG tablet Take 81 mg by mouth daily.    Marland Kitchen atorvastatin (LIPITOR) 40 MG tablet TAKE 1 TABLET BY MOUTH AT  BEDTIME 90 tablet 3  . cetirizine (ZYRTEC) 10 MG tablet Take 10 mg by mouth as needed for allergies.    . fenofibrate 160 MG tablet TAKE 1 TABLET BY MOUTH  DAILY 90 tablet 3  . finasteride (PROSCAR) 5 MG tablet TAKE 1 TABLET BY MOUTH  DAILY 90 tablet 3  . FLUoxetine (PROZAC) 20 MG capsule TAKE 1 CAPSULE BY MOUTH  DAILY 90 capsule 3  . fluticasone (FLONASE) 50 MCG/ACT nasal spray Place 2 sprays into both nostrils daily as needed for allergies or rhinitis. 48 g 3  . metFORMIN (GLUCOPHAGE-XR) 500 MG 24 hr tablet Take 1 tablet (500 mg total) by mouth daily with breakfast. 1 tablet 0  . metoprolol tartrate (LOPRESSOR) 50 MG  tablet Take 1 tablet (50 mg total) by mouth daily. 90 tablet 3  . Multiple Vitamin (MULTIVITAMIN WITH MINERALS) TABS tablet Take 1 tablet by mouth daily.    Marland Kitchen oxybutynin (DITROPAN) 5 MG tablet Take 1 tablet (5 mg total) by mouth 2 (two) times daily. 180 tablet 3  . tamsulosin (FLOMAX) 0.4 MG CAPS capsule Take 1 capsule (0.4 mg total) by mouth daily after supper. 90 capsule 3   No facility-administered medications prior to visit.    No Known Allergies  ROS Review of Systems    Objective:    Physical Exam Vitals reviewed.  Constitutional:      Appearance: He is well-developed.  HENT:     Head: Normocephalic and atraumatic.  Eyes:     Conjunctiva/sclera: Conjunctivae normal.  Cardiovascular:     Rate and Rhythm: Normal rate.  Pulmonary:     Effort: Pulmonary effort is normal.  Skin:    General: Skin is dry.     Coloration: Skin is not pale.  Neurological:     Mental Status: He is alert and oriented to person, place, and time.  Psychiatric:        Behavior: Behavior normal.     BP 128/71   Pulse (!) 55   Ht 5' 7"  (1.702 m)   Wt 172 lb (78 kg)   SpO2 95%   BMI 26.94 kg/m  Wt Readings from Last 3 Encounters:  01/22/21 172 lb (78 kg)  12/25/20 174 lb (78.9 kg)  08/26/20 180 lb (81.6 kg)     Health Maintenance Due  Topic Date Due  . COLONOSCOPY (Pts 45-110yr Insurance coverage will need to be confirmed)  06/24/2016  . OPHTHALMOLOGY EXAM  09/28/2019  . COVID-19 Vaccine (4 - Booster for Pfizer series) 10/27/2020    There are no preventive care reminders to display for this patient.  Lab Results  Component Value Date   TSH 1.470 09/09/2020   Lab Results  Component Value Date   WBC 8.7 12/25/2020   HGB 16.3 12/25/2020   HCT 48.7 12/25/2020   MCV 88 12/25/2020   PLT 243 12/25/2020   Lab Results  Component Value Date   NA 140 12/25/2020   K 5.1 12/25/2020   CO2 25 12/25/2020   GLUCOSE 92 12/25/2020   BUN 14 12/25/2020   CREATININE 1.07 12/25/2020    BILITOT 0.7 12/25/2020   ALKPHOS 64 12/25/2020   AST 24 12/25/2020   ALT  17 12/25/2020   PROT 7.0 12/25/2020   ALBUMIN 4.4 12/25/2020   CALCIUM 9.6 12/25/2020   ANIONGAP 8 03/01/2017   EGFR 72 12/25/2020   Lab Results  Component Value Date   CHOL 118 12/31/2019   Lab Results  Component Value Date   HDL 34 (L) 12/31/2019   Lab Results  Component Value Date   LDLCALC 61 12/31/2019   Lab Results  Component Value Date   TRIG 126 12/31/2019   Lab Results  Component Value Date   CHOLHDL 5.4 (H) 10/16/2018   Lab Results  Component Value Date   HGBA1C 6.0 (H) 12/25/2020      Assessment & Plan:   Problem List Items Addressed This Visit      Respiratory   Pulmonary fibrosis, unspecified [facility])    Medical pulmonology today as it has been a month and he has not heard back on his referral.  We were able to get him an appointment at the end of June.        Other   Weight loss    Is down 2 more pounds.  I really just want a keep an eye on this just make sure eating adequate protein.       Other Visit Diagnoses    Bilateral impacted cerumen    -  Primary     Cerumen impaction, bilat - irrigation performed.     Indication: Cerumen impaction of the ear(s) Medical necessity statement: On physical examination, cerumen impairs clinically significant portions of the external auditory canal, and tympanic membrane. Noted obstructive, copious cerumen that cannot be removed without magnification and instrumentation.  Consent: Discussed benefits and risks of procedure and verbal consent obtained Procedure: Patient was prepped for the procedure. Utilized an otoscope to assess and take note of the ear canal, the tympanic membrane, and the presence, amount, and placement of the cerumen. Gentle water irrigation and soft plastic curette was utilized to remove cerumen.  Post procedure examination: shows cerumen was completely removed. Patient tolerated procedure well. The patient is made  aware that they may experience temporary vertigo, temporary hearing loss, and temporary discomfort. If these symptom last for more than 24 hours to call the clinic or proceed to the ED.    No orders of the defined types were placed in this encounter.   Follow-up: No follow-ups on file.    Beatrice Lecher, MD

## 2021-01-23 ENCOUNTER — Encounter: Payer: Self-pay | Admitting: Family Medicine

## 2021-01-27 ENCOUNTER — Telehealth: Payer: Self-pay | Admitting: Family Medicine

## 2021-01-27 NOTE — Chronic Care Management (AMB) (Signed)
  Chronic Care Management   Note  01/27/2021 Name: Todd Parsons MRN: [id number] DOB: [birth year]  Todd Parsons is a 75 y.o. year old male who is a primary care patient of Madilyn Fireman, Rene Kocher, MD. I reached out to Todd Parsons by phone today in response to a referral sent by Todd Parsons PCP, Hali Marry, MD.   Todd Parsons was given information about Chronic Care Management services today including:  1. CCM service includes personalized support from designated clinical staff supervised by his physician, including individualized plan of care and coordination with other care providers 2. 24/7 contact phone numbers for assistance for urgent and routine care needs. 3. Service will only be billed when office clinical staff spend 20 minutes or more in a month to coordinate care. 4. Only one practitioner may furnish and bill the service in a calendar month. 5. The patient may stop CCM services at any time (effective at the end of the month) by phone call to the office staff.   Patient agreed to services and verbal consent obtained.   Follow up plan:   Todd Parsons

## 2021-02-24 ENCOUNTER — Institutional Professional Consult (permissible substitution): Payer: Medicare Other | Admitting: Internal Medicine

## 2021-03-03 ENCOUNTER — Ambulatory Visit: Payer: Medicare Other

## 2021-03-10 ENCOUNTER — Ambulatory Visit (INDEPENDENT_AMBULATORY_CARE_PROVIDER_SITE_OTHER): Payer: Medicare Other | Admitting: Pharmacist

## 2021-03-10 ENCOUNTER — Other Ambulatory Visit: Payer: Self-pay

## 2021-03-10 DIAGNOSIS — I10 Essential (primary) hypertension: Secondary | ICD-10-CM | POA: Diagnosis not present

## 2021-03-10 DIAGNOSIS — E1159 Type 2 diabetes mellitus with other circulatory complications: Secondary | ICD-10-CM

## 2021-03-10 DIAGNOSIS — E785 Hyperlipidemia, unspecified: Secondary | ICD-10-CM | POA: Diagnosis not present

## 2021-03-10 NOTE — Progress Notes (Signed)
Chronic Care Management Pharmacy Note  03/11/2021 Name:  Todd Parsons MRN:  [id number] DOB:  1945-11-08  Summary: addressed DM, HTN, HLD  Recommendations/Changes made from today's visit: Recommend consider adjustment of metoprolol tartrate to succinate in order for better duration of activity to support a once daily dosing strategy.  Plan: f/u with pharmacist in 3 months  Subjective: Todd Parsons is an 75 y.o. year old male who is a primary patient of Metheney, Rene Kocher, MD.  The CCM team was consulted for assistance with disease management and care coordination needs.    Engaged with patient by telephone for initial visit in response to provider referral for pharmacy case management and/or care coordination services.   Consent to Services:  The patient was given information about Chronic Care Management services, agreed to services, and gave verbal consent prior to initiation of services.  Please see initial visit note for detailed documentation.   Patient Care Team: Hali Marry, MD as PCP - General (Family Medicine) Ellis Parents, MD as Attending Physician (Cardiology) Darius Bump, [facility] as Pharmacist (Pharmacist)  Objective:  Lab Results  Component Value Date   CREATININE 1.07 12/25/2020   CREATININE 1.08 09/09/2020   CREATININE 1.08 12/31/2019    Lab Results  Component Value Date   HGBA1C 6.0 (H) 12/25/2020       Component Value Date/Time   CHOL 118 12/31/2019 1101   TRIG 126 12/31/2019 1101   HDL 34 (L) 12/31/2019 1101   CHOLHDL 5.4 (H) 10/16/2018 1159   VLDL 29 04/05/2016 0833   LDLCALC 61 12/31/2019 1101   LDLCALC 65 10/16/2018 1159    Hepatic Function Latest Ref Rng & Units 12/25/2020 09/09/2020 12/31/2019  Total Protein 6.0 - 8.5 g/dL 7.0 7.0 6.8  Albumin 3.7 - 4.7 g/dL 4.4 4.3 4.4  AST 0 - 40 IU/L 24 28 25   ALT 0 - 44 IU/L 17 22 21   Alk Phosphatase 44 - 121 IU/L 64 68 57  Total Bilirubin 0.0 - 1.2 mg/dL 0.7 0.9 0.8  Bilirubin, Direct 0.0  - 0.2 mg/dL - - -    Lab Results  Component Value Date/Time   TSH 1.470 09/09/2020 02:18 PM   TSH 3.31 04/05/2016 08:33 AM    CBC Latest Ref Rng & Units 12/25/2020 09/30/2017 03/02/2017  WBC 3.4 - 10.8 x10E3/uL 8.7 7.9 18.7(H)  Hemoglobin 13.0 - 17.7 g/dL 16.3 14.8 10.7(L)  Hematocrit 37.5 - 51.0 % 48.7 42.7 32.6(L)  Platelets 150 - 450 x10E3/uL 243 225 150    Social History   Tobacco Use  Smoking Status Former   Pack years: 0.00   Types: Cigarettes   Quit date: 04/24/1978   Years since quitting: 42.9  Smokeless Tobacco Never   BP Readings from Last 3 Encounters:  01/22/21 128/71  12/25/20 130/84  08/26/20 127/66   Pulse Readings from Last 3 Encounters:  01/22/21 (!) 55  12/25/20 95  08/26/20 (!) 51   Wt Readings from Last 3 Encounters:  01/22/21 172 lb (78 kg)  12/25/20 174 lb (78.9 kg)  08/26/20 180 lb (81.6 kg)    Assessment: Review of patient past medical history, allergies, medications, health status, including review of consultants reports, laboratory and other test data, was performed as part of comprehensive evaluation and provision of chronic care management services.   SDOH:  (Social Determinants of Health) assessments and interventions performed:    CCM Care Plan  No Known Allergies  Medications Reviewed Today     Reviewed  by Darius Bump, [place] (Pharmacist) on 03/10/21 at 1428  Med List Status: <None>   Medication Order Taking? Sig Documenting Provider Last Dose Status Informant  aspirin EC 81 MG tablet [id number] Yes Take 81 mg by mouth daily. [provider] Taking Active Self  atorvastatin (LIPITOR) 40 MG tablet [id number] Yes TAKE 1 TABLET BY MOUTH AT  BEDTIME Hali Marry, MD Taking Active   cetirizine (ZYRTEC) 10 MG tablet [id number] Yes Take 10 mg by mouth as needed for allergies. [provider] Taking Active   fenofibrate 160 MG tablet [id number] Yes TAKE 1 TABLET BY MOUTH  DAILY Hali Marry, MD Taking  Active   finasteride (PROSCAR) 5 MG tablet [id number] Yes TAKE 1 TABLET BY MOUTH  DAILY Hali Marry, MD Taking Active   FLUoxetine (PROZAC) 20 MG capsule [id number] Yes TAKE 1 CAPSULE BY MOUTH  DAILY Hali Marry, MD Taking Active   fluticasone [facility]) 50 MCG/ACT nasal spray [id number] Yes Place 2 sprays into both nostrils daily as needed for allergies or rhinitis. Hali Marry, MD Taking Active   metFORMIN (GLUCOPHAGE-XR) 500 MG 24 hr tablet [id number] Yes Take 1 tablet (500 mg total) by mouth daily with breakfast. Hali Marry, MD Taking Active   metoprolol tartrate (LOPRESSOR) 50 MG tablet [id number] Yes Take 1 tablet (50 mg total) by mouth daily. Hali Marry, MD Taking Active   oxybutynin (DITROPAN) 5 MG tablet [id number] Yes Take 1 tablet (5 mg total) by mouth 2 (two) times daily. Hali Marry, MD Taking Active   tamsulosin [facility]) 0.4 MG CAPS capsule [id number] Yes Take 1 capsule (0.4 mg total) by mouth daily after supper. Hali Marry, MD Taking Active             Patient Active Problem List   Diagnosis Date Noted   Weight loss 08/26/2020   Unspecified mood (affective) disorder ([place]) 08/26/2020   Benign prostatic hyperplasia (BPH) with urinary urgency 04/25/2020   Word finding difficulty 04/25/2020   Seborrheic keratoses 09/10/2019   Acute pain of right shoulder 09/10/2019   CAD (coronary artery disease) 04/15/2017   Wears hearing aid 06/02/2016   OAB (overactive bladder) 04/02/2016   Primary osteoarthritis of both knees 02/10/2016   Pulmonary fibrosis, unspecified ([place]) 12/22/2015   H/O aortic valve replacement 09/15/2015   History of atrial flutter 09/15/2015   Type 2 diabetes mellitus with cardiac complication ([place]) 77/93/9030   Restrictive airway disease 08/09/2014   Hx of arthroscopy of knee 06/08/2011   Essential hypertension, benign 05/05/2011   Hyperlipidemia LDL goal <100 05/05/2011   OSA (obstructive  sleep apnea) 05/05/2011   Thoracic aortic aneurysm without rupture ([place]) 05/05/2011   Congenital insufficiency of aortic valve 05/05/2011   History of bicuspid aortic valve 05/05/2011   Vitamin D deficiency 05/05/2011    Immunization History  Administered Date(s) Administered   Fluad Quad(high Dose 65+) 05/23/2019, 06/20/2020   Influenza Split 05/19/2012   Influenza Whole 05/05/2011   Influenza, High Dose Seasonal PF 05/30/2017, 06/13/2018   Influenza,inj,Quad PF,6+ Mos 07/27/2013, 07/08/2014, 06/21/2015, 06/02/2016   Influenza-Unspecified 07/06/2015   PFIZER(Purple Top)SARS-COV-2 Vaccination 11/04/2019, 11/28/2019, 07/27/2020   Pneumococcal Conjugate-13 07/24/2014   Pneumococcal Polysaccharide-23 07/12/2010   Tdap 08/19/2011   Zoster, Live 06/11/2008    Conditions to be addressed/monitored: HTN, HLD, and DMII  Care Plan : Medication Management  Updates made by Darius Bump, [place] since 03/11/2021 12:00 AM     Problem: HTN, HLD, DM  Long-Range Goal: Disease Progression Prevention   Start Date: 03/10/2021  This Visit's Progress: On track  Priority: High  Note:   Current Barriers:  None at present Transition of care: recently started getting medications from [facility]):  Over the next 90 days, patient will maintain control of chronic conditions as evidenced by medication fill history, lab values, and vital signs  through collaboration with PharmD and provider.   Interventions: 1:1 collaboration with Hali Marry, MD regarding development and update of comprehensive plan of care as evidenced by provider attestation and co-signature Inter-disciplinary care team collaboration (see longitudinal plan of care) Comprehensive medication review performed; medication list updated in electronic medical record  Diabetes:  Controlled; current treatment:metformin 559m daily; a1c 6 (classified pre-diabetes range)   Current glucose readings: not  currently checking  Denies hypoglycemic/hyperglycemic symptoms  Current meal patterns: breakfast: eggs, or cereal; lunch: leftovers; dinner: meat + veggies; snacks: cookies; drinks: water, diet coke, water + powder   Current exercise: does yardwork  Recommended continue current regimen,  Hypertension:  Controlled; current treatment: metoprolol tartrate 529mdaily;   Current home readings: not currently checking  Denies hypotensive/hypertensive symptoms  Recommended consider adjustment of metoprolol tartrate to succinate in order for better duration of activity to support a once daily dosing strategy. Hyperlipidemia:  Controlled; current treatment:atorvastatin 4043m  Recommended continue current regimen  Patient Goals/Self-Care Activities Over the next 90 days, patient will:  take medications as prescribed  Follow Up Plan: Telephone follow up appointment with care management team member scheduled for:  3 months      Medication Assistance: None required.  Patient affirms current coverage meets needs.  Patient's preferred pharmacy is:  [facility] [place]one: [phone]: [phone]  ([address] [place]one: [phone]: [address] [place] [place]one: [phone]: [phone] pill box? Yes Pt endorses 100% compliance  Follow Up:  Patient agrees to Care Plan and Follow-up.  Plan: Telephone follow up appointment with care management team member scheduled for:  3 months  KeeDarius Bump

## 2021-03-11 MED ORDER — METOPROLOL SUCCINATE ER 50 MG PO TB24: 50.0000 mg | ORAL_TABLET | Freq: Every day | ORAL | 1 refills | Status: DC

## 2021-03-11 NOTE — Addendum Note (Signed)
Addended by: Beatrice Lecher D on: 03/11/2021 01:03 PM   Modules accepted: Orders

## 2021-03-11 NOTE — Patient Instructions (Signed)
Visit Information   PATIENT GOALS:   Goals Addressed             This Visit's Progress    Medication Management       Patient Goals/Self-Care Activities Over the next 90 days, patient will:  take medications as prescribed  Follow Up Plan: Telephone follow up appointment with care management team member scheduled for:  3 months          Consent to CCM Services: Todd Parsons was given information about Chronic Care Management services today including:  CCM service includes personalized support from designated clinical staff supervised by his physician, including individualized plan of care and coordination with other care providers 24/7 contact phone numbers for assistance for urgent and routine care needs. Service will only be billed when office clinical staff spend 20 minutes or more in a month to coordinate care. Only one practitioner may furnish and bill the service in a calendar month. The patient may stop CCM services at any time (effective at the end of the month) by phone call to the office staff. The patient will be responsible for cost sharing (co-pay) of up to 20% of the service fee (after annual deductible is met).  Patient agreed to services and verbal consent obtained.   Patient verbalizes understanding of instructions provided today and agrees to view in [place].   Telephone follow up appointment with care management team member scheduled for: 3 months  [place] CARE PLAN: Patient Care Plan: Medication Management     Problem Identified: HTN, HLD, DM      Long-Range Goal: Disease Progression Prevention   Start Date: 03/10/2021  This Visit's Progress: On track  Priority: High  Note:   Current Barriers:  None at present Transition of care: recently started getting medications from [place]):  Over the next 90 days, patient will maintain control of chronic conditions as evidenced by medication fill history, lab  values, and vital signs  through collaboration with PharmD and provider.   Interventions: 1:1 collaboration with Hali Marry, MD regarding development and update of comprehensive plan of care as evidenced by provider attestation and co-signature Inter-disciplinary care team collaboration (see longitudinal plan of care) Comprehensive medication review performed; medication list updated in electronic medical record  Diabetes:  Controlled; current treatment:metformin 533m daily; a1c 6 (classified pre-diabetes range)   Current glucose readings: not currently checking  Denies hypoglycemic/hyperglycemic symptoms  Current meal patterns: breakfast: eggs, or cereal; lunch: leftovers; dinner: meat + veggies; snacks: cookies; drinks: water, diet coke, water + powder   Current exercise: does yardwork  Recommended continue current regimen,  Hypertension:  Controlled; current treatment: metoprolol tartrate 550mdaily;   Current home readings: not currently checking  Denies hypotensive/hypertensive symptoms  Recommended consider adjustment of metoprolol tartrate to succinate in order for better duration of activity to support a once daily dosing strategy. Hyperlipidemia:  Controlled; current treatment:atorvastatin 4025m  Recommended continue current regimen  Patient Goals/Self-Care Activities Over the next 90 days, patient will:  take medications as prescribed  Follow Up Plan: Telephone follow up appointment with care management team member scheduled for:  3 months

## 2021-03-25 ENCOUNTER — Institutional Professional Consult (permissible substitution): Payer: Medicare Other | Admitting: Pulmonary Disease

## 2021-04-02 ENCOUNTER — Ambulatory Visit: Payer: Medicare Other | Admitting: Pulmonary Disease

## 2021-04-02 ENCOUNTER — Other Ambulatory Visit: Payer: Self-pay

## 2021-04-02 ENCOUNTER — Encounter: Payer: Self-pay | Admitting: Pulmonary Disease

## 2021-04-02 VITALS — BP 138/78 | HR 57 | Ht 67.0 in | Wt 171.8 lb

## 2021-04-02 DIAGNOSIS — J849 Interstitial pulmonary disease, unspecified: Secondary | ICD-10-CM | POA: Diagnosis not present

## 2021-04-02 NOTE — Progress Notes (Signed)
Todd Parsons    DK:[id number]    09-04-1946  Primary Care Physician:Metheney, Rene Kocher, MD  Referring Physician: Hali Marry, MD [address] [place],  [place] [address]  Chief complaint: Consult for ILD  HPI: 75 year old ex-smoker with history of a flutter, aortic valve replacement, aortic aneurysm repair, hyperlipidemia, diabetes  Referred for evaluation of pulmonary fibrosis He was previously seen in 2018 by Dr. Elsworth Soho in the pulmonary clinic when he had a CT scan which showed pulmonary fibrosis.  He has been lost to follow-up since then. Complains of chronic dyspnea on exertion which has been stable over several years.  No symptoms at rest.  Denies any cough, sputum production, fevers, chills  Pets: Has a dog Occupation: Retired Advice worker for [employer] Exposures: No known exposures.  No mold, hot tub, Jacuzzi.  No feather pillows or comforter Smoking history: 17-pack-year smoker.  Quit in 1979 Travel history: Originally from [place].  No significant recent travel Relevant family history: No family history of lung disease   Outpatient Encounter Medications as of 04/02/2021  Medication Sig   aspirin EC 81 MG tablet Take 81 mg by mouth daily.   atorvastatin (LIPITOR) 40 MG tablet TAKE 1 TABLET BY MOUTH AT  BEDTIME   cetirizine (ZYRTEC) 10 MG tablet Take 10 mg by mouth as needed for allergies.   fenofibrate 160 MG tablet TAKE 1 TABLET BY MOUTH  DAILY   finasteride (PROSCAR) 5 MG tablet TAKE 1 TABLET BY MOUTH  DAILY   FLUoxetine (PROZAC) 20 MG capsule TAKE 1 CAPSULE BY MOUTH  DAILY   fluticasone (FLONASE) 50 MCG/ACT nasal spray Place 2 sprays into both nostrils daily as needed for allergies or rhinitis.   metFORMIN (GLUCOPHAGE-XR) 500 MG 24 hr tablet Take 1 tablet (500 mg total) by mouth daily with breakfast.   metoprolol succinate (TOPROL-XL) 50 MG 24 hr tablet Take 1 tablet (50 mg total) by mouth daily. Take with or immediately  following a meal.   oxybutynin (DITROPAN) 5 MG tablet Take 1 tablet (5 mg total) by mouth 2 (two) times daily.   tamsulosin (FLOMAX) 0.4 MG CAPS capsule Take 1 capsule (0.4 mg total) by mouth daily after supper.   No facility-administered encounter medications on file as of 04/02/2021.    Allergies as of 04/02/2021   (No Known Allergies)    Past Medical History:  Diagnosis Date   Arthritis    osteoarthritis   Bicuspid aortic valve    Coronary artery disease    Dysrhythmia    atrial flutter   Echocardiogram with ECG monitoring 01/12/2010   60-65% EF   Hyperlipidemia    Hypertension    Pre-diabetes    Sleep apnea     Past Surgical History:  Procedure Laterality Date   A-FLUTTER ABLATION  2017   ACROMIO-CLAVICULAR JOINT REPAIR Right 1980s       apnea device implant     then removed.     CARDIAC CATHETERIZATION  07/2015   CARDIAC VALVE REPLACEMENT  08/05/2015   Aortic valve   CORONARY ARTERY BYPASS GRAFT  08/05/2015   left knee arthoscopy     NOSE SURGERY  late 50's   to fix a broken nose   TONSILECTOMY, ADENOIDECTOMY, BILATERAL MYRINGOTOMY AND TUBES     TOTAL KNEE ARTHROPLASTY Right 02/28/2017   Procedure: RIGHT TOTAL KNEE ARTHROPLASTY;  Surgeon: Dorna Leitz, MD;  Location: [facility];  Service: Orthopedics;  Laterality: Right;  Family History  Problem Relation Age of Onset   Heart disease Father 44       bypass surgery   Stroke Mother 17   Cancer Mother        upper palate    Social History   Socioeconomic History   Marital status: Married    Spouse name:  [name]   Number of children: 3   Years of education: 18   Highest education level: Conservator, museum/gallery (e.g., MA, MS, MEng, MEd, MSW, MBA)  Occupational History   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 Will start back with doing this. Wife retiring in April.   Social Determinants of Health   Financial Resource Strain: Not on file  Food Insecurity: Not on file  Transportation Needs: Not on file  Physical Activity: Not on file  Stress: Not on file  Social Connections: Not on file  Intimate Partner Violence: Not on file    Review of systems: Review of Systems  Constitutional: Negative for fever and chills.  HENT: Negative.   Eyes: Negative for blurred vision.  Respiratory: as per HPI  Cardiovascular: Negative for chest pain and palpitations.  Gastrointestinal: Negative for vomiting, diarrhea, blood per rectum. Genitourinary: Negative for dysuria, urgency, frequency and hematuria.  Musculoskeletal: Negative for myalgias, back pain and joint pain.  Skin: Negative for itching and rash.  Neurological: Negative for dizziness, tremors, focal weakness, seizures and loss of consciousness.  Endo/Heme/Allergies: Negative for environmental allergies.  Psychiatric/Behavioral: Negative for depression, suicidal ideas and hallucinations.  All other systems reviewed and are negative.  Physical Exam: Blood pressure 138/78, pulse (!) 57, height '5\' 7"'$  (1.702 m), weight 171 lb 12.8 oz (77.9 kg), SpO2 94 %. Gen:      No acute distress HEENT:  EOMI, sclera anicteric Neck:     No masses; no thyromegaly Lungs:   Bibasal crackles CV:         Regular rate and rhythm; no murmurs Abd:      + bowel sounds; soft, non-tender; no palpable masses, no distension Ext:    No edema; adequate peripheral perfusion Skin:      Warm and dry; no rash Neuro: alert and oriented x 3 Psych:  normal mood and affect  Data Reviewed: Imaging: High-res CT 07/22/2017-pulmonary fibrosis, probable UIP High-res CT 09/09/2020-slight worsening of pulmonary fibrosis and probable UIP pattern I have reviewed the images personally.  PFTs: 08/24/2017 FVC 2.02 [50%], FEV1 1.89 [69%], F/F 94, TLC 3.62 [56%], DLCO 15.57 [5%] Severe restriction and diffusion defect  Labs: CTD, hypersensitivity serologies 08/24/2017-negative ANA, CCP, negative hypersensitivity panel  Assessment:  Pulmonary fibrosis He had CT scans on record which shows pulmonary fibrosis slightly progressive.  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  There may be a hint of honeycombing at the base as well by my review.  Note CT reports from even before from the [place] that shows reticulation so the process appears to be progressive  Although the initial scan was read as hypersensitivity pneumonitis he does not have any known exposures or signs and symptoms of connective tissue disease  PFTs in 2018 noted for severe restriction and diffusion impairment consistent with interstitial lung disease  We will send serologies for ILD work-up, questionnaire for exposures, schedule PFTs to check for progression Return to clinic after these tests for discussion and plan for next steps   Plan/Recommendations: Serologies, PFTs, ILD questionnaire  Marshell Garfinkel MD [facility] 04/02/2021, 9:38 AM  CC: Hali Marry, *

## 2021-04-02 NOTE — Patient Instructions (Signed)
I have reviewed your studies so far which shows scarring in the lung.  This is called pulmonary fibrosis We will get some blood test today and give you questionnaire to further investigate the reason for this We will schedule you for pulmonary function test or lung function test for further evaluation of her overall your lungs are looking  I will see back in clinic after these test to review results and plan for next steps

## 2021-04-06 LAB — ANTI-SCLERODERMA ANTIBODY: Scleroderma (Scl-70) (ENA) Antibody, IgG: <1 AI

## 2021-04-06 LAB — SJOGREN'S SYNDROME ANTIBODS(SSA + SSB)
SSA (Ro) (ENA) Antibody, IgG: <1 AI
SSB (La) (ENA) Antibody, IgG: <1 AI

## 2021-04-06 LAB — ANA,IFA RA DIAG PNL W/RFLX TIT/PATN
Anti Nuclear Antibody (ANA): NEGATIVE
Cyclic Citrullin Peptide Ab: <16 UNITS
Rheumatoid fact SerPl-aCnc: <14 IU/mL (ref <14)

## 2021-04-06 LAB — ANCA SCREEN W REFLEX TITER: ANCA Screen: NEGATIVE

## 2021-04-07 LAB — HYPERSENSITIVITY PNEUMONITIS
A. Pullulans Abs: NEGATIVE
A.Fumigatus #1 Abs: NEGATIVE
Micropolyspora faeni, IgG: NEGATIVE
Pigeon Serum Abs: NEGATIVE
Thermoact. Saccharii: NEGATIVE
Thermoactinomyces vulgaris, IgG: NEGATIVE

## 2021-04-16 LAB — MYOMARKER 3 PLUS PROFILE (RDL)

## 2021-04-29 ENCOUNTER — Other Ambulatory Visit: Payer: Self-pay

## 2021-05-20 ENCOUNTER — Telehealth: Payer: Self-pay | Admitting: Family Medicine

## 2021-05-20 NOTE — Telephone Encounter (Signed)
Left message for patient to call back and schedule Medicare Annual Wellness Visit (AWV) either virtually or in office. I gave my number [phone]   Last AWV ; 10/02/2018 please schedule at anytime with health coach

## 2021-05-23 ENCOUNTER — Other Ambulatory Visit: Payer: Self-pay

## 2021-05-23 ENCOUNTER — Ambulatory Visit (INDEPENDENT_AMBULATORY_CARE_PROVIDER_SITE_OTHER): Payer: Medicare Other

## 2021-05-23 DIAGNOSIS — Z0000 Encounter for general adult medical examination without abnormal findings: Secondary | ICD-10-CM

## 2021-05-23 DIAGNOSIS — Z1211 Encounter for screening for malignant neoplasm of colon: Secondary | ICD-10-CM

## 2021-05-23 NOTE — Progress Notes (Signed)
Virtual Visit via Telephone Note  I connected with  Todd Parsons on 05/23/21 at  2:00 PM EDT by telephone and verified that I am speaking with the correct person using two identifiers.  Medicare Annual Wellness visit completed telephonically due to Covid-19 pandemic.   Persons participating in this call: This Health Coach and this patient.   Location: Patient: Home Provider: Office   I discussed the limitations, risks, security and privacy concerns of performing an evaluation and management service by telephone and the availability of in person appointments. The patient expressed understanding and agreed to proceed.  Unable to perform video visit due to video visit attempted and failed and/or patient does not have video capability.   Some vital signs may be absent or patient reported.   Willette Brace, LPN   Subjective:   Todd Parsons is a 75 y.o. male who presents for Medicare Annual/Subsequent preventive examination.  Review of Systems     Cardiac Risk Factors include: advanced age (>82mn, >>50women);hypertension;diabetes mellitus;dyslipidemia;male gender     Objective:    There were no vitals filed for this visit. There is no height or weight on file to calculate BMI.  Advanced Directives 05/23/2021 10/02/2018 04/06/2017 02/18/2017 02/28/2014 01/21/2014  Does Patient Have a Medical Advance Directive? Yes No Yes No Patient does not have advance directive;Patient would not like information Patient has advance directive, copy not in chart  Type of Advance Directive Healthcare Power of [place] - - [place]  Copy of [place] Chart? No - copy requested - No - copy requested - - -  Would patient like information on creating a medical advance directive? - Yes (MAU/Ambulatory/Procedural Areas - Information given) - Yes (MAU/Ambulatory/Procedural Areas - Information given) - -    Current  Medications (verified) Outpatient Encounter Medications as of 05/23/2021  Medication Sig   aspirin EC 81 MG tablet Take 81 mg by mouth daily.   atorvastatin (LIPITOR) 40 MG tablet TAKE 1 TABLET BY MOUTH AT  BEDTIME   cetirizine (ZYRTEC) 10 MG tablet Take 10 mg by mouth as needed for allergies.   fenofibrate 160 MG tablet TAKE 1 TABLET BY MOUTH  DAILY   finasteride (PROSCAR) 5 MG tablet TAKE 1 TABLET BY MOUTH  DAILY   FLUoxetine (PROZAC) 20 MG capsule TAKE 1 CAPSULE BY MOUTH  DAILY   fluticasone (FLONASE) 50 MCG/ACT nasal spray Place 2 sprays into both nostrils daily as needed for allergies or rhinitis.   metFORMIN (GLUCOPHAGE-XR) 500 MG 24 hr tablet Take 1 tablet (500 mg total) by mouth daily with breakfast.   metoprolol succinate (TOPROL-XL) 50 MG 24 hr tablet Take 1 tablet (50 mg total) by mouth daily. Take with or immediately following a meal.   oxybutynin (DITROPAN) 5 MG tablet Take 1 tablet (5 mg total) by mouth 2 (two) times daily.   tamsulosin (FLOMAX) 0.4 MG CAPS capsule Take 1 capsule (0.4 mg total) by mouth daily after supper.   PFIZER-BIONT COVID-19 VAC-TRIS SUSP injection    No facility-administered encounter medications on file as of 05/23/2021.    Allergies (verified) Patient has no known allergies.   History: Past Medical History:  Diagnosis Date   Arthritis    osteoarthritis   Bicuspid aortic valve    Coronary artery disease    Dysrhythmia    atrial flutter   Echocardiogram with ECG monitoring 01/12/2010   60-65% EF   Hyperlipidemia  Hypertension    Pre-diabetes    Sleep apnea    Past Surgical History:  Procedure Laterality Date   A-FLUTTER ABLATION  2017   ACROMIO-CLAVICULAR JOINT REPAIR Right 1980s       apnea device implant     then removed.     CARDIAC CATHETERIZATION  07/2015   CARDIAC VALVE REPLACEMENT  08/05/2015   Aortic valve   CORONARY ARTERY BYPASS GRAFT  08/05/2015   left knee arthoscopy     NOSE SURGERY  late 50's   to fix a broken nose    TONSILECTOMY, ADENOIDECTOMY, BILATERAL MYRINGOTOMY AND TUBES     TOTAL KNEE ARTHROPLASTY Right 02/28/2017   Procedure: RIGHT TOTAL KNEE ARTHROPLASTY;  Surgeon: Dorna Leitz, MD;  Location: [facility];  Service: Orthopedics;  Laterality: Right;   Family History  Problem Relation Age of Onset   Heart disease Father 48       bypass surgery   Stroke Mother 93   Cancer Mother        upper palate   Social History   Socioeconomic History   Marital status: Married    Spouse name:  [name]   Number of children: 3   Years of education: 18   Highest education level: Master's degree (e.g., MA, MS, MEng, MEd, MSW, MBA)  Occupational History   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 Will start back with doing this. Wife retiring in April.   Social Determinants of Health   Financial Resource Strain: Low Risk    Difficulty of Paying Living Expenses: Not hard at all  Food Insecurity: No Food Insecurity   Worried About Charity fundraiser in the Last Year: Never true   [place] in the Last Year: Never true  Transportation Needs: No Transportation Needs   Lack of Transportation (Medical): No   Lack of Transportation (Non-Medical): No  Physical Activity: Inactive   Days of Exercise per Week: 0 days   Minutes of Exercise per Session: 0 min  Stress: No Stress Concern Present   Feeling of Stress : Not at all  Social Connections: Moderately Isolated    Frequency of Communication with Friends and Family: More than three times a week   Frequency of Social Gatherings with Friends and Family: Once a week   Attends Religious Services: Never   Marine scientist or Organizations: No   Attends Music therapist: Never   Marital Status: Married    Tobacco Counseling Counseling given: Not Answered   Clinical Intake:  Pre-visit preparation completed: Yes  Pain : No/denies pain     BMI - recorded: 26.91 Nutritional Status: BMI 25 -29 Overweight Nutritional Risks: None Diabetes: Yes CBG done?: No Did pt. bring in CBG monitor from home?: No  How often do you need to have  someone help you when you read instructions, pamphlets, or other written materials from your doctor or pharmacy?: 1 - Never  Diabetic?Nutrition Risk Assessment:  Has the patient had any N/V/D within the last 2 months?  No  Does the patient have any non-healing wounds?  No  Has the patient had any unintentional weight loss or weight gain?  No   Diabetes:  Is the patient diabetic?  Yes  If diabetic, was a CBG obtained today?  No  Did the patient bring in their glucometer from home?  No  How often do you monitor your CBG's? N/A.   Financial Strains and Diabetes Management:  Are you having any financial strains with the device, your supplies or your medication? No .  Does the patient want to be seen by Chronic Care Management for management of their diabetes?  No  Would the patient like to be referred to a Nutritionist or for Diabetic Management?  No   Diabetic Exams:  Diabetic Eye Exam: Overdue for diabetic eye exam. Pt has been advised about the importance in completing this exam. Patient advised to call and schedule an eye exam. Diabetic Foot Exam: Completed 08/26/20   Interpreter Needed?: No  Information entered by :: Charlott Rakes, LPN   Activities of Daily Living In your present state of health, do you have any difficulty performing  the following activities: 05/23/2021  Hearing? Y  Comment pt wears hearing aids  Vision? N  Difficulty concentrating or making decisions? Y  Comment memory at [employer] or climbing stairs? N  Dressing or bathing? N  Doing errands, shopping? N  Preparing Food and eating ? N  Using the Toilet? N  In the past six months, have you accidently leaked urine? Y  Comment taking meds  Do you have problems with loss of bowel control? N  Managing your Medications? N  Managing your Finances? N  Housekeeping or managing your Housekeeping? N  Some recent data might be hidden    Patient Care Team: Hali Marry, MD as PCP - General (Family Medicine) Ellis Parents, MD as Attending Physician (Cardiology) Darius Bump, [facility] as Pharmacist (Pharmacist)  Indicate any recent Medical Services you may have received from other than [facility] providers in the past year (date may be approximate).     Assessment:   This is a routine wellness examination for [place].  Hearing/Vision screen Hearing Screening - Comments:: Pt wears hearing aids Vision Screening - Comments:: Pt follows up with dr weaver for annual eye exams   Dietary issues and exercise activities discussed: Current Exercise Habits: The patient does not participate in regular exercise at present   Goals Addressed             This Visit's Progress    Patient Stated       Get back to exercising        Depression Screen PHQ 2/9 Scores 05/23/2021 12/25/2020 08/26/2020 04/26/2019 02/14/2019 10/02/2018 06/13/2018  PHQ - 2 Score 0 0 0 0 0 0 0  PHQ- 9 Score - - - - - - -    Fall Risk Fall Risk  05/23/2021 08/26/2020 04/26/2019 02/14/2019 10/02/2018  Falls in the past year? 0 0 0 0 1  Number falls in past yr: 0 - 0 - 0  Injury with Fall? 0 - 0 - 0  Risk for fall due to : Impaired vision No Fall Risks - - -  Follow up Falls prevention discussed - - - Falls  prevention discussed    FALL RISK PREVENTION PERTAINING TO THE HOME:  Any  stairs in or around the home? Yes  If so, are there any without handrails? No  Home free of loose throw rugs in walkways, pet beds, electrical cords, etc? Yes  Adequate lighting in your home to reduce risk of falls? Yes   ASSISTIVE DEVICES UTILIZED TO PREVENT FALLS:  Life alert? No  Use of a cane, walker or w/c? No  Grab bars in the bathroom? Yes  Shower chair or bench in shower? Yes  Elevated toilet seat or a handicapped toilet? No   TIMED UP AND GO:  Was the test performed? No .   Cognitive Function: MMSE - Mini Mental State Exam 01/22/2021  Orientation to time 5  Orientation to Place 5  Registration 3  Attention/ Calculation 5  Recall 3  Language- name 2 objects 2  Language- repeat 1  Language- follow 3 step command 3  Language- read & follow direction 1  Write a sentence 1  Copy design 1  Total score 30     6CIT Screen 05/23/2021 10/02/2018  What Year? 0 points 0 points  What month? 0 points 0 points  What time? 0 points 0 points  Count back from 20 0 points 0 points  Months in reverse 0 points 0 points  Repeat phrase 0 points 0 points  Total Score 0 0    Immunizations Immunization History  Administered Date(s) Administered   Fluad Quad(high Dose 65+) 05/23/2019, 06/20/2020   Influenza Split 05/19/2012   Influenza Whole 05/05/2011   Influenza, High Dose Seasonal PF 05/30/2017, 06/13/2018   Influenza,inj,Quad PF,6+ Mos 07/27/2013, 07/08/2014, 06/21/2015, 06/02/2016   Influenza-Unspecified 07/06/2015   PFIZER(Purple Top)SARS-COV-2 Vaccination 11/04/2019, 11/28/2019, 07/27/2020, 12/09/2020   Pneumococcal Conjugate-13 07/24/2014   Pneumococcal Polysaccharide-23 07/12/2010   Tdap 08/19/2011   Zoster, Live 06/11/2008    TDAP status: Up to date  Flu Vaccine status: Due, Education has been provided regarding the importance of this vaccine. Advised may receive this vaccine at local pharmacy or Health Dept. Aware to provide a copy of the vaccination record if  obtained from local pharmacy or Health Dept. Verbalized acceptance and understanding.  Pneumococcal vaccine status: Up to date  Covid-19 vaccine status: Completed vaccines  Qualifies for Shingles Vaccine? Yes   Zostavax completed Yes   Shingrix Completed?: No.    Education has been provided regarding the importance of this vaccine. Patient has been advised to call insurance company to determine out of pocket expense if they have not yet received this vaccine. Advised may also receive vaccine at local pharmacy or Health Dept. Verbalized acceptance and understanding.  Screening Tests Health Maintenance  Topic Date Due   Zoster Vaccines- Shingrix (1 of 2) Never done   COLONOSCOPY (Pts 45-18yr Insurance coverage will need to be confirmed)  06/24/2016   OPHTHALMOLOGY EXAM  09/28/2019   INFLUENZA VACCINE  04/06/2021   COVID-19 Vaccine (5 - Booster for Pfizer series) 04/10/2021   HEMOGLOBIN A1C  06/26/2021   TETANUS/TDAP  08/18/2021   FOOT EXAM  08/26/2021   URINE MICROALBUMIN  12/25/2021   Hepatitis C Screening  Completed   HPV VACCINES  Aged Out    Health Maintenance  Health Maintenance Due  Topic Date Due   Zoster Vaccines- Shingrix (1 of 2) Never done   COLONOSCOPY (Pts 45-446yrInsurance coverage will need to be confirmed)  06/24/2016   OPHTHALMOLOGY EXAM  09/28/2019   INFLUENZA VACCINE  04/06/2021   COVID-19  Vaccine (5 - Booster for Pfizer series) 04/10/2021    Colorectal cancer screening: Referral to GI placed 05/22/21. Pt aware the office will call re: appt.   Additional Screening:  Hepatitis C Screening:  Completed 01/06/15  Vision Screening: Recommended annual ophthalmology exams for early detection of glaucoma and other disorders of the eye. Is the patient up to date with their annual eye exam?  Yes  Who is the provider or what is the name of the office in which the patient attends annual eye exams? Dr Kathlen Mody  If pt is not established with a provider, would they like  to be referred to a provider to establish care? No .   Dental Screening: Recommended annual dental exams for proper oral hygiene  Community Resource Referral / Chronic Care Management: CRR required this visit?  No   CCM required this visit?  No      Plan:     I have personally reviewed and noted the following in the patient's chart:   Medical and social history Use of alcohol, tobacco or illicit drugs  Current medications and supplements including opioid prescriptions. Patient is not currently taking opioid prescriptions. Functional ability and status Nutritional status Physical activity Advanced directives List of other physicians Hospitalizations, surgeries, and ER visits in previous 12 months Vitals Screenings to include cognitive, depression, and falls Referrals and appointments  In addition, I have reviewed and discussed with patient certain preventive protocols, quality metrics, and best practice recommendations. A written personalized care plan for preventive services as well as general preventive health recommendations were provided to patient.     Willette Brace, LPN   [id number]   Nurse Notes: none

## 2021-05-23 NOTE — Patient Instructions (Addendum)
Todd Parsons , Thank you for taking time to come for your Medicare Wellness Visit. I appreciate your ongoing commitment to your health goals. Please review the following plan we discussed and let me know if I can assist you in the future.   Screening recommendations/referrals: Colonoscopy: Ordered 05/23/21 Recommended yearly ophthalmology/optometry visit for glaucoma screening and checkup Recommended yearly dental visit for hygiene and checkup  Vaccinations: Influenza vaccine: Due Pneumococcal vaccine: Completed  Tdap vaccine: Done 08/19/11 repeat every 10 years due 08/18/21 Shingles vaccine: Shingrix discussed. Please contact your pharmacy for coverage information.    Covid-19: Completed 2/28, 3/24, 07/27/20  Advanced directives: Please bring a copy of your health care power of attorney and living will to the office at your convenience.   Conditions/risks identified: get back to exercise  Next appointment: Follow up in one year for your annual wellness visit.   Preventive Care 43 Years and Older, Male Preventive care refers to lifestyle choices and visits with your health care provider that can promote health and wellness. What does preventive care include? A yearly physical exam. This is also called an annual well check. Dental exams once or twice a year. Routine eye exams. Ask your health care provider how often you should have your eyes checked. Personal lifestyle choices, including: Daily care of your teeth and gums. Regular physical activity. Eating a healthy diet. Avoiding tobacco and drug use. Limiting alcohol use. Practicing safe sex. Taking low doses of aspirin every day. Taking vitamin and mineral supplements as recommended by your health care provider. What happens during an annual well check? The services and screenings done by your health care provider during your annual well check will depend on your age, overall health, lifestyle risk factors, and family history of  disease. Counseling  Your health care provider may ask you questions about your: Alcohol use. Tobacco use. Drug use. Emotional well-being. Home and relationship well-being. Sexual activity. Eating habits. History of falls. Memory and ability to understand (cognition). Work and work Statistician. Screening  You may have the following tests or measurements: Height, weight, and BMI. Blood pressure. Lipid and cholesterol levels. These may be checked every 5 years, or more frequently if you are over 19 years old. Skin check. Lung cancer screening. You may have this screening every year starting at age 12 if you have a 30-pack-year history of smoking and currently smoke or have quit within the past 15 years. Fecal occult blood test (FOBT) of the stool. You may have this test every year starting at age 48. Flexible sigmoidoscopy or colonoscopy. You may have a sigmoidoscopy every 5 years or a colonoscopy every 10 years starting at age 78. Prostate cancer screening. Recommendations will vary depending on your family history and other risks. Hepatitis C blood test. Hepatitis B blood test. Sexually transmitted disease (STD) testing. Diabetes screening. This is done by checking your blood sugar (glucose) after you have not eaten for a while (fasting). You may have this done every 1-3 years. Abdominal aortic aneurysm (AAA) screening. You may need this if you are a current or former smoker. Osteoporosis. You may be screened starting at age 66 if you are at high risk. Talk with your health care provider about your test results, treatment options, and if necessary, the need for more tests. Vaccines  Your health care provider may recommend certain vaccines, such as: Influenza vaccine. This is recommended every year. Tetanus, diphtheria, and acellular pertussis (Tdap, Td) vaccine. You may need a Td booster every 10 years.  Zoster vaccine. You may need this after age 60. Pneumococcal 13-valent  conjugate (PCV13) vaccine. One dose is recommended after age 65. Pneumococcal polysaccharide (PPSV23) vaccine. One dose is recommended after age 97. Talk to your health care provider about which screenings and vaccines you need and how often you need them. This information is not intended to replace advice given to you by your health care provider. Make sure you discuss any questions you have with your health care provider. Document Released: 09/19/2015 Document Revised: 05/12/2016 Document Reviewed: 06/24/2015 Elsevier Interactive Patient Education  2017 [place] Prevention in the Home Falls can cause injuries. They can happen to people of all ages. There are many things you can do to make your home safe and to help prevent falls. What can I do on the outside of my home? Regularly fix the edges of walkways and driveways and fix any cracks. Remove anything that might make you trip as you walk through a door, such as a raised step or threshold. Trim any bushes or trees on the path to your home. Use bright outdoor lighting. Clear any walking paths of anything that might make someone trip, such as rocks or tools. Regularly check to see if handrails are loose or broken. Make sure that both sides of any steps have handrails. Any raised decks and porches should have guardrails on the edges. Have any leaves, snow, or ice cleared regularly. Use sand or salt on walking paths during winter. Clean up any spills in your garage right away. This includes oil or grease spills. What can I do in the bathroom? Use night lights. Install grab bars by the toilet and in the tub and shower. Do not use towel bars as grab bars. Use non-skid mats or decals in the tub or shower. If you need to sit down in the shower, use a plastic, non-slip stool. Keep the floor dry. Clean up any water that spills on the floor as soon as it happens. Remove soap buildup in the tub or shower regularly. Attach bath mats  securely with double-sided non-slip rug tape. Do not have throw rugs and other things on the floor that can make you trip. What can I do in the bedroom? Use night lights. Make sure that you have a light by your bed that is easy to reach. Do not use any sheets or blankets that are too big for your bed. They should not hang down onto the floor. Have a firm chair that has side arms. You can use this for support while you get dressed. Do not have throw rugs and other things on the floor that can make you trip. What can I do in the kitchen? Clean up any spills right away. Avoid walking on wet floors. Keep items that you use a lot in easy-to-reach places. If you need to reach something above you, use a strong step stool that has a grab bar. Keep electrical cords out of the way. Do not use floor polish or wax that makes floors slippery. If you must use wax, use non-skid floor wax. Do not have throw rugs and other things on the floor that can make you trip. What can I do with my stairs? Do not leave any items on the stairs. Make sure that there are handrails on both sides of the stairs and use them. Fix handrails that are broken or loose. Make sure that handrails are as long as the stairways. Check any carpeting to make sure that  it is firmly attached to the stairs. Fix any carpet that is loose or worn. Avoid having throw rugs at the top or bottom of the stairs. If you do have throw rugs, attach them to the floor with carpet tape. Make sure that you have a light switch at the top of the stairs and the bottom of the stairs. If you do not have them, ask someone to add them for you. What else can I do to help prevent falls? Wear shoes that: Do not have high heels. Have rubber bottoms. Are comfortable and fit you well. Are closed at the toe. Do not wear sandals. If you use a stepladder: Make sure that it is fully opened. Do not climb a closed stepladder. Make sure that both sides of the stepladder  are locked into place. Ask someone to hold it for you, if possible. Clearly mark and make sure that you can see: Any grab bars or handrails. First and last steps. Where the edge of each step is. Use tools that help you move around (mobility aids) if they are needed. These include: Canes. Walkers. Scooters. Crutches. Turn on the lights when you go into a dark area. Replace any light bulbs as soon as they burn out. Set up your furniture so you have a clear path. Avoid moving your furniture around. If any of your floors are uneven, fix them. If there are any pets around you, be aware of where they are. Review your medicines with your doctor. Some medicines can make you feel dizzy. This can increase your chance of falling. Ask your doctor what other things that you can do to help prevent falls. This information is not intended to replace advice given to you by your health care provider. Make sure you discuss any questions you have with your health care provider. Document Released: 06/19/2009 Document Revised: 01/29/2016 Document Reviewed: 09/27/2014 Elsevier Interactive Patient Education  2017 [employer].

## 2021-05-25 ENCOUNTER — Encounter: Payer: Self-pay | Admitting: Internal Medicine

## 2021-06-17 ENCOUNTER — Ambulatory Visit (INDEPENDENT_AMBULATORY_CARE_PROVIDER_SITE_OTHER): Payer: Medicare Other | Admitting: Pulmonary Disease

## 2021-06-17 ENCOUNTER — Other Ambulatory Visit: Payer: Self-pay

## 2021-06-17 ENCOUNTER — Encounter: Payer: Self-pay | Admitting: Pulmonary Disease

## 2021-06-17 VITALS — BP 116/62 | HR 74 | Temp 98.3°F | Ht 67.0 in | Wt 172.0 lb

## 2021-06-17 DIAGNOSIS — J849 Interstitial pulmonary disease, unspecified: Secondary | ICD-10-CM

## 2021-06-17 LAB — PULMONARY FUNCTION TEST
FEF 25-75 Post: 2.63 L/sec
FEF 25-75 Pre: 2.95 L/sec
FEF2575-%Change-Post: -10 %
FEF2575-%Pred-Post: 138 %
FEF2575-%Pred-Pre: 155 %
FEV1-%Change-Post: 6 %
FEV1-%Pred-Post: 55 %
FEV1-%Pred-Pre: 51 %
FEV1-Post: 1.47 L
FEV1-Pre: 1.38 L
FEV1FVC-%Change-Post: 1 %
FEV1FVC-%Pred-Pre: 128 %
FEV6-%Change-Post: 4 %
FEV6-%Pred-Post: 44 %
FEV6-%Pred-Pre: 42 %
FEV6-Post: 1.55 L
FEV6-Pre: 1.48 L
FEV6FVC-%Pred-Post: 107 %
FEV6FVC-%Pred-Pre: 107 %
FVC-%Change-Post: 4 %
FVC-%Pred-Post: 41 %
FVC-%Pred-Pre: 39 %
FVC-Post: 1.55 L
FVC-Pre: 1.48 L
Post FEV1/FVC ratio: 95 %
Post FEV6/FVC ratio: 100 %
Pre FEV1/FVC ratio: 93 %
Pre FEV6/FVC Ratio: 100 %
RV % pred: 48 %
RV: 1.18 L
TLC % pred: 50 %
TLC: 3.23 L

## 2021-06-17 NOTE — Progress Notes (Signed)
Spirometry pre/post and lung volumes performed today. Patient was not able to perform DLCO due to cough with inhalation and during hold time.

## 2021-06-17 NOTE — Patient Instructions (Signed)
Spirometry pre/post and lung volumes performed today.

## 2021-06-17 NOTE — Patient Instructions (Signed)
We will check comprehensive metabolic panel today Start paperwork for [facility] Referral for pulmonary rehab in [place] Follow-up in 1 to 2 months

## 2021-06-17 NOTE — Progress Notes (Addendum)
Todd Parsons    [id number]    March 13, 1946  Primary Care Physician:Metheney, Rene Kocher, MD  Referring Physician: Hali Marry, MD [address] [place],  [place] [address]  Chief complaint: Follow-up for ILD  HPI: 75 year old ex-smoker with history of a flutter, aortic valve replacement, aortic aneurysm repair, hyperlipidemia, diabetes  Referred for evaluation of pulmonary fibrosis He was previously seen in 2018 by Dr. Elsworth Soho in the pulmonary clinic when he had a CT scan which showed pulmonary fibrosis.  He has been lost to follow-up since then. Complains of chronic dyspnea on exertion which has been stable over several years.  No symptoms at rest.  Denies any cough, sputum production, fevers, chills  Pets: Has a dog Occupation: Retired Advice worker for [employer] Exposures: No known exposures.  No mold, hot tub, Jacuzzi.  No feather pillows or comforter ILD questionnaire 06/17/2021-negative for exposures. Smoking history: 17-pack-year smoker.  Quit in 1979 Travel history: Originally from [place].  No significant recent travel Relevant family history: No family history of lung disease  Interim history: Here for review of CT and plan for next steps States that he continues to have dyspnea on exertion which is unchanged.  Outpatient Encounter Medications as of 06/17/2021  Medication Sig   aspirin EC 81 MG tablet Take 81 mg by mouth daily.   atorvastatin (LIPITOR) 40 MG tablet TAKE 1 TABLET BY MOUTH AT  BEDTIME   cetirizine (ZYRTEC) 10 MG tablet Take 10 mg by mouth as needed for allergies.   fenofibrate 160 MG tablet TAKE 1 TABLET BY MOUTH  DAILY   finasteride (PROSCAR) 5 MG tablet TAKE 1 TABLET BY MOUTH  DAILY   FLUoxetine (PROZAC) 20 MG capsule TAKE 1 CAPSULE BY MOUTH  DAILY   fluticasone (FLONASE) 50 MCG/ACT nasal spray Place 2 sprays into both nostrils daily as needed for allergies or rhinitis.   metFORMIN (GLUCOPHAGE-XR) 500 MG 24 hr  tablet Take 1 tablet (500 mg total) by mouth daily with breakfast.   metoprolol succinate (TOPROL-XL) 50 MG 24 hr tablet Take 1 tablet (50 mg total) by mouth daily. Take with or immediately following a meal.   oxybutynin (DITROPAN) 5 MG tablet Take 1 tablet (5 mg total) by mouth 2 (two) times daily.   PFIZER-BIONT COVID-19 VAC-TRIS SUSP injection    tamsulosin (FLOMAX) 0.4 MG CAPS capsule Take 1 capsule (0.4 mg total) by mouth daily after supper.   No facility-administered encounter medications on file as of 06/17/2021.    Allergies as of 06/17/2021   (No Known Allergies)    Physical Exam: Blood pressure 116/62, pulse 74, temperature 98.3 F (36.8 C), temperature source Oral, height 5\' 7"  (1.702 m), weight 172 lb (78 kg), SpO2 94 %. Gen:      No acute distress HEENT:  EOMI, sclera anicteric Neck:     No masses; no thyromegaly Lungs:   Bibasal crackles CV:         Regular rate and rhythm; no murmurs Abd:      + bowel sounds; soft, non-tender; no palpable masses, no distension Ext:    No edema; adequate peripheral perfusion Skin:      Warm and dry; no rash Neuro: alert and oriented x 3 Psych: normal mood and affect   Data Reviewed: Imaging: High-res CT 07/22/2017-pulmonary fibrosis, probable UIP High-res CT 09/09/2020-slight worsening of pulmonary fibrosis and probable UIP pattern I have reviewed the images personally.  PFTs: 08/24/2017 FVC  2.02 [50%], FEV1 1.89 [69%], F/F 94, TLC 3.62 [56%], DLCO 15.57 [5%] Severe restriction and diffusion defect  Labs: CTD, hypersensitivity serologies 08/24/2017-negative ANA, CCP, negative hypersensitivity panel  Assessment:  Pulmonary fibrosis He had CT scans on record which shows pulmonary fibrosis slightly progressive.  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  There may be a hint of honeycombing at the base as well by my review.  Note CT reports from even before from the [place] that shows reticulation so the process appears to be  progressive  Although the initial scan was read as hypersensitivity pneumonitis he does not have any known exposures or signs and symptoms of connective tissue disease.  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  He prefers to use the [place] rehab specialists as it is closer to home  Plan/Recommendations: [facility]  Marshell Garfinkel MD [facility] 06/17/2021, 2:51 PM  CC: Hali Marry, *

## 2021-06-18 ENCOUNTER — Telehealth: Payer: Self-pay | Admitting: Pharmacy Technician

## 2021-06-18 ENCOUNTER — Telehealth: Payer: Self-pay | Admitting: Pulmonary Disease

## 2021-06-18 DIAGNOSIS — J849 Interstitial pulmonary disease, unspecified: Secondary | ICD-10-CM

## 2021-06-18 LAB — COMPREHENSIVE METABOLIC PANEL
ALT: 17 U/L (ref 0–53)
AST: 26 U/L (ref 0–37)
Albumin: 4.1 g/dL (ref 3.5–5.2)
Alkaline Phosphatase: 51 U/L (ref 39–117)
BUN: 16 mg/dL (ref 6–23)
CO2: 31 mEq/L (ref 19–32)
Calcium: 9.7 mg/dL (ref 8.4–10.5)
Chloride: 102 mEq/L (ref 96–112)
Creatinine, Ser: 1.06 mg/dL (ref 0.40–1.50)
GFR: 68.3 mL/min (ref >60.00)
Glucose, Bld: 150 mg/dL — ABNORMAL HIGH (ref 70–99)
Potassium: 4.3 mEq/L (ref 3.5–5.1)
Sodium: 138 mEq/L (ref 135–145)
Total Bilirubin: 0.7 mg/dL (ref 0.2–1.2)
Total Protein: 6.6 g/dL (ref 6.0–8.3)

## 2021-06-18 NOTE — Telephone Encounter (Signed)
[place] forms. Awaiting response.  Phone# [phone]

## 2021-06-18 NOTE — Telephone Encounter (Signed)
Patient was seen by Dr Vaughan Browner yesterday and order was placed for Pulm Rehab to be done at [facility] and order gave phone # and fax # for them .  I called today and spoke to [place] and she states they do not do Pulmonary Rehab.  Will need a new order for wherever patient needs to go.

## 2021-06-18 NOTE — Telephone Encounter (Signed)
Received New start paperwork for ESBRIET. Will update as we work through the benefits process.  

## 2021-06-18 NOTE — Telephone Encounter (Addendum)
Received denial from [employer] as Esbriet/Pirfenidone is a Plan Exclusion.  Pirfenidone is a benefit exclusion under your Medicare Advantage (MA) plan. Drug coverage provided by your plan is limited only to those drugs covered under your medical benefit. Please refer to your Evidence of Coverage (EOC) For further information. Your Medicare Advantage (MA) plan does not cover outpatient prescription drugs.  Phone# [phone]

## 2021-06-19 NOTE — Telephone Encounter (Signed)
ATC LVMTCB r/t where pt would prefer to do pulmonary rehab since [facility] does not offer pulmonary rehab.

## 2021-06-22 ENCOUNTER — Telehealth: Payer: Self-pay

## 2021-06-22 NOTE — Telephone Encounter (Signed)
Direct colonoscopy is ok

## 2021-06-22 NOTE — Telephone Encounter (Signed)
Dr. Carlean Purl,  This is a 75 year old patient who has been scheduled for a PV on 07/01/2021 with a direct colon scheduled on 07/17/2021.  This patient's last colon was in [place] about 10 years ago per the Epic charting- please review records and advise if patient needs an OV or can proceed with procedure and PV as scheduled Thank you

## 2021-06-22 NOTE — Telephone Encounter (Signed)
Noted on PV chart ?

## 2021-06-22 NOTE — Telephone Encounter (Signed)
I spoke with the pt and let him know that [facility] does not offer pulmonary rehab. He now wants to go to either [place] or [place] for this. Will forward to [facility] to let them know. Please let pt know if either of these places can offer rehab. Thanks!

## 2021-06-23 NOTE — Telephone Encounter (Signed)
Patient states would like to do pulmonary rehab in [place]. States they are able to see him. Patient phone number is [phone].

## 2021-06-23 NOTE — Telephone Encounter (Signed)
Called and spoke with pt and he stated that he spoke with [facility] and they will take him for rehab.  He stated that we just needed to send the order to them.  Will route back to PCC's to have the order faxed.  thanks

## 2021-06-24 NOTE — Telephone Encounter (Signed)
Refaxed denial and insurance cards to [place].

## 2021-06-24 NOTE — Telephone Encounter (Signed)
PM please advise if you are ok with the pt going to pulm rehab in [place] have no way of monitoring someone that needs to be or needs to be on O2.  See message below.  I spoke to the pt & he states he spoke to [place] at [facility] and they told him they do pulmonary rehab but not if pt has heart issues & he states he doesn't.  I called [facility] & spoke to [facility].  She states they do not take pt's that have heart issues that need to be monitored or need to be on O2- they don't have a way to do that.  They can do lung strengthening or work with deconditioning.  Will route back to triage to check with Dr Vaughan Browner to verify if this is ok.

## 2021-06-24 NOTE — Telephone Encounter (Signed)
I spoke to the Todd Parsons & he states he spoke to [place] at [facility] and they told him they do pulmonary rehab but not if Todd Parsons has heart issues & he states he doesn't.  I called [facility] & spoke to [facility].  She states they do not take Todd Parsons's that have heart issues that need to be monitored or need to be on O2- they don't have a way to do that.  They can do lung strengthening or work with deconditioning.  Will route back to triage to check with Dr Vaughan Browner to verify if this is ok.

## 2021-06-26 NOTE — Telephone Encounter (Signed)
Ok. He needs to be monitored for O2 sats. If they cannot do it then please refer to either [facility] or  rehab as per patient preference. Thanks

## 2021-06-26 NOTE — Telephone Encounter (Signed)
Order has been sent to [facility] they will contact the patient

## 2021-06-26 NOTE — Telephone Encounter (Signed)
Called and spoke with pt about info stated from Dr. Vaughan Browner about him needing to do pulm rehab at either [facility] or [facility] due to needing to have O2 sats monitored and [facility] would not be able to do that.   After speaking with pt, pt said to refer him to pulm rehab at [facility].   Order placed for [facility] to have [facility]. PCCs, is there any way to cancel/close the original pulm rehab order that was placed on 10/12?

## 2021-06-30 ENCOUNTER — Encounter (HOSPITAL_COMMUNITY): Payer: Self-pay | Admitting: *Deleted

## 2021-06-30 NOTE — Progress Notes (Signed)
Received referral from Dr. Vaughan Browner for this pt to participate in pulmonary rehab with the diagnosis of ILD. Clinical review of pt follow up appt on 10/12 Pulmonary office note.  Pt with Covid Risk Score - 7. Pt appropriate for scheduling for Pulmonary rehab.  Will forward to support staff for scheduling when able as there is a wait list and verification of insurance eligibility/benefits with pt consent. Cherre Huger, BSN Cardiac and Training and development officer

## 2021-07-01 ENCOUNTER — Ambulatory Visit (AMBULATORY_SURGERY_CENTER): Payer: Self-pay | Admitting: *Deleted

## 2021-07-01 ENCOUNTER — Other Ambulatory Visit: Payer: Self-pay

## 2021-07-01 ENCOUNTER — Encounter: Payer: Self-pay | Admitting: *Deleted

## 2021-07-01 VITALS — Ht 67.0 in | Wt 170.0 lb

## 2021-07-01 DIAGNOSIS — Z1211 Encounter for screening for malignant neoplasm of colon: Secondary | ICD-10-CM

## 2021-07-01 NOTE — Telephone Encounter (Signed)
Received a verbal confirmation from  [place] rep regarding an approval for [place] patient assistance from 07/01/21 with no end date. Patient will remain enrolled as long as there are no insurance coverage changes, change in income, or change in provider then the patient will remain in enrolled.  [employer] phone number: [phone] [facility]: [phone]  Awaiting fax confirmation letter to be sent from [place].  Knox Saliva, PharmD, MPH, BCPS Clinical Pharmacist (Rheumatology and Pulmonology)

## 2021-07-01 NOTE — Progress Notes (Signed)
Virtual pre visit completed in person with patient and his wife.    No egg or soy allergy known to patient  No issues known to pt with past sedation with any surgeries or procedures Patient denies ever being told they had issues or difficulty with intubation  No FH of Malignant Hyperthermia Pt is not on diet pills Pt is not on  home 02  Pt is not on blood thinners  Pt denies issues with constipation  No A fib or A flutter  Pt is fully vaccinated  for Covid    Due to the COVID-19 pandemic we are asking patients to follow certain guidelines in PV and the [place]   Pt aware of COVID protocols and [facility] guidelines

## 2021-07-01 NOTE — Telephone Encounter (Signed)
Resubmitted a Prior Authorization request to [facility] for ESBRIET via CoverMyMeds, selecting 2023 rather than 2022. PA questions would not populate while 2022 was selected, but DID when 2023 was chosen. Will update once we receive a response.   (KeyTheda [place]) - [id number]

## 2021-07-02 ENCOUNTER — Ambulatory Visit (INDEPENDENT_AMBULATORY_CARE_PROVIDER_SITE_OTHER): Payer: Medicare Other | Admitting: Pharmacist

## 2021-07-02 DIAGNOSIS — E1159 Type 2 diabetes mellitus with other circulatory complications: Secondary | ICD-10-CM

## 2021-07-02 DIAGNOSIS — E785 Hyperlipidemia, unspecified: Secondary | ICD-10-CM

## 2021-07-02 DIAGNOSIS — I10 Essential (primary) hypertension: Secondary | ICD-10-CM

## 2021-07-02 NOTE — Telephone Encounter (Signed)
ATC #1 patient to discuss Esbriet approval. Unable to reach. Left VM requesting return call to direct pharmacy office phone number  Knox Saliva, PharmD, MPH, BCPS Clinical Pharmacist (Rheumatology and Pulmonology)

## 2021-07-02 NOTE — Progress Notes (Signed)
Chronic Care Management Pharmacy Note  07/03/2021 Name:  Todd Parsons MRN:  [id number] DOB:  Sep 01, 1946  Summary: addressed DM, HTN, HLD. Patient states his cardiologist retired and might need new referral for cardiologist, he is unsure. Starting w/pulmonary rehab soon and looking forward to it.  Recommendations/Changes made from today's visit: Recommend consider adjustment of metoprolol tartrate to succinate in order for better duration of activity to support a once daily dosing strategy. --> He recently transitioned all RX to the [facility] [place] so because metoprolol was sent to mail order, he cancelled it/did not implement this change from last time. Patient is agreeable to implementing this change, requesting RX to the [place] pharmacy.  Plan: f/u with pharmacist in 6 months  Subjective: Todd Parsons is an 75 y.o. year old male who is a primary patient of Metheney, Rene Kocher, MD.  The CCM team was consulted for assistance with disease management and care coordination needs.    Engaged with patient by telephone for follow up visit in response to provider referral for pharmacy case management and/or care coordination services.   Consent to Services:  The patient was given information about Chronic Care Management services, agreed to services, and gave verbal consent prior to initiation of services.  Please see initial visit note for detailed documentation.   Patient Care Team: Hali Marry, MD as PCP - General (Family Medicine) Ellis Parents, MD as Attending Physician (Cardiology) Darius Bump, [facility] as Pharmacist (Pharmacist)  Objective:  Lab Results  Component Value Date   CREATININE 1.06 06/17/2021   CREATININE 1.07 12/25/2020   CREATININE 1.08 09/09/2020    Lab Results  Component Value Date   HGBA1C 6.0 (H) 12/25/2020       Component Value Date/Time   CHOL 118 12/31/2019 1101   TRIG 126 12/31/2019 1101   HDL 34 (L) 12/31/2019 1101   CHOLHDL 5.4 (H) 10/16/2018  1159   VLDL 29 04/05/2016 0833   LDLCALC 61 12/31/2019 1101   LDLCALC 65 10/16/2018 1159    Hepatic Function Latest Ref Rng & Units 06/17/2021 12/25/2020 09/09/2020  Total Protein 6.0 - 8.3 g/dL 6.6 7.0 7.0  Albumin 3.5 - 5.2 g/dL 4.1 4.4 4.3  AST 0 - 37 U/L 26 24 28   ALT 0 - 53 U/L 17 17 22   Alk Phosphatase 39 - 117 U/L 51 64 68  Total Bilirubin 0.2 - 1.2 mg/dL 0.7 0.7 0.9  Bilirubin, Direct 0.0 - 0.2 mg/dL - - -    Lab Results  Component Value Date/Time   TSH 1.470 09/09/2020 02:18 PM   TSH 3.31 04/05/2016 08:33 AM    CBC Latest Ref Rng & Units 12/25/2020 09/30/2017 03/02/2017  WBC 3.4 - 10.8 x10E3/uL 8.7 7.9 18.7(H)  Hemoglobin 13.0 - 17.7 g/dL 16.3 14.8 10.7(L)  Hematocrit 37.5 - 51.0 % 48.7 42.7 32.6(L)  Platelets 150 - 450 x10E3/uL 243 225 150    Social History   Tobacco Use  Smoking Status Former   Types: Cigarettes   Quit date: 04/24/1978   Years since quitting: 43.2  Smokeless Tobacco Never   BP Readings from Last 3 Encounters:  06/17/21 116/62  04/02/21 138/78  01/22/21 128/71   Pulse Readings from Last 3 Encounters:  06/17/21 74  04/02/21 (!) 57  01/22/21 (!) 55   Wt Readings from Last 3 Encounters:  07/01/21 170 lb (77.1 kg)  06/17/21 172 lb (78 kg)  04/02/21 171 lb 12.8 oz (77.9 kg)    Assessment: Review of  patient past medical history, allergies, medications, health status, including review of consultants reports, laboratory and other test data, was performed as part of comprehensive evaluation and provision of chronic care management services.   SDOH:  (Social Determinants of Health) assessments and interventions performed:    CCM Care Plan  No Known Allergies  Medications Reviewed Today     Reviewed by Darius Bump, [facility] (Pharmacist) on 07/02/21 at 1145  Med List Status: <None>   Medication Order Taking? Sig Documenting Provider Last Dose Status Informant  aspirin EC 81 MG tablet [id number] Yes Take 81 mg by mouth daily. [provider] Taking Active Self  atorvastatin (LIPITOR) 40 MG tablet [id number] Yes TAKE 1 TABLET BY MOUTH AT  BEDTIME Hali Marry, MD Taking Active   cetirizine (ZYRTEC) 10 MG tablet [id number] Yes Take 10 mg by mouth as needed for allergies. [provider] Taking Active   fenofibrate 160 MG tablet [id number] Yes TAKE 1 TABLET BY MOUTH  DAILY Hali Marry, MD Taking Active   finasteride (PROSCAR) 5 MG tablet [id number] Yes TAKE 1 TABLET BY MOUTH  DAILY Hali Marry, MD Taking Active   FLUoxetine (PROZAC) 20 MG capsule [id number] Yes TAKE 1 CAPSULE BY MOUTH  DAILY Hali Marry, MD Taking Active   fluticasone [facility]) 50 MCG/ACT nasal spray [id number] Yes Place 2 sprays into both nostrils daily as needed for allergies or rhinitis. Hali Marry, MD Taking Active   metFORMIN (GLUCOPHAGE-XR) 500 MG 24 hr tablet [id number] Yes Take 1 tablet (500 mg total) by mouth daily with breakfast. Hali Marry, MD Taking Active   metoprolol succinate (TOPROL-XL) 50 MG 24 hr tablet [id number] No Take 1 tablet (50 mg total) by mouth daily. Take with or immediately following a meal.  Patient not taking: Reported on 07/02/2021   Hali Marry, MD Not Taking Active   oxybutynin (DITROPAN) 5 MG tablet [id number] Yes Take 1 tablet (5 mg total) by mouth 2 (two) times daily. Hali Marry, MD Taking Active            Med Note Tonny Bollman Jul 02, 2021 11:42 AM) Taking 1.5 tablets in AM and PM  PFIZER-BIONT COVID-19 VAC-TRIS SUSP injection [id number] Yes  [provider] Taking Active   Polyethylene Glycol 3350 (MIRALAX PO) [id number] Yes Take by mouth. Colonoscopy prep [provider] Taking Active   tamsulosin (FLOMAX) 0.4 MG CAPS capsule [id number] Yes Take 1 capsule (0.4 mg total) by mouth daily after supper. Hali Marry, MD Taking Active             Patient Active Problem List   Diagnosis Date Noted    Weight loss 08/26/2020   Unspecified mood (affective) disorder ([place]) 08/26/2020   Benign prostatic hyperplasia (BPH) with urinary urgency 04/25/2020   Word finding difficulty 04/25/2020   Seborrheic keratoses 09/10/2019   Acute pain of right shoulder 09/10/2019   CAD (coronary artery disease) 04/15/2017   Wears hearing aid 06/02/2016   OAB (overactive bladder) 04/02/2016   Primary osteoarthritis of both knees 02/10/2016   Pulmonary fibrosis, unspecified ([place]) 12/22/2015   H/O aortic valve replacement 09/15/2015   History of atrial flutter 09/15/2015   Type 2 diabetes mellitus with cardiac complication ([place]) 06/30/8526   Restrictive airway disease 08/09/2014   Hx of arthroscopy of knee 06/08/2011   Essential hypertension, benign 05/05/2011   Hyperlipidemia LDL goal <100 05/05/2011   OSA (obstructive sleep apnea) 05/05/2011   Thoracic  aortic aneurysm without rupture 05/05/2011   Congenital insufficiency of aortic valve 05/05/2011   History of bicuspid aortic valve 05/05/2011   Vitamin D deficiency 05/05/2011    Immunization History  Administered Date(s) Administered   Fluad Quad(high Dose 65+) 05/23/2019, 06/20/2020   Influenza Split 05/19/2012   Influenza Whole 05/05/2011   Influenza, High Dose Seasonal PF 05/30/2017, 06/13/2018   Influenza,inj,Quad PF,6+ Mos 07/27/2013, 07/08/2014, 06/21/2015, 06/02/2016   Influenza-Unspecified 07/06/2015   PFIZER(Purple Top)SARS-COV-2 Vaccination 11/04/2019, 11/28/2019, 07/27/2020, 12/09/2020   Pneumococcal Conjugate-13 07/24/2014   Pneumococcal Polysaccharide-23 07/12/2010   Tdap 08/19/2011   Zoster, Live 06/11/2008    Conditions to be addressed/monitored: HTN, HLD, and DMII  Care Plan : Medication Management  Updates made by Darius Bump, [place] since 07/03/2021 12:00 AM     Problem: HTN, HLD, DM      Long-Range Goal: Disease Progression Prevention   Start Date: 03/10/2021  Recent Progress: On track  Priority: High  Note:    Current Barriers:  None at present Transition of care: recently started getting medications from [place]):  Over the next 1800 days, patient will maintain control of chronic conditions as evidenced by medication fill history, lab values, and vital signs  through collaboration with PharmD and provider.   Interventions: 1:1 collaboration with Hali Marry, MD regarding development and update of comprehensive plan of care as evidenced by provider attestation and co-signature Inter-disciplinary care team collaboration (see longitudinal plan of care) Comprehensive medication review performed; medication list updated in electronic medical record  Diabetes:  Controlled; current treatment:metformin 577m daily; a1c 6 (classified pre-diabetes range)   Current glucose readings: not currently checking  Denies hypoglycemic/hyperglycemic symptoms  Current meal patterns: breakfast: eggs, or cereal; lunch: leftovers; dinner: meat + veggies; snacks: cookies; drinks: water, diet coke, water + powder   Current exercise: does yardwork  Recommended continue current regimen,  Hypertension:  Controlled; current treatment: metoprolol tartrate 576mdaily;   Current home readings: not currently checking  Denies hypotensive/hypertensive symptoms  Recommended consider adjustment of metoprolol tartrate to succinate in order for better duration of activity to support a once daily dosing strategy. Hyperlipidemia:  Controlled; current treatment:atorvastatin 4019m  Recommended continue current regimen  Patient Goals/Self-Care Activities Over the next 180 days, patient will:  take medications as prescribed  Follow Up Plan: Telephone follow up appointment with care management team member scheduled for:  6 months       Medication Assistance: None required.  Patient affirms current coverage meets needs.  Patient's [place] pharmacy is:  [place] [place]one: [phone]: [phone]  ([facility]one: [phone]: [address] [place] [place]one: [phone]: [phone] pill box? Yes Pt endorses 100% compliance  Follow Up:  Patient agrees to Care Plan and Follow-up.  Plan: Telephone follow up appointment with care management team member scheduled for:  6 months  KeeDarius Bump

## 2021-07-03 NOTE — Patient Instructions (Signed)
Visit Information  PATIENT GOALS:  Goals Addressed             This Visit's Progress    Medication Management       Patient Goals/Self-Care Activities Over the next 180 days, patient will:  take medications as prescribed  Follow Up Plan: Telephone follow up appointment with care management team member scheduled for:  6 months         Patient verbalizes understanding of instructions provided today and agrees to view in [place].   Telephone follow up appointment with care management team member scheduled for: 6 months  Darius Bump

## 2021-07-06 DIAGNOSIS — E1159 Type 2 diabetes mellitus with other circulatory complications: Secondary | ICD-10-CM

## 2021-07-06 DIAGNOSIS — I10 Essential (primary) hypertension: Secondary | ICD-10-CM

## 2021-07-06 DIAGNOSIS — E785 Hyperlipidemia, unspecified: Secondary | ICD-10-CM | POA: Diagnosis not present

## 2021-07-07 MED ORDER — METOPROLOL SUCCINATE ER 50 MG PO TB24: 50.0000 mg | ORAL_TABLET | Freq: Every day | ORAL | 1 refills | Status: DC

## 2021-07-07 NOTE — Progress Notes (Signed)
Agree with documentation as above.   Naw Lasala, MD  

## 2021-07-07 NOTE — Addendum Note (Signed)
Addended by: Beatrice Lecher D on: 07/07/2021 01:05 PM   Modules accepted: Orders

## 2021-07-13 ENCOUNTER — Telehealth (HOSPITAL_COMMUNITY): Payer: Self-pay

## 2021-07-13 NOTE — Telephone Encounter (Signed)
Pt called and is interested in the pulmonary rehab program, I advised pt that he has about 30 pt in front of him before we can schedule and that we have a 1-4 month backlog and that we would give him a call back at a later date.

## 2021-07-14 DIAGNOSIS — Z951 Presence of aortocoronary bypass graft: Secondary | ICD-10-CM | POA: Diagnosis not present

## 2021-07-14 DIAGNOSIS — I10 Essential (primary) hypertension: Secondary | ICD-10-CM | POA: Diagnosis not present

## 2021-07-14 DIAGNOSIS — I2510 Atherosclerotic heart disease of native coronary artery without angina pectoris: Secondary | ICD-10-CM | POA: Diagnosis not present

## 2021-07-14 DIAGNOSIS — Z952 Presence of prosthetic heart valve: Secondary | ICD-10-CM | POA: Diagnosis not present

## 2021-07-15 ENCOUNTER — Telehealth: Payer: Self-pay | Admitting: Pharmacist

## 2021-07-15 DIAGNOSIS — J849 Interstitial pulmonary disease, unspecified: Secondary | ICD-10-CM

## 2021-07-15 DIAGNOSIS — Z5181 Encounter for therapeutic drug level monitoring: Secondary | ICD-10-CM

## 2021-07-15 MED ORDER — PIRFENIDONE 267 MG PO TABS: 801.0000 mg | ORAL_TABLET | Freq: Three times a day (TID) | ORAL | 4 refills | Status: DC

## 2021-07-15 MED ORDER — PIRFENIDONE 267 MG PO TABS
ORAL_TABLET | ORAL | 0 refills | Status: DC
Start: 2021-07-15 — End: 2021-08-21

## 2021-07-15 NOTE — Telephone Encounter (Signed)
Counseled patient in separate telephone encounter. Conference called [facility] to complete welcome call and then [facility] to schedule First Esbriet shipment.  Scheduled to be received by patient on 07/22/21

## 2021-07-15 NOTE — Telephone Encounter (Signed)
LMTCB   Appt made for 08/21/21 at 10am. Can reschedule if this does not work for patient.

## 2021-07-15 NOTE — Telephone Encounter (Signed)
Subjective:  Patient called today by [facility] Pulmonary pharmacy team for Esbriet new start.   Patient was last seen by Dr. Vaughan Browner on 06/17/21.  Pertinent past medical history includes IPF, history of aortic valve replacement, aortic aneurysm repair, hyperlipidemia, diabetes. He is naive to antifibrotics  History of elevated LFTs: No History of diarrhea, nausea, vomiting: No  Objective: No Known Allergies  Outpatient Encounter Medications as of 07/15/2021  Medication Sig Note   aspirin EC 81 MG tablet Take 81 mg by mouth daily.    atorvastatin (LIPITOR) 40 MG tablet TAKE 1 TABLET BY MOUTH AT  BEDTIME    cetirizine (ZYRTEC) 10 MG tablet Take 10 mg by mouth as needed for allergies.    fenofibrate 160 MG tablet TAKE 1 TABLET BY MOUTH  DAILY    finasteride (PROSCAR) 5 MG tablet TAKE 1 TABLET BY MOUTH  DAILY    FLUoxetine (PROZAC) 20 MG capsule TAKE 1 CAPSULE BY MOUTH  DAILY    fluticasone (FLONASE) 50 MCG/ACT nasal spray Place 2 sprays into both nostrils daily as needed for allergies or rhinitis.    metFORMIN (GLUCOPHAGE-XR) 500 MG 24 hr tablet Take 1 tablet (500 mg total) by mouth daily with breakfast.    metoprolol succinate (TOPROL-XL) 50 MG 24 hr tablet Take 1 tablet (50 mg total) by mouth daily. Take with or immediately following a meal.    oxybutynin (DITROPAN) 5 MG tablet Take 1 tablet (5 mg total) by mouth 2 (two) times daily. 07/02/2021: Taking 1.5 tablets in AM and PM   PFIZER-BIONT COVID-19 VAC-TRIS SUSP injection     Polyethylene Glycol 3350 (MIRALAX PO) Take by mouth. Colonoscopy prep    tamsulosin (FLOMAX) 0.4 MG CAPS capsule Take 1 capsule (0.4 mg total) by mouth daily after supper.    No facility-administered encounter medications on file as of 07/15/2021.     Immunization History  Administered Date(s) Administered   Fluad Quad(high Dose 65+) 05/23/2019, 06/20/2020   Influenza Split 05/19/2012   Influenza Whole 05/05/2011   Influenza, High Dose Seasonal PF 05/30/2017,  06/13/2018   Influenza,inj,Quad PF,6+ Mos 07/27/2013, 07/08/2014, 06/21/2015, 06/02/2016   Influenza-Unspecified 07/06/2015   PFIZER(Purple Top)SARS-COV-2 Vaccination 11/04/2019, 11/28/2019, 07/27/2020, 12/09/2020   Pneumococcal Conjugate-13 07/24/2014   Pneumococcal Polysaccharide-23 07/12/2010   Tdap 08/19/2011   Zoster, Live 06/11/2008      PFT's TLC  Date Value Ref Range Status  06/17/2021 3.23 L Final      CMP     Component Value Date/Time   NA 138 06/17/2021 1518   NA 140 12/25/2020 1253   K 4.3 06/17/2021 1518   CL 102 06/17/2021 1518   CO2 31 06/17/2021 1518   GLUCOSE 150 (H) 06/17/2021 1518   BUN 16 06/17/2021 1518   BUN 14 12/25/2020 1253   CREATININE 1.06 06/17/2021 1518   CREATININE 1.22 (H) 04/26/2019 1021   CALCIUM 9.7 06/17/2021 1518   PROT 6.6 06/17/2021 1518   PROT 7.0 12/25/2020 1253   ALBUMIN 4.1 06/17/2021 1518   ALBUMIN 4.4 12/25/2020 1253   AST 26 06/17/2021 1518   ALT 17 06/17/2021 1518   ALKPHOS 51 06/17/2021 1518   BILITOT 0.7 06/17/2021 1518   BILITOT 0.7 12/25/2020 1253   GFRNONAA 67 09/09/2020 1418   GFRNONAA 58 (L) 04/26/2019 1021   GFRAA 77 09/09/2020 1418   GFRAA 67 04/26/2019 1021      CBC    Component Value Date/Time   WBC 8.7 12/25/2020 1253   WBC 18.7 (H) 03/02/2017 1194  RBC 5.51 12/25/2020 1253   RBC 3.62 (L) 03/02/2017 0509   HGB 16.3 12/25/2020 1253   HCT 48.7 12/25/2020 1253   PLT 243 12/25/2020 1253   MCV 88 12/25/2020 1253   MCH 29.6 12/25/2020 1253   MCH 29.6 03/02/2017 0509   MCHC 33.5 12/25/2020 1253   MCHC 32.8 03/02/2017 0509   RDW 13.0 12/25/2020 1253   LYMPHSABS 2.5 12/25/2020 1253   MONOABS 0.8 02/18/2017 1200   EOSABS 0.3 12/25/2020 1253   BASOSABS 0.1 12/25/2020 1253      LFT's Hepatic Function Latest Ref Rng & Units 06/17/2021 12/25/2020 09/09/2020  Total Protein 6.0 - 8.3 g/dL 6.6 7.0 7.0  Albumin 3.5 - 5.2 g/dL 4.1 4.4 4.3  AST 0 - 37 U/L 26 24 28   ALT 0 - 53 U/L 17 17 22   Alk Phosphatase  39 - 117 U/L 51 64 68  Total Bilirubin 0.2 - 1.2 mg/dL 0.7 0.7 0.9  Bilirubin, Direct 0.0 - 0.2 mg/dL - - -      HRCT (09/09/20) - Redemonstrated pattern of mild to moderate pulmonary fibrosis in a pattern with slight apical to basal gradient, featuring irregular peripheral interstitial opacity, septal thickening, traction bronchiectasis, areas of subpleural bronchiolectasis, and without clear evidence of honeycombing. 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 Findings are categorized as probable UIP per consensus guidelines:  Assessment and Plan  Esbriet Medication Management Thoroughly counseled patient on the efficacy, mechanism of action, dosing, administration, adverse effects, and monitoring parameters of Esbriet.  Patient verbalized understanding. Patient education handout provided.   Goals of Therapy: Will not stop or reverse the progression of ILD. It will slow the progression of ILD.   Dosing: Starting dose will be Esbriet 267 mg 1 tablet three times daily for 7 days, then 2 tablets three times daily for 7 days, then 3 tablets three times daily.  Maintenance dose will be 801 mg 1 tablet three times daily if tolerated.  Stressed the importance of taking with meals and space at least 5-6 hours apart to minimize stomach upset.   Adverse Effects: Nausea, vomiting, diarrhea, weight loss Abdominal pain GERD Sun sensitivity/rash - patient advised to wear sunscreen when exposed to sunlight Dizziness Fatigue  Monitoring: Monitor for diarrhea, nausea and vomiting, GI perforation, hepatotoxicity  Monitor LFTs - baseline, monthly for first 6 months, then every 3 months routinely CBC w differential at baseline and every 3 months routinely Would prefer to have labs drawn at [facility] in [place]. He has been advised to have labs completed once he finishes  bottle of Esbriet and notify our clinic so we can fax lab orders to this [place]: Approval of Esbriet through: patient assistance Rx sent to: [place] [facility], dispensing) for [place]: [phone]  First shipment scheduled to get to patient on 07/22/21 - order # [id number]  Medication Reconciliation A drug regimen assessment was performed, including review of allergies, interactions, disease-state management, dosing and immunization history. Medications were reviewed with the patient, including name, instructions, indication, goals of therapy, potential side effects, importance of adherence, and safe use.  Immunizations Patient is UTD on influenzae, pneumonia, and shingles vaccinations. Patient has received 4 COVID19 vaccines.  This appointment required 60 minutes of patient care (this includes precharting, chart review, review of results, face-to-face care, etc.).  Thank you for involving pharmacy to assist in providing this patient's care.    Knox Saliva,  PharmD, MPH, BCPS Clinical Pharmacist (Rheumatology and Pulmonology)

## 2021-07-17 ENCOUNTER — Encounter: Payer: Self-pay | Admitting: Internal Medicine

## 2021-07-17 ENCOUNTER — Ambulatory Visit (AMBULATORY_SURGERY_CENTER): Payer: Medicare Other | Admitting: Internal Medicine

## 2021-07-17 ENCOUNTER — Other Ambulatory Visit: Payer: Self-pay

## 2021-07-17 VITALS — BP 133/90 | HR 61 | Temp 96.0°F | Resp 22 | Ht 67.0 in | Wt 170.0 lb

## 2021-07-17 DIAGNOSIS — D125 Benign neoplasm of sigmoid colon: Secondary | ICD-10-CM | POA: Diagnosis not present

## 2021-07-17 DIAGNOSIS — D124 Benign neoplasm of descending colon: Secondary | ICD-10-CM

## 2021-07-17 DIAGNOSIS — Z1211 Encounter for screening for malignant neoplasm of colon: Secondary | ICD-10-CM | POA: Diagnosis not present

## 2021-07-17 MED ORDER — SODIUM CHLORIDE 0.9 % IV SOLN: 500.0000 mL | Freq: Once | INTRAVENOUS | Status: DC

## 2021-07-17 NOTE — Progress Notes (Signed)
Called to room to assist during endoscopic procedure.  Patient ID and intended procedure confirmed with present staff. Received instructions for my participation in the procedure from the performing physician.  

## 2021-07-17 NOTE — Progress Notes (Signed)
[facility] History and Physical   Primary Care Physician:  Hali Marry, MD   Reason for Procedure:   Colon cancer screening  Plan:    colonoscopy     HPI: Todd Parsons is a 75 y.o. male here for screening colonoscopy   Past Medical History:  Diagnosis Date   Arthritis    osteoarthritis   Bicuspid aortic valve    Cataract    Coronary artery disease    Dysrhythmia    atrial flutter   Echocardiogram with ECG monitoring 01/12/2010   60-65% EF   Hyperlipidemia    Hypertension    Pre-diabetes     Past Surgical History:  Procedure Laterality Date   A-FLUTTER ABLATION  2017   ACROMIO-CLAVICULAR JOINT REPAIR Right 1980s       apnea device implant     then removed.     CARDIAC CATHETERIZATION  07/2015   CARDIAC VALVE REPLACEMENT  08/05/2015   Aortic valve   COLONOSCOPY     CORONARY ARTERY BYPASS GRAFT  08/05/2015   left knee arthoscopy     NOSE SURGERY  late 50's   to fix a broken nose   TONSILECTOMY, ADENOIDECTOMY, BILATERAL MYRINGOTOMY AND TUBES     TOTAL KNEE ARTHROPLASTY Right 02/28/2017   Procedure: RIGHT TOTAL KNEE ARTHROPLASTY;  Surgeon: Dorna Leitz, MD;  Location: [facility];  Service: Orthopedics;  Laterality: Right;    Prior to Admission medications   Medication Sig Start Date End Date Taking? Authorizing Provider  aspirin EC 81 MG tablet Take 81 mg by mouth daily.   Yes [provider]  atorvastatin (LIPITOR) 40 MG tablet TAKE 1 TABLET BY MOUTH AT  BEDTIME 10/23/20  Yes Hali Marry, MD  fenofibrate 160 MG tablet TAKE 1 TABLET BY MOUTH  DAILY 04/30/20  Yes Hali Marry, MD  finasteride (PROSCAR) 5 MG tablet TAKE 1 TABLET BY MOUTH  DAILY 02/05/20  Yes Hali Marry, MD  FLUoxetine (PROZAC) 20 MG capsule TAKE 1 CAPSULE BY MOUTH  DAILY 10/23/20  Yes Hali Marry, MD  metFORMIN (GLUCOPHAGE-XR) 500 MG 24 hr tablet Take 1 tablet (500 mg total) by mouth daily with breakfast. 08/26/20  Yes Hali Marry, MD  metoprolol succinate (TOPROL-XL) 50 MG 24 hr tablet Take 1 tablet (50 mg total) by mouth daily. Take with or immediately following a meal. 07/07/21  Yes Hali Marry, MD  oxybutynin (DITROPAN) 5 MG tablet Take 1 tablet (5 mg total) by mouth 2 (two) times daily. 04/25/20  Yes Hali Marry, MD  tamsulosin (FLOMAX) 0.4 MG CAPS capsule Take 1 capsule (0.4 mg total) by mouth daily after supper. 04/25/20  Yes Hali Marry, MD  amLODipine (NORVASC) 5 MG tablet Take by mouth. Patient not taking: Reported on 07/17/2021 07/14/21   [provider]  cetirizine (ZYRTEC) 10 MG tablet Take 10 mg by mouth as needed for allergies.    [provider]  fluticasone (FLONASE) 50 MCG/ACT nasal spray Place 2 sprays into both nostrils daily as needed for allergies or rhinitis. 12/31/19   Hali Marry, MD  PFIZER-BIONT COVID-19 VAC-TRIS SUSP injection  12/09/20   [provider]  Pirfenidone (ESBRIET) 267 MG TABS Take 1 tab three times daily for 7 days, then 2 tabs three times daily for 7 days, then 3 tabs three times daily thereafter. Patient not taking: Reported on 07/17/2021 07/15/21   Marshell Garfinkel, MD  Pirfenidone (ESBRIET) 267 MG TABS Take 3 tablets (970  mg total) by mouth with breakfast, with lunch, and with evening meal. Patient not taking: Reported on 07/17/2021 07/15/21   Marshell Garfinkel, MD    Current Outpatient Medications  Medication Sig Dispense Refill   aspirin EC 81 MG tablet Take 81 mg by mouth daily.     atorvastatin (LIPITOR) 40 MG tablet TAKE 1 TABLET BY MOUTH AT  BEDTIME 90 tablet 3   fenofibrate 160 MG tablet TAKE 1 TABLET BY MOUTH  DAILY 90 tablet 3   finasteride (PROSCAR) 5 MG tablet TAKE 1 TABLET BY MOUTH  DAILY 90 tablet 3   FLUoxetine (PROZAC) 20 MG capsule TAKE 1 CAPSULE BY MOUTH  DAILY 90 capsule 3   metFORMIN (GLUCOPHAGE-XR) 500 MG 24 hr tablet Take 1 tablet (500 mg total) by mouth daily with breakfast. 1 tablet 0    metoprolol succinate (TOPROL-XL) 50 MG 24 hr tablet Take 1 tablet (50 mg total) by mouth daily. Take with or immediately following a meal. 90 tablet 1   oxybutynin (DITROPAN) 5 MG tablet Take 1 tablet (5 mg total) by mouth 2 (two) times daily. 180 tablet 3   tamsulosin (FLOMAX) 0.4 MG CAPS capsule Take 1 capsule (0.4 mg total) by mouth daily after supper. 90 capsule 3   amLODipine (NORVASC) 5 MG tablet Take by mouth. (Patient not taking: Reported on 07/17/2021)     cetirizine (ZYRTEC) 10 MG tablet Take 10 mg by mouth as needed for allergies.     fluticasone (FLONASE) 50 MCG/ACT nasal spray Place 2 sprays into both nostrils daily as needed for allergies or rhinitis. 48 g 3   PFIZER-BIONT COVID-19 VAC-TRIS SUSP injection      Pirfenidone (ESBRIET) 267 MG TABS Take 1 tab three times daily for 7 days, then 2 tabs three times daily for 7 days, then 3 tabs three times daily thereafter. (Patient not taking: Reported on 07/17/2021) 207 tablet 0   Pirfenidone (ESBRIET) 267 MG TABS Take 3 tablets (801 mg total) by mouth with breakfast, with lunch, and with evening meal. (Patient not taking: Reported on 07/17/2021) 270 tablet 4   Current Facility-Administered Medications  Medication Dose Route Frequency Provider Last Rate Last Admin   0.9 %  sodium chloride infusion  500 mL Intravenous Once Gatha Mayer, MD        Allergies as of 07/17/2021   (No Known Allergies)    Family History  Problem Relation Age of Onset   Stroke Mother 42   Cancer Mother        upper palate   Heart disease Father 72       bypass surgery   Colon cancer Neg Hx    Colon polyps Neg Hx    Esophageal cancer Neg Hx    Rectal cancer Neg Hx    Stomach cancer Neg Hx     Social History   Socioeconomic History   Marital status: Married    Spouse name:  [name]   Number of children: 3   Years of education: 18   Highest education level: Master's degree (e.g., MA, MS, MEng, MEd, MSW, MBA)  Occupational History    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 Will start back with doing this. Wife retiring in April.   Social Determinants of Health   Financial Resource Strain: Low Risk    Difficulty of Paying Living Expenses: Not hard at all  Food Insecurity: No Food Insecurity   Worried About Charity fundraiser in the Last Year: Never true   [employer] in the Last Year: Never true  Transportation Needs: No Transportation Needs   Lack of Transportation (Medical): No   Lack of Transportation (Non-Medical): No  Physical Activity: Inactive   Days of Exercise per Week: 0 days   Minutes of Exercise per Session: 0 min  Stress: No Stress Concern Present   Feeling of Stress : Not at all  Social Connections: Moderately Isolated   Frequency of Communication with Friends and Family: More than three times a week   Frequency of Social Gatherings with Friends and Family: Once a week   Attends Religious Services: Never   Marine scientist or Organizations: No   Attends Music therapist: Never   Marital Status: Married  Human resources officer Violence: Not At Risk   Fear of Current or Ex-Partner: No   Emotionally Abused: No   Physically Abused: No   Sexually Abused: No    Review of Systems:  All other review of systems negative except as mentioned in the HPI.  Physical Exam: Vital signs BP 112/70   Pulse (!) 50   Temp (!) 96 F (35.6 C) (Temporal)    Ht 5\' 7"  (1.702 m)   Wt 170 lb (77.1 kg)   SpO2 96%   BMI 26.63 kg/m   General:   Alert,  Well-developed, well-nourished, pleasant and cooperative in NAD Lungs:  Clear throughout to auscultation.   Heart:  Regular rate and rhythm; no murmurs, clicks, rubs,  or gallops. Abdomen:  Soft, nontender and nondistended. Normal bowel sounds.   Neuro/Psych:  Alert and cooperative. Normal mood and affect. A and O x 3   @Braylen Denunzio  Simonne Maffucci, MD, [facility] [phone] (pager) 07/17/2021 11:38 AM@

## 2021-07-17 NOTE — Progress Notes (Signed)
PT taken to PACU. Monitors in place. VSS. Report given to RN. 

## 2021-07-17 NOTE — Patient Instructions (Addendum)
I found and removed 3 tiny polyps that look benign. I will let you know pathology results and recommendations.  I appreciate the opportunity to care for you. Gatha Mayer, MD, FACG   YOU HAD AN ENDOSCOPIC PROCEDURE TODAY AT [facility]:   Refer to the procedure report that was given to you for any specific questions about what was found during the examination.  If the procedure report does not answer your questions, please call your gastroenterologist to clarify.  If you requested that your care partner not be given the details of your procedure findings, then the procedure report has been included in a sealed envelope for you to review at your convenience later.  YOU SHOULD EXPECT: Some feelings of bloating in the abdomen. Passage of more gas than usual.  Walking can help get rid of the air that was put into your GI tract during the procedure and reduce the bloating. If you had a lower endoscopy (such as a colonoscopy or flexible sigmoidoscopy) you may notice spotting of blood in your stool or on the toilet paper. If you underwent a bowel prep for your procedure, you may not have a normal bowel movement for a few days.  Please Note:  You might notice some irritation and congestion in your nose or some drainage.  This is from the oxygen used during your procedure.  There is no need for concern and it should clear up in a day or so.  SYMPTOMS TO REPORT IMMEDIATELY:  Following lower endoscopy (colonoscopy or flexible sigmoidoscopy):  Excessive amounts of blood in the stool  Significant tenderness or worsening of abdominal pains  Swelling of the abdomen that is new, acute  Fever of 100F or higher  For urgent or emergent issues, a gastroenterologist can be reached at any hour by calling [phone]. Do not use MyChart messaging for urgent concerns.    DIET:  We do recommend a small meal at first, but then you may proceed to your regular diet.  Drink plenty of fluids but  you should avoid alcoholic beverages for 24 hours.  ACTIVITY:  You should plan to take it easy for the rest of today and you should NOT DRIVE or use heavy machinery until tomorrow (because of the sedation medicines used during the test).    FOLLOW UP: Our staff will call the number listed on your records 48-72 hours following your procedure to check on you and address any questions or concerns that you may have regarding the information given to you following your procedure. If we do not reach you, we will leave a message.  We will attempt to reach you two times.  During this call, we will ask if you have developed any symptoms of COVID 19. If you develop any symptoms (ie: fever, flu-like symptoms, shortness of breath, cough etc.) before then, please call [phone].  If you test positive for Covid 19 in the 2 weeks post procedure, please call and report this information to [place].    If any biopsies were taken you will be contacted by phone or by letter within the next 1-3 weeks.  Please call us at [phone] if you have not heard about the biopsies in 3 weeks.    SIGNATURES/CONFIDENTIALITY: You and/or your care partner have signed paperwork which will be entered into your electronic medical record.  These signatures attest to the fact that that the information above on your After Visit Summary has been reviewed and is understood.  Full responsibility of the confidentiality of this discharge information lies with you and/or your care-partner.  

## 2021-07-17 NOTE — Progress Notes (Signed)
VS completed by DT.  Pt's states no medical or surgical changes since previsit or office visit.  

## 2021-07-17 NOTE — Op Note (Signed)
[facility] Patient Name: Joan Avetisyan Procedure Date: 07/17/2021 11:36 AM MRN: [id number] Endoscopist: Gatha Mayer , MD Age: 75 Referring MD:  Date of Birth: 04-Nov-1945 Gender: Male Account #: [id number] Procedure:                Colonoscopy Indications:              Screening for colorectal malignant neoplasm Medicines:                Propofol per Anesthesia, Monitored Anesthesia Care Procedure:                Pre-Anesthesia Assessment:                           - Prior to the procedure, a History and Physical                            was performed, and patient medications and                            allergies were reviewed. The patient's tolerance of                            previous anesthesia was also reviewed. The risks                            and benefits of the procedure and the sedation                            options and risks were discussed with the patient.                            All questions were answered, and informed consent                            was obtained. Prior Anticoagulants: The patient has                            taken no previous anticoagulant or antiplatelet                            agents. ASA Grade Assessment: III - A patient with                            severe systemic disease. After reviewing the risks                            and benefits, the patient was deemed in                            satisfactory condition to undergo the procedure.                           After obtaining informed consent, the colonoscope  was passed under direct vision. Throughout the                            procedure, the patient's blood pressure, pulse, and                            oxygen saturations were monitored continuously. The                            CF [id number] #[id number] was introduced through the anus                            and advanced to the the cecum, identified by                             appendiceal orifice and ileocecal valve. The                            colonoscopy was performed without difficulty. The                            patient tolerated the procedure well. The quality                            of the bowel preparation was good. The ileocecal                            valve, appendiceal orifice, and rectum were                            photographed. Scope In: 11:48:18 AM Scope Out: 12:06:02 PM Scope Withdrawal Time: 0 hours 12 minutes 48 seconds  Total Procedure Duration: 0 hours 17 minutes 44 seconds  Findings:                 The perianal and digital rectal examinations were                            normal.                           Three flat and sessile polyps were found in the                            sigmoid colon and descending colon. The polyps were                            4 to 8 mm in size. These polyps were removed with a                            cold snare. Resection and retrieval were complete.                            Verification of patient identification for the  specimen was done. Estimated blood loss was minimal.                           Multiple diverticula were found in the sigmoid                            colon.                           The exam was otherwise without abnormality on                            direct and retroflexion views. Complications:            No immediate complications. Estimated Blood Loss:     Estimated blood loss was minimal. Impression:               - Three 4 to 8 mm polyps in the sigmoid colon and                            in the descending colon, removed with a cold snare.                            Resected and retrieved.                           - Diverticulosis in the sigmoid colon.                           - The examination was otherwise normal on direct                            and retroflexion views. Recommendation:           - Patient has a  contact number available for                            emergencies. The signs and symptoms of potential                            delayed complications were discussed with the                            patient. Return to normal activities tomorrow.                            Written discharge instructions were provided to the                            patient.                           - Resume previous diet.                           - Continue present medications.                           -  Await pathology results.                           - No recommendation at this time regarding repeat                            colonoscopy due to age. Gatha Mayer, MD 07/17/2021 12:16:36 PM This report has been signed electronically.

## 2021-07-21 ENCOUNTER — Telehealth: Payer: Self-pay

## 2021-07-21 ENCOUNTER — Encounter: Payer: Self-pay | Admitting: Internal Medicine

## 2021-07-21 ENCOUNTER — Telehealth: Payer: Self-pay | Admitting: *Deleted

## 2021-07-21 NOTE — Telephone Encounter (Signed)
  Follow up Call-  Call back number 07/17/2021  Post procedure Call Back phone  # [phone]  Permission to leave phone message Yes  Some recent data might be hidden     Patient questions:  Do you have a fever, pain , or abdominal swelling? No. Pain Score  0 *  Have you tolerated food without any problems? Yes.    Have you been able to return to your normal activities? Yes.    Do you have any questions about your discharge instructions: Diet   No. Medications  No. Follow up visit  No.  Do you have questions or concerns about your Care? No.  Actions: * If pain score is 4 or above: No action needed, pain <4.  Have you developed a fever since your procedure? no  2.   Have you had an respiratory symptoms (SOB or cough) since your procedure? no  3.   Have you tested positive for COVID 19 since your procedure no  4.   Have you had any family members/close contacts diagnosed with the COVID 19 since your procedure?  no   If yes to any of these questions please route to Joylene John, RN and Joella Prince, RN

## 2021-07-21 NOTE — Telephone Encounter (Signed)
First attempt, left VM.  

## 2021-07-22 DIAGNOSIS — R350 Frequency of micturition: Secondary | ICD-10-CM | POA: Diagnosis not present

## 2021-08-21 ENCOUNTER — Other Ambulatory Visit: Payer: Self-pay

## 2021-08-21 ENCOUNTER — Encounter: Payer: Self-pay | Admitting: Pulmonary Disease

## 2021-08-21 ENCOUNTER — Ambulatory Visit: Payer: Medicare Other | Admitting: Pulmonary Disease

## 2021-08-21 VITALS — BP 116/64 | HR 68 | Temp 98.4°F | Ht 67.0 in | Wt 174.2 lb

## 2021-08-21 DIAGNOSIS — J84112 Idiopathic pulmonary fibrosis: Secondary | ICD-10-CM | POA: Diagnosis not present

## 2021-08-21 DIAGNOSIS — Z5181 Encounter for therapeutic drug level monitoring: Secondary | ICD-10-CM | POA: Diagnosis not present

## 2021-08-21 LAB — COMPREHENSIVE METABOLIC PANEL
ALT: 20 U/L (ref 0–53)
AST: 24 U/L (ref 0–37)
Albumin: 4 g/dL (ref 3.5–5.2)
Alkaline Phosphatase: 53 U/L (ref 39–117)
BUN: 18 mg/dL (ref 6–23)
CO2: 31 mEq/L (ref 19–32)
Calcium: 10.1 mg/dL (ref 8.4–10.5)
Chloride: 103 mEq/L (ref 96–112)
Creatinine, Ser: 1.06 mg/dL (ref 0.40–1.50)
GFR: 68.22 mL/min (ref >60.00)
Glucose, Bld: 111 mg/dL — ABNORMAL HIGH (ref 70–99)
Potassium: 3.9 mEq/L (ref 3.5–5.1)
Sodium: 139 mEq/L (ref 135–145)
Total Bilirubin: 0.6 mg/dL (ref 0.2–1.2)
Total Protein: 6.9 g/dL (ref 6.0–8.3)

## 2021-08-21 NOTE — Progress Notes (Signed)
Todd Parsons    [id number]    Jul 01, 1946  Primary Care Physician:Metheney, Rene Kocher, MD  Referring Physician: Hali Marry, MD [address] [place],  [place] [address]  Chief complaint:  Follow-up for ILD Started Esbriet November 6222  HPI: 75 year old ex-smoker with history of a flutter, aortic valve replacement, aortic aneurysm repair, hyperlipidemia, diabetes  Referred for evaluation of pulmonary fibrosis He was previously seen in 2018 by Dr. Elsworth Soho in the pulmonary clinic when he had a CT scan which showed pulmonary fibrosis.  He has been lost to follow-up since then. Complains of chronic dyspnea on exertion which has been stable over several years.  No symptoms at rest.  Denies any cough, sputum production, fevers, chills  Pets: Has a dog Occupation: Retired Advice worker for [employer] Exposures: No known exposures.  No mold, hot tub, Jacuzzi.  No feather pillows or comforter ILD questionnaire 06/17/2021-negative for exposures. Smoking history: 17-pack-year smoker.  Quit in 1979 Travel history: Originally from [place].  No significant recent travel Relevant family history: No family history of lung disease  Interim history: Started Esbriet last month and is at full dose now.  He is tolerating it well without any issue He has been referred to pulmonary rehab and is on the wait list  Has mild cough associated with postnasal drip.  Denies any dyspnea  Outpatient Encounter Medications as of 08/21/2021  Medication Sig   amLODipine (NORVASC) 5 MG tablet Take by mouth.   aspirin EC 81 MG tablet Take 81 mg by mouth daily.   atorvastatin (LIPITOR) 40 MG tablet TAKE 1 TABLET BY MOUTH AT  BEDTIME   cetirizine (ZYRTEC) 10 MG tablet Take 10 mg by mouth as needed for allergies.   fenofibrate 160 MG tablet TAKE 1 TABLET BY MOUTH  DAILY   finasteride (PROSCAR) 5 MG tablet TAKE 1 TABLET BY MOUTH  DAILY   FLUoxetine (PROZAC) 20 MG capsule  TAKE 1 CAPSULE BY MOUTH  DAILY   fluticasone (FLONASE) 50 MCG/ACT nasal spray Place 2 sprays into both nostrils daily as needed for allergies or rhinitis.   metFORMIN (GLUCOPHAGE-XR) 500 MG 24 hr tablet Take 1 tablet (500 mg total) by mouth daily with breakfast.   metoprolol succinate (TOPROL-XL) 50 MG 24 hr tablet Take 1 tablet (50 mg total) by mouth daily. Take with or immediately following a meal.   PFIZER-BIONT COVID-19 VAC-TRIS SUSP injection    Pirfenidone (ESBRIET) 267 MG TABS Take 3 tablets (801 mg total) by mouth with breakfast, with lunch, and with evening meal.   solifenacin (VESICARE) 5 MG tablet Take 5 mg by mouth daily.   tamsulosin (FLOMAX) 0.4 MG CAPS capsule Take 1 capsule (0.4 mg total) by mouth daily after supper.   [DISCONTINUED] Pirfenidone (ESBRIET) 267 MG TABS Take 1 tab three times daily for 7 days, then 2 tabs three times daily for 7 days, then 3 tabs three times daily thereafter.   [DISCONTINUED] oxybutynin (DITROPAN) 5 MG tablet Take 1 tablet (5 mg total) by mouth 2 (two) times daily.   No facility-administered encounter medications on file as of 08/21/2021.    Allergies as of 08/21/2021   (No Known Allergies)    Physical Exam: Blood pressure 116/64, pulse 68, temperature 98.4 F (36.9 C), temperature source Oral, height 5\' 7"  (1.702 m), weight 174 lb 3.2 oz (79 kg), SpO2 94 %. Gen:      No acute distress HEENT:  EOMI, sclera  anicteric Neck:     No masses; no thyromegaly Lungs:    Bibasal crackles CV:         Regular rate and rhythm; no murmurs Abd:      + bowel sounds; soft, non-tender; no palpable masses, no distension Ext:    No edema; adequate peripheral perfusion Skin:      Warm and dry; no rash Neuro: alert and oriented x 3 Psych: normal mood and affect   Data Reviewed: Imaging: High-res CT 07/22/2017-pulmonary fibrosis, probable UIP High-res CT 09/09/2020-slight worsening of pulmonary fibrosis and probable UIP pattern I have reviewed the images  personally.  PFTs: 08/24/2017 FVC 2.02 [50%], FEV1 1.89 [69%], F/F 94, TLC 3.62 [56%], DLCO 15.57 [5%] Severe restriction and diffusion defect  Labs: CTD, hypersensitivity serologies 08/24/2017-negative ANA, CCP, negative hypersensitivity panel  Assessment:  Pulmonary fibrosis He had CT scans on record which shows pulmonary fibrosis slightly progressive.  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  There may be a hint of honeycombing at the base as well by my review.  Note CT reports from even before from the [place] that shows reticulation so the process appears to be progressive  Although the initial scan was read as hypersensitivity pneumonitis he does not have any known exposures or signs and symptoms of connective tissue disease.  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  Check comprehensive metabolic panel today and monthly He is awaiting initiation of pulmonary rehab.  Referral has already been made  Cough, postnasal drip He will try over-the-counter antihistamine such as Benadryl, chlorpheniramine and steroid nasal spray Nasonex  Plan/Recommendations: Continue Esbriet Follow labs Pulmonary rehab  Marshell Garfinkel MD [facility] 08/21/2021, 10:14 AM  CC: Hali Marry, *

## 2021-08-21 NOTE — Patient Instructions (Addendum)
I am glad you are tolerating the Esbriet well We will send in a prescription for the 801 mg tablets 3 times daily We will check liver test today and monthly  For cough and postnasal drip he can take over-the-counter antihistamine such as Benadryl or chlorpheniramine 2 times a day.  He can also use over-the-counter steroid nasal spray such as Nasonex 2 times a day. Follow-up in 3 months for return visit

## 2021-08-26 ENCOUNTER — Telehealth: Payer: Self-pay | Admitting: Pulmonary Disease

## 2021-08-26 DIAGNOSIS — J84112 Idiopathic pulmonary fibrosis: Secondary | ICD-10-CM

## 2021-08-26 MED ORDER — PIRFENIDONE 801 MG PO TABS: 801.0000 mg | ORAL_TABLET | Freq: Three times a day (TID) | ORAL | 5 refills | Status: DC

## 2021-08-26 NOTE — Telephone Encounter (Signed)
Rx sent to [facility] for higher strength Esbriet to reduce patient's pill burden.  Rx for 801mg  three times daily sent to [facility] Patient aware that rx must go to [facility] and cannot be filled at local pharmacy.  Knox Saliva, PharmD, MPH, BCPS Clinical Pharmacist (Rheumatology and Pulmonology)

## 2021-08-26 NOTE — Telephone Encounter (Signed)
Pt currently on Esbriet 3tabs x3 times a day. Pt states Mannam was going to change rx to 1 tab 3x a day. Pt is almost out. Please advise. Please send to [facility]. Please advise [phone]

## 2021-09-02 ENCOUNTER — Telehealth (HOSPITAL_COMMUNITY): Payer: Self-pay

## 2021-09-02 NOTE — Telephone Encounter (Signed)
Called patient to see if he was interested in participating in the Pulmonary Rehab Program. Patient stated yes. Patient will come in for orientation on 09/21/2021@9 :00am and will attend the 10:15am exercise class.   Tourist information centre manager.

## 2021-09-09 ENCOUNTER — Telehealth: Payer: Self-pay | Admitting: Pulmonary Disease

## 2021-09-09 NOTE — Telephone Encounter (Signed)
Returned call to patient. Advised rx for Esbriet was sent on 08/26/21. Provided with [facility] phone number to schedule shipment.  Patient advised that rx for higher strength tablets of 801mg /tab to reduce pill burden. He verbalized understanding  Knox Saliva, PharmD, MPH, BCPS Clinical Pharmacist (Rheumatology and Pulmonology)

## 2021-09-11 NOTE — Telephone Encounter (Signed)
Pt insurance is active and benefits verified through [facility] Medicare Co-pay $20, DED 0/0 met, out of pocket $3,600/0 met, co-insurance 0%. NO pre-authorization required. Passport, 09/10/2021@3 :57pm, REF# [id number]

## 2021-09-15 ENCOUNTER — Telehealth (HOSPITAL_COMMUNITY): Payer: Self-pay | Admitting: *Deleted

## 2021-09-21 ENCOUNTER — Inpatient Hospital Stay (HOSPITAL_COMMUNITY)
Admission: RE | Admit: 2021-09-21 | Discharge: 2021-09-21 | Disposition: A | Discharge status: Home / Self Care | Payer: Medicare Other | Source: Ambulatory Visit

## 2021-09-21 ENCOUNTER — Encounter (HOSPITAL_COMMUNITY)
Admission: RE | Admit: 2021-09-21 | Discharge: 2021-09-21 | Disposition: A | Discharge status: Home / Self Care | Payer: Medicare Other | Source: Ambulatory Visit | Attending: Pulmonary Disease | Admitting: Pulmonary Disease

## 2021-09-21 ENCOUNTER — Encounter (HOSPITAL_COMMUNITY): Payer: Self-pay

## 2021-09-21 ENCOUNTER — Other Ambulatory Visit: Payer: Self-pay

## 2021-09-21 VITALS — BP 115/72 | HR 52 | Ht 67.0 in | Wt 174.6 lb

## 2021-09-21 DIAGNOSIS — J849 Interstitial pulmonary disease, unspecified: Secondary | ICD-10-CM | POA: Diagnosis not present

## 2021-09-21 NOTE — Progress Notes (Signed)
Mr. Kolodziejski was confused as to when his appointment was in pulmonary rehab and arrived at 10:15 am, the orientation was done at that time of arrival. I listened to his lungs which were clear, grips strong and equal, 2+ bilateral posterior tibial pulses present without peripheral edema.

## 2021-09-21 NOTE — Progress Notes (Signed)
Todd Parsons was a no show, no call for orientation to pulmonary rehab today.

## 2021-09-21 NOTE — Progress Notes (Signed)
Todd Parsons 76 y.o. male Pulmonary Rehab Orientation Note This patient who was referred to Pulmonary Rehab by Dr. Vaughan Browner with the diagnosis of Interstitial Lung Disease arrived today in Cardiac and Pulmonary Rehab. He arrived ambulatory normal gait. He does not carry portable oxygen. Per pt, Todd Parsons uses oxygen never. Color good, skin warm and dry. Patient is oriented to time and place. Patient's medical history, psychosocial health, and medications reviewed. Psychosocial assessment reveals pt lives with spouse. Todd Parsons is currently retired. Pt hobbies include gardening. Pt reports his stress level is low. He had no stressors to report. Pt does not exhibit signs of depression. PHQ2/9 score 0/0. Todd Parsons shows good  coping skills with positive outlook on life. Offered emotional support and reassurance. Will continue to monitor. Physical assessment completed by Rosebud Poles, RN. Please see Todd Parsons's note. Todd Parsons reports he  does take medications as prescribed. Patient states he  follows a Regular diet. The patient has been trying to lose weight through a healthy diet and exercise program.. Pt's weight will be monitored closely. Demonstration and practice of PLB using pulse oximeter. Todd Parsons able to return demonstration satisfactorily. Safety and hand hygiene in the exercise area reviewed with patient. Todd Parsons voices understanding of the information reviewed. Department expectations discussed with patient and achievable goals were set. The patient shows enthusiasm about attending the program and we look forward to working with Todd Parsons. Todd Parsons completed a 6 min walk test today and is scheduled to begin exercise on 09-29-2021 at 10:15am.   1030-1150 Todd Parsons

## 2021-09-22 NOTE — Progress Notes (Deleted)
Pulmonary Individual Treatment Plan  Patient Details  Name: Todd Parsons MRN: [id number] Date of Birth: Mar 03, 1946 Referring Provider:   Flowsheet Row PULMONARY REHAB OTHER RESP ORIENTATION from 09/21/2021 in [place]  Referring Provider Mannam       Initial Encounter Date:  Flowsheet Row PULMONARY REHAB OTHER RESP ORIENTATION from 09/21/2021 in [place]  Date 09/22/21       Visit Diagnosis: No diagnosis found.  Patient's Home Medications on Admission:   Current Outpatient Medications:    amLODipine (NORVASC) 5 MG tablet, Take 10 mg by mouth., Disp: , Rfl:    aspirin EC 81 MG tablet, Take 81 mg by mouth daily., Disp: , Rfl:    atorvastatin (LIPITOR) 40 MG tablet, TAKE 1 TABLET BY MOUTH AT  BEDTIME, Disp: 90 tablet, Rfl: 3   cetirizine (ZYRTEC) 10 MG tablet, Take 10 mg by mouth as needed for allergies., Disp: , Rfl:    fenofibrate 160 MG tablet, TAKE 1 TABLET BY MOUTH  DAILY, Disp: 90 tablet, Rfl: 3   finasteride (PROSCAR) 5 MG tablet, TAKE 1 TABLET BY MOUTH  DAILY, Disp: 90 tablet, Rfl: 3   FLUoxetine (PROZAC) 20 MG capsule, TAKE 1 CAPSULE BY MOUTH  DAILY, Disp: 90 capsule, Rfl: 3   fluticasone (FLONASE) 50 MCG/ACT nasal spray, Place 2 sprays into both nostrils daily as needed for allergies or rhinitis., Disp: 48 g, Rfl: 3   metFORMIN (GLUCOPHAGE-XR) 500 MG 24 hr tablet, Take 1 tablet (500 mg total) by mouth daily with breakfast., Disp: 1 tablet, Rfl: 0   metoprolol succinate (TOPROL-XL) 50 MG 24 hr tablet, Take 1 tablet (50 mg total) by mouth daily. Take with or immediately following a meal., Disp: 90 tablet, Rfl: 1   PFIZER-BIONT COVID-19 VAC-TRIS SUSP injection, , Disp: , Rfl:    Pirfenidone (ESBRIET) 801 MG TABS, Take 801 mg by mouth with breakfast, with lunch, and with evening meal., Disp: 90 tablet, Rfl: 5   solifenacin (VESICARE) 5 MG tablet, Take 5 mg by mouth daily., Disp: , Rfl:    tamsulosin (FLOMAX) 0.4  MG CAPS capsule, Take 1 capsule (0.4 mg total) by mouth daily after supper., Disp: 90 capsule, Rfl: 3  Past Medical History: Past Medical History:  Diagnosis Date   Arthritis    osteoarthritis   Bicuspid aortic valve    Cataract    Coronary artery disease    Dysrhythmia    atrial flutter   Echocardiogram with ECG monitoring 01/12/2010   60-65% EF   Hyperlipidemia    Hypertension    Pre-diabetes     Tobacco Use: Social History   Tobacco Use  Smoking Status Former   Years: 17.00   Types: Cigarettes   Quit date: 04/24/1978   Years since quitting: 43.4  Smokeless Tobacco Never    Labs: Recent Review Flowsheet Data     Labs for ITP Cardiac and Pulmonary Rehab Latest Ref Rng & Units 09/06/2019 12/31/2019 04/25/2020 08/26/2020 12/25/2020   Cholestrol 100 - 199 mg/dL - 118 - - -   LDLCALC 0 - 99 mg/dL - 61 - - -   HDL >39 mg/dL - 34(L) - - -   Trlycerides 0 - 149 mg/dL - 126 - - -   Hemoglobin A1c 4.8 - 5.6 % 6.0(A) 5.9(A) 5.7(A) 5.6 6.0(H)       Capillary Blood Glucose: Lab Results  Component Value Date   GLUCAP 147 (H) 03/02/2017   GLUCAP 154 (H)  03/02/2017   GLUCAP 207 (H) 03/01/2017   GLUCAP 154 (H) 03/01/2017   GLUCAP 185 (H) 03/01/2017     Pulmonary Assessment Scores:  Pulmonary Assessment Scores     Row Name 09/21/21 1109         ADL UCSD   ADL Phase Entry     SOB Score total 11       CAT Score   CAT Score 9       mMRC Score   mMRC Score 1             UCSD: Self-administered rating of dyspnea associated with activities of daily living (ADLs) 6-point scale (0 = "not at all" to 5 = "maximal or unable to do because of breathlessness")  Scoring Scores range from 0 to 120.  Minimally important difference is 5 units  CAT: CAT can identify the health impairment of COPD patients and is better correlated with disease progression.  CAT has a scoring range of zero to 40. The CAT score is classified into four groups of low (less than 10), medium (10  - 20), high (21-30) and very high (31-40) based on the impact level of disease on health status. A CAT score over 10 suggests significant symptoms.  A worsening CAT score could be explained by an exacerbation, poor medication adherence, poor inhaler technique, or progression of COPD or comorbid conditions.  CAT MCID is 2 points  mMRC: mMRC (Modified Medical Research Council) Dyspnea Scale is used to assess the degree of baseline functional disability in patients of respiratory disease due to dyspnea. No minimal important difference is established. A decrease in score of 1 point or greater is considered a positive change.   Pulmonary Function Assessment:  Pulmonary Function Assessment - 09/21/21 1154       Breath   Bilateral Breath Sounds Clear    Shortness of Breath Yes;Limiting activity             Exercise Target Goals: Exercise Program Goal: Individual exercise prescription set using results from initial 6 min walk test and THRR while considering  patients activity barriers and safety.   Exercise Prescription Goal: Initial exercise prescription builds to 30-45 minutes a day of aerobic activity, 2-3 days per week.  Home exercise guidelines will be given to patient during program as part of exercise prescription that the participant will acknowledge.  Activity Barriers & Risk Stratification:  Activity Barriers & Cardiac Risk Stratification - 09/21/21 1051       Activity Barriers & Cardiac Risk Stratification   Activity Barriers Right Knee Replacement;Shortness of Breath;Deconditioning;Arthritis    Cardiac Risk Stratification Moderate             6 Minute Walk:  6 Minute Walk     Row Name 09/22/21 0739         6 Minute Walk   Phase Initial     Distance 800 feet     Walk Time 6 minutes     # of Rest Breaks 1  3:30-4:30     MPH 1.52     METS 175     RPE 13     Perceived Dyspnea  2     VO2 Peak 6.14     Symptoms No     Resting HR 52 bpm     Resting BP 115/72      Resting Oxygen Saturation  95 %     Exercise Oxygen Saturation  during 6 min walk 83 %  Max Ex. HR 115 bpm     Max Ex. BP 120/72     2 Minute Post BP 120/70       Interval HR   1 Minute HR 58     2 Minute HR 91     3 Minute HR 114     4 Minute HR 115     5 Minute HR 112     6 Minute HR 112     2 Minute Post HR 60     Interval Heart Rate? Yes       Interval Oxygen   Interval Oxygen? Yes     Baseline Oxygen Saturation % 95 %     1 Minute Oxygen Saturation % 92 %     1 Minute Liters of Oxygen 0 L     2 Minute Oxygen Saturation % 90 %     2 Minute Liters of Oxygen 0 L     3 Minute Oxygen Saturation % 87 %     3 Minute Liters of Oxygen 0 L     4 Minute Oxygen Saturation % 83 %     4 Minute Liters of Oxygen 0 L  Increased to 1L     5 Minute Oxygen Saturation % 94 %     5 Minute Liters of Oxygen 1 L     6 Minute Oxygen Saturation % 89 %     6 Minute Liters of Oxygen 1 L     2 Minute Post Oxygen Saturation % 93 %     2 Minute Post Liters of Oxygen 1 L              Oxygen Initial Assessment:  Oxygen Initial Assessment - 09/22/21 0737       Home Oxygen   Home Oxygen Device None    Sleep Oxygen Prescription None    Home Exercise Oxygen Prescription None    Home Resting Oxygen Prescription None      Initial 6 min Walk   Oxygen Used Continuous    Liters per minute 1      Program Oxygen Prescription   Program Oxygen Prescription Continuous    Liters per minute 1    Comments O2 sat 83% RA after 4 min. O2 sat 89% on 1L      Intervention   Short Term Goals To learn and exhibit compliance with exercise, home and travel O2 prescription;To learn and understand importance of monitoring SPO2 with pulse oximeter and demonstrate accurate use of the pulse oximeter.;To learn and understand importance of maintaining oxygen saturations>88%;To learn and demonstrate proper pursed lip breathing techniques or other breathing techniques. ;To learn and demonstrate proper use of  respiratory medications    Long  Term Goals Exhibits compliance with exercise, home  and travel O2 prescription;Verbalizes importance of monitoring SPO2 with pulse oximeter and return demonstration;Maintenance of O2 saturations>88%;Exhibits proper breathing techniques, such as pursed lip breathing or other method taught during program session;Compliance with respiratory medication;Demonstrates proper use of MDIs             Oxygen Re-Evaluation:   Oxygen Discharge (Final Oxygen Re-Evaluation):   Initial Exercise Prescription:  Initial Exercise Prescription - 09/22/21 0700       Date of Initial Exercise RX and Referring Provider   Date 09/22/21    Referring Provider Mannam    Expected Discharge Date 11/26/21      Oxygen   Oxygen Continuous    Liters 1    Maintain  Oxygen Saturation 88% or higher      Track   Minutes 15    METs 1.75      Prescription Details   Frequency (times per week) 2    Duration Progress to 30 minutes of continuous aerobic without signs/symptoms of physical distress      Intensity   THRR 40-80% of Max Heartrate 58-101    Ratings of Perceived Exertion 11-13    Perceived Dyspnea 0-4      Progression   Progression Continue to progress workloads to maintain intensity without signs/symptoms of physical distress.      Resistance Training   Training Prescription Yes    Weight blue bands    Reps 10-15             Perform Capillary Blood Glucose checks as needed.  Exercise Prescription Changes:   Exercise Comments:   Exercise Goals and Review:   Exercise Goals     Row Name 09/21/21 1052             Exercise Goals   Increase Physical Activity Yes       Intervention Provide advice, education, support and counseling about physical activity/exercise needs.;Develop an individualized exercise prescription for aerobic and resistive training based on initial evaluation findings, risk stratification, comorbidities and participant's  personal goals.       Expected Outcomes Short Term: Attend rehab on a regular basis to increase amount of physical activity.;Long Term: Add in home exercise to make exercise part of routine and to increase amount of physical activity.;Long Term: Exercising regularly at least 3-5 days a week.       Increase Strength and Stamina Yes       Intervention Provide advice, education, support and counseling about physical activity/exercise needs.;Develop an individualized exercise prescription for aerobic and resistive training based on initial evaluation findings, risk stratification, comorbidities and participant's personal goals.       Expected Outcomes Short Term: Increase workloads from initial exercise prescription for resistance, speed, and METs.;Short Term: Perform resistance training exercises routinely during rehab and add in resistance training at home;Long Term: Improve cardiorespiratory fitness, muscular endurance and strength as measured by increased METs and functional capacity (6MWT)       Able to understand and use rate of perceived exertion (RPE) scale Yes       Intervention Provide education and explanation on how to use RPE scale       Expected Outcomes Short Term: Able to use RPE daily in rehab to express subjective intensity level;Long Term:  Able to use RPE to guide intensity level when exercising independently       Able to understand and use Dyspnea scale Yes       Intervention Provide education and explanation on how to use Dyspnea scale       Expected Outcomes Short Term: Able to use Dyspnea scale daily in rehab to express subjective sense of shortness of breath during exertion;Long Term: Able to use Dyspnea scale to guide intensity level when exercising independently       Knowledge and understanding of Target Heart Rate Range (THRR) Yes       Intervention Provide education and explanation of THRR including how the numbers were predicted and where they are located for reference        Expected Outcomes Short Term: Able to state/look up THRR;Long Term: Able to use THRR to govern intensity when exercising independently;Short Term: Able to use daily as guideline for intensity in rehab  Understanding of Exercise Prescription Yes       Intervention Provide education, explanation, and written materials on patient's individual exercise prescription       Expected Outcomes Short Term: Able to explain program exercise prescription;Long Term: Able to explain home exercise prescription to exercise independently                Exercise Goals Re-Evaluation :   Discharge Exercise Prescription (Final Exercise Prescription Changes):   Nutrition:  Target Goals: Understanding of nutrition guidelines, daily intake of sodium 1500mg , cholesterol 200mg , calories 30% from fat and 7% or less from saturated fats, daily to have 5 or more servings of fruits and vegetables.  Biometrics:  Pre Biometrics - 09/21/21 1155       Pre Biometrics   Grip Strength 43 kg              Nutrition Therapy Plan and Nutrition Goals:   Nutrition Assessments:  MEDIFICTS Score Key: ?70 Need to make dietary changes  40-70 Heart Healthy Diet ? 40 Therapeutic Level Cholesterol Diet   Picture Your Plate Scores: <94 Unhealthy dietary pattern with much room for improvement. 41-50 Dietary pattern unlikely to meet recommendations for good health and room for improvement. 51-60 More healthful dietary pattern, with some room for improvement.  >60 Healthy dietary pattern, although there may be some specific behaviors that could be improved.    Nutrition Goals Re-Evaluation:   Nutrition Goals Discharge (Final Nutrition Goals Re-Evaluation):   Psychosocial: Target Goals: Acknowledge presence or absence of significant depression and/or stress, maximize coping skills, provide positive support system. Participant is able to verbalize types and ability to use techniques and skills needed for  reducing stress and depression.  Initial Review & Psychosocial Screening:  Initial Psych Review & Screening - 09/21/21 1103       Initial Review   Current issues with None Identified      Family Dynamics   Good Support System? Yes    Comments wife and children      Barriers   Psychosocial barriers to participate in program There are no identifiable barriers or psychosocial needs.      Screening Interventions   Interventions Encouraged to exercise             Quality of Life Scores:  Scores of 19 and below usually indicate a poorer quality of life in these areas.  A difference of  2-3 points is a clinically meaningful difference.  A difference of 2-3 points in the total score of the Quality of Life Index has been associated with significant improvement in overall quality of life, self-image, physical symptoms, and general health in studies assessing change in quality of life.  PHQ-9: Recent Review Flowsheet Data     Depression screen [facility] 2/9 09/21/2021 05/23/2021 12/25/2020 08/26/2020 04/26/2019   Decreased Interest 0 0 0 0 0   Down, Depressed, Hopeless 0 0 0 0 0   PHQ - 2 Score 0 0 0 0 0   Altered sleeping 0 - - - -   Tired, decreased energy 0 - - - -   Change in appetite 0 - - - -   Feeling bad or failure about yourself  0 - - - -   Trouble concentrating 0 - - - -   Moving slowly or fidgety/restless 0 - - - -   Suicidal thoughts 0 - - - -   PHQ-9 Score 0 - - - -   Difficult doing work/chores Not difficult  at all - - - -      Interpretation of Total Score  Total Score Depression Severity:  1-4 = Minimal depression, 5-9 = Mild depression, 10-14 = Moderate depression, 15-19 = Moderately severe depression, 20-27 = Severe depression   Psychosocial Evaluation and Intervention:  Psychosocial Evaluation - 09/21/21 1103       Psychosocial Evaluation & Interventions   Interventions Encouraged to exercise with the program and follow exercise prescription              Psychosocial Re-Evaluation:   Psychosocial Discharge (Final Psychosocial Re-Evaluation):   Education: Education Goals: Education classes will be provided on a weekly basis, covering required topics. Participant will state understanding/return demonstration of topics presented.  Learning Barriers/Preferences:   Education Topics: Risk Factor Reduction:  -Group instruction that is supported by a PowerPoint presentation. Instructor discusses the definition of a risk factor, different risk factors for pulmonary disease, and how the heart and lungs work together.     Nutrition for Pulmonary Patient:  -Group instruction provided by PowerPoint slides, verbal discussion, and written materials to support subject matter. The instructor gives an explanation and review of healthy diet recommendations, which includes a discussion on weight management, recommendations for fruit and vegetable consumption, as well as protein, fluid, caffeine, fiber, sodium, sugar, and alcohol. Tips for eating when patients are short of breath are discussed.   Pursed Lip Breathing:  -Group instruction that is supported by demonstration and informational handouts. Instructor discusses the benefits of pursed lip and diaphragmatic breathing and detailed demonstration on how to preform both.     Oxygen Safety:  -Group instruction provided by PowerPoint, verbal discussion, and written material to support subject matter. There is an overview of What is Oxygen and Why do we need it.  Instructor also reviews how to create a safe environment for oxygen use, the importance of using oxygen as prescribed, and the risks of noncompliance. There is a brief discussion on traveling with oxygen and resources the patient may utilize.   Oxygen Equipment:  -Group instruction provided by [facility] Staff utilizing handouts, written materials, and equipment demonstrations.   Signs and Symptoms:  -Group instruction provided by  written material and verbal discussion to support subject matter. Warning signs and symptoms of infection, stroke, and heart attack are reviewed and when to call the physician/911 reinforced. Tips for preventing the spread of infection discussed.   Advanced Directives:  -Group instruction provided by verbal instruction and written material to support subject matter. Instructor reviews Advanced Directive laws and proper instruction for filling out document.   Pulmonary Video:  -Group video education that reviews the importance of medication and oxygen compliance, exercise, good nutrition, pulmonary hygiene, and pursed lip and diaphragmatic breathing for the pulmonary patient.   Exercise for the Pulmonary Patient:  -Group instruction that is supported by a PowerPoint presentation. Instructor discusses benefits of exercise, core components of exercise, frequency, duration, and intensity of an exercise routine, importance of utilizing pulse oximetry during exercise, safety while exercising, and options of places to exercise outside of rehab.     Pulmonary Medications:  -Verbally interactive group education provided by instructor with focus on inhaled medications and proper administration.   Anatomy and Physiology of the Respiratory System and Intimacy:  -Group instruction provided by PowerPoint, verbal discussion, and written material to support subject matter. Instructor reviews respiratory cycle and anatomical components of the respiratory system and their functions. Instructor also reviews differences in obstructive and restrictive respiratory diseases with examples  of each. Intimacy, Sex, and Sexuality differences are reviewed with a discussion on how relationships can change when diagnosed with pulmonary disease. Common sexual concerns are reviewed.   MD DAY -A group question and answer session with a medical doctor that allows participants to ask questions that relate to their pulmonary  disease state.   OTHER EDUCATION -Group or individual verbal, written, or video instructions that support the educational goals of the pulmonary rehab program.   Holiday Eating Survival Tips:  -Group instruction provided by PowerPoint slides, verbal discussion, and written materials to support subject matter. The instructor gives patients tips, tricks, and techniques to help them not only survive but enjoy the holidays despite the onslaught of food that accompanies the holidays.   Knowledge Questionnaire Score:  Knowledge Questionnaire Score - 09/21/21 1108       Knowledge Questionnaire Score   Pre Score 17/18             Core Components/Risk Factors/Patient Goals at Admission:  Personal Goals and Risk Factors at Admission - 09/21/21 1104       Core Components/Risk Factors/Patient Goals on Admission   Improve shortness of breath with ADL's Yes    Intervention Provide education, individualized exercise plan and daily activity instruction to help decrease symptoms of SOB with activities of daily living.    Expected Outcomes Short Term: Improve cardiorespiratory fitness to achieve a reduction of symptoms when performing ADLs;Long Term: Be able to perform more ADLs without symptoms or delay the onset of symptoms             Core Components/Risk Factors/Patient Goals Review:    Core Components/Risk Factors/Patient Goals at Discharge (Final Review):    ITP Comments:  Dr. Rodman Pickle is Medical Director for Pulmonary Rehab at [facility]

## 2021-09-22 NOTE — Progress Notes (Addendum)
Pulmonary Individual Treatment Plan  Patient Details  Name: Todd Parsons MRN: [id number] Date of Birth: December 27, 1945 Referring Provider:   Flowsheet Row PULMONARY REHAB OTHER RESP ORIENTATION from 09/21/2021 in [place]  Referring Provider Mannam       Initial Encounter Date:  Flowsheet Row PULMONARY REHAB OTHER RESP ORIENTATION from 09/21/2021 in [place]  Date 09/22/21       Visit Diagnosis: ILD (interstitial lung disease) ([place])  Patient's Home Medications on Admission:   Current Outpatient Medications:    amLODipine (NORVASC) 5 MG tablet, Take 10 mg by mouth., Disp: , Rfl:    aspirin EC 81 MG tablet, Take 81 mg by mouth daily., Disp: , Rfl:    atorvastatin (LIPITOR) 40 MG tablet, TAKE 1 TABLET BY MOUTH AT  BEDTIME, Disp: 90 tablet, Rfl: 3   cetirizine (ZYRTEC) 10 MG tablet, Take 10 mg by mouth as needed for allergies., Disp: , Rfl:    fenofibrate 160 MG tablet, TAKE 1 TABLET BY MOUTH  DAILY, Disp: 90 tablet, Rfl: 3   finasteride (PROSCAR) 5 MG tablet, TAKE 1 TABLET BY MOUTH  DAILY, Disp: 90 tablet, Rfl: 3   FLUoxetine (PROZAC) 20 MG capsule, TAKE 1 CAPSULE BY MOUTH  DAILY, Disp: 90 capsule, Rfl: 3   fluticasone (FLONASE) 50 MCG/ACT nasal spray, Place 2 sprays into both nostrils daily as needed for allergies or rhinitis., Disp: 48 g, Rfl: 3   metFORMIN (GLUCOPHAGE-XR) 500 MG 24 hr tablet, Take 1 tablet (500 mg total) by mouth daily with breakfast., Disp: 1 tablet, Rfl: 0   metoprolol succinate (TOPROL-XL) 50 MG 24 hr tablet, Take 1 tablet (50 mg total) by mouth daily. Take with or immediately following a meal., Disp: 90 tablet, Rfl: 1   PFIZER-BIONT COVID-19 VAC-TRIS SUSP injection, , Disp: , Rfl:    Pirfenidone (ESBRIET) 801 MG TABS, Take 801 mg by mouth with breakfast, with lunch, and with evening meal., Disp: 90 tablet, Rfl: 5   solifenacin (VESICARE) 5 MG tablet, Take 5 mg by mouth daily., Disp: , Rfl:     tamsulosin (FLOMAX) 0.4 MG CAPS capsule, Take 1 capsule (0.4 mg total) by mouth daily after supper., Disp: 90 capsule, Rfl: 3  Past Medical History: Past Medical History:  Diagnosis Date   Arthritis    osteoarthritis   Bicuspid aortic valve    Cataract    Coronary artery disease    Dysrhythmia    atrial flutter   Echocardiogram with ECG monitoring 01/12/2010   60-65% EF   Hyperlipidemia    Hypertension    Pre-diabetes     Tobacco Use: Social History   Tobacco Use  Smoking Status Former   Years: 17.00   Types: Cigarettes   Quit date: 04/24/1978   Years since quitting: 43.4  Smokeless Tobacco Never    Labs: Recent Review Flowsheet Data     Labs for ITP Cardiac and Pulmonary Rehab Latest Ref Rng & Units 09/06/2019 12/31/2019 04/25/2020 08/26/2020 12/25/2020   Cholestrol 100 - 199 mg/dL - 118 - - -   LDLCALC 0 - 99 mg/dL - 61 - - -   HDL >39 mg/dL - 34(L) - - -   Trlycerides 0 - 149 mg/dL - 126 - - -   Hemoglobin A1c 4.8 - 5.6 % 6.0(A) 5.9(A) 5.7(A) 5.6 6.0(H)       Capillary Blood Glucose: Lab Results  Component Value Date   GLUCAP 147 (H) 03/02/2017   GLUCAP  154 (H) 03/02/2017   GLUCAP 207 (H) 03/01/2017   GLUCAP 154 (H) 03/01/2017   GLUCAP 185 (H) 03/01/2017     Pulmonary Assessment Scores:  Pulmonary Assessment Scores     Row Name 09/21/21 1109         ADL UCSD   ADL Phase Entry     SOB Score total 11       CAT Score   CAT Score 9       mMRC Score   mMRC Score 1             UCSD: Self-administered rating of dyspnea associated with activities of daily living (ADLs) 6-point scale (0 = "not at all" to 5 = "maximal or unable to do because of breathlessness")  Scoring Scores range from 0 to 120.  Minimally important difference is 5 units  CAT: CAT can identify the health impairment of COPD patients and is better correlated with disease progression.  CAT has a scoring range of zero to 40. The CAT score is classified into four groups of low  (less than 10), medium (10 - 20), high (21-30) and very high (31-40) based on the impact level of disease on health status. A CAT score over 10 suggests significant symptoms.  A worsening CAT score could be explained by an exacerbation, poor medication adherence, poor inhaler technique, or progression of COPD or comorbid conditions.  CAT MCID is 2 points  mMRC: mMRC (Modified Medical Research Council) Dyspnea Scale is used to assess the degree of baseline functional disability in patients of respiratory disease due to dyspnea. No minimal important difference is established. A decrease in score of 1 point or greater is considered a positive change.   Pulmonary Function Assessment:  Pulmonary Function Assessment - 09/21/21 1154       Breath   Bilateral Breath Sounds Clear    Shortness of Breath Yes;Limiting activity             Exercise Target Goals: Exercise Program Goal: Individual exercise prescription set using results from initial 6 min walk test and THRR while considering  patients activity barriers and safety.   Exercise Prescription Goal: Initial exercise prescription builds to 30-45 minutes a day of aerobic activity, 2-3 days per week.  Home exercise guidelines will be given to patient during program as part of exercise prescription that the participant will acknowledge.  Activity Barriers & Risk Stratification:  Activity Barriers & Cardiac Risk Stratification - 09/21/21 1051       Activity Barriers & Cardiac Risk Stratification   Activity Barriers Right Knee Replacement;Shortness of Breath;Deconditioning;Arthritis    Cardiac Risk Stratification Moderate             6 Minute Walk:  6 Minute Walk     Row Name 09/22/21 0739         6 Minute Walk   Phase Initial     Distance 800 feet     Walk Time 6 minutes     # of Rest Breaks 1  3:30-4:30     MPH 1.52     METS 175     RPE 13     Perceived Dyspnea  2     VO2 Peak 6.14     Symptoms No     Resting HR 52  bpm     Resting BP 115/72     Resting Oxygen Saturation  95 %     Exercise Oxygen Saturation  during 6 min walk 83 %  Max Ex. HR 115 bpm     Max Ex. BP 120/72     2 Minute Post BP 120/70       Interval HR   1 Minute HR 58     2 Minute HR 91     3 Minute HR 114     4 Minute HR 115     5 Minute HR 112     6 Minute HR 112     2 Minute Post HR 60     Interval Heart Rate? Yes       Interval Oxygen   Interval Oxygen? Yes     Baseline Oxygen Saturation % 95 %     1 Minute Oxygen Saturation % 92 %     1 Minute Liters of Oxygen 0 L     2 Minute Oxygen Saturation % 90 %     2 Minute Liters of Oxygen 0 L     3 Minute Oxygen Saturation % 87 %     3 Minute Liters of Oxygen 0 L     4 Minute Oxygen Saturation % 83 %     4 Minute Liters of Oxygen 0 L  Increased to 1L     5 Minute Oxygen Saturation % 94 %     5 Minute Liters of Oxygen 1 L     6 Minute Oxygen Saturation % 89 %     6 Minute Liters of Oxygen 1 L     2 Minute Post Oxygen Saturation % 93 %     2 Minute Post Liters of Oxygen 1 L              Oxygen Initial Assessment:  Oxygen Initial Assessment - 09/22/21 0737       Home Oxygen   Home Oxygen Device None    Sleep Oxygen Prescription None    Home Exercise Oxygen Prescription None    Home Resting Oxygen Prescription None      Initial 6 min Walk   Oxygen Used Continuous    Liters per minute 1      Program Oxygen Prescription   Program Oxygen Prescription Continuous    Liters per minute 1    Comments O2 sat 83% RA after 4 min. O2 sat 89% on 1L      Intervention   Short Term Goals To learn and exhibit compliance with exercise, home and travel O2 prescription;To learn and understand importance of monitoring SPO2 with pulse oximeter and demonstrate accurate use of the pulse oximeter.;To learn and understand importance of maintaining oxygen saturations>88%;To learn and demonstrate proper pursed lip breathing techniques or other breathing techniques. ;To learn and  demonstrate proper use of respiratory medications    Long  Term Goals Exhibits compliance with exercise, home  and travel O2 prescription;Verbalizes importance of monitoring SPO2 with pulse oximeter and return demonstration;Maintenance of O2 saturations>88%;Exhibits proper breathing techniques, such as pursed lip breathing or other method taught during program session;Compliance with respiratory medication;Demonstrates proper use of MDIs             Oxygen Re-Evaluation:   Oxygen Discharge (Final Oxygen Re-Evaluation):   Initial Exercise Prescription:  Initial Exercise Prescription - 09/22/21 0700       Date of Initial Exercise RX and Referring Provider   Date 09/22/21    Referring Provider Mannam    Expected Discharge Date 11/26/21      Oxygen   Oxygen Continuous    Liters 1    Maintain  Oxygen Saturation 88% or higher      NuStep   Level 1    SPM 70    Minutes 15      Track   Minutes 15    METs 1.75      Prescription Details   Frequency (times per week) 2    Duration Progress to 30 minutes of continuous aerobic without signs/symptoms of physical distress      Intensity   THRR 40-80% of Max Heartrate 58-115    Ratings of Perceived Exertion 11-13    Perceived Dyspnea 0-4      Progression   Progression Continue to progress workloads to maintain intensity without signs/symptoms of physical distress.      Resistance Training   Training Prescription Yes    Weight blue bands    Reps 10-15             Perform Capillary Blood Glucose checks as needed.  Exercise Prescription Changes:   Exercise Comments:   Exercise Goals and Review:   Exercise Goals     Row Name 09/21/21 1052             Exercise Goals   Increase Physical Activity Yes       Intervention Provide advice, education, support and counseling about physical activity/exercise needs.;Develop an individualized exercise prescription for aerobic and resistive training based on initial  evaluation findings, risk stratification, comorbidities and participant's personal goals.       Expected Outcomes Short Term: Attend rehab on a regular basis to increase amount of physical activity.;Long Term: Add in home exercise to make exercise part of routine and to increase amount of physical activity.;Long Term: Exercising regularly at least 3-5 days a week.       Increase Strength and Stamina Yes       Intervention Provide advice, education, support and counseling about physical activity/exercise needs.;Develop an individualized exercise prescription for aerobic and resistive training based on initial evaluation findings, risk stratification, comorbidities and participant's personal goals.       Expected Outcomes Short Term: Increase workloads from initial exercise prescription for resistance, speed, and METs.;Short Term: Perform resistance training exercises routinely during rehab and add in resistance training at home;Long Term: Improve cardiorespiratory fitness, muscular endurance and strength as measured by increased METs and functional capacity (6MWT)       Able to understand and use rate of perceived exertion (RPE) scale Yes       Intervention Provide education and explanation on how to use RPE scale       Expected Outcomes Short Term: Able to use RPE daily in rehab to express subjective intensity level;Long Term:  Able to use RPE to guide intensity level when exercising independently       Able to understand and use Dyspnea scale Yes       Intervention Provide education and explanation on how to use Dyspnea scale       Expected Outcomes Short Term: Able to use Dyspnea scale daily in rehab to express subjective sense of shortness of breath during exertion;Long Term: Able to use Dyspnea scale to guide intensity level when exercising independently       Knowledge and understanding of Target Heart Rate Range (THRR) Yes       Intervention Provide education and explanation of THRR including how  the numbers were predicted and where they are located for reference       Expected Outcomes Short Term: Able to state/look up THRR;Long Term: Able to  use THRR to govern intensity when exercising independently;Short Term: Able to use daily as guideline for intensity in rehab       Understanding of Exercise Prescription Yes       Intervention Provide education, explanation, and written materials on patient's individual exercise prescription       Expected Outcomes Short Term: Able to explain program exercise prescription;Long Term: Able to explain home exercise prescription to exercise independently                Exercise Goals Re-Evaluation :   Discharge Exercise Prescription (Final Exercise Prescription Changes):   Nutrition:  Target Goals: Understanding of nutrition guidelines, daily intake of sodium 1500mg , cholesterol 200mg , calories 30% from fat and 7% or less from saturated fats, daily to have 5 or more servings of fruits and vegetables.  Biometrics:  Pre Biometrics - 09/21/21 1155       Pre Biometrics   Grip Strength 43 kg              Nutrition Therapy Plan and Nutrition Goals:   Nutrition Assessments:  MEDIFICTS Score Key: ?70 Need to make dietary changes  40-70 Heart Healthy Diet ? 40 Therapeutic Level Cholesterol Diet   Picture Your Plate Scores: <78 Unhealthy dietary pattern with much room for improvement. 41-50 Dietary pattern unlikely to meet recommendations for good health and room for improvement. 51-60 More healthful dietary pattern, with some room for improvement.  >60 Healthy dietary pattern, although there may be some specific behaviors that could be improved.    Nutrition Goals Re-Evaluation:   Nutrition Goals Discharge (Final Nutrition Goals Re-Evaluation):   Psychosocial: Target Goals: Acknowledge presence or absence of significant depression and/or stress, maximize coping skills, provide positive support system. Participant is able  to verbalize types and ability to use techniques and skills needed for reducing stress and depression.  Initial Review & Psychosocial Screening:  Initial Psych Review & Screening - 09/21/21 1103       Initial Review   Current issues with None Identified      Family Dynamics   Good Support System? Yes    Comments wife and children      Barriers   Psychosocial barriers to participate in program There are no identifiable barriers or psychosocial needs.      Screening Interventions   Interventions Encouraged to exercise             Quality of Life Scores:  Scores of 19 and below usually indicate a poorer quality of life in these areas.  A difference of  2-3 points is a clinically meaningful difference.  A difference of 2-3 points in the total score of the Quality of Life Index has been associated with significant improvement in overall quality of life, self-image, physical symptoms, and general health in studies assessing change in quality of life.  PHQ-9: Recent Review Flowsheet Data     Depression screen [facility] 2/9 09/21/2021 05/23/2021 12/25/2020 08/26/2020 04/26/2019   Decreased Interest 0 0 0 0 0   Down, Depressed, Hopeless 0 0 0 0 0   PHQ - 2 Score 0 0 0 0 0   Altered sleeping 0 - - - -   Tired, decreased energy 0 - - - -   Change in appetite 0 - - - -   Feeling bad or failure about yourself  0 - - - -   Trouble concentrating 0 - - - -   Moving slowly or fidgety/restless 0 - - - -  Suicidal thoughts 0 - - - -   PHQ-9 Score 0 - - - -   Difficult doing work/chores Not difficult at all - - - -      Interpretation of Total Score  Total Score Depression Severity:  1-4 = Minimal depression, 5-9 = Mild depression, 10-14 = Moderate depression, 15-19 = Moderately severe depression, 20-27 = Severe depression   Psychosocial Evaluation and Intervention:  Psychosocial Evaluation - 09/21/21 1103       Psychosocial Evaluation & Interventions   Interventions Encouraged to  exercise with the program and follow exercise prescription             Psychosocial Re-Evaluation:   Psychosocial Discharge (Final Psychosocial Re-Evaluation):   Education: Education Goals: Education classes will be provided on a weekly basis, covering required topics. Participant will state understanding/return demonstration of topics presented.  Learning Barriers/Preferences:   Education Topics: Risk Factor Reduction:  -Group instruction that is supported by a PowerPoint presentation. Instructor discusses the definition of a risk factor, different risk factors for pulmonary disease, and how the heart and lungs work together.     Nutrition for Pulmonary Patient:  -Group instruction provided by PowerPoint slides, verbal discussion, and written materials to support subject matter. The instructor gives an explanation and review of healthy diet recommendations, which includes a discussion on weight management, recommendations for fruit and vegetable consumption, as well as protein, fluid, caffeine, fiber, sodium, sugar, and alcohol. Tips for eating when patients are short of breath are discussed.   Pursed Lip Breathing:  -Group instruction that is supported by demonstration and informational handouts. Instructor discusses the benefits of pursed lip and diaphragmatic breathing and detailed demonstration on how to preform both.     Oxygen Safety:  -Group instruction provided by PowerPoint, verbal discussion, and written material to support subject matter. There is an overview of What is Oxygen and Why do we need it.  Instructor also reviews how to create a safe environment for oxygen use, the importance of using oxygen as prescribed, and the risks of noncompliance. There is a brief discussion on traveling with oxygen and resources the patient may utilize.   Oxygen Equipment:  -Group instruction provided by [facility] Staff utilizing handouts, written materials, and equipment  demonstrations.   Signs and Symptoms:  -Group instruction provided by written material and verbal discussion to support subject matter. Warning signs and symptoms of infection, stroke, and heart attack are reviewed and when to call the physician/911 reinforced. Tips for preventing the spread of infection discussed.   Advanced Directives:  -Group instruction provided by verbal instruction and written material to support subject matter. Instructor reviews Advanced Directive laws and proper instruction for filling out document.   Pulmonary Video:  -Group video education that reviews the importance of medication and oxygen compliance, exercise, good nutrition, pulmonary hygiene, and pursed lip and diaphragmatic breathing for the pulmonary patient.   Exercise for the Pulmonary Patient:  -Group instruction that is supported by a PowerPoint presentation. Instructor discusses benefits of exercise, core components of exercise, frequency, duration, and intensity of an exercise routine, importance of utilizing pulse oximetry during exercise, safety while exercising, and options of places to exercise outside of rehab.     Pulmonary Medications:  -Verbally interactive group education provided by instructor with focus on inhaled medications and proper administration.   Anatomy and Physiology of the Respiratory System and Intimacy:  -Group instruction provided by PowerPoint, verbal discussion, and written material to support subject matter. Instructor reviews respiratory  cycle and anatomical components of the respiratory system and their functions. Instructor also reviews differences in obstructive and restrictive respiratory diseases with examples of each. Intimacy, Sex, and Sexuality differences are reviewed with a discussion on how relationships can change when diagnosed with pulmonary disease. Common sexual concerns are reviewed.   MD DAY -A group question and answer session with a medical doctor  that allows participants to ask questions that relate to their pulmonary disease state.   OTHER EDUCATION -Group or individual verbal, written, or video instructions that support the educational goals of the pulmonary rehab program.   Holiday Eating Survival Tips:  -Group instruction provided by PowerPoint slides, verbal discussion, and written materials to support subject matter. The instructor gives patients tips, tricks, and techniques to help them not only survive but enjoy the holidays despite the onslaught of food that accompanies the holidays.   Knowledge Questionnaire Score:  Knowledge Questionnaire Score - 09/21/21 1108       Knowledge Questionnaire Score   Pre Score 17/18             Core Components/Risk Factors/Patient Goals at Admission:  Personal Goals and Risk Factors at Admission - 09/21/21 1104       Core Components/Risk Factors/Patient Goals on Admission   Improve shortness of breath with ADL's Yes    Intervention Provide education, individualized exercise plan and daily activity instruction to help decrease symptoms of SOB with activities of daily living.    Expected Outcomes Short Term: Improve cardiorespiratory fitness to achieve a reduction of symptoms when performing ADLs;Long Term: Be able to perform more ADLs without symptoms or delay the onset of symptoms             Core Components/Risk Factors/Patient Goals Review:    Core Components/Risk Factors/Patient Goals at Discharge (Final Review):    ITP Comments:  Dr. Rodman Pickle is Medical Director for Pulmonary Rehab at [facility].

## 2021-09-29 ENCOUNTER — Other Ambulatory Visit: Payer: Self-pay

## 2021-09-29 ENCOUNTER — Encounter (HOSPITAL_COMMUNITY)
Admission: RE | Admit: 2021-09-29 | Discharge: 2021-09-29 | Disposition: A | Discharge status: Home / Self Care | Payer: Medicare Other | Source: Ambulatory Visit | Attending: Pulmonary Disease | Admitting: Pulmonary Disease

## 2021-09-29 VITALS — Wt 173.5 lb

## 2021-09-29 DIAGNOSIS — J849 Interstitial pulmonary disease, unspecified: Secondary | ICD-10-CM

## 2021-09-29 LAB — GLUCOSE, CAPILLARY
Glucose-Capillary: 107 mg/dL — ABNORMAL HIGH (ref 70–99)
Glucose-Capillary: 100 mg/dL — ABNORMAL HIGH (ref 70–99)

## 2021-09-29 NOTE — Progress Notes (Signed)
Daily Session Note  Patient Details  Name: Todd Parsons MRN: [id number] Date of Birth: 1946/02/03 Referring Provider:   Flowsheet Row PULMONARY REHAB OTHER RESP ORIENTATION from 09/21/2021 in [facility]ARDI[facility]  Referring Provider Mannam       Encounter Date: 09/29/2021  Check In:  Session Check In - 09/29/21 1210       Check-In   Supervising physician immediately available to respond to emergencies [facility] immediately available    Physician(s) Dr. Marylu Lund    Location [facility]    Staff Present Raford Pitcher, MS, ACSM-CEP, Exercise Physiologist;Lisa Kizzie Bane, Wandra Mannan, RN, MHA;Carlette Les Pou, RN, Zachery Conch, MS, ACSM CEP, Exercise Physiologist;David Manus Gunning, MS, ACSM-CEP, CCRP, Exercise Physiologist    Virtual Visit No    Medication changes reported     No    Fall or balance concerns reported    No    Tobacco Cessation No Change    Warm-up and Cool-down Performed as group-led instruction    Resistance Training Performed Yes    VAD Patient? No    PAD/SET Patient? No      Pain Assessment   Currently in Pain? No/denies    Multiple Pain Sites No             Capillary Blood Glucose: Results for orders placed or performed during the hospital encounter of 09/29/21 (from the past 24 hour(s))  Glucose, capillary     Status: Abnormal   Collection Time: 09/29/21 10:46 AM  Result Value Ref Range   Glucose-Capillary 107 (H) 70 - 99 mg/dL  Glucose, capillary     Status: Abnormal   Collection Time: 09/29/21 11:41 AM  Result Value Ref Range   Glucose-Capillary 100 (H) 70 - 99 mg/dL     Exercise Prescription Changes - 09/29/21 1200       Response to Exercise   Blood Pressure (Admit) 110/80    Blood Pressure (Exercise) 112/62    Blood Pressure (Exit) 94/60    Heart Rate (Admit) 54 bpm    Heart Rate (Exercise) 62 bpm    Heart Rate (Exit) 61 bpm    Oxygen Saturation (Admit) 96 %    Oxygen Saturation  (Exercise) 95 %    Oxygen Saturation (Exit) 98 %    Rating of Perceived Exertion (Exercise) 11    Perceived Dyspnea (Exercise) 1    Duration Progress to 30 minutes of  aerobic without signs/symptoms of physical distress    Intensity THRR unchanged      Progression   Progression Continue to progress workloads to maintain intensity without signs/symptoms of physical distress.      Resistance Training   Training Prescription Yes    Weight blue bands    Reps 10-15    Time 10 Minutes      Oxygen   Oxygen Continuous    Liters 1      NuStep   Level 1    SPM 70    Minutes 15    METs 1.7      Track   Laps 16    Minutes 15    METs 2.86      Oxygen   Maintain Oxygen Saturation 88% or higher             Social History   Tobacco Use  Smoking Status Former   Years: 17.00   Types: Cigarettes   Quit date: 04/24/1978   Years since quitting: 43.4  Smokeless Tobacco Never  Goals Met:  Proper associated with RPD/PD & O2 Sat Exercise tolerated well No report of concerns or symptoms today Strength training completed today  Goals Unmet:  Not Applicable  Comments: Service time is from 1022 to 1142.    Dr. Rodman Pickle is Medical Director for Pulmonary Rehab at [facility].

## 2021-10-01 ENCOUNTER — Other Ambulatory Visit: Payer: Self-pay

## 2021-10-01 ENCOUNTER — Encounter (HOSPITAL_COMMUNITY)
Admission: RE | Admit: 2021-10-01 | Discharge: 2021-10-01 | Disposition: A | Discharge status: Home / Self Care | Payer: Medicare Other | Source: Ambulatory Visit | Attending: Pulmonary Disease | Admitting: Pulmonary Disease

## 2021-10-01 DIAGNOSIS — J849 Interstitial pulmonary disease, unspecified: Secondary | ICD-10-CM

## 2021-10-01 LAB — GLUCOSE, CAPILLARY
Glucose-Capillary: 124 mg/dL — ABNORMAL HIGH (ref 70–99)
Glucose-Capillary: 86 mg/dL (ref 70–99)

## 2021-10-01 NOTE — Progress Notes (Signed)
Daily Session Note  Patient Details  Name: Todd Parsons MRN: [id number] Date of Birth: June 27, 1946 Referring Provider:   Flowsheet Row PULMONARY REHAB OTHER RESP ORIENTATION from 09/21/2021 in [place]  Referring Provider Mannam       Encounter Date: 10/01/2021  Check In:  Session Check In - 10/01/21 1213       Check-In   Supervising physician immediately available to respond to emergencies [facility] immediately available    Physician(s) Dr. Avon Gully    Location [facility]    Staff Present Elmon Else, MS, ACSM-CEP, Exercise Physiologist;Jetta Gilford Rile BS, ACSM EP-C, Exercise Physiologist;Lisa Ysidro Evert, RN    Virtual Visit No    Medication changes reported     No    Fall or balance concerns reported    No    Tobacco Cessation No Change    Warm-up and Cool-down Performed as group-led instruction    Resistance Training Performed Yes    VAD Patient? No    PAD/SET Patient? No      Pain Assessment   Currently in Pain? No/denies    Multiple Pain Sites No             Capillary Blood Glucose: Results for orders placed or performed during the hospital encounter of 10/01/21 (from the past 24 hour(s))  Glucose, capillary     Status: None   Collection Time: 10/01/21 11:30 AM  Result Value Ref Range   Glucose-Capillary 86 70 - 99 mg/dL      Social History   Tobacco Use  Smoking Status Former   Years: 17.00   Types: Cigarettes   Quit date: 04/24/1978   Years since quitting: 43.4  Smokeless Tobacco Never    Goals Met:  Proper associated with RPD/PD & O2 Sat Exercise tolerated well No report of concerns or symptoms today Strength training completed today  Goals Unmet:  Not Applicable  Comments: Service time is from 1017 to 1140.    Dr. Rodman Pickle is Medical Director for Pulmonary Rehab at [facility].

## 2021-10-06 ENCOUNTER — Other Ambulatory Visit: Payer: Self-pay

## 2021-10-06 ENCOUNTER — Encounter (HOSPITAL_COMMUNITY)
Admission: RE | Admit: 2021-10-06 | Discharge: 2021-10-06 | Disposition: A | Discharge status: Home / Self Care | Payer: Medicare Other | Source: Ambulatory Visit | Attending: Pulmonary Disease | Admitting: Pulmonary Disease

## 2021-10-06 DIAGNOSIS — J849 Interstitial pulmonary disease, unspecified: Secondary | ICD-10-CM

## 2021-10-06 NOTE — Progress Notes (Signed)
Daily Session Note  Patient Details  Name: Todd Parsons MRN: [id number] Date of Birth: 1945/09/09 Referring Provider:   Flowsheet Row PULMONARY REHAB OTHER RESP ORIENTATION from 09/21/2021 in [facility]  Referring Provider Mannam       Encounter Date: 10/06/2021  Check In:  Session Check In - 10/06/21 1122       Check-In   Supervising physician immediately available to respond to emergencies [facility] immediately available    Physician(s) Dr. Avon Gully    Location [facility]    Staff Present Rosebud Poles, RN, BSN;Carlette Wilber Oliphant, RN, Quentin Ore, MS, ACSM-CEP, Exercise Physiologist;Annedrea Rosezella [place], RN, [facility]    Virtual Visit No    Medication changes reported     No    Fall or balance concerns reported    No    Tobacco Cessation No Change    Warm-up and Cool-down Performed as group-led instruction    Resistance Training Performed Yes    VAD Patient? No    PAD/SET Patient? No      Pain Assessment   Currently in Pain? No/denies    Multiple Pain Sites No             Capillary Blood Glucose: No results found for this or any previous visit (from the past 24 hour(s)).    Social History   Tobacco Use  Smoking Status Former   Years: 17.00   Types: Cigarettes   Quit date: 04/24/1978   Years since quitting: 43.4  Smokeless Tobacco Never    Goals Met:  Proper associated with RPD/PD & O2 Sat Exercise tolerated well No report of concerns or symptoms today Strength training completed today  Goals Unmet:  Not Applicable  Comments: Service time is from [place] to [place]    Dr. Rodman Pickle is Medical Director for Pulmonary Rehab at [facility].

## 2021-10-07 NOTE — Progress Notes (Signed)
Pulmonary Individual Treatment Plan  Patient Details  Name: Todd Parsons MRN: [id number] Date of Birth: May 08, 1946 Referring Provider:   Flowsheet Row PULMONARY REHAB OTHER RESP ORIENTATION from 09/21/2021 in [facility]  Referring Provider Mannam       Initial Encounter Date:  Flowsheet Row PULMONARY REHAB OTHER RESP ORIENTATION from 09/21/2021 in [facility]  Date 09/22/21       Visit Diagnosis: ILD (interstitial lung disease) ([place])  Patient's Home Medications on Admission:   Current Outpatient Medications:    amLODipine (NORVASC) 5 MG tablet, Take 10 mg by mouth., Disp: , Rfl:    aspirin EC 81 MG tablet, Take 81 mg by mouth daily., Disp: , Rfl:    atorvastatin (LIPITOR) 40 MG tablet, TAKE 1 TABLET BY MOUTH AT  BEDTIME, Disp: 90 tablet, Rfl: 3   cetirizine (ZYRTEC) 10 MG tablet, Take 10 mg by mouth as needed for allergies., Disp: , Rfl:    fenofibrate 160 MG tablet, TAKE 1 TABLET BY MOUTH  DAILY, Disp: 90 tablet, Rfl: 3   finasteride (PROSCAR) 5 MG tablet, TAKE 1 TABLET BY MOUTH  DAILY, Disp: 90 tablet, Rfl: 3   FLUoxetine (PROZAC) 20 MG capsule, TAKE 1 CAPSULE BY MOUTH  DAILY, Disp: 90 capsule, Rfl: 3   fluticasone (FLONASE) 50 MCG/ACT nasal spray, Place 2 sprays into both nostrils daily as needed for allergies or rhinitis., Disp: 48 g, Rfl: 3   metFORMIN (GLUCOPHAGE-XR) 500 MG 24 hr tablet, Take 1 tablet (500 mg total) by mouth daily with breakfast., Disp: 1 tablet, Rfl: 0   metoprolol succinate (TOPROL-XL) 50 MG 24 hr tablet, Take 1 tablet (50 mg total) by mouth daily. Take with or immediately following a meal., Disp: 90 tablet, Rfl: 1   PFIZER-BIONT COVID-19 VAC-TRIS SUSP injection, , Disp: , Rfl:    Pirfenidone (ESBRIET) 801 MG TABS, Take 801 mg by mouth with breakfast, with lunch, and with evening meal., Disp: 90 tablet, Rfl: 5   solifenacin (VESICARE) 5 MG tablet, Take 5 mg by mouth daily., Disp: , Rfl:     tamsulosin (FLOMAX) 0.4 MG CAPS capsule, Take 1 capsule (0.4 mg total) by mouth daily after supper., Disp: 90 capsule, Rfl: 3  Past Medical History: Past Medical History:  Diagnosis Date   Arthritis    osteoarthritis   Bicuspid aortic valve    Cataract    Coronary artery disease    Dysrhythmia    atrial flutter   Echocardiogram with ECG monitoring 01/12/2010   60-65% EF   Hyperlipidemia    Hypertension    Pre-diabetes     Tobacco Use: Social History   Tobacco Use  Smoking Status Former   Years: 17.00   Types: Cigarettes   Quit date: 04/24/1978   Years since quitting: 43.4  Smokeless Tobacco Never    Labs: Recent Review Flowsheet Data     Labs for ITP Cardiac and Pulmonary Rehab Latest Ref Rng & Units 09/06/2019 12/31/2019 04/25/2020 08/26/2020 12/25/2020   Cholestrol 100 - 199 mg/dL - 118 - - -   LDLCALC 0 - 99 mg/dL - 61 - - -   HDL >39 mg/dL - 34(L) - - -   Trlycerides 0 - 149 mg/dL - 126 - - -   Hemoglobin A1c 4.8 - 5.6 % 6.0(A) 5.9(A) 5.7(A) 5.6 6.0(H)       Capillary Blood Glucose: Lab Results  Component Value Date   GLUCAP 86 10/01/2021   GLUCAP 124 (  H) 10/01/2021   GLUCAP 100 (H) 09/29/2021   GLUCAP 107 (H) 09/29/2021   GLUCAP 147 (H) 03/02/2017    POCT Glucose     Row Name 09/29/21 1507             POCT Blood Glucose   Pre-Exercise 107 mg/dL       Post-Exercise 100 mg/dL                Pulmonary Assessment Scores:  Pulmonary Assessment Scores     Row Name 09/21/21 1109         ADL UCSD   ADL Phase Entry     SOB Score total 11       CAT Score   CAT Score 9       mMRC Score   mMRC Score 1             UCSD: Self-administered rating of dyspnea associated with activities of daily living (ADLs) 6-point scale (0 = "not at all" to 5 = "maximal or unable to do because of breathlessness")  Scoring Scores range from 0 to 120.  Minimally important difference is 5 units  CAT: CAT can identify the health impairment of COPD  patients and is better correlated with disease progression.  CAT has a scoring range of zero to 40. The CAT score is classified into four groups of low (less than 10), medium (10 - 20), high (21-30) and very high (31-40) based on the impact level of disease on health status. A CAT score over 10 suggests significant symptoms.  A worsening CAT score could be explained by an exacerbation, poor medication adherence, poor inhaler technique, or progression of COPD or comorbid conditions.  CAT MCID is 2 points  mMRC: mMRC (Modified Medical Research Council) Dyspnea Scale is used to assess the degree of baseline functional disability in patients of respiratory disease due to dyspnea. No minimal important difference is established. A decrease in score of 1 point or greater is considered a positive change.   Pulmonary Function Assessment:  Pulmonary Function Assessment - 09/21/21 1154       Breath   Bilateral Breath Sounds Clear    Shortness of Breath Yes;Limiting activity             Exercise Target Goals: Exercise Program Goal: Individual exercise prescription set using results from initial 6 min walk test and THRR while considering  patients activity barriers and safety.   Exercise Prescription Goal: Initial exercise prescription builds to 30-45 minutes a day of aerobic activity, 2-3 days per week.  Home exercise guidelines will be given to patient during program as part of exercise prescription that the participant will acknowledge.  Activity Barriers & Risk Stratification:  Activity Barriers & Cardiac Risk Stratification - 09/21/21 1051       Activity Barriers & Cardiac Risk Stratification   Activity Barriers Right Knee Replacement;Shortness of Breath;Deconditioning;Arthritis    Cardiac Risk Stratification Moderate             6 Minute Walk:  6 Minute Walk     Row Name 09/22/21 0739         6 Minute Walk   Phase Initial     Distance 800 feet     Walk Time 6 minutes      # of Rest Breaks 1  3:30-4:30     MPH 1.52     METS 175     RPE 13     Perceived Dyspnea  2  VO2 Peak 6.14     Symptoms No     Resting HR 52 bpm     Resting BP 115/72     Resting Oxygen Saturation  95 %     Exercise Oxygen Saturation  during 6 min walk 83 %     Max Ex. HR 115 bpm     Max Ex. BP 120/72     2 Minute Post BP 120/70       Interval HR   1 Minute HR 58     2 Minute HR 91     3 Minute HR 114     4 Minute HR 115     5 Minute HR 112     6 Minute HR 112     2 Minute Post HR 60     Interval Heart Rate? Yes       Interval Oxygen   Interval Oxygen? Yes     Baseline Oxygen Saturation % 95 %     1 Minute Oxygen Saturation % 92 %     1 Minute Liters of Oxygen 0 L     2 Minute Oxygen Saturation % 90 %     2 Minute Liters of Oxygen 0 L     3 Minute Oxygen Saturation % 87 %     3 Minute Liters of Oxygen 0 L     4 Minute Oxygen Saturation % 83 %     4 Minute Liters of Oxygen 0 L  Increased to 1L     5 Minute Oxygen Saturation % 94 %     5 Minute Liters of Oxygen 1 L     6 Minute Oxygen Saturation % 89 %     6 Minute Liters of Oxygen 1 L     2 Minute Post Oxygen Saturation % 93 %     2 Minute Post Liters of Oxygen 1 L              Oxygen Initial Assessment:  Oxygen Initial Assessment - 09/22/21 0737       Home Oxygen   Home Oxygen Device None    Sleep Oxygen Prescription None    Home Exercise Oxygen Prescription None    Home Resting Oxygen Prescription None      Initial 6 min Walk   Oxygen Used Continuous    Liters per minute 1      Program Oxygen Prescription   Program Oxygen Prescription Continuous    Liters per minute 1    Comments O2 sat 83% RA after 4 min. O2 sat 89% on 1L      Intervention   Short Term Goals To learn and exhibit compliance with exercise, home and travel O2 prescription;To learn and understand importance of monitoring SPO2 with pulse oximeter and demonstrate accurate use of the pulse oximeter.;To learn and understand  importance of maintaining oxygen saturations>88%;To learn and demonstrate proper pursed lip breathing techniques or other breathing techniques. ;To learn and demonstrate proper use of respiratory medications    Long  Term Goals Exhibits compliance with exercise, home  and travel O2 prescription;Verbalizes importance of monitoring SPO2 with pulse oximeter and return demonstration;Maintenance of O2 saturations>88%;Exhibits proper breathing techniques, such as pursed lip breathing or other method taught during program session;Compliance with respiratory medication;Demonstrates proper use of MDIs             Oxygen Re-Evaluation:  Oxygen Re-Evaluation     Row Name 09/30/21 [id number]  Program Oxygen Prescription   Program Oxygen Prescription Continuous       Liters per minute 1         Home Oxygen   Home Oxygen Device None       Sleep Oxygen Prescription None       Home Exercise Oxygen Prescription None       Home Resting Oxygen Prescription None         Goals/Expected Outcomes   Short Term Goals To learn and exhibit compliance with exercise, home and travel O2 prescription;To learn and understand importance of monitoring SPO2 with pulse oximeter and demonstrate accurate use of the pulse oximeter.;To learn and understand importance of maintaining oxygen saturations>88%;To learn and demonstrate proper pursed lip breathing techniques or other breathing techniques. ;To learn and demonstrate proper use of respiratory medications       Long  Term Goals Exhibits compliance with exercise, home  and travel O2 prescription;Verbalizes importance of monitoring SPO2 with pulse oximeter and return demonstration;Maintenance of O2 saturations>88%;Exhibits proper breathing techniques, such as pursed lip breathing or other method taught during program session;Compliance with respiratory medication;Demonstrates proper use of MDIs       Goals/Expected Outcomes Compliance and understanding of oxygen  saturation and breathing techniques to decrease shortness of breath.                Oxygen Discharge (Final Oxygen Re-Evaluation):  Oxygen Re-Evaluation - 09/30/21 0920       Program Oxygen Prescription   Program Oxygen Prescription Continuous    Liters per minute 1      Home Oxygen   Home Oxygen Device None    Sleep Oxygen Prescription None    Home Exercise Oxygen Prescription None    Home Resting Oxygen Prescription None      Goals/Expected Outcomes   Short Term Goals To learn and exhibit compliance with exercise, home and travel O2 prescription;To learn and understand importance of monitoring SPO2 with pulse oximeter and demonstrate accurate use of the pulse oximeter.;To learn and understand importance of maintaining oxygen saturations>88%;To learn and demonstrate proper pursed lip breathing techniques or other breathing techniques. ;To learn and demonstrate proper use of respiratory medications    Long  Term Goals Exhibits compliance with exercise, home  and travel O2 prescription;Verbalizes importance of monitoring SPO2 with pulse oximeter and return demonstration;Maintenance of O2 saturations>88%;Exhibits proper breathing techniques, such as pursed lip breathing or other method taught during program session;Compliance with respiratory medication;Demonstrates proper use of MDIs    Goals/Expected Outcomes Compliance and understanding of oxygen saturation and breathing techniques to decrease shortness of breath.             Initial Exercise Prescription:  Initial Exercise Prescription - 09/22/21 0700       Date of Initial Exercise RX and Referring Provider   Date 09/22/21    Referring Provider Mannam    Expected Discharge Date 11/26/21      Oxygen   Oxygen Continuous    Liters 1    Maintain Oxygen Saturation 88% or higher      NuStep   Level 1    SPM 70    Minutes 15      Track   Minutes 15    METs 1.75      Prescription Details   Frequency (times per  week) 2    Duration Progress to 30 minutes of continuous aerobic without signs/symptoms of physical distress      Intensity   THRR 40-80% of Max  Heartrate 58-115    Ratings of Perceived Exertion 11-13    Perceived Dyspnea 0-4      Progression   Progression Continue to progress workloads to maintain intensity without signs/symptoms of physical distress.      Resistance Training   Training Prescription Yes    Weight blue bands    Reps 10-15             Perform Capillary Blood Glucose checks as needed.  Exercise Prescription Changes:   Exercise Prescription Changes     Row Name 09/29/21 1200             Response to Exercise   Blood Pressure (Admit) 110/80       Blood Pressure (Exercise) 112/62       Blood Pressure (Exit) 94/60       Heart Rate (Admit) 54 bpm       Heart Rate (Exercise) 62 bpm       Heart Rate (Exit) 61 bpm       Oxygen Saturation (Admit) 96 %       Oxygen Saturation (Exercise) 95 %       Oxygen Saturation (Exit) 98 %       Rating of Perceived Exertion (Exercise) 11       Perceived Dyspnea (Exercise) 1       Duration Progress to 30 minutes of  aerobic without signs/symptoms of physical distress       Intensity THRR unchanged         Progression   Progression Continue to progress workloads to maintain intensity without signs/symptoms of physical distress.         Resistance Training   Training Prescription Yes       Weight blue bands       Reps 10-15       Time 10 Minutes         Oxygen   Oxygen Continuous       Liters 1         NuStep   Level 1       SPM 70       Minutes 15       METs 1.7         Track   Laps 16       Minutes 15       METs 2.86         Oxygen   Maintain Oxygen Saturation 88% or higher                Exercise Comments:   Exercise Comments     Row Name 09/29/21 1226           Exercise Comments Pt completed 1st day of exercise. Todd Parsons exercised for 15 min on the Nustep and track. He averaged 1.7 METs at  level 1 on the Nustep and 2.86 METs on the track. He performed the warmup and cooldown standing without limitations.                Exercise Goals and Review:   Exercise Goals     Row Name 09/21/21 1052 09/30/21 0916           Exercise Goals   Increase Physical Activity Yes Yes      Intervention Provide advice, education, support and counseling about physical activity/exercise needs.;Develop an individualized exercise prescription for aerobic and resistive training based on initial evaluation findings, risk stratification, comorbidities and participant's personal goals. Provide advice, education, support and counseling  about physical activity/exercise needs.;Develop an individualized exercise prescription for aerobic and resistive training based on initial evaluation findings, risk stratification, comorbidities and participant's personal goals.      Expected Outcomes Short Term: Attend rehab on a regular basis to increase amount of physical activity.;Long Term: Add in home exercise to make exercise part of routine and to increase amount of physical activity.;Long Term: Exercising regularly at least 3-5 days a week. Short Term: Attend rehab on a regular basis to increase amount of physical activity.;Long Term: Add in home exercise to make exercise part of routine and to increase amount of physical activity.;Long Term: Exercising regularly at least 3-5 days a week.      Increase Strength and Stamina Yes Yes      Intervention Provide advice, education, support and counseling about physical activity/exercise needs.;Develop an individualized exercise prescription for aerobic and resistive training based on initial evaluation findings, risk stratification, comorbidities and participant's personal goals. Provide advice, education, support and counseling about physical activity/exercise needs.;Develop an individualized exercise prescription for aerobic and resistive training based on initial evaluation  findings, risk stratification, comorbidities and participant's personal goals.      Expected Outcomes Short Term: Increase workloads from initial exercise prescription for resistance, speed, and METs.;Short Term: Perform resistance training exercises routinely during rehab and add in resistance training at home;Long Term: Improve cardiorespiratory fitness, muscular endurance and strength as measured by increased METs and functional capacity (6MWT) Short Term: Increase workloads from initial exercise prescription for resistance, speed, and METs.;Short Term: Perform resistance training exercises routinely during rehab and add in resistance training at home;Long Term: Improve cardiorespiratory fitness, muscular endurance and strength as measured by increased METs and functional capacity (6MWT)      Able to understand and use rate of perceived exertion (RPE) scale Yes Yes      Intervention Provide education and explanation on how to use RPE scale Provide education and explanation on how to use RPE scale      Expected Outcomes Short Term: Able to use RPE daily in rehab to express subjective intensity level;Long Term:  Able to use RPE to guide intensity level when exercising independently Short Term: Able to use RPE daily in rehab to express subjective intensity level;Long Term:  Able to use RPE to guide intensity level when exercising independently      Able to understand and use Dyspnea scale Yes Yes      Intervention Provide education and explanation on how to use Dyspnea scale Provide education and explanation on how to use Dyspnea scale      Expected Outcomes Short Term: Able to use Dyspnea scale daily in rehab to express subjective sense of shortness of breath during exertion;Long Term: Able to use Dyspnea scale to guide intensity level when exercising independently Short Term: Able to use Dyspnea scale daily in rehab to express subjective sense of shortness of breath during exertion;Long Term: Able to use  Dyspnea scale to guide intensity level when exercising independently      Knowledge and understanding of Target Heart Rate Range (THRR) Yes Yes      Intervention Provide education and explanation of THRR including how the numbers were predicted and where they are located for reference Provide education and explanation of THRR including how the numbers were predicted and where they are located for reference      Expected Outcomes Short Term: Able to state/look up THRR;Long Term: Able to use THRR to govern intensity when exercising independently;Short Term: Able to use daily  as guideline for intensity in rehab Short Term: Able to state/look up THRR;Long Term: Able to use THRR to govern intensity when exercising independently;Short Term: Able to use daily as guideline for intensity in rehab      Understanding of Exercise Prescription Yes Yes      Intervention Provide education, explanation, and written materials on patient's individual exercise prescription Provide education, explanation, and written materials on patient's individual exercise prescription      Expected Outcomes Short Term: Able to explain program exercise prescription;Long Term: Able to explain home exercise prescription to exercise independently Short Term: Able to explain program exercise prescription;Long Term: Able to explain home exercise prescription to exercise independently               Exercise Goals Re-Evaluation :  Exercise Goals Re-Evaluation     Todd Parsons Name 09/30/21 0917             Exercise Goal Re-Evaluation   Exercise Goals Review Increase Physical Activity;Increase Strength and Stamina;Able to understand and use rate of perceived exertion (RPE) scale;Able to understand and use Dyspnea scale;Knowledge and understanding of Target Heart Rate Range (THRR);Understanding of Exercise Prescription       Comments Todd Parsons has completed 1 exercise session. He exercised for 15 min on the Nustep and track. He averaged 1.7 METs on  the Nustep and 2.86 METs on the track. Todd Parsons performed the warmup and cooldown standing without limitations. It is soon to note any discernable progressions. Will discuss METs and how to increase METs soon. Will continue to monitor and progress as able.       Expected Outcomes Through exercise at rehab and home, the patient will decrease shortness of breath with daily activities and feel confident in carrying out an exercise regimen at home.                Discharge Exercise Prescription (Final Exercise Prescription Changes):  Exercise Prescription Changes - 09/29/21 1200       Response to Exercise   Blood Pressure (Admit) 110/80    Blood Pressure (Exercise) 112/62    Blood Pressure (Exit) 94/60    Heart Rate (Admit) 54 bpm    Heart Rate (Exercise) 62 bpm    Heart Rate (Exit) 61 bpm    Oxygen Saturation (Admit) 96 %    Oxygen Saturation (Exercise) 95 %    Oxygen Saturation (Exit) 98 %    Rating of Perceived Exertion (Exercise) 11    Perceived Dyspnea (Exercise) 1    Duration Progress to 30 minutes of  aerobic without signs/symptoms of physical distress    Intensity THRR unchanged      Progression   Progression Continue to progress workloads to maintain intensity without signs/symptoms of physical distress.      Resistance Training   Training Prescription Yes    Weight blue bands    Reps 10-15    Time 10 Minutes      Oxygen   Oxygen Continuous    Liters 1      NuStep   Level 1    SPM 70    Minutes 15    METs 1.7      Track   Laps 16    Minutes 15    METs 2.86      Oxygen   Maintain Oxygen Saturation 88% or higher             Nutrition:  Target Goals: Understanding of nutrition guidelines, daily intake of sodium <1527m, cholesterol <  276m, calories 30% from fat and 7% or less from saturated fats, daily to have 5 or more servings of fruits and vegetables.  Biometrics:  Pre Biometrics - 09/21/21 1155       Pre Biometrics   Grip Strength 43 kg               Nutrition Therapy Plan and Nutrition Goals:   Nutrition Assessments:  MEDIFICTS Score Key: ?70 Need to make dietary changes  40-70 Heart Healthy Diet ? 40 Therapeutic Level Cholesterol Diet   Picture Your Plate Scores: <<91Unhealthy dietary pattern with much room for improvement. 41-50 Dietary pattern unlikely to meet recommendations for good health and room for improvement. 51-60 More healthful dietary pattern, with some room for improvement.  >60 Healthy dietary pattern, although there may be some specific behaviors that could be improved.    Nutrition Goals Re-Evaluation:   Nutrition Goals Discharge (Final Nutrition Goals Re-Evaluation):   Psychosocial: Target Goals: Acknowledge presence or absence of significant depression and/or stress, maximize coping skills, provide positive support system. Participant is able to verbalize types and ability to use techniques and skills needed for reducing stress and depression.  Initial Review & Psychosocial Screening:  Initial Psych Review & Screening - 09/21/21 1103       Initial Review   Current issues with None Identified      Family Dynamics   Good Support System? Yes    Comments wife and children      Barriers   Psychosocial barriers to participate in program There are no identifiable barriers or psychosocial needs.      Screening Interventions   Interventions Encouraged to exercise             Quality of Life Scores:  Scores of 19 and below usually indicate a poorer quality of life in these areas.  A difference of  2-3 points is a clinically meaningful difference.  A difference of 2-3 points in the total score of the Quality of Life Index has been associated with significant improvement in overall quality of life, self-image, physical symptoms, and general health in studies assessing change in quality of life.  PHQ-9: Recent Review Flowsheet Data     [facility] 09/21/2021 05/23/2021  12/25/2020 08/26/2020 04/26/2019   Decreased Interest 0 0 0 0 0   Down, Depressed, Hopeless 0 0 0 0 0   PHQ - 2 Score 0 0 0 0 0   Altered sleeping 0 - - - -   Tired, decreased energy 0 - - - -   Change in appetite 0 - - - -   Feeling bad or failure about yourself  0 - - - -   Trouble concentrating 0 - - - -   Moving slowly or fidgety/restless 0 - - - -   Suicidal thoughts 0 - - - -   PHQ-9 Score 0 - - - -   Difficult doing work/chores Not difficult at all - - - -      Interpretation of Total Score  Total Score Depression Severity:  1-4 = Minimal depression, 5-9 = Mild depression, 10-14 = Moderate depression, 15-19 = Moderately severe depression, 20-27 = Severe depression   Psychosocial Evaluation and Intervention:  Psychosocial Evaluation - 09/21/21 1103       Psychosocial Evaluation & Interventions   Interventions Encouraged to exercise with the program and follow exercise prescription             Psychosocial Re-Evaluation:  Psychosocial Re-Evaluation     [place] Name 10/07/21 1649             Psychosocial Re-Evaluation   Current issues with None Identified       Comments Todd Parsons continues to have no identified psychosocial barriers or issues. He is progressing slowly in  his exercise. He is on the nu step and track for exercise. He has attended 4 sessions       Expected Outcomes For Todd Parsons to remain free of any psychosocial barriers or concerns       Interventions Encouraged to attend Pulmonary Rehabilitation for the exercise       Continue Psychosocial Services  No Follow up required                Psychosocial Discharge (Final Psychosocial Re-Evaluation):  Psychosocial Re-Evaluation - 10/07/21 1649       Psychosocial Re-Evaluation   Current issues with None Identified    Comments Todd Parsons continues to have no identified psychosocial barriers or issues. He is progressing slowly in  his exercise. He is on the nu step and track for exercise. He has attended 4 sessions     Expected Outcomes For Todd Parsons to remain free of any psychosocial barriers or concerns    Interventions Encouraged to attend Pulmonary Rehabilitation for the exercise    Continue Psychosocial Services  No Follow up required             Education: Education Goals: Education classes will be provided on a weekly basis, covering required topics. Participant will state understanding/return demonstration of topics presented.  Learning Barriers/Preferences:   Education Topics: Risk Factor Reduction:  -Group instruction that is supported by a PowerPoint presentation. Instructor discusses the definition of a risk factor, different risk factors for pulmonary disease, and how the heart and lungs work together.     Nutrition for Pulmonary Patient:  -Group instruction provided by PowerPoint slides, verbal discussion, and written materials to support subject matter. The instructor gives an explanation and review of healthy diet recommendations, which includes a discussion on weight management, recommendations for fruit and vegetable consumption, as well as protein, fluid, caffeine, fiber, sodium, sugar, and alcohol. Tips for eating when patients are short of breath are discussed.   Pursed Lip Breathing:  -Group instruction that is supported by demonstration and informational handouts. Instructor discusses the benefits of pursed lip and diaphragmatic breathing and detailed demonstration on how to preform both.     Oxygen Safety:  -Group instruction provided by PowerPoint, verbal discussion, and written material to support subject matter. There is an overview of What is Oxygen and Why do we need it.  Instructor also reviews how to create a safe environment for oxygen use, the importance of using oxygen as prescribed, and the risks of noncompliance. There is a brief discussion on traveling with oxygen and resources the patient may utilize.   Oxygen Equipment:  -Group instruction provided by [facility] Staff utilizing handouts, written materials, and equipment demonstrations.   Signs and Symptoms:  -Group instruction provided by written material and verbal discussion to support subject matter. Warning signs and symptoms of infection, stroke, and heart attack are reviewed and when to call the physician/911 reinforced. Tips for preventing the spread of infection discussed.   Advanced Directives:  -Group instruction provided by verbal instruction and written material to support subject matter. Instructor reviews Advanced Directive laws and proper instruction for filling out document.   Pulmonary Video:  -Group video education that reviews the  importance of medication and oxygen compliance, exercise, good nutrition, pulmonary hygiene, and pursed lip and diaphragmatic breathing for the pulmonary patient.   Exercise for the Pulmonary Patient:  -Group instruction that is supported by a PowerPoint presentation. Instructor discusses benefits of exercise, core components of exercise, frequency, duration, and intensity of an exercise routine, importance of utilizing pulse oximetry during exercise, safety while exercising, and options of places to exercise outside of rehab.     Pulmonary Medications:  -Verbally interactive group education provided by instructor with focus on inhaled medications and proper administration.   Anatomy and Physiology of the Respiratory System and Intimacy:  -Group instruction provided by PowerPoint, verbal discussion, and written material to support subject matter. Instructor reviews respiratory cycle and anatomical components of the respiratory system and their functions. Instructor also reviews differences in obstructive and restrictive respiratory diseases with examples of each. Intimacy, Sex, and Sexuality differences are reviewed with a discussion on how relationships can change when diagnosed with pulmonary disease. Common sexual concerns are reviewed.   MD  DAY -A group question and answer session with a medical doctor that allows participants to ask questions that relate to their pulmonary disease state.   OTHER EDUCATION -Group or individual verbal, written, or video instructions that support the educational goals of the pulmonary rehab program. [place] from 10/01/2021 in [place]  Date 10/01/21  Educator Deanne Coffer your numbers]  Instruction Review Code 1- Verbalizes Understanding       Holiday Eating Survival Tips:  -Group instruction provided by PowerPoint slides, verbal discussion, and written materials to support subject matter. The instructor gives patients tips, tricks, and techniques to help them not only survive but enjoy the holidays despite the onslaught of food that accompanies the holidays.   Knowledge Questionnaire Score:  Knowledge Questionnaire Score - 09/21/21 1108       Knowledge Questionnaire Score   Pre Score 17/18             Core Components/Risk Factors/Patient Goals at Admission:  Personal Goals and Risk Factors at Admission - 09/21/21 1104       Core Components/Risk Factors/Patient Goals on Admission   Improve shortness of breath with ADL's Yes    Intervention Provide education, individualized exercise plan and daily activity instruction to help decrease symptoms of SOB with activities of daily living.    Expected Outcomes Short Term: Improve cardiorespiratory fitness to achieve a reduction of symptoms when performing ADLs;Long Term: Be able to perform more ADLs without symptoms or delay the onset of symptoms             Core Components/Risk Factors/Patient Goals Review:   Goals and Risk Factor Review     Row Name 10/07/21 1657             Core Components/Risk Factors/Patient Goals Review   Personal Goals Review Improve shortness of breath with ADL's;Develop more efficient breathing techniques such as purse lipped  breathing and diaphragmatic breathing and practicing self-pacing with activity.;Increase knowledge of respiratory medications and ability to use respiratory devices properly.       Review Todd Parsons is progressing slowly in his exercise but has attended only 4 sessions. He is improving his MET levels on the nustep and laps on the track. He is using 1 liter of oxygen with exercise His saturatios have been 89-97% with his lowest saturations being on the track. He seems to be enjoying the program and making connection with  others in his class.       Expected Outcomes Foe Todd Parsons to continue to increase his workloads and MET levels, improve his SOB with ADL and use better breathung techniques. Continue to evaluate oxygen needs for exercise.                Core Components/Risk Factors/Patient Goals at Discharge (Final Review):   Goals and Risk Factor Review - 10/07/21 1657       Core Components/Risk Factors/Patient Goals Review   Personal Goals Review Improve shortness of breath with ADL's;Develop more efficient breathing techniques such as purse lipped breathing and diaphragmatic breathing and practicing self-pacing with activity.;Increase knowledge of respiratory medications and ability to use respiratory devices properly.    Review Todd Parsons is progressing slowly in his exercise but has attended only 4 sessions. He is improving his MET levels on the nustep and laps on the track. He is using 1 liter of oxygen with exercise His saturatios have been 89-97% with his lowest saturations being on the track. He seems to be enjoying the program and making connection with others in his class.    Expected Outcomes Foe Todd Parsons to continue to increase his workloads and MET levels, improve his SOB with ADL and use better breathung techniques. Continue to evaluate oxygen needs for exercise.             ITP Comments:   Comments: Dr. Rodman Pickle is Medical Director for Pulmonary Rehab at [facility].

## 2021-10-08 ENCOUNTER — Encounter (HOSPITAL_COMMUNITY)
Admission: RE | Admit: 2021-10-08 | Discharge: 2021-10-08 | Disposition: A | Discharge status: Home / Self Care | Payer: Medicare Other | Source: Ambulatory Visit | Attending: Pulmonary Disease | Admitting: Pulmonary Disease

## 2021-10-08 ENCOUNTER — Other Ambulatory Visit: Payer: Self-pay

## 2021-10-08 DIAGNOSIS — J849 Interstitial pulmonary disease, unspecified: Secondary | ICD-10-CM | POA: Insufficient documentation

## 2021-10-08 NOTE — Progress Notes (Signed)
Daily Session Note  Patient Details  Name: Todd Parsons MRN: [id number] Date of Birth: 03-12-1946 Referring Provider:   Flowsheet Row PULMONARY REHAB OTHER RESP ORIENTATION from 09/21/2021 in [place]  Referring Provider Mannam       Encounter Date: 10/08/2021  Check In:  Session Check In - 10/08/21 1212       Check-In   Supervising physician immediately available to respond to emergencies [facility] immediately available    Physician(s) Dr. Doristine Bosworth    Location [facility]    Staff Present Rodney Langton, RN;Carlette Wilber Oliphant, RN, BSN;Jetta Walker BS, ACSM EP-C, Exercise Physiologist    Virtual Visit No    Medication changes reported     No    Fall or balance concerns reported    No    Tobacco Cessation No Change    Warm-up and Cool-down Performed as group-led instruction    Resistance Training Performed Yes    VAD Patient? No    PAD/SET Patient? No      Pain Assessment   Currently in Pain? No/denies    Multiple Pain Sites No             Capillary Blood Glucose: No results found for this or any previous visit (from the past 24 hour(s)).    Social History   Tobacco Use  Smoking Status Former   Years: 17.00   Types: Cigarettes   Quit date: 04/24/1978   Years since quitting: 43.4  Smokeless Tobacco Never    Goals Met:  Exercise tolerated well No report of concerns or symptoms today Strength training completed today  Goals Unmet:  Not Applicable  Comments: Service time is from 1028 to 1135 Pt. got up after cool down and left, without check out VS.Staff thought he was going to the bathroom.   Dr. Rodman Pickle is Medical Director for Pulmonary Rehab at [facility].

## 2021-10-13 ENCOUNTER — Other Ambulatory Visit: Payer: Self-pay

## 2021-10-13 ENCOUNTER — Encounter (HOSPITAL_COMMUNITY)
Admission: RE | Admit: 2021-10-13 | Discharge: 2021-10-13 | Disposition: A | Discharge status: Home / Self Care | Payer: Medicare Other | Source: Ambulatory Visit | Attending: Pulmonary Disease | Admitting: Pulmonary Disease

## 2021-10-13 VITALS — Wt 174.4 lb

## 2021-10-13 DIAGNOSIS — J849 Interstitial pulmonary disease, unspecified: Secondary | ICD-10-CM

## 2021-10-13 NOTE — Progress Notes (Signed)
Daily Session Note  Patient Details  Name: Todd Parsons MRN: [id number] Date of Birth: 1945/12/04 Referring Provider:   Flowsheet Row PULMONARY REHAB OTHER RESP ORIENTATION from 09/21/2021 in [place]  Referring Provider Mannam       Encounter Date: 10/13/2021  Check In:  Session Check In - 10/13/21 1149       Check-In   Supervising physician immediately available to respond to emergencies [facility] immediately available    Physician(s) Dr. Doristine Bosworth    [facility]    Staff Present Maurice Small, RN, BSN;Dezmon Conover Ysidro Evert, Cathleen Fears, MS, ACSM-CEP, Exercise Physiologist    Virtual Visit No    Medication changes reported     No    Fall or balance concerns reported    No    Tobacco Cessation No Change    Warm-up and Cool-down Performed as group-led instruction    Resistance Training Performed Yes    VAD Patient? No    PAD/SET Patient? No      Pain Assessment   Currently in Pain? No/denies    Multiple Pain Sites No             Capillary Blood Glucose: No results found for this or any previous visit (from the past 24 hour(s)).   Exercise Prescription Changes - 10/13/21 1100       Response to Exercise   Blood Pressure (Admit) 98/64    Blood Pressure (Exercise) 100/60    Blood Pressure (Exit) 116/60    Heart Rate (Admit) 56 bpm    Heart Rate (Exercise) 70 bpm    Heart Rate (Exit) 64 bpm    Oxygen Saturation (Admit) 97 %    Oxygen Saturation (Exercise) 93 %    Oxygen Saturation (Exit) 96 %    Rating of Perceived Exertion (Exercise) 11    Perceived Dyspnea (Exercise) 1    Duration Continue with 30 min of aerobic exercise without signs/symptoms of physical distress.    Intensity THRR unchanged      Progression   Progression Continue to progress workloads to maintain intensity without signs/symptoms of physical distress.      Resistance Training   Training Prescription Yes    Weight Blue bands     Reps 10-15    Time 10 Minutes      Oxygen   Oxygen Continuous    Liters 1      NuStep   Level 2    SPM 80    Minutes 15    METs 2.3      Track   Laps 17    Minutes 15             Social History   Tobacco Use  Smoking Status Former   Years: 17.00   Types: Cigarettes   Quit date: 04/24/1978   Years since quitting: 43.5  Smokeless Tobacco Never    Goals Met:  Exercise tolerated well No report of concerns or symptoms today Strength training completed today  Goals Unmet:  Not Applicable   Comments: Service time is from 1023 to [place]    Dr. Rodman Pickle is Medical Director for Pulmonary Rehab at [facility].

## 2021-10-15 ENCOUNTER — Encounter (HOSPITAL_COMMUNITY)
Admission: RE | Admit: 2021-10-15 | Discharge: 2021-10-15 | Disposition: A | Discharge status: Home / Self Care | Payer: Medicare Other | Source: Ambulatory Visit | Attending: Pulmonary Disease | Admitting: Pulmonary Disease

## 2021-10-15 ENCOUNTER — Other Ambulatory Visit: Payer: Self-pay

## 2021-10-15 DIAGNOSIS — J849 Interstitial pulmonary disease, unspecified: Secondary | ICD-10-CM | POA: Diagnosis not present

## 2021-10-15 NOTE — Progress Notes (Signed)
Daily Session Note  Patient Details  Name: Todd Parsons MRN: [id number] Date of Birth: 1946-05-21 Referring Provider:   Flowsheet Row PULMONARY REHAB OTHER RESP ORIENTATION from 09/21/2021 in [place]  Referring Provider Mannam       Encounter Date: 10/15/2021  Check In:  Session Check In - 10/15/21 1124       Check-In   Supervising physician immediately available to respond to emergencies [facility] immediately available    Physician(s) Dr. Eliseo Squires    [facility]    Staff Present Elmon Else, MS, ACSM-CEP, Exercise Physiologist;Carlette Wilber Oliphant, RN, Roque Cash, RN    Virtual Visit No    Medication changes reported     No    Fall or balance concerns reported    No    Tobacco Cessation No Change    Warm-up and Cool-down Performed as group-led instruction    Resistance Training Performed Yes    VAD Patient? No    PAD/SET Patient? No      Pain Assessment   Currently in Pain? No/denies    Multiple Pain Sites No             Capillary Blood Glucose: No results found for this or any previous visit (from the past 24 hour(s)).    Social History   Tobacco Use  Smoking Status Former   Years: 17.00   Types: Cigarettes   Quit date: 04/24/1978   Years since quitting: 43.5  Smokeless Tobacco Never    Goals Met:  Exercise tolerated well No report of concerns or symptoms today Strength training completed today  Goals Unmet:  Not Applicable  Comments: Service time is from 1020 to [place]    Dr. Rodman Pickle is Medical Director for Pulmonary Rehab at [facility].

## 2021-10-20 ENCOUNTER — Encounter (HOSPITAL_COMMUNITY)
Admission: RE | Admit: 2021-10-20 | Discharge: 2021-10-20 | Disposition: A | Discharge status: Home / Self Care | Payer: Medicare Other | Source: Ambulatory Visit | Attending: Pulmonary Disease | Admitting: Pulmonary Disease

## 2021-10-20 ENCOUNTER — Other Ambulatory Visit: Payer: Self-pay

## 2021-10-20 DIAGNOSIS — J849 Interstitial pulmonary disease, unspecified: Secondary | ICD-10-CM

## 2021-10-20 NOTE — Progress Notes (Signed)
Daily Session Note  Patient Details  Name: Todd Parsons MRN: [id number] Date of Birth: March 06, 1946 Referring Provider:   Flowsheet Row PULMONARY REHAB OTHER RESP ORIENTATION from 09/21/2021 in [place]  Referring Provider Mannam       Encounter Date: 10/20/2021  Check In:  Session Check In - 10/20/21 1128       Check-In   Supervising physician immediately available to respond to emergencies [facility] immediately available    Physician(s) Dr. Eliseo Squires    [facility]    Staff Present Elmon Else, MS, ACSM-CEP, Exercise Physiologist;Avraham Benish Ysidro Evert, RN;Carlette Wilber Oliphant, RN, BSN    Virtual Visit No    Medication changes reported     No    Fall or balance concerns reported    No    Tobacco Cessation No Change    Warm-up and Cool-down Performed as group-led instruction    Resistance Training Performed Yes    VAD Patient? No    PAD/SET Patient? No      Pain Assessment   Currently in Pain? No/denies    Multiple Pain Sites No             Capillary Blood Glucose: No results found for this or any previous visit (from the past 24 hour(s)).    Social History   Tobacco Use  Smoking Status Former   Years: 17.00   Types: Cigarettes   Quit date: 04/24/1978   Years since quitting: 43.5  Smokeless Tobacco Never    Goals Met:  Exercise tolerated well No report of concerns or symptoms today Strength training completed today  Goals Unmet:  Not Applicable  Comments: Service time is from [place] to [place]    Dr. Rodman Pickle is Medical Director for Pulmonary Rehab at [facility].

## 2021-10-22 ENCOUNTER — Encounter (HOSPITAL_COMMUNITY)
Admission: RE | Admit: 2021-10-22 | Discharge: 2021-10-22 | Disposition: A | Discharge status: Home / Self Care | Payer: Medicare Other | Source: Ambulatory Visit | Attending: Pulmonary Disease | Admitting: Pulmonary Disease

## 2021-10-22 ENCOUNTER — Other Ambulatory Visit: Payer: Self-pay

## 2021-10-22 DIAGNOSIS — J849 Interstitial pulmonary disease, unspecified: Secondary | ICD-10-CM | POA: Diagnosis not present

## 2021-10-22 NOTE — Progress Notes (Signed)
Daily Session Note  Patient Details  Name: Todd Parsons MRN: [id number] Date of Birth: 05/03/1946 Referring Provider:   Flowsheet Row PULMONARY REHAB OTHER RESP ORIENTATION from 09/21/2021 in [place]  Referring Provider Mannam       Encounter Date: 10/22/2021  Check In:  Session Check In - 10/22/21 1122       Check-In   Supervising physician immediately available to respond to emergencies [facility] immediately available    Physician(s) Dr. Cruzita Lederer    [facility]    Staff Present Rosebud Poles, RN, BSN;Ramon Dredge, RN, Fernande Bras, MS, ACSM-CEP, Exercise Physiologist    Virtual Visit No    Medication changes reported     No    Fall or balance concerns reported    No    Tobacco Cessation No Change    Warm-up and Cool-down Performed as group-led instruction    Resistance Training Performed Yes    VAD Patient? No    PAD/SET Patient? No      Pain Assessment   Currently in Pain? No/denies    Multiple Pain Sites No             Capillary Blood Glucose: No results found for this or any previous visit (from the past 24 hour(s)).    Social History   Tobacco Use  Smoking Status Former   Years: 17.00   Types: Cigarettes   Quit date: 04/24/1978   Years since quitting: 43.5  Smokeless Tobacco Never    Goals Met:  Proper associated with RPD/PD & O2 Sat Independence with exercise equipment Exercise tolerated well No report of concerns or symptoms today Strength training completed today  Goals Unmet:  Not Applicable  Comments: Service time is from 1020 to 1140.    Dr. Rodman Pickle is Medical Director for Pulmonary Rehab at [facility].

## 2021-10-27 ENCOUNTER — Other Ambulatory Visit: Payer: Self-pay

## 2021-10-27 ENCOUNTER — Encounter (HOSPITAL_COMMUNITY)
Admission: RE | Admit: 2021-10-27 | Discharge: 2021-10-27 | Disposition: A | Discharge status: Home / Self Care | Payer: Medicare Other | Source: Ambulatory Visit | Attending: Pulmonary Disease | Admitting: Pulmonary Disease

## 2021-10-27 VITALS — Wt 171.7 lb

## 2021-10-27 DIAGNOSIS — J849 Interstitial pulmonary disease, unspecified: Secondary | ICD-10-CM

## 2021-10-27 NOTE — Progress Notes (Signed)
Daily Session Note  Patient Details  Name: Todd Parsons MRN: [id number] Date of Birth: Jul 24, 1946 Referring Provider:   Flowsheet Row PULMONARY REHAB OTHER RESP ORIENTATION from 09/21/2021 in [place]  Referring Provider Mannam       Encounter Date: 10/27/2021  Check In:  Session Check In - 10/27/21 1127       Check-In   Supervising physician immediately available to respond to emergencies [facility] immediately available    Physician(s) Dr. Doristine Bosworth    [facility]    Staff Present Rosebud Poles, RN, BSN;Carlette Wilber Oliphant, RN, Quentin Ore, MS, ACSM-CEP, Exercise Physiologist;Grahm Etsitty Ysidro Evert, RN    Virtual Visit No    Medication changes reported     No    Fall or balance concerns reported    No    Tobacco Cessation No Change    Warm-up and Cool-down Performed as group-led instruction    Resistance Training Performed Yes    VAD Patient? No    PAD/SET Patient? No      Pain Assessment   Currently in Pain? No/denies    Multiple Pain Sites No             Capillary Blood Glucose: No results found for this or any previous visit (from the past 24 hour(s)).   Exercise Prescription Changes - 10/27/21 1100       Response to Exercise   Blood Pressure (Admit) 104/60    Blood Pressure (Exercise) 100/60    Blood Pressure (Exit) 94/60    Heart Rate (Admit) 62 bpm    Heart Rate (Exercise) 87 bpm    Heart Rate (Exit) 66 bpm    Oxygen Saturation (Admit) 94 %    Oxygen Saturation (Exercise) 87 %    Oxygen Saturation (Exit) 98 %    Rating of Perceived Exertion (Exercise) 10    Perceived Dyspnea (Exercise) 1    Duration Continue with 30 min of aerobic exercise without signs/symptoms of physical distress.    Intensity THRR unchanged      Progression   Progression Continue to progress workloads to maintain intensity without signs/symptoms of physical distress.      Resistance Training   Training Prescription Yes     Weight Blue bands    Reps 10-15    Time 10 Minutes      Oxygen   Oxygen Continuous    Liters 2      NuStep   Level 4    SPM 80    Minutes 15    METs 3.3      Track   Laps 18    Minutes 15             Social History   Tobacco Use  Smoking Status Former   Years: 17.00   Types: Cigarettes   Quit date: 04/24/1978   Years since quitting: 43.5  Smokeless Tobacco Never    Goals Met:  Exercise tolerated well No report of concerns or symptoms today Strength training completed today  Goals Unmet:  Not Applicable  Comments: Service time is from 1022 to [place]    Dr. Rodman Pickle is Medical Director for Pulmonary Rehab at [facility].

## 2021-10-29 ENCOUNTER — Other Ambulatory Visit: Payer: Self-pay

## 2021-10-29 ENCOUNTER — Encounter (HOSPITAL_COMMUNITY)
Admission: RE | Admit: 2021-10-29 | Discharge: 2021-10-29 | Disposition: A | Discharge status: Home / Self Care | Payer: Medicare Other | Source: Ambulatory Visit | Attending: Pulmonary Disease | Admitting: Pulmonary Disease

## 2021-10-29 DIAGNOSIS — J849 Interstitial pulmonary disease, unspecified: Secondary | ICD-10-CM

## 2021-10-29 NOTE — Progress Notes (Signed)
Daily Session Note  Patient Details  Name: Todd Parsons MRN: [id number] Date of Birth: Nov 27, 1945 Referring Provider:   Flowsheet Row PULMONARY REHAB OTHER RESP ORIENTATION from 09/21/2021 in [place]  Referring Provider Mannam       Encounter Date: 10/29/2021  Check In:  Session Check In - 10/29/21 1131       Check-In   Supervising physician immediately available to respond to emergencies [facility] immediately available    Physician(s) Dr. Eliseo Squires    [facility]    Staff Present Elmon Else, MS, ACSM-CEP, Exercise Physiologist;Other;Lisa Ysidro Evert, RN    Virtual Visit No    Medication changes reported     No    Fall or balance concerns reported    No    Tobacco Cessation No Change    Warm-up and Cool-down Performed as group-led instruction    Resistance Training Performed Yes    VAD Patient? No    PAD/SET Patient? No      Pain Assessment   Currently in Pain? No/denies    Multiple Pain Sites No             Capillary Blood Glucose: No results found for this or any previous visit (from the past 24 hour(s)).    Social History   Tobacco Use  Smoking Status Former   Years: 17.00   Types: Cigarettes   Quit date: 04/24/1978   Years since quitting: 43.5  Smokeless Tobacco Never    Goals Met:  [place] with exercise equipment Exercise tolerated well No report of concerns or symptoms today Strength training completed today  Goals Unmet:  Not Applicable  Comments: Service time is from 1023 to 1150. Post BP was 83/57. Pt given water and BP was 96/61.    Dr. Rodman Pickle is Medical Director for Pulmonary Rehab at [facility].

## 2021-11-03 ENCOUNTER — Encounter (HOSPITAL_COMMUNITY): Payer: Medicare Other

## 2021-11-03 ENCOUNTER — Telehealth (HOSPITAL_COMMUNITY): Payer: Self-pay | Admitting: *Deleted

## 2021-11-03 NOTE — Telephone Encounter (Signed)
Pt will be out today for pulmonary rehab.  Pt car is in the shop and he does not have another vehicle.  Plans to return on Thursday. Cherre Huger, BSN Cardiac and Training and development officer

## 2021-11-04 NOTE — Progress Notes (Signed)
Pulmonary Individual Treatment Plan  Patient Details  Name: Todd Parsons MRN: [id number] Date of Birth: [birth year] Referring Provider:   Flowsheet Row PULMONARY REHAB OTHER RESP ORIENTATION from 09/21/2021 in [place]  Referring Provider Mannam       Initial Encounter Date:  [place] OTHER RESP ORIENTATION from 09/21/2021 in [place]  Date 09/22/21       Visit Diagnosis: Interstitial lung disease ([place])  ILD (interstitial lung disease) ([place])  Patient's Home Medications on Admission:   Current Outpatient Medications:    amLODipine (NORVASC) 5 MG tablet, Take 10 mg by mouth., Disp: , Rfl:    aspirin EC 81 MG tablet, Take 81 mg by mouth daily., Disp: , Rfl:    atorvastatin (LIPITOR) 40 MG tablet, TAKE 1 TABLET BY MOUTH AT  BEDTIME, Disp: 90 tablet, Rfl: 3   cetirizine (ZYRTEC) 10 MG tablet, Take 10 mg by mouth as needed for allergies., Disp: , Rfl:    fenofibrate 160 MG tablet, TAKE 1 TABLET BY MOUTH  DAILY, Disp: 90 tablet, Rfl: 3   finasteride (PROSCAR) 5 MG tablet, TAKE 1 TABLET BY MOUTH  DAILY, Disp: 90 tablet, Rfl: 3   FLUoxetine (PROZAC) 20 MG capsule, TAKE 1 CAPSULE BY MOUTH  DAILY, Disp: 90 capsule, Rfl: 3   fluticasone (FLONASE) 50 MCG/ACT nasal spray, Place 2 sprays into both nostrils daily as needed for allergies or rhinitis., Disp: 48 g, Rfl: 3   metFORMIN (GLUCOPHAGE-XR) 500 MG 24 hr tablet, Take 1 tablet (500 mg total) by mouth daily with breakfast., Disp: 1 tablet, Rfl: 0   metoprolol succinate (TOPROL-XL) 50 MG 24 hr tablet, Take 1 tablet (50 mg total) by mouth daily. Take with or immediately following a meal., Disp: 90 tablet, Rfl: 1   PFIZER-BIONT COVID-19 VAC-TRIS SUSP injection, , Disp: , Rfl:    Pirfenidone (ESBRIET) 801 MG TABS, Take 801 mg by mouth with breakfast, with lunch, and with evening meal., Disp: 90 tablet, Rfl: 5   solifenacin (VESICARE) 5 MG tablet, Take 5 mg by  mouth daily., Disp: , Rfl:    tamsulosin (FLOMAX) 0.4 MG CAPS capsule, Take 1 capsule (0.4 mg total) by mouth daily after supper., Disp: 90 capsule, Rfl: 3  Past Medical History: Past Medical History:  Diagnosis Date   Arthritis    osteoarthritis   Bicuspid aortic valve    Cataract    Coronary artery disease    Dysrhythmia    atrial flutter   Echocardiogram with ECG monitoring 01/12/2010   60-65% EF   Hyperlipidemia    Hypertension    Pre-diabetes     Tobacco Use: Social History   Tobacco Use  Smoking Status Former   Years: 17.00   Types: Cigarettes   Quit date: 04/24/1978   Years since quitting: 43.5  Smokeless Tobacco Never    Labs: Recent Review Flowsheet Data     Labs for ITP Cardiac and Pulmonary Rehab Latest Ref Rng & Units 09/06/2019 12/31/2019 04/25/2020 08/26/2020 12/25/2020   Cholestrol 100 - 199 mg/dL - 118 - - -   LDLCALC 0 - 99 mg/dL - 61 - - -   HDL >39 mg/dL - 34(L) - - -   Trlycerides 0 - 149 mg/dL - 126 - - -   Hemoglobin A1c 4.8 - 5.6 % 6.0(A) 5.9(A) 5.7(A) 5.6 6.0(H)       Capillary Blood Glucose: Lab Results  Component Value Date   GLUCAP 55  10/01/2021   GLUCAP 124 (H) 10/01/2021   GLUCAP 100 (H) 09/29/2021   GLUCAP 107 (H) 09/29/2021   GLUCAP 147 (H) 03/02/2017    POCT Glucose     Row Name 09/29/21 1507             POCT Blood Glucose   Pre-Exercise 107 mg/dL       Post-Exercise 100 mg/dL                Pulmonary Assessment Scores:  Pulmonary Assessment Scores     Row Name 09/21/21 1109         ADL UCSD   ADL Phase Entry     SOB Score total 11       CAT Score   CAT Score 9       mMRC Score   mMRC Score 1             UCSD: Self-administered rating of dyspnea associated with activities of daily living (ADLs) 6-point scale (0 = "not at all" to 5 = "maximal or unable to do because of breathlessness")  Scoring Scores range from 0 to 120.  Minimally important difference is 5 units  CAT: CAT can identify the  health impairment of COPD patients and is better correlated with disease progression.  CAT has a scoring range of zero to 40. The CAT score is classified into four groups of low (less than 10), medium (10 - 20), high (21-30) and very high (31-40) based on the impact level of disease on health status. A CAT score over 10 suggests significant symptoms.  A worsening CAT score could be explained by an exacerbation, poor medication adherence, poor inhaler technique, or progression of COPD or comorbid conditions.  CAT MCID is 2 points  mMRC: mMRC (Modified Medical Research Council) Dyspnea Scale is used to assess the degree of baseline functional disability in patients of respiratory disease due to dyspnea. No minimal important difference is established. A decrease in score of 1 point or greater is considered a positive change.   Pulmonary Function Assessment:  Pulmonary Function Assessment - 09/21/21 1154       Breath   Bilateral Breath Sounds Clear    Shortness of Breath Yes;Limiting activity             Exercise Target Goals: Exercise Program Goal: Individual exercise prescription set using results from initial 6 min walk test and THRR while considering  patients activity barriers and safety.   Exercise Prescription Goal: Initial exercise prescription builds to 30-45 minutes a day of aerobic activity, 2-3 days per week.  Home exercise guidelines will be given to patient during program as part of exercise prescription that the participant will acknowledge.  Activity Barriers & Risk Stratification:  Activity Barriers & Cardiac Risk Stratification - 09/21/21 1051       Activity Barriers & Cardiac Risk Stratification   Activity Barriers Right Knee Replacement;Shortness of Breath;Deconditioning;Arthritis    Cardiac Risk Stratification Moderate             6 Minute Walk:  6 Minute Walk     Row Name 09/22/21 0739         6 Minute Walk   Phase Initial     Distance 800 feet      Walk Time 6 minutes     # of Rest Breaks 1  3:30-4:30     MPH 1.52     METS 175     RPE 13  Perceived Dyspnea  2     VO2 Peak 6.14     Symptoms No     Resting HR 52 bpm     Resting BP 115/72     Resting Oxygen Saturation  95 %     Exercise Oxygen Saturation  during 6 min walk 83 %     Max Ex. HR 115 bpm     Max Ex. BP 120/72     2 Minute Post BP 120/70       Interval HR   1 Minute HR 58     2 Minute HR 91     3 Minute HR 114     4 Minute HR 115     5 Minute HR 112     6 Minute HR 112     2 Minute Post HR 60     Interval Heart Rate? Yes       Interval Oxygen   Interval Oxygen? Yes     Baseline Oxygen Saturation % 95 %     1 Minute Oxygen Saturation % 92 %     1 Minute Liters of Oxygen 0 L     2 Minute Oxygen Saturation % 90 %     2 Minute Liters of Oxygen 0 L     3 Minute Oxygen Saturation % 87 %     3 Minute Liters of Oxygen 0 L     4 Minute Oxygen Saturation % 83 %     4 Minute Liters of Oxygen 0 L  Increased to 1L     5 Minute Oxygen Saturation % 94 %     5 Minute Liters of Oxygen 1 L     6 Minute Oxygen Saturation % 89 %     6 Minute Liters of Oxygen 1 L     2 Minute Post Oxygen Saturation % 93 %     2 Minute Post Liters of Oxygen 1 L              Oxygen Initial Assessment:  Oxygen Initial Assessment - 09/22/21 0737       Home Oxygen   Home Oxygen Device None    Sleep Oxygen Prescription None    Home Exercise Oxygen Prescription None    Home Resting Oxygen Prescription None      Initial 6 min Walk   Oxygen Used Continuous    Liters per minute 1      Program Oxygen Prescription   Program Oxygen Prescription Continuous    Liters per minute 1    Comments O2 sat 83% RA after 4 min. O2 sat 89% on 1L      Intervention   Short Term Goals To learn and exhibit compliance with exercise, home and travel O2 prescription;To learn and understand importance of monitoring SPO2 with pulse oximeter and demonstrate accurate use of the pulse oximeter.;To  learn and understand importance of maintaining oxygen saturations>88%;To learn and demonstrate proper pursed lip breathing techniques or other breathing techniques. ;To learn and demonstrate proper use of respiratory medications    Long  Term Goals Exhibits compliance with exercise, home  and travel O2 prescription;Verbalizes importance of monitoring SPO2 with pulse oximeter and return demonstration;Maintenance of O2 saturations>88%;Exhibits proper breathing techniques, such as pursed lip breathing or other method taught during program session;Compliance with respiratory medication;Demonstrates proper use of MDIs             Oxygen Re-Evaluation:  Oxygen Re-Evaluation     Row Name  09/30/21 0920 10/26/21 1052           Program Oxygen Prescription   Program Oxygen Prescription Continuous Continuous      Liters per minute 1 2      Comments -- Todd Parsons's O2 sat dropped to 85% on 1L while walking the track. His O2 has been increased to 2L on the track. Pulm doctor is aware of this.        Home Oxygen   Home Oxygen Device None None      Sleep Oxygen Prescription None None      Home Exercise Oxygen Prescription None None      Home Resting Oxygen Prescription None None        Goals/Expected Outcomes   Short Term Goals To learn and exhibit compliance with exercise, home and travel O2 prescription;To learn and understand importance of monitoring SPO2 with pulse oximeter and demonstrate accurate use of the pulse oximeter.;To learn and understand importance of maintaining oxygen saturations>88%;To learn and demonstrate proper pursed lip breathing techniques or other breathing techniques. ;To learn and demonstrate proper use of respiratory medications To learn and exhibit compliance with exercise, home and travel O2 prescription;To learn and understand importance of monitoring SPO2 with pulse oximeter and demonstrate accurate use of the pulse oximeter.;To learn and understand importance of maintaining  oxygen saturations>88%;To learn and demonstrate proper pursed lip breathing techniques or other breathing techniques. ;To learn and demonstrate proper use of respiratory medications      Long  Term Goals Exhibits compliance with exercise, home  and travel O2 prescription;Verbalizes importance of monitoring SPO2 with pulse oximeter and return demonstration;Maintenance of O2 saturations>88%;Exhibits proper breathing techniques, such as pursed lip breathing or other method taught during program session;Compliance with respiratory medication;Demonstrates proper use of MDIs Exhibits compliance with exercise, home  and travel O2 prescription;Verbalizes importance of monitoring SPO2 with pulse oximeter and return demonstration;Maintenance of O2 saturations>88%;Exhibits proper breathing techniques, such as pursed lip breathing or other method taught during program session;Compliance with respiratory medication;Demonstrates proper use of MDIs      Goals/Expected Outcomes Compliance and understanding of oxygen saturation and breathing techniques to decrease shortness of breath. Compliance and understanding of oxygen saturation and breathing techniques to decrease shortness of breath.               Oxygen Discharge (Final Oxygen Re-Evaluation):  Oxygen Re-Evaluation - 10/26/21 1052       Program Oxygen Prescription   Program Oxygen Prescription Continuous    Liters per minute 2    Comments Todd Parsons's O2 sat dropped to 85% on 1L while walking the track. His O2 has been increased to 2L on the track. Pulm doctor is aware of this.      Home Oxygen   Home Oxygen Device None    Sleep Oxygen Prescription None    Home Exercise Oxygen Prescription None    Home Resting Oxygen Prescription None      Goals/Expected Outcomes   Short Term Goals To learn and exhibit compliance with exercise, home and travel O2 prescription;To learn and understand importance of monitoring SPO2 with pulse oximeter and demonstrate accurate  use of the pulse oximeter.;To learn and understand importance of maintaining oxygen saturations>88%;To learn and demonstrate proper pursed lip breathing techniques or other breathing techniques. ;To learn and demonstrate proper use of respiratory medications    Long  Term Goals Exhibits compliance with exercise, home  and travel O2 prescription;Verbalizes importance of monitoring SPO2 with pulse oximeter and return demonstration;Maintenance of O2  saturations>88%;Exhibits proper breathing techniques, such as pursed lip breathing or other method taught during program session;Compliance with respiratory medication;Demonstrates proper use of MDIs    Goals/Expected Outcomes Compliance and understanding of oxygen saturation and breathing techniques to decrease shortness of breath.             Initial Exercise Prescription:  Initial Exercise Prescription - 09/22/21 0700       Date of Initial Exercise RX and Referring Provider   Date 09/22/21    Referring Provider Mannam    Expected Discharge Date 11/26/21      Oxygen   Oxygen Continuous    Liters 1    Maintain Oxygen Saturation 88% or higher      NuStep   Level 1    SPM 70    Minutes 15      Track   Minutes 15    METs 1.75      Prescription Details   Frequency (times per week) 2    Duration Progress to 30 minutes of continuous aerobic without signs/symptoms of physical distress      Intensity   THRR 40-80% of Max Heartrate 58-115    Ratings of Perceived Exertion 11-13    Perceived Dyspnea 0-4      Progression   Progression Continue to progress workloads to maintain intensity without signs/symptoms of physical distress.      Resistance Training   Training Prescription Yes    Weight blue bands    Reps 10-15             Perform Capillary Blood Glucose checks as needed.  Exercise Prescription Changes:   Exercise Prescription Changes     Row Name 09/29/21 1200 10/13/21 1100 10/27/21 1100         Response to  Exercise   Blood Pressure (Admit) 110/80 98/64 104/60     Blood Pressure (Exercise) 112/62 100/60 100/60     Blood Pressure (Exit) 94/60 116/60 94/60     Heart Rate (Admit) 54 bpm 56 bpm 62 bpm     Heart Rate (Exercise) 62 bpm 70 bpm 87 bpm     Heart Rate (Exit) 61 bpm 64 bpm 66 bpm     Oxygen Saturation (Admit) 96 % 97 % 94 %     Oxygen Saturation (Exercise) 95 % 93 % 87 %     Oxygen Saturation (Exit) 98 % 96 % 98 %     Rating of Perceived Exertion (Exercise) 11 11 10      Perceived Dyspnea (Exercise) 1 1 1      Duration Progress to 30 minutes of  aerobic without signs/symptoms of physical distress Continue with 30 min of aerobic exercise without signs/symptoms of physical distress. Continue with 30 min of aerobic exercise without signs/symptoms of physical distress.     Intensity THRR unchanged THRR unchanged THRR unchanged       Progression   Progression Continue to progress workloads to maintain intensity without signs/symptoms of physical distress. Continue to progress workloads to maintain intensity without signs/symptoms of physical distress. Continue to progress workloads to maintain intensity without signs/symptoms of physical distress.       Resistance Training   Training Prescription Yes Yes Yes     Weight blue bands Blue bands Blue bands     Reps 10-15 10-15 10-15     Time 10 Minutes 10 Minutes 10 Minutes       Oxygen   Oxygen Continuous Continuous Continuous     Liters 1 1 2  NuStep   Level 1 2 4      SPM 70 80 80     Minutes 15 15 15      METs 1.7 2.3 3.3       Track   Laps 16 17 18      Minutes 15 15 15      METs 2.86 -- --       Oxygen   Maintain Oxygen Saturation 88% or higher -- --              Exercise Comments:   Exercise Comments     Row Name 09/29/21 1226           Exercise Comments Pt completed 1st day of exercise. Todd Parsons exercised for 15 min on the [place] and track. He averaged 1.7 METs at level 1 on the Nustep and 2.86 METs on the track. He  performed the warmup and cooldown standing without limitations.                Exercise Goals and Review:   Exercise Goals     Row Name 09/21/21 1052 09/30/21 0916 10/26/21 1049         Exercise Goals   Increase Physical Activity Yes Yes Yes     Intervention Provide advice, education, support and counseling about physical activity/exercise needs.;Develop an individualized exercise prescription for aerobic and resistive training based on initial evaluation findings, risk stratification, comorbidities and participant's personal goals. Provide advice, education, support and counseling about physical activity/exercise needs.;Develop an individualized exercise prescription for aerobic and resistive training based on initial evaluation findings, risk stratification, comorbidities and participant's personal goals. Provide advice, education, support and counseling about physical activity/exercise needs.;Develop an individualized exercise prescription for aerobic and resistive training based on initial evaluation findings, risk stratification, comorbidities and participant's personal goals.     Expected Outcomes Short Term: Attend rehab on a regular basis to increase amount of physical activity.;Long Term: Add in home exercise to make exercise part of routine and to increase amount of physical activity.;Long Term: Exercising regularly at least 3-5 days a week. Short Term: Attend rehab on a regular basis to increase amount of physical activity.;Long Term: Add in home exercise to make exercise part of routine and to increase amount of physical activity.;Long Term: Exercising regularly at least 3-5 days a week. Short Term: Attend rehab on a regular basis to increase amount of physical activity.;Long Term: Add in home exercise to make exercise part of routine and to increase amount of physical activity.;Long Term: Exercising regularly at least 3-5 days a week.     Increase Strength and Stamina Yes Yes Yes      Intervention Provide advice, education, support and counseling about physical activity/exercise needs.;Develop an individualized exercise prescription for aerobic and resistive training based on initial evaluation findings, risk stratification, comorbidities and participant's personal goals. Provide advice, education, support and counseling about physical activity/exercise needs.;Develop an individualized exercise prescription for aerobic and resistive training based on initial evaluation findings, risk stratification, comorbidities and participant's personal goals. Provide advice, education, support and counseling about physical activity/exercise needs.;Develop an individualized exercise prescription for aerobic and resistive training based on initial evaluation findings, risk stratification, comorbidities and participant's personal goals.     Expected Outcomes Short Term: Increase workloads from initial exercise prescription for resistance, speed, and METs.;Short Term: Perform resistance training exercises routinely during rehab and add in resistance training at home;Long Term: Improve cardiorespiratory fitness, muscular endurance and strength as measured by increased METs and functional capacity (6MWT) Short  Term: Increase workloads from initial exercise prescription for resistance, speed, and METs.;Short Term: Perform resistance training exercises routinely during rehab and add in resistance training at home;Long Term: Improve cardiorespiratory fitness, muscular endurance and strength as measured by increased METs and functional capacity (6MWT) Short Term: Increase workloads from initial exercise prescription for resistance, speed, and METs.;Short Term: Perform resistance training exercises routinely during rehab and add in resistance training at home;Long Term: Improve cardiorespiratory fitness, muscular endurance and strength as measured by increased METs and functional capacity (6MWT)     Able to  understand and use rate of perceived exertion (RPE) scale Yes Yes Yes     Intervention Provide education and explanation on how to use RPE scale Provide education and explanation on how to use RPE scale Provide education and explanation on how to use RPE scale     Expected Outcomes Short Term: Able to use RPE daily in rehab to express subjective intensity level;Long Term:  Able to use RPE to guide intensity level when exercising independently Short Term: Able to use RPE daily in rehab to express subjective intensity level;Long Term:  Able to use RPE to guide intensity level when exercising independently Short Term: Able to use RPE daily in rehab to express subjective intensity level;Long Term:  Able to use RPE to guide intensity level when exercising independently     Able to understand and use Dyspnea scale Yes Yes Yes     Intervention Provide education and explanation on how to use Dyspnea scale Provide education and explanation on how to use Dyspnea scale Provide education and explanation on how to use Dyspnea scale     Expected Outcomes Short Term: Able to use Dyspnea scale daily in rehab to express subjective sense of shortness of breath during exertion;Long Term: Able to use Dyspnea scale to guide intensity level when exercising independently Short Term: Able to use Dyspnea scale daily in rehab to express subjective sense of shortness of breath during exertion;Long Term: Able to use Dyspnea scale to guide intensity level when exercising independently Short Term: Able to use Dyspnea scale daily in rehab to express subjective sense of shortness of breath during exertion;Long Term: Able to use Dyspnea scale to guide intensity level when exercising independently     Knowledge and understanding of Target Heart Rate Range (THRR) Yes Yes Yes     Intervention Provide education and explanation of THRR including how the numbers were predicted and where they are located for reference Provide education and  explanation of THRR including how the numbers were predicted and where they are located for reference Provide education and explanation of THRR including how the numbers were predicted and where they are located for reference     Expected Outcomes Short Term: Able to state/look up THRR;Long Term: Able to use THRR to govern intensity when exercising independently;Short Term: Able to use daily as guideline for intensity in rehab Short Term: Able to state/look up THRR;Long Term: Able to use THRR to govern intensity when exercising independently;Short Term: Able to use daily as guideline for intensity in rehab Short Term: Able to state/look up THRR;Long Term: Able to use THRR to govern intensity when exercising independently;Short Term: Able to use daily as guideline for intensity in rehab     Understanding of Exercise Prescription Yes Yes Yes     Intervention Provide education, explanation, and written materials on patient's individual exercise prescription Provide education, explanation, and written materials on patient's individual exercise prescription Provide education, explanation, and written materials  on patient's individual exercise prescription     Expected Outcomes Short Term: Able to explain program exercise prescription;Long Term: Able to explain home exercise prescription to exercise independently Short Term: Able to explain program exercise prescription;Long Term: Able to explain home exercise prescription to exercise independently Short Term: Able to explain program exercise prescription;Long Term: Able to explain home exercise prescription to exercise independently              Exercise Goals Re-Evaluation :  Exercise Goals Re-Evaluation     Row Name 09/30/21 0917 10/26/21 1049           Exercise Goal Re-Evaluation   Exercise Goals Review Increase Physical Activity;Increase Strength and Stamina;Able to understand and use rate of perceived exertion (RPE) scale;Able to understand and  use Dyspnea scale;Knowledge and understanding of Target Heart Rate Range (THRR);Understanding of Exercise Prescription Increase Physical Activity;Increase Strength and Stamina;Able to understand and use rate of perceived exertion (RPE) scale;Able to understand and use Dyspnea scale;Knowledge and understanding of Target Heart Rate Range (THRR);Understanding of Exercise Prescription      Comments Todd Parsons has completed 1 exercise session. He exercised for 15 min on the Nustep and track. He averaged 1.7 METs on the Nustep and 2.86 METs on the track. Todd Parsons performed the warmup and cooldown standing without limitations. It is soon to note any discernable progressions. Will discuss METs and how to increase METs soon. Will continue to monitor and progress as able. Todd Parsons has completed 8 exercise sessions. He exercises for 15 min on the Nustep and track. He averages 2.8 METs at level 4 on the Nustep and 2.86 METs on the track. Todd Parsons has increased his workload on the Nustep and has tolerated the increase well. The goal is to encourage Todd Parsons to increase his METs on the [place] and laps on hte track. He performs the warmup and cooldown standing without limitations. Will continue to monitor and progress as able.      Expected Outcomes Through exercise at rehab and home, the patient will decrease shortness of breath with daily activities and feel confident in carrying out an exercise regimen at home. Through exercise at rehab and home, the patient will decrease shortness of breath with daily activities and feel confident in carrying out an exercise regimen at home.               Discharge Exercise Prescription (Final Exercise Prescription Changes):  Exercise Prescription Changes - 10/27/21 1100       Response to Exercise   Blood Pressure (Admit) 104/60    Blood Pressure (Exercise) 100/60    Blood Pressure (Exit) 94/60    Heart Rate (Admit) 62 bpm    Heart Rate (Exercise) 87 bpm    Heart Rate (Exit) 66 bpm    Oxygen Saturation  (Admit) 94 %    Oxygen Saturation (Exercise) 87 %    Oxygen Saturation (Exit) 98 %    Rating of Perceived Exertion (Exercise) 10    Perceived Dyspnea (Exercise) 1    Duration Continue with 30 min of aerobic exercise without signs/symptoms of physical distress.    Intensity THRR unchanged      Progression   Progression Continue to progress workloads to maintain intensity without signs/symptoms of physical distress.      Resistance Training   Training Prescription Yes    Weight Blue bands    Reps 10-15    Time 10 Minutes      Oxygen   Oxygen Continuous    Liters  2      NuStep   Level 4    SPM 80    Minutes 15    METs 3.3      Track   Laps 18    Minutes 15             Nutrition:  Target Goals: Understanding of nutrition guidelines, daily intake of sodium <1533m, cholesterol <2060m calories 30% from fat and 7% or less from saturated fats, daily to have 5 or more servings of fruits and vegetables.  Biometrics:  Pre Biometrics - 09/21/21 1155       Pre Biometrics   Grip Strength 43 kg              Nutrition Therapy Plan and Nutrition Goals:   Nutrition Assessments:  MEDIFICTS Score Key: ?70 Need to make dietary changes  40-70 Heart Healthy Diet ? 40 Therapeutic Level Cholesterol Diet   Picture Your Plate Scores: <4<25nhealthy dietary pattern with much room for improvement. 41-50 Dietary pattern unlikely to meet recommendations for good health and room for improvement. 51-60 More healthful dietary pattern, with some room for improvement.  >60 Healthy dietary pattern, although there may be some specific behaviors that could be improved.    Nutrition Goals Re-Evaluation:   Nutrition Goals Discharge (Final Nutrition Goals Re-Evaluation):   Psychosocial: Target Goals: Acknowledge presence or absence of significant depression and/or stress, maximize coping skills, provide positive support system. Participant is able to verbalize types and ability  to use techniques and skills needed for reducing stress and depression.  Initial Review & Psychosocial Screening:  Initial Psych Review & Screening - 09/21/21 1103       Initial Review   Current issues with None Identified      Family Dynamics   Good Support System? Yes    Comments wife and children      Barriers   Psychosocial barriers to participate in program There are no identifiable barriers or psychosocial needs.      Screening Interventions   Interventions Encouraged to exercise             Quality of Life Scores:  Scores of 19 and below usually indicate a poorer quality of life in these areas.  A difference of  2-3 points is a clinically meaningful difference.  A difference of 2-3 points in the total score of the Quality of Life Index has been associated with significant improvement in overall quality of life, self-image, physical symptoms, and general health in studies assessing change in quality of life.  PHQ-9: Recent Review Flowsheet Data     [facility] 09/21/2021 05/23/2021 12/25/2020 08/26/2020 04/26/2019   Decreased Interest 0 0 0 0 0   Down, Depressed, Hopeless 0 0 0 0 0   PHQ - 2 Score 0 0 0 0 0   Altered sleeping 0 - - - -   Tired, decreased energy 0 - - - -   Change in appetite 0 - - - -   Feeling bad or failure about yourself  0 - - - -   Trouble concentrating 0 - - - -   Moving slowly or fidgety/restless 0 - - - -   Suicidal thoughts 0 - - - -   PHQ-9 Score 0 - - - -   Difficult doing work/chores Not difficult at all - - - -      Interpretation of Total Score  Total Score Depression Severity:  1-4 = Minimal depression, 5-9 =  Mild depression, 10-14 = Moderate depression, 15-19 = Moderately severe depression, 20-27 = Severe depression   Psychosocial Evaluation and Intervention:  Psychosocial Evaluation - 09/21/21 1103       Psychosocial Evaluation & Interventions   Interventions Encouraged to exercise with the program and follow  exercise prescription             Psychosocial Re-Evaluation:  Psychosocial Re-Evaluation     [place] Name 10/07/21 1649 11/04/21 1616           Psychosocial Re-Evaluation   Current issues with None Identified None Identified      Comments Todd Parsons continues to have no identified psychosocial barriers or issues. He is progressing slowly in  his exercise. He is on the nu step and track for exercise. He has attended 4 sessions Todd Parsons is progressing well in the PR program. He continues with no psychosocial barriers.      Expected Outcomes For Todd Parsons to remain free of any psychosocial barriers or concerns For him to continue to have no psychosocial concerns or issues.      Interventions Encouraged to attend Pulmonary Rehabilitation for the exercise Encouraged to attend Pulmonary Rehabilitation for the exercise      Continue Psychosocial Services  No Follow up required No Follow up required               Psychosocial Discharge (Final Psychosocial Re-Evaluation):  Psychosocial Re-Evaluation - 11/04/21 1616       Psychosocial Re-Evaluation   Current issues with None Identified    Comments Todd Parsons is progressing well in the PR program. He continues with no psychosocial barriers.    Expected Outcomes For him to continue to have no psychosocial concerns or issues.    Interventions Encouraged to attend Pulmonary Rehabilitation for the exercise    Continue Psychosocial Services  No Follow up required             Education: Education Goals: Education classes will be provided on a weekly basis, covering required topics. Participant will state understanding/return demonstration of topics presented.  Learning Barriers/Preferences:   Education Topics: Risk Factor Reduction:  -Group instruction that is supported by a PowerPoint presentation. Instructor discusses the definition of a risk factor, different risk factors for pulmonary disease, and how the heart and lungs work together.     Nutrition for  Pulmonary Patient:  -Group instruction provided by PowerPoint slides, verbal discussion, and written materials to support subject matter. The instructor gives an explanation and review of healthy diet recommendations, which includes a discussion on weight management, recommendations for fruit and vegetable consumption, as well as protein, fluid, caffeine, fiber, sodium, sugar, and alcohol. Tips for eating when patients are short of breath are discussed.   Pursed Lip Breathing:  -Group instruction that is supported by demonstration and informational handouts. Instructor discusses the benefits of pursed lip and diaphragmatic breathing and detailed demonstration on how to preform both.   Flowsheet Row PULMONARY REHAB OTHER RESPIRATORY from 10/29/2021 in [place]  Date 10/29/21  Educator Donnetta Simpers  Instruction Review Code 1- Verbalizes Understanding       Oxygen Safety:  -Group instruction provided by PowerPoint, verbal discussion, and written material to support subject matter. There is an overview of What is Oxygen and Why do we need it.  Instructor also reviews how to create a safe environment for oxygen use, the importance of using oxygen as prescribed, and the risks of noncompliance. There is a brief discussion on traveling with oxygen  and resources the patient may utilize.   Oxygen Equipment:  -Group instruction provided by [facility] Staff utilizing handouts, written materials, and equipment demonstrations.   Signs and Symptoms:  -Group instruction provided by written material and verbal discussion to support subject matter. Warning signs and symptoms of infection, stroke, and heart attack are reviewed and when to call the physician/911 reinforced. Tips for preventing the spread of infection discussed.   Advanced Directives:  -Group instruction provided by verbal instruction and written material to support subject matter. Instructor reviews Advanced  Directive laws and proper instruction for filling out document.   Pulmonary Video:  -Group video education that reviews the importance of medication and oxygen compliance, exercise, good nutrition, pulmonary hygiene, and pursed lip and diaphragmatic breathing for the pulmonary patient.   Exercise for the Pulmonary Patient:  -Group instruction that is supported by a PowerPoint presentation. Instructor discusses benefits of exercise, core components of exercise, frequency, duration, and intensity of an exercise routine, importance of utilizing pulse oximetry during exercise, safety while exercising, and options of places to exercise outside of rehab.   Flowsheet Row PULMONARY REHAB OTHER RESPIRATORY from 10/22/2021 in [place]  Date 10/08/21  Educator --  [Handout]       Pulmonary Medications:  -Verbally interactive group education provided by instructor with focus on inhaled medications and proper administration. Flowsheet Row PULMONARY REHAB OTHER RESPIRATORY from 10/22/2021 in [place]  Date 10/15/21  Educator Donnetta Simpers  [Handout]       Anatomy and Physiology of the Respiratory System and Intimacy:  -Group instruction provided by PowerPoint, verbal discussion, and written material to support subject matter. Instructor reviews respiratory cycle and anatomical components of the respiratory system and their functions. Instructor also reviews differences in obstructive and restrictive respiratory diseases with examples of each. Intimacy, Sex, and Sexuality differences are reviewed with a discussion on how relationships can change when diagnosed with pulmonary disease. Common sexual concerns are reviewed.   MD DAY -A group question and answer session with a medical doctor that allows participants to ask questions that relate to their pulmonary disease state.   OTHER EDUCATION -Group or individual verbal, written, or video  instructions that support the educational goals of the pulmonary rehab program. [employer] from 10/22/2021 in [place]  Date 10/22/21  Educator [employer] H/O  Instruction Review Code 1- Verbalizes Understanding       Holiday Eating Survival Tips:  -Group instruction provided by PowerPoint slides, verbal discussion, and written materials to support subject matter. The instructor gives patients tips, tricks, and techniques to help them not only survive but enjoy the holidays despite the onslaught of food that accompanies the holidays.   Knowledge Questionnaire Score:  Knowledge Questionnaire Score - 09/21/21 1108       Knowledge Questionnaire Score   Pre Score 17/18             Core Components/Risk Factors/Patient Goals at Admission:  Personal Goals and Risk Factors at Admission - 09/21/21 1104       Core Components/Risk Factors/Patient Goals on Admission   Improve shortness of breath with ADL's Yes    Intervention Provide education, individualized exercise plan and daily activity instruction to help decrease symptoms of SOB with activities of daily living.    Expected Outcomes Short Term: Improve cardiorespiratory fitness to achieve a reduction of symptoms when performing ADLs;Long Term: Be able to perform more ADLs  without symptoms or delay the onset of symptoms             Core Components/Risk Factors/Patient Goals Review:   Goals and Risk Factor Review     Row Name 10/07/21 1657 11/04/21 1619           Core Components/Risk Factors/Patient Goals Review   Personal Goals Review Improve shortness of breath with ADL's;Develop more efficient breathing techniques such as purse lipped breathing and diaphragmatic breathing and practicing self-pacing with activity.;Increase knowledge of respiratory medications and ability to use respiratory devices properly. Improve shortness of breath with ADL's;Develop  more efficient breathing techniques such as purse lipped breathing and diaphragmatic breathing and practicing self-pacing with activity.;Increase knowledge of respiratory medications and ability to use respiratory devices properly.      Review Todd Parsons is progressing slowly in his exercise but has attended only 4 sessions. He is improving his MET levels on the nustep and laps on the track. He is using 1 liter of oxygen with exercise His saturatios have been 89-97% with his lowest saturations being on the track. He seems to be enjoying the program and making connection with others in his class. Todd Parsons has progressed well using the nustep and walking the track for exercise.He is on level 5 on the nustep and walking 16-18 laps on the track. He reports the exercise to be very to fairly light and his SOB to be mild. We have has to use 2 liters of oxygen to maintain his oxygen saturations greater than 88%. He does not have oxygen at home, but we have notified Dr. Vaughan Browner that he has needed oxygen as he has exerted himself.      Expected Outcomes Foe Todd Parsons to continue to increase his workloads and MET levels, improve his SOB with ADL and use better breathung techniques. Continue to evaluate oxygen needs for exercise. For Todd Parsons to continue to progress with his exercise. Obtain oxygen for home use so his oxygen staurations maintain greater than 88% as he continues with exercise after discharge from the PR program.               Core Components/Risk Factors/Patient Goals at Discharge (Final Review):   Goals and Risk Factor Review - 11/04/21 1619       Core Components/Risk Factors/Patient Goals Review   Personal Goals Review Improve shortness of breath with ADL's;Develop more efficient breathing techniques such as purse lipped breathing and diaphragmatic breathing and practicing self-pacing with activity.;Increase knowledge of respiratory medications and ability to use respiratory devices properly.    Review Todd Parsons has progressed  well using the nustep and walking the track for exercise.He is on level 5 on the nustep and walking 16-18 laps on the track. He reports the exercise to be very to fairly light and his SOB to be mild. We have has to use 2 liters of oxygen to maintain his oxygen saturations greater than 88%. He does not have oxygen at home, but we have notified Dr. Vaughan Browner that he has needed oxygen as he has exerted himself.    Expected Outcomes For Todd Parsons to continue to progress with his exercise. Obtain oxygen for home use so his oxygen staurations maintain greater than 88% as he continues with exercise after discharge from the PR program.             ITP Comments:   Comments: ITP REVIEW Pt is making expected progress toward pulmonary rehab goals after completing 10 sessions. Recommend continued exercise, life style modification, education, and  utilization of breathing techniques to increase stamina and strength and decrease shortness of breath with exertion.  Dr. Rodman Pickle is Medical Director for Pulmonary Rehab at [facility].

## 2021-11-05 ENCOUNTER — Telehealth: Payer: Self-pay | Admitting: Pulmonary Disease

## 2021-11-05 ENCOUNTER — Other Ambulatory Visit: Payer: Self-pay

## 2021-11-05 ENCOUNTER — Encounter (HOSPITAL_COMMUNITY)
Admission: RE | Admit: 2021-11-05 | Discharge: 2021-11-05 | Disposition: A | Discharge status: Home / Self Care | Payer: Medicare Other | Source: Ambulatory Visit | Attending: Pulmonary Disease | Admitting: Pulmonary Disease

## 2021-11-05 DIAGNOSIS — J84112 Idiopathic pulmonary fibrosis: Secondary | ICD-10-CM

## 2021-11-05 DIAGNOSIS — J849 Interstitial pulmonary disease, unspecified: Secondary | ICD-10-CM | POA: Diagnosis not present

## 2021-11-05 NOTE — Telephone Encounter (Signed)
Called and left message for Lattie Haw to call office back regarding the need for oxygen at 3L.  ?

## 2021-11-05 NOTE — Progress Notes (Signed)
Pulmonary Rehab 1220 ?Called Dr.Mannam's office to report that Todd Parsons has required oxygen starting with his 6 minute walk test on his orientation to the Pulmonary Rehab program. At that time he required 2 liters. At present as he has progressed with his exercise he is now requiring 3 liters for exercise. He does not have oxygen at home and has never had a prescription for it. Left message for the nurse.  ?

## 2021-11-05 NOTE — Progress Notes (Signed)
Daily Session Note ? ?Patient Details  ?Name: Todd Parsons ?MRN: [id number] ?Date of Birth: 02/28/1946 ?Referring Provider:   ?Flowsheet Row PULMONARY REHAB OTHER RESP ORIENTATION from 09/21/2021 in [place]  ?Referring Provider Mannam  ? ?  ? ? ?Encounter Date: 11/05/2021 ? ?Check In: ? Session Check In - 11/05/21 1121   ? ?  ? Check-In  ? Supervising physician immediately available to respond to emergencies [facility] immediately available   ? Physician(s) Dr. Cruzita Lederer   ? Location [facility]   ? Staff Present Rodney Langton, Cathleen Fears, MS, ACSM-CEP, Exercise Physiologist;David Makemson, MS, ACSM-CEP, CCRP, Exercise Physiologist   ? Virtual Visit No   ? Medication changes reported     No   ? Fall or balance concerns reported    No   ? Tobacco Cessation No Change   ? Warm-up and Cool-down Performed as group-led instruction   ? Resistance Training Performed Yes   ? VAD Patient? No   ? PAD/SET Patient? No   ?  ? Pain Assessment  ? Currently in Pain? No/denies   ? Multiple Pain Sites No   ? ?  ?  ? ?  ? ? ?Capillary Blood Glucose: ?No results found for this or any previous visit (from the past 24 hour(s)). ? ? ? ?Social History  ? ?Tobacco Use  ?Smoking Status Former  ? Years: 17.00  ? Types: Cigarettes  ? Quit date: 04/24/1978  ? Years since quitting: 43.5  ?Smokeless Tobacco Never  ? ? ?Goals Met:  ?Exercise tolerated well ?No report of concerns or symptoms today ?Strength training completed today ? ?Goals Unmet:  ?Not Applicable ? ?Comments: Service time is from 1017 to 1150 ? ? ? ?Dr. Rodman Pickle is Medical Director for Pulmonary Rehab at [facility].  ?

## 2021-11-05 NOTE — Progress Notes (Signed)
Home Exercise Prescription ?I have reviewed a Home Exercise Prescription with Todd Parsons. BJ is currently exercising at home. He walks his dog for 2 non-rehab days/wk for 30 min/day. BJ stated that he has a large yard to walk around and he also does streneous house/ yard work. I discussed the differences between daily activities (ADLs) and exercise. BJ voiced understanding. I also discussed increasing his walking once he graduates from rehab. I mentioned to BJ the minimum requirement for frequency of exercise. BJ agreed with my recommendations. The patient stated that their goals were to ski again. He is hoping to improve his functional capacity and breathing to be able to ski again. We reviewed exercise guidelines, target heart rate during exercise, RPE Scale, weather conditions, endpoints for exercise, warmup and cool down. The patient is encouraged to come to me with any questions. I will continue to follow up with the patient to assist them with progression and safety.   ? ?Sheppard Plumber, MS, ACSM-CEP ?11/05/2021 ?12:06 PM  ?

## 2021-11-06 NOTE — Telephone Encounter (Signed)
Called and spoke with patient. He stated that he would like to see if the Esbriet 801mg  prescription could be changed. He is currently getting a 30 day supply but the specialty pharmacy has not been refilling his medication on time. I advised him that I would send a message to the pharmacy staff to make sure this would be ok. He verbalized understanding.  ? ? ?Pharmacy team, please advise if we can send in a 90 day of Esbriet? Thanks!  ?

## 2021-11-09 MED ORDER — PIRFENIDONE 801 MG PO TABS: 801.0000 mg | ORAL_TABLET | Freq: Three times a day (TID) | ORAL | 0 refills | Status: DC

## 2021-11-09 NOTE — Telephone Encounter (Signed)
Refill for Esbriet sent to [facility] for 3 month supply.  Patient has f/u visit on 11/23/21 with Dr. Vaughan Browner and will get updated LFTs at that visit ? ?Knox Saliva, PharmD, MPH, BCPS ?Clinical Pharmacist (Rheumatology and Pulmonology) ?

## 2021-11-10 ENCOUNTER — Other Ambulatory Visit: Payer: Self-pay

## 2021-11-10 ENCOUNTER — Telehealth (HOSPITAL_COMMUNITY): Payer: Self-pay | Admitting: *Deleted

## 2021-11-10 ENCOUNTER — Encounter (HOSPITAL_COMMUNITY)
Admission: RE | Admit: 2021-11-10 | Discharge: 2021-11-10 | Disposition: A | Discharge status: Home / Self Care | Payer: Medicare Other | Source: Ambulatory Visit | Attending: Pulmonary Disease | Admitting: Pulmonary Disease

## 2021-11-10 VITALS — Wt 172.2 lb

## 2021-11-10 DIAGNOSIS — J849 Interstitial pulmonary disease, unspecified: Secondary | ICD-10-CM | POA: Diagnosis not present

## 2021-11-10 NOTE — Telephone Encounter (Signed)
Returned call to April for her request for oxygen documentation for pt. I left message for her that we would fax his walk test and information on his current oxygen requirements for exercise. ?

## 2021-11-10 NOTE — Progress Notes (Signed)
Daily Session Note ? ?Patient Details  ?Name: Todd Parsons ?MRN: [id number] ?Date of Birth: [birth year] ?Referring Provider:   ?Flowsheet Row PULMONARY REHAB OTHER RESP ORIENTATION from 09/21/2021 in [place]  ?Referring Provider Mannam  ? ?  ? ? ?Encounter Date: 11/10/2021 ? ?Check In: ? Session Check In - 11/10/21 1125   ? ?  ? Check-In  ? Supervising physician immediately available to respond to emergencies [facility] immediately available   ? Location [facility]   ? Staff Present Elmon Else, MS, ACSM-CEP, Exercise Physiologist;Ebonye Reade Ysidro Evert, RN;Portia Rollene Rotunda, RN, BSN;Carlette Wilber Oliphant, RN, BSN   ? Virtual Visit No   ? Medication changes reported     No   ? Fall or balance concerns reported    No   ? Tobacco Cessation No Change   ? Warm-up and Cool-down Performed as group-led instruction   ? Resistance Training Performed Yes   ? VAD Patient? No   ? PAD/SET Patient? No   ?  ? Pain Assessment  ? Currently in Pain? No/denies   ? Multiple Pain Sites No   ? ?  ?  ? ?  ? ? ?Capillary Blood Glucose: ?No results found for this or any previous visit (from the past 24 hour(s)). ? ? Exercise Prescription Changes - 11/10/21 1100   ? ?  ? Response to Exercise  ? Blood Pressure (Admit) 114/66   ? Blood Pressure (Exercise) 138/80   ? Blood Pressure (Exit) 106/50   ? Heart Rate (Admit) 60 bpm   ? Heart Rate (Exercise) 83 bpm   ? Heart Rate (Exit) 64 bpm   ? Oxygen Saturation (Admit) 97 %   ? Oxygen Saturation (Exercise) 89 %   ? Oxygen Saturation (Exit) 98 %   ? Rating of Perceived Exertion (Exercise) 11   ? Perceived Dyspnea (Exercise) 1   ? Duration Continue with 30 min of aerobic exercise without signs/symptoms of physical distress.   ? Intensity THRR unchanged   ?  ? Progression  ? Progression Continue to progress workloads to maintain intensity without signs/symptoms of physical distress.   ?  ? Resistance Training  ? Training Prescription Yes   ? Weight Blue bands   ?  Reps 10-15   ? Time 10 Minutes   ?  ? Oxygen  ? Oxygen Continuous   ? Liters 1-3   ?  ? NuStep  ? Level 4   ? SPM 80   ? Minutes 15   ? METs 3.4   ?  ? Track  ? Laps 21   ? Minutes 15   ? ?  ?  ? ?  ? ? ?Social History  ? ?Tobacco Use  ?Smoking Status Former  ? Years: 17.00  ? Types: Cigarettes  ? Quit date: 04/24/1978  ? Years since quitting: 43.5  ?Smokeless Tobacco Never  ? ? ?Goals Met:  ?Exercise tolerated well ?No report of concerns or symptoms today ?Strength training completed today ? ?Goals Unmet:  ?Not Applicable ? ?Comments: Service time is from 1028 to 1145 ? ? ? ?Dr. Rodman Pickle is Medical Director for Pulmonary Rehab at [facility].  ?

## 2021-11-12 ENCOUNTER — Other Ambulatory Visit: Payer: Self-pay

## 2021-11-12 ENCOUNTER — Encounter (HOSPITAL_COMMUNITY)
Admission: RE | Admit: 2021-11-12 | Discharge: 2021-11-12 | Disposition: A | Discharge status: Home / Self Care | Payer: Medicare Other | Source: Ambulatory Visit | Attending: Pulmonary Disease | Admitting: Pulmonary Disease

## 2021-11-12 DIAGNOSIS — J849 Interstitial pulmonary disease, unspecified: Secondary | ICD-10-CM | POA: Diagnosis not present

## 2021-11-12 NOTE — Progress Notes (Signed)
Daily Session Note ? ?Patient Details  ?Name: Winslow Ederer Ledford ?MRN: [id number] ?Date of Birth: 12/24/1945 ?Referring Provider:   ?Flowsheet Row PULMONARY REHAB OTHER RESP ORIENTATION from 09/21/2021 in [place]  ?Referring Provider Mannam  ? ?  ? ? ?Encounter Date: 11/12/2021 ? ?Check In: ? Session Check In - 11/12/21 1137   ? ?  ? Check-In  ? Supervising physician immediately available to respond to emergencies [facility] immediately available   ? Physician(s) Dr. Alfredia Ferguson   ? Location [facility]   ? Staff Present Rodney Langton, RN;Carlette Wilber Oliphant, RN, Quentin Ore, MS, ACSM-CEP, Exercise Physiologist   ? Virtual Visit No   ? Medication changes reported     No   ? Fall or balance concerns reported    No   ? Tobacco Cessation No Change   ? Warm-up and Cool-down Performed as group-led instruction   ? Resistance Training Performed Yes   ? VAD Patient? No   ? PAD/SET Patient? No   ?  ? Pain Assessment  ? Currently in Pain? No/denies   ? Multiple Pain Sites No   ? ?  ?  ? ?  ? ? ?Capillary Blood Glucose: ?No results found for this or any previous visit (from the past 24 hour(s)). ? ? ? ?Social History  ? ?Tobacco Use  ?Smoking Status Former  ? Years: 17.00  ? Types: Cigarettes  ? Quit date: 04/24/1978  ? Years since quitting: 43.5  ?Smokeless Tobacco Never  ? ? ?Goals Met:  ?Proper associated with RPD/PD & O2 Sat ?Independence with exercise equipment ?Exercise tolerated well ?No report of concerns or symptoms today ?Strength training completed today ? ?Goals Unmet:  ?Not Applicable ? ?Comments: Service time is from 1024 to 1146.  ? ? ?Dr. Rodman Pickle is Medical Director for Pulmonary Rehab at [facility].  ?

## 2021-11-16 ENCOUNTER — Ambulatory Visit: Payer: Medicare Other | Admitting: Family Medicine

## 2021-11-17 ENCOUNTER — Other Ambulatory Visit: Payer: Self-pay

## 2021-11-17 ENCOUNTER — Encounter (HOSPITAL_COMMUNITY)
Admission: RE | Admit: 2021-11-17 | Discharge: 2021-11-17 | Disposition: A | Discharge status: Home / Self Care | Payer: Medicare Other | Source: Ambulatory Visit | Attending: Pulmonary Disease | Admitting: Pulmonary Disease

## 2021-11-17 DIAGNOSIS — J849 Interstitial pulmonary disease, unspecified: Secondary | ICD-10-CM

## 2021-11-17 NOTE — Progress Notes (Signed)
Daily Session Note ? ?Patient Details  ?Name: Todd Parsons ?MRN: [id number] ?Date of Birth: Oct 31, 1945 ?Referring Provider:   ?Flowsheet Row PULMONARY REHAB OTHER RESP ORIENTATION from 09/21/2021 in [place]  ?Referring Provider Mannam  ? ?  ? ? ?Encounter Date: 11/17/2021 ? ?Check In: ? Session Check In - 11/17/21 1157   ? ?  ? Check-In  ? Supervising physician immediately available to respond to emergencies [facility] immediately available   ? Physician(s) Dr. Alfredia Ferguson   ? Location [facility]   ? Staff Present Elmon Else, MS, ACSM-CEP, Exercise Physiologist;Lisa Ysidro Evert, RN;Other;Carlette Wilber Oliphant, RN, BSN   ? Virtual Visit No   ? Medication changes reported     No   ? Fall or balance concerns reported    No   ? Tobacco Cessation No Change   ? Warm-up and Cool-down Performed as group-led instruction   ? Resistance Training Performed Yes   ? VAD Patient? No   ? PAD/SET Patient? No   ?  ? Pain Assessment  ? Currently in Pain? No/denies   ? Multiple Pain Sites No   ? ?  ?  ? ?  ? ? ?Capillary Blood Glucose: ?No results found for this or any previous visit (from the past 24 hour(s)). ? ? ? ?Social History  ? ?Tobacco Use  ?Smoking Status Former  ? Years: 17.00  ? Types: Cigarettes  ? Quit date: 04/24/1978  ? Years since quitting: 43.5  ?Smokeless Tobacco Never  ? ? ?Goals Met:  ?Independence with exercise equipment ?Exercise tolerated well ?No report of concerns or symptoms today ?Strength training completed today ? ?Goals Unmet:  ?Not Applicable ? ?Comments: Service time is from 1015 to 43. ? ? ? ?Dr. Rodman Pickle is Medical Director for Pulmonary Rehab at [facility].  ?

## 2021-11-19 ENCOUNTER — Other Ambulatory Visit: Payer: Self-pay

## 2021-11-19 ENCOUNTER — Encounter (HOSPITAL_COMMUNITY)
Admission: RE | Admit: 2021-11-19 | Discharge: 2021-11-19 | Disposition: A | Discharge status: Home / Self Care | Payer: Medicare Other | Source: Ambulatory Visit | Attending: Pulmonary Disease | Admitting: Pulmonary Disease

## 2021-11-19 DIAGNOSIS — J849 Interstitial pulmonary disease, unspecified: Secondary | ICD-10-CM | POA: Diagnosis not present

## 2021-11-19 NOTE — Progress Notes (Signed)
Daily Session Note ? ?Patient Details  ?Name: Todd Parsons ?MRN: [id number] ?Date of Birth: Sep 19, 1945 ?Referring Provider:   ?Flowsheet Row PULMONARY REHAB OTHER RESP ORIENTATION from 09/21/2021 in [place]  ?Referring Provider Mannam  ? ?  ? ? ?Encounter Date: 11/19/2021 ? ?Check In: ? ? ?Capillary Blood Glucose: ?No results found for this or any previous visit (from the past 24 hour(s)). ? ? ? ?Social History  ? ?Tobacco Use  ?Smoking Status Former  ? Years: 17.00  ? Types: Cigarettes  ? Quit date: 04/24/1978  ? Years since quitting: 43.6  ?Smokeless Tobacco Never  ? ? ?Goals Met:  ?Independence with exercise equipment ?Exercise tolerated well ?No report of concerns or symptoms today ?Strength training completed today ? ?Goals Unmet:  ?Not Applicable ? ?Comments: Service time is from 1017 to 1139.  ? ? ?Dr. Rodman Pickle is Medical Director for Pulmonary Rehab at [facility].  ?

## 2021-11-23 ENCOUNTER — Encounter: Payer: Self-pay | Admitting: Family Medicine

## 2021-11-23 ENCOUNTER — Encounter: Payer: Self-pay | Admitting: Pulmonary Disease

## 2021-11-23 ENCOUNTER — Telehealth: Payer: Self-pay | Admitting: Pharmacist

## 2021-11-23 ENCOUNTER — Ambulatory Visit: Payer: Medicare Other | Admitting: Pulmonary Disease

## 2021-11-23 ENCOUNTER — Ambulatory Visit (INDEPENDENT_AMBULATORY_CARE_PROVIDER_SITE_OTHER): Payer: Medicare Other | Admitting: Family Medicine

## 2021-11-23 ENCOUNTER — Other Ambulatory Visit: Payer: Self-pay

## 2021-11-23 VITALS — BP 116/60 | HR 61 | Temp 98.0°F | Ht 67.0 in | Wt 174.4 lb

## 2021-11-23 VITALS — BP 133/85 | HR 61 | Resp 16 | Ht 67.0 in | Wt 174.0 lb

## 2021-11-23 DIAGNOSIS — J849 Interstitial pulmonary disease, unspecified: Secondary | ICD-10-CM

## 2021-11-23 DIAGNOSIS — F39 Unspecified mood [affective] disorder: Secondary | ICD-10-CM

## 2021-11-23 DIAGNOSIS — E119 Type 2 diabetes mellitus without complications: Secondary | ICD-10-CM | POA: Diagnosis not present

## 2021-11-23 DIAGNOSIS — R634 Abnormal weight loss: Secondary | ICD-10-CM

## 2021-11-23 DIAGNOSIS — R413 Other amnesia: Secondary | ICD-10-CM | POA: Diagnosis not present

## 2021-11-23 DIAGNOSIS — J84112 Idiopathic pulmonary fibrosis: Secondary | ICD-10-CM | POA: Diagnosis not present

## 2021-11-23 DIAGNOSIS — Z5181 Encounter for therapeutic drug level monitoring: Secondary | ICD-10-CM | POA: Diagnosis not present

## 2021-11-23 DIAGNOSIS — R0602 Shortness of breath: Secondary | ICD-10-CM

## 2021-11-23 DIAGNOSIS — R3915 Urgency of urination: Secondary | ICD-10-CM | POA: Diagnosis not present

## 2021-11-23 DIAGNOSIS — N401 Enlarged prostate with lower urinary tract symptoms: Secondary | ICD-10-CM

## 2021-11-23 DIAGNOSIS — I10 Essential (primary) hypertension: Secondary | ICD-10-CM | POA: Diagnosis not present

## 2021-11-23 DIAGNOSIS — E1159 Type 2 diabetes mellitus with other circulatory complications: Secondary | ICD-10-CM

## 2021-11-23 LAB — POCT UA - MICROALBUMIN

## 2021-11-23 LAB — COMPREHENSIVE METABOLIC PANEL
ALT: 14 U/L (ref 0–53)
AST: 21 U/L (ref 0–37)
Albumin: 4.1 g/dL (ref 3.5–5.2)
Alkaline Phosphatase: 64 U/L (ref 39–117)
BUN: 16 mg/dL (ref 6–23)
CO2: 29 mEq/L (ref 19–32)
Calcium: 9.6 mg/dL (ref 8.4–10.5)
Chloride: 101 mEq/L (ref 96–112)
Creatinine, Ser: 0.99 mg/dL (ref 0.40–1.50)
GFR: 73.91 mL/min (ref >60.00)
Glucose, Bld: 115 mg/dL — ABNORMAL HIGH (ref 70–99)
Potassium: 3.9 mEq/L (ref 3.5–5.1)
Sodium: 137 mEq/L (ref 135–145)
Total Bilirubin: 0.4 mg/dL (ref 0.2–1.2)
Total Protein: 6.6 g/dL (ref 6.0–8.3)

## 2021-11-23 LAB — POCT GLYCOSYLATED HEMOGLOBIN (HGB A1C): Hemoglobin A1C: 5.7 % — AB (ref 4.0–5.6)

## 2021-11-23 MED ORDER — FLUOXETINE HCL 20 MG PO CAPS: 40.0000 mg | ORAL_CAPSULE | Freq: Every day | ORAL | 0 refills | Status: DC

## 2021-11-23 MED ORDER — PIRFENIDONE 801 MG PO TABS: 801.0000 mg | ORAL_TABLET | Freq: Three times a day (TID) | ORAL | 1 refills | Status: DC

## 2021-11-23 NOTE — Assessment & Plan Note (Addendum)
Weight has been fairly stable over the last year.  His wife feels like he is actually eating less but again it has been stable she says he is really not wanting to eat much meat lately so just encouraged him to make sure he is getting adequate protein. ?

## 2021-11-23 NOTE — Assessment & Plan Note (Signed)
They increased his tamsulosin to twice a day he does feel like its been helpful.  Also on finasteride. ?

## 2021-11-23 NOTE — Assessment & Plan Note (Signed)
Has noticed an increase in irritability and being short.  We will increase fluoxetine to 40 mg.  He says he just gotten a new 90-day prescription so he will double up for the next 4 to 6 weeks to see if this is helpful and then we can always send in a new prescription ?

## 2021-11-23 NOTE — Patient Instructions (Signed)
I am glad you are doing well with your breathing ?Continue exercise therapy ?We will check comprehensive metabolic panel and proBNP today ?Will order high-res CT and PFTs in 6 months and follow-up in clinic. ?

## 2021-11-23 NOTE — Progress Notes (Signed)
? ?      Todd Parsons    [id number]    03/11/1946 ? ?Primary Care Physician:Metheney, Rene Kocher, MD ? ?Referring Physician: Hali Marry, MD ?[address] ?[address] ?[place],  [place] [address] ? ?Chief complaint:  ?Follow-up for ILD ?Started Esbriet November 2022 ? ?HPI: ?76 year old ex-smoker with history of a flutter, aortic valve replacement, aortic aneurysm repair, hyperlipidemia, diabetes ? ?Referred for evaluation of pulmonary fibrosis ?He was previously seen in 2018 by Dr. Elsworth Soho in the pulmonary clinic when he had a CT scan which showed pulmonary fibrosis.  He has been lost to follow-up since then. ?Complains of chronic dyspnea on exertion which has been stable over several years.  No symptoms at rest.  Denies any cough, sputum production, fevers, chills ? ?Pets: Has a dog ?Occupation: Retired Advice worker for [employer] ?Exposures: No known exposures.  No mold, hot tub, Jacuzzi.  No feather pillows or comforter ?ILD questionnaire 06/17/2021-negative for exposures. ?Smoking history: 17-pack-year smoker.  Quit in 1979 ?Travel history: Originally from [place].  No significant recent travel ?Relevant family history: No family history of lung disease ? ?Interim history: ?Continues on Esbriet without any issue ?He is attending cardiopulmonary rehab.  Does note reduced oxygen levels when he exercises but he is reluctant to go on oxygen ? ?States that breathing is stable without any issues ? ?Outpatient Encounter Medications as of 11/23/2021  ?Medication Sig  ? amLODipine (NORVASC) 5 MG tablet Take 10 mg by mouth.  ? aspirin EC 81 MG tablet Take 81 mg by mouth daily.  ? atorvastatin (LIPITOR) 40 MG tablet TAKE 1 TABLET BY MOUTH AT  BEDTIME  ? cetirizine (ZYRTEC) 10 MG tablet Take 10 mg by mouth as needed for allergies.  ? fenofibrate 160 MG tablet TAKE 1 TABLET BY MOUTH  DAILY  ? finasteride (PROSCAR) 5 MG tablet TAKE 1 TABLET BY MOUTH  DAILY  ? FLUoxetine (PROZAC) 20 MG capsule TAKE 1  CAPSULE BY MOUTH  DAILY  ? fluticasone (FLONASE) 50 MCG/ACT nasal spray Place 2 sprays into both nostrils daily as needed for allergies or rhinitis.  ? metFORMIN (GLUCOPHAGE-XR) 500 MG 24 hr tablet Take 1 tablet (500 mg total) by mouth daily with breakfast.  ? metoprolol succinate (TOPROL-XL) 50 MG 24 hr tablet Take 1 tablet (50 mg total) by mouth daily. Take with or immediately following a meal.  ? PFIZER-BIONT COVID-19 VAC-TRIS SUSP injection   ? Pirfenidone (ESBRIET) 801 MG TABS Take 801 mg by mouth with breakfast, with lunch, and with evening meal.  ? tamsulosin (FLOMAX) 0.4 MG CAPS capsule Take 1 capsule (0.4 mg total) by mouth daily after supper. (Patient taking differently: Take 0.4 mg by mouth in the morning and at bedtime.)  ? [DISCONTINUED] solifenacin (VESICARE) 5 MG tablet Take 5 mg by mouth daily.  ? ?No facility-administered encounter medications on file as of 11/23/2021.  ? ? ?Allergies as of 11/23/2021  ? (No Known Allergies)  ? ? ?Physical Exam: ?Blood pressure 116/60, pulse 61, temperature 98 ?F (36.7 ?C), temperature source Oral, height '5\' 7"'$  (1.702 m), weight 174 lb 6.4 oz (79.1 kg), SpO2 92 %. ?Gen:      No acute distress ?HEENT:  EOMI, sclera anicteric ?Neck:     No masses; no thyromegaly ?Lungs:    Bibasal crackles ?CV:         Regular rate and rhythm; no murmurs ?Abd:      + bowel sounds; soft, non-tender; no palpable masses, no  distension ?Ext:    No edema; adequate peripheral perfusion ?Skin:      Warm and dry; no rash ?Neuro: alert and oriented x 3 ?Psych: normal mood and affect  ? ?Data Reviewed: ?Imaging: ?High-res CT 07/22/2017-pulmonary fibrosis, probable UIP ?High-res CT 09/09/2020-slight worsening of pulmonary fibrosis and probable UIP pattern ?I have reviewed the images personally. ? ?PFTs: ?08/24/2017 ?FVC 2.02 [50%], FEV1 1.89 [69%], F/F 94, TLC 3.62 [56%], DLCO 15.57 [5%] ?Severe restriction and diffusion defect ? ?Labs: ?CTD, hypersensitivity serologies 08/24/2017-negative ANA,  CCP, negative hypersensitivity panel ? ?Assessment:  ?Pulmonary fibrosis ?He had CT scans on record which shows pulmonary fibrosis slightly progressive.  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  There may be a hint of honeycombing at the base as well by my review.  Note CT reports from even before from the [place] that shows reticulation so the process appears to be progressive ? ?Although the initial scan was read as hypersensitivity pneumonitis he does not have any known exposures or signs and symptoms of connective tissue disease.  CTD serologies are negative ?PFTs reviewed with worsening restriction ? 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  Check comprehensive metabolic panel and proBNP today ?Continue exercise at rehab ? ?Check high-res CT and PFTs in 6 months for monitoring ? ?Cough, postnasal drip ?He will try over-the-counter antihistamine such as Benadryl, chlorpheniramine and steroid nasal spray Nasonex ? ?Plan/Recommendations: ?Continue Esbriet ?Follow labs ?Pulmonary rehab ?High-res CT and PFTs in 6 months ? ?Marshell Garfinkel MD ? Pulmonary and Critical Care ?11/23/2021, 10:09 AM ? ?CC: Hali Marry, * ? ?  ?

## 2021-11-23 NOTE — Assessment & Plan Note (Signed)
Well controlled. Continue current regimen. Follow up in  4 months.   

## 2021-11-23 NOTE — Progress Notes (Signed)
? ?      Todd Parsons    [id number]    1945/11/22 ? ?Primary Care Physician:Metheney, Rene Kocher, MD ? ?Referring Physician: Hali Marry, MD ?[address] ?[address] ?[place],   [address] ? ?Chief complaint:  ?Follow-up for ILD ?Started Esbriet November 2022 ? ?HPI: ?76 year old ex-smoker with history of a flutter, aortic valve replacement, aortic aneurysm repair, hyperlipidemia, diabetes ? ?Referred for evaluation of pulmonary fibrosis ?He was previously seen in 2018 by Dr. Elsworth Soho in the pulmonary clinic when he had a CT scan which showed pulmonary fibrosis.  He has been lost to follow-up since then. ?Complains of chronic dyspnea on exertion which has been stable over several years.  No symptoms at rest.  Denies any cough, sputum production, fevers, chills ? ?Pets: Has a dog ?Occupation: Retired Advice worker for [employer] ?Exposures: No known exposures.  No mold, hot tub, Jacuzzi.  No feather pillows or comforter ?ILD questionnaire 06/17/2021-negative for exposures. ?Smoking history: 17-pack-year smoker.  Quit in 1979 ?Travel history: Originally from [place].  No significant recent travel ?Relevant family history: No family history of lung disease ? ?Interim history: ?Started Esbriet last month and is at full dose now.  He is tolerating it well without any issue ?He has been referred to pulmonary rehab and is on the wait list ? ?Has mild cough associated with postnasal drip.  Denies any dyspnea ? ?Outpatient Encounter Medications as of 11/23/2021  ?Medication Sig  ? amLODipine (NORVASC) 5 MG tablet Take 10 mg by mouth.  ? aspirin EC 81 MG tablet Take 81 mg by mouth daily.  ? atorvastatin (LIPITOR) 40 MG tablet TAKE 1 TABLET BY MOUTH AT  BEDTIME  ? cetirizine (ZYRTEC) 10 MG tablet Take 10 mg by mouth as needed for allergies.  ? fenofibrate 160 MG tablet TAKE 1 TABLET BY MOUTH  DAILY  ? finasteride (PROSCAR) 5 MG tablet TAKE 1 TABLET BY MOUTH  DAILY  ? FLUoxetine (PROZAC) 20 MG  capsule TAKE 1 CAPSULE BY MOUTH  DAILY  ? fluticasone (FLONASE) 50 MCG/ACT nasal spray Place 2 sprays into both nostrils daily as needed for allergies or rhinitis.  ? metFORMIN (GLUCOPHAGE-XR) 500 MG 24 hr tablet Take 1 tablet (500 mg total) by mouth daily with breakfast.  ? metoprolol succinate (TOPROL-XL) 50 MG 24 hr tablet Take 1 tablet (50 mg total) by mouth daily. Take with or immediately following a meal.  ? PFIZER-BIONT COVID-19 VAC-TRIS SUSP injection   ? Pirfenidone (ESBRIET) 801 MG TABS Take 801 mg by mouth with breakfast, with lunch, and with evening meal.  ? tamsulosin (FLOMAX) 0.4 MG CAPS capsule Take 1 capsule (0.4 mg total) by mouth daily after supper. (Patient taking differently: Take 0.4 mg by mouth in the morning and at bedtime.)  ? [DISCONTINUED] solifenacin (VESICARE) 5 MG tablet Take 5 mg by mouth daily.  ? ?No facility-administered encounter medications on file as of 11/23/2021.  ? ? ?Allergies as of 11/23/2021  ? (No Known Allergies)  ? ? ?Physical Exam: ?Blood pressure 116/64, pulse 68, temperature 98.4 ?F (36.9 ?C), temperature source Oral, height '5\' 7"'$  (1.702 m), weight 174 lb 3.2 oz (79 kg), SpO2 94 %. ?Gen:      No acute distress ?HEENT:  EOMI, sclera anicteric ?Neck:     No masses; no thyromegaly ?Lungs:    Bibasal crackles ?CV:         Regular rate and rhythm; no murmurs ?Abd:      + bowel  sounds; soft, non-tender; no palpable masses, no distension ?Ext:    No edema; adequate peripheral perfusion ?Skin:      Warm and dry; no rash ?Neuro: alert and oriented x 3 ?Psych: normal mood and affect  ? ?Data Reviewed: ?Imaging: ?High-res CT 07/22/2017-pulmonary fibrosis, probable UIP ?High-res CT 09/09/2020-slight worsening of pulmonary fibrosis and probable UIP pattern ?I have reviewed the images personally. ? ?PFTs: ?08/24/2017 ?FVC 2.02 [50%], FEV1 1.89 [69%], F/F 94, TLC 3.62 [56%], DLCO 15.57 [5%] ?Severe restriction and diffusion defect ? ?Labs: ?CTD, hypersensitivity serologies  08/24/2017-negative ANA, CCP, negative hypersensitivity panel ? ?Assessment:  ?Pulmonary fibrosis ?He had CT scans on record which shows pulmonary fibrosis slightly progressive.  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  There may be a hint of honeycombing at the base as well by my review.  Note CT reports from even before from the [place] that shows reticulation so the process appears to be progressive ? ?Although the initial scan was read as hypersensitivity pneumonitis he does not have any known exposures or signs and symptoms of connective tissue disease.  CTD serologies are negative ?PFTs reviewed with worsening restriction ? 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  Check comprehensive metabolic panel today and monthly ?He is awaiting initiation of pulmonary rehab.  Referral has already been made ? ?Cough, postnasal drip ?He will try over-the-counter antihistamine such as Benadryl, chlorpheniramine and steroid nasal spray Nasonex ? ?Plan/Recommendations: ?Continue Esbriet ?Follow labs ?Pulmonary rehab ? ?Marshell Garfinkel MD ?[facility] ?11/23/2021, 10:12 AM ? ?CC: Hali Marry, * ? ?  ?

## 2021-11-23 NOTE — Telephone Encounter (Addendum)
Refill for Esbriet was sent for 90 days but  [facility] pharmacy may only dispense 30 day supplies due to it being specialty medication. ? ?Refill sent for Esbriet as patient had updated LFTs and wnl today ? ?Knox Saliva, PharmD, MPH, BCPS ?Clinical Pharmacist (Rheumatology and Pulmonology) ? ?----- Message from Marshell Garfinkel, MD sent at 11/23/2021 10:26 AM EDT ----- ?He wants to get 90-day supply for Esbriet.  Can we arrange that?.  Thanks. ?

## 2021-11-23 NOTE — Progress Notes (Signed)
? ?Established Patient Office Visit ? ?Subjective:  ?Patient ID: Todd Parsons, male    DOB: 1946/08/04  Age: 76 y.o. MRN: [id number] ? ?CC:  ?Chief Complaint  ?Patient presents with  ? Diabetes  ?  Follow up   ? Insomnia  ?  Difficulty staying asleep for several months   ? Loss of Appetite   ?  Patient states he gets full fast and is only able to eat very small amounts.   ? Diabetes Eye Exam  ?  Done 10/2021 at [facility] in [place]   ? ? ?HPI ?Todd Parsons presents for 6 mo f/u ? ?Diabetes - no hypoglycemic events. No wounds or sores that are not healing well. No increased thirst or urination. Checking glucose at home. Taking medications as prescribed without any side effects. ? ?Reports difficulty staying asleep for several months now.  He says that recently the PA at the [place] actually stopped his Vesicare to see if maybe it was actually causing increased disruption to sleep because he was getting up with frequent nocturia.  He says it for over the last couple of nights since holding it he actually feels like he is done better.  But a lot of times once he gets up he will stay up and reviewed for anywhere from 30 to 60 minutes but can usually go back asleep. ? ?Still reports decreased appetite.  Gets full very quickly and just eating small amounts at one time. ? ?Brain MRI performed May 2022 showing small remote right occipital parietal infarct.  Also just showed some limited chronic small vessel ischemic change in the cerebral white matter.  Normal brain volume.  No other abnormal findings. ? ?Done 10/2021 at [facility] in [place]  ? ? ? ?Past Medical History:  ?Diagnosis Date  ? Arthritis   ? osteoarthritis  ? Bicuspid aortic valve   ? Cataract   ? Coronary artery disease   ? Dysrhythmia   ? atrial flutter  ? Echocardiogram with ECG monitoring 01/12/2010  ? 60-65% EF  ? Hyperlipidemia   ? Hypertension   ? Pre-diabetes   ? ? ?Past Surgical History:  ?Procedure Laterality Date  ? A-FLUTTER ABLATION   2017  ? ACROMIO-CLAVICULAR JOINT REPAIR Right 1980s  ?    ? apnea device implant    ? then removed.    ? CARDIAC CATHETERIZATION  07/2015  ? CARDIAC VALVE REPLACEMENT  08/05/2015  ? Aortic valve  ? COLONOSCOPY    ? CORONARY ARTERY BYPASS GRAFT  08/05/2015  ? left knee arthoscopy    ? NOSE SURGERY  late 50's  ? to fix a broken nose  ? TONSILECTOMY, ADENOIDECTOMY, BILATERAL MYRINGOTOMY AND TUBES    ? TOTAL KNEE ARTHROPLASTY Right 02/28/2017  ? Procedure: RIGHT TOTAL KNEE ARTHROPLASTY;  Surgeon: Dorna Leitz, MD;  Location: [place];  Service: Orthopedics;  Laterality: Right;  ? ? ?Family History  ?Problem Relation Age of Onset  ? Stroke Mother 68  ? Cancer Mother   ?     upper palate  ? Heart disease Father 24  ?     bypass surgery  ? Colon cancer Neg Hx   ? Colon polyps Neg Hx   ? Esophageal cancer Neg Hx   ? Rectal cancer Neg Hx   ? Stomach cancer Neg Hx   ? ? ?Social History  ? ?Socioeconomic History  ? Marital status: Married  ?  Spouse name:  Todd Parsons  ? Number of children: 3  ?  Years of education: 7  ? Highest education level: Master's degree (e.g., MA, MS, MEng, MEd, MSW, MBA)  ?Occupational History  ? Occupation: [employer] [place]  ?  Comment: retired  ?Tobacco Use  ? Smoking status: Former  ?  Years: 17.00  ?  Types: Cigarettes  ?  Quit date: 04/24/1978  ?  Years since quitting: 43.6  ? Smokeless tobacco: Never  ?Vaping Use  ? Vaping Use: Never used  ?Substance and Sexual Activity  ? Alcohol use: Yes  ?  Alcohol/week: 2.0 standard drinks  ?  Types: 2 Standard drinks or equivalent per week  ?  Comment: socially  ? Drug use: No  ? Sexual activity: Yes  ?Other Topics Concern  ? Not on file  ?Social History Narrative  ? Had 2 surgeries in the past 2 years ago and has not got back into exercising. Will start back with doing this. Wife retiring in April.  ? ?Social Determinants of Health  ? ?Financial Resource Strain: Low Risk   ? Difficulty of Paying Living Expenses: Not hard at all  ?Food Insecurity: No Food  Insecurity  ? Worried About Charity fundraiser in the Last Year: Never true  ? Ran Out of Food in the Last Year: Never true  ?Transportation Needs: No Transportation Needs  ? Lack of Transportation (Medical): No  ? Lack of Transportation (Non-Medical): No  ?Physical Activity: Inactive  ? Days of Exercise per Week: 0 days  ? Minutes of Exercise per Session: 0 min  ?Stress: No Stress Concern Present  ? Feeling of Stress : Not at all  ?Social Connections: Moderately Isolated  ? Frequency of Communication with Friends and Family: More than three times a week  ? Frequency of Social Gatherings with Friends and Family: Once a week  ? Attends Religious Services: Never  ? Active Member of Clubs or Organizations: No  ? Attends Archivist Meetings: Never  ? Marital Status: Married  ?Intimate Partner Violence: Not At Risk  ? Fear of Current or Ex-Partner: No  ? Emotionally Abused: No  ? Physically Abused: No  ? Sexually Abused: No  ? ? ?Outpatient Medications Prior to Visit  ?Medication Sig Dispense Refill  ? amLODipine (NORVASC) 5 MG tablet Take 10 mg by mouth.    ? aspirin EC 81 MG tablet Take 81 mg by mouth daily.    ? atorvastatin (LIPITOR) 40 MG tablet TAKE 1 TABLET BY MOUTH AT  BEDTIME 90 tablet 3  ? cetirizine (ZYRTEC) 10 MG tablet Take 10 mg by mouth as needed for allergies.    ? fenofibrate 160 MG tablet TAKE 1 TABLET BY MOUTH  DAILY 90 tablet 3  ? finasteride (PROSCAR) 5 MG tablet TAKE 1 TABLET BY MOUTH  DAILY 90 tablet 3  ? fluticasone (FLONASE) 50 MCG/ACT nasal spray Place 2 sprays into both nostrils daily as needed for allergies or rhinitis. 48 g 3  ? metFORMIN (GLUCOPHAGE-XR) 500 MG 24 hr tablet Take 1 tablet (500 mg total) by mouth daily with breakfast. 1 tablet 0  ? metoprolol succinate (TOPROL-XL) 50 MG 24 hr tablet Take 1 tablet (50 mg total) by mouth daily. Take with or immediately following a meal. 90 tablet 1  ? Pirfenidone (ESBRIET) 801 MG TABS Take 801 mg by mouth with breakfast, with lunch,  and with evening meal. 270 tablet 1  ? tamsulosin (FLOMAX) 0.4 MG CAPS capsule Take 1 capsule (0.4 mg total) by mouth daily after supper. (Patient taking differently:  Take 0.4 mg by mouth in the morning and at bedtime.) 90 capsule 3  ? FLUoxetine (PROZAC) 20 MG capsule TAKE 1 CAPSULE BY MOUTH  DAILY 90 capsule 3  ? PFIZER-BIONT COVID-19 VAC-TRIS SUSP injection     ? ?No facility-administered medications prior to visit.  ? ? ?No Known Allergies ? ?ROS ?Review of Systems ? ?  ?Objective:  ?  ?Physical Exam ?Constitutional:   ?   Appearance: Normal appearance. He is well-developed.  ?HENT:  ?   Head: Normocephalic and atraumatic.  ?Cardiovascular:  ?   Rate and Rhythm: Normal rate and regular rhythm.  ?   Heart sounds: Normal heart sounds.  ?Pulmonary:  ?   Effort: Pulmonary effort is normal.  ?   Breath sounds: Normal breath sounds.  ?Skin: ?   General: Skin is warm and dry.  ?Neurological:  ?   Mental Status: He is alert and oriented to person, place, and time. Mental status is at baseline.  ?Psychiatric:     ?   Behavior: Behavior normal.  ? ? ?BP 133/85   Pulse 61   Resp 16   Ht '5\' 7"'$  (1.702 m)   Wt 174 lb (78.9 kg)   SpO2 96%   BMI 27.25 kg/m?  ?Wt Readings from Last 3 Encounters:  ?11/23/21 174 lb (78.9 kg)  ?11/23/21 174 lb 6.4 oz (79.1 kg)  ?11/10/21 172 lb 2.9 oz (78.1 kg)  ? ? ? ?Health Maintenance Due  ?Topic Date Due  ? Zoster Vaccines- Shingrix (1 of 2) Never done  ? OPHTHALMOLOGY EXAM  09/28/2019  ? COVID-19 Vaccine (5 - Booster for Pfizer series) 02/03/2021  ? TETANUS/TDAP  08/18/2021  ? ? ?There are no preventive care reminders to display for this patient. ? ?Lab Results  ?Component Value Date  ? TSH 1.470 09/09/2020  ? ?Lab Results  ?Component Value Date  ? WBC 8.7 12/25/2020  ? HGB 16.3 12/25/2020  ? HCT 48.7 12/25/2020  ? MCV 88 12/25/2020  ? PLT 243 12/25/2020  ? ?Lab Results  ?Component Value Date  ? NA 137 11/23/2021  ? K 3.9 11/23/2021  ? CO2 29 11/23/2021  ? GLUCOSE 115 (H) 11/23/2021  ?  BUN 16 11/23/2021  ? CREATININE 0.99 11/23/2021  ? BILITOT 0.4 11/23/2021  ? ALKPHOS 64 11/23/2021  ? AST 21 11/23/2021  ? ALT 14 11/23/2021  ? PROT 6.6 11/23/2021  ? ALBUMIN 4.1 11/23/2021  ? CALCIUM 9.6 03/20/2

## 2021-11-23 NOTE — Assessment & Plan Note (Signed)
A1c looks great today.  Continue with current regimen.  Follow-up in 3 to 4 months. ? ?

## 2021-11-24 ENCOUNTER — Encounter (HOSPITAL_COMMUNITY)
Admission: RE | Admit: 2021-11-24 | Discharge: 2021-11-24 | Disposition: A | Discharge status: Home / Self Care | Payer: Medicare Other | Source: Ambulatory Visit | Attending: Pulmonary Disease | Admitting: Pulmonary Disease

## 2021-11-24 VITALS — Wt 170.9 lb

## 2021-11-24 DIAGNOSIS — J849 Interstitial pulmonary disease, unspecified: Secondary | ICD-10-CM

## 2021-11-24 LAB — PRO B NATRIURETIC PEPTIDE: NT-Pro BNP: 281 pg/mL (ref 0–486)

## 2021-11-24 NOTE — Progress Notes (Signed)
Daily Session Note ? ?Patient Details  ?Name: Demitrios Molyneux Sanders ?MRN: [id number] ?Date of Birth: [birth year] ?Referring Provider:   ?Flowsheet Row PULMONARY REHAB OTHER RESP ORIENTATION from 09/21/2021 in [place]  ?Referring Provider Mannam  ? ?  ? ? ?Encounter Date: 11/24/2021 ? ?Check In: ? Session Check In - 11/24/21 1127   ? ?  ? Check-In  ? Supervising physician immediately available to respond to emergencies [facility] immediately available   ? Physician(s) Dr. Cruzita Lederer   ? Location [facility]   ? Staff Present Rosebud Poles, RN, Quentin Ore, MS, ACSM-CEP, Exercise Physiologist;David Lilyan Punt, MS, ACSM-CEP, CCRP, Exercise Physiologist;Carlette Wilber Oliphant, Therapist, sports, BSN   ? Virtual Visit No   ? Medication changes reported     No   ? Fall or balance concerns reported    No   ? Tobacco Cessation No Change   ? Warm-up and Cool-down Performed as group-led instruction   ? Resistance Training Performed Yes   ? VAD Patient? No   ? PAD/SET Patient? No   ?  ? Pain Assessment  ? Currently in Pain? No/denies   ? Multiple Pain Sites No   ? ?  ?  ? ?  ? ? ?Capillary Blood Glucose: ?Results for orders placed or performed in visit on 11/23/21 (from the past 24 hour(s))  ?POCT HgB A1C     Status: Abnormal  ? Collection Time: 11/23/21  3:36 PM  ?Result Value Ref Range  ? Hemoglobin A1C 5.7 (A) 4.0 - 5.6 %  ? HbA1c POC (<> result, manual entry)    ? HbA1c, POC (prediabetic range)    ? HbA1c, POC (controlled diabetic range)    ?POCT UA - Microalbumin     Status: Normal  ? Collection Time: 11/23/21  3:58 PM  ?Result Value Ref Range  ? Microalbumin Ur, POC 39m/L mg/L  ? Creatinine, POC 571mdl mg/dL  ? Albumin/Creatinine Ratio, Urine, POC <3037m   ? ? ? Exercise Prescription Changes - 11/24/21 1100   ? ?  ? Response to Exercise  ? Blood Pressure (Admit) 120/80   ? Blood Pressure (Exercise) 130/80   ? Blood Pressure (Exit) 102/58   ? Heart Rate (Admit) 58 bpm   ? Heart Rate (Exercise)  80 bpm   ? Heart Rate (Exit) 69 bpm   ? Oxygen Saturation (Admit) 100 %   ? Oxygen Saturation (Exercise) 91 %   ? Oxygen Saturation (Exit) 98 %   ? Rating of Perceived Exertion (Exercise) 12   ? Perceived Dyspnea (Exercise) 1   ? Duration Continue with 30 min of aerobic exercise without signs/symptoms of physical distress.   ? Intensity THRR unchanged   ?  ? Progression  ? Progression Continue to progress workloads to maintain intensity without signs/symptoms of physical distress.   ?  ? Resistance Training  ? Training Prescription Yes   ? Weight Blue bands   ? Reps 10-15   ? Time 10 Minutes   ?  ? Oxygen  ? Oxygen Continuous   ? Liters 3   ?  ? NuStep  ? Level 5   ? SPM 90   ? Minutes 15   ? METs 4   ?  ? Track  ? Laps 21   ? Minutes 15   ? METs 3.44   ? ?  ?  ? ?  ? ? ?Social History  ? ?Tobacco Use  ?Smoking Status Former  ? Years: 17.00  ?  Types: Cigarettes  ? Quit date: 04/24/1978  ? Years since quitting: 43.6  ?Smokeless Tobacco Never  ? ? ?Goals Met:  ?Proper associated with RPD/PD & O2 Sat ?Independence with exercise equipment ?Exercise tolerated well ?No report of concerns or symptoms today ?Strength training completed today ? ?Goals Unmet:  ?Not Applicable ? ?Comments: Service time is from 1020 to 1136.  ? ? ?Dr. Rodman Pickle is Medical Director for Pulmonary Rehab at [facility].  ?

## 2021-11-26 ENCOUNTER — Encounter (HOSPITAL_COMMUNITY)
Admission: RE | Admit: 2021-11-26 | Discharge: 2021-11-26 | Disposition: A | Discharge status: Home / Self Care | Payer: Medicare Other | Source: Ambulatory Visit | Attending: Pulmonary Disease | Admitting: Pulmonary Disease

## 2021-11-26 ENCOUNTER — Other Ambulatory Visit: Payer: Self-pay

## 2021-11-26 DIAGNOSIS — J849 Interstitial pulmonary disease, unspecified: Secondary | ICD-10-CM

## 2021-11-26 NOTE — Progress Notes (Signed)
Daily Session Note ? ?Patient Details  ?Name: Todd Parsons ?MRN: [id number] ?Date of Birth: October 19, 1945 ?Referring Provider:   ?Flowsheet Row PULMONARY REHAB OTHER RESP ORIENTATION from 09/21/2021 in [place]  ?Referring Provider Mannam  ? ?  ? ? ?Encounter Date: 11/26/2021 ? ?Check In: ? Session Check In - 11/26/21 1121   ? ?  ? Check-In  ? Supervising physician immediately available to respond to emergencies [facility] immediately available   ? Physician(s) Dr. Pietro Cassis   ? Location [facility]   ? Staff Present Rosebud Poles, RN, Quentin Ore, MS, ACSM-CEP, Exercise Physiologist;Jetta Gilford Rile BS, ACSM EP-C, Exercise Physiologist   ? Virtual Visit No   ? Medication changes reported     No   ? Fall or balance concerns reported    No   ? Tobacco Cessation No Change   ? Warm-up and Cool-down Performed as group-led instruction   ? Resistance Training Performed Yes   ? VAD Patient? No   ? PAD/SET Patient? No   ?  ? Pain Assessment  ? Currently in Pain? No/denies   ? Multiple Pain Sites No   ? ?  ?  ? ?  ? ? ?Capillary Blood Glucose: ?No results found for this or any previous visit (from the past 24 hour(s)). ? ? ? ?Social History  ? ?Tobacco Use  ?Smoking Status Former  ? Years: 17.00  ? Types: Cigarettes  ? Quit date: 04/24/1978  ? Years since quitting: 43.6  ?Smokeless Tobacco Never  ? ? ?Goals Met:  ?Proper associated with RPD/PD & O2 Sat ?Exercise tolerated well ?No report of concerns or symptoms today ? ?Goals Unmet:  ?Not Applicable ? ?Comments: Service time is from 0920 to 64. Completed post 6 MWT. ? ? ?Dr. Rodman Pickle is Medical Director for Pulmonary Rehab at [facility].  ?

## 2021-12-01 NOTE — Progress Notes (Signed)
Discharge Progress Report ? ?Patient Details  ?Name: Todd Parsons ?MRN: [id number] ?Date of Birth: [birth year] ?Referring Provider:   ?Flowsheet Row PULMONARY REHAB OTHER RESP ORIENTATION from 09/21/2021 in [place]  ?Referring Provider Mannam  ? ?  ? ? ? ?Number of Visits: 17 ? ?Reason for Discharge:  ?Patient has met program and personal goals. ? ?Smoking History:  ?Social History  ? ?Tobacco Use  ?Smoking Status Former  ? Years: 17.00  ? Types: Cigarettes  ? Quit date: 04/24/1978  ? Years since quitting: 43.6  ?Smokeless Tobacco Never  ? ? ?Diagnosis:  ?ILD (interstitial lung disease) ([place]) ? ?ADL UCSD: ? Pulmonary Assessment Scores   ? ? [place] Name 09/21/21 1109 11/24/21 1550 11/26/21 1554  ?  ? ADL UCSD  ? ADL Phase Entry Entry --  ? SOB Score total 11 5 --  ?  ? CAT Score  ? CAT Score 9 5 --  ?  ? mMRC Score  ? mMRC Score 1 -- 0  ? ?  ?  ? ?  ? ? ?Initial Exercise Prescription: ? Initial Exercise Prescription - 09/22/21 0700   ? ?  ? Date of Initial Exercise RX and Referring Provider  ? Date 09/22/21   ? Referring Provider Mannam   ? Expected Discharge Date 11/26/21   ?  ? Oxygen  ? Oxygen Continuous   ? Liters 1   ? Maintain Oxygen Saturation 88% or higher   ?  ? NuStep  ? Level 1   ? SPM 70   ? Minutes 15   ?  ? Track  ? Minutes 15   ? METs 1.75   ?  ? Prescription Details  ? Frequency (times per week) 2   ? Duration Progress to 30 minutes of continuous aerobic without signs/symptoms of physical distress   ?  ? Intensity  ? THRR 40-80% of Max Heartrate 58-115   ? Ratings of Perceived Exertion 11-13   ? Perceived Dyspnea 0-4   ?  ? Progression  ? Progression Continue to progress workloads to maintain intensity without signs/symptoms of physical distress.   ?  ? Resistance Training  ? Training Prescription Yes   ? Weight blue bands   ? Reps 10-15   ? ?  ?  ? ?  ? ? ?Discharge Exercise Prescription (Final Exercise Prescription Changes): ? Exercise Prescription Changes - 11/24/21 1100    ? ?  ? Response to Exercise  ? Blood Pressure (Admit) 120/80   ? Blood Pressure (Exercise) 130/80   ? Blood Pressure (Exit) 102/58   ? Heart Rate (Admit) 58 bpm   ? Heart Rate (Exercise) 80 bpm   ? Heart Rate (Exit) 69 bpm   ? Oxygen Saturation (Admit) 100 %   ? Oxygen Saturation (Exercise) 91 %   ? Oxygen Saturation (Exit) 98 %   ? Rating of Perceived Exertion (Exercise) 12   ? Perceived Dyspnea (Exercise) 1   ? Duration Continue with 30 min of aerobic exercise without signs/symptoms of physical distress.   ? Intensity THRR unchanged   ?  ? Progression  ? Progression Continue to progress workloads to maintain intensity without signs/symptoms of physical distress.   ?  ? Resistance Training  ? Training Prescription Yes   ? Weight Blue bands   ? Reps 10-15   ? Time 10 Minutes   ?  ? Oxygen  ? Oxygen Continuous   ? Liters 3   ?  ?  NuStep  ? Level 5   ? SPM 90   ? Minutes 15   ? METs 4   ?  ? Track  ? Laps 21   ? Minutes 15   ? METs 3.44   ? ?  ?  ? ?  ? ? ?Functional Capacity: ? 6 Minute Walk   ? ? [place] Name 09/22/21 0739 11/26/21 1202  ?  ?  ? 6 Minute Walk  ? Phase Initial Discharge   ? Distance 800 feet 1590 feet   ? Distance % Change -- 98.75 %   ? Distance Feet Change -- 790 ft   ? Walk Time 6 minutes 6 minutes   ? # of Rest Breaks 1  3:30-4:30 0   ? MPH 1.52 3.01   ? METS 175 3.05   ? RPE 13 10   ? Perceived Dyspnea  2 1   ? VO2 Peak 6.14 10.69   ? Symptoms No No   ? Resting HR 52 bpm 58 bpm   ? Resting BP 115/72 116/68   ? Resting Oxygen Saturation  95 % 98 %   ? Exercise Oxygen Saturation  during 6 min walk 83 % 89 %   ? Max Ex. HR 115 bpm 83 bpm   ? Max Ex. BP 120/72 148/80   ? 2 Minute Post BP 120/70 132/70   ?  ? Interval HR  ? 1 Minute HR 58 71  Artifact from pt   ? 2 Minute HR 91 71   ? 3 Minute HR 114 71   ? 4 Minute HR 115 71   ? 5 Minute HR 112 71   ? 6 Minute HR 112 83   ? 2 Minute Post HR 60 69   ? Interval Heart Rate? Yes Yes   ?  ? Interval Oxygen  ? Interval Oxygen? Yes Yes   ? Baseline Oxygen  Saturation % 95 % 96 %   ? 1 Minute Oxygen Saturation % 92 % 96 %   ? 1 Minute Liters of Oxygen 0 L 3 L   ? 2 Minute Oxygen Saturation % 90 % 94 %   ? 2 Minute Liters of Oxygen 0 L 3 L   ? 3 Minute Oxygen Saturation % 87 % 91 %   ? 3 Minute Liters of Oxygen 0 L 3 L   ? 4 Minute Oxygen Saturation % 83 % 90 %   ? 4 Minute Liters of Oxygen 0 L  Increased to 1L 3 L   ? 5 Minute Oxygen Saturation % 94 % 89 %   ? 5 Minute Liters of Oxygen 1 L 3 L   ? 6 Minute Oxygen Saturation % 89 % 89 %   ? 6 Minute Liters of Oxygen 1 L 3 L   ? 2 Minute Post Oxygen Saturation % 93 % 95 %   ? 2 Minute Post Liters of Oxygen 1 L 3 L   ? ?  ?  ? ?  ? ? ?Psychological, QOL, Others - Outcomes: ?PHQ 2/9: ? ?  11/26/2021  ?  9:30 AM 09/21/2021  ? 10:58 AM 05/23/2021  ?  2:02 PM 12/25/2020  ? 12:49 PM 08/26/2020  ?  1:12 PM  ?Depression screen PHQ 2/9  ?Decreased Interest 0 0 0 0 0  ?Down, Depressed, Hopeless 0 0 0 0 0  ?PHQ - 2 Score 0 0 0 0 0  ?Altered sleeping 0 0     ?  Tired, decreased energy 0 0     ?Change in appetite 0 0     ?Feeling bad or failure about yourself  0 0     ?Trouble concentrating 0 0     ?Moving slowly or fidgety/restless 0 0     ?Suicidal thoughts 0 0     ?PHQ-9 Score 0 0     ?Difficult doing work/chores Not difficult at all Not difficult at all     ? ? ?Quality of Life: ? ? ?Personal Goals: ?Goals established at orientation with interventions provided to work toward goal. ? Personal Goals and Risk Factors at Admission - 09/21/21 1104   ? ?  ? Core Components/Risk Factors/Patient Goals on Admission  ? Improve shortness of breath with ADL's Yes   ? Intervention Provide education, individualized exercise plan and daily activity instruction to help decrease symptoms of SOB with activities of daily living.   ? Expected Outcomes Short Term: Improve cardiorespiratory fitness to achieve a reduction of symptoms when performing ADLs;Long Term: Be able to perform more ADLs without symptoms or delay the onset of symptoms   ? ?  ?  ? ?  ?   ? ?Personal Goals Discharge: ? Goals and Risk Factor Review   ? ? [place] Name 10/07/21 1657 11/04/21 1619  ?  ?  ?  ?  ? Core Components/Risk Factors/Patient Goals Review  ? Personal Goals Review Improve shortness of breath with ADL's;Develop more efficient breathing techniques such as purse lipped breathing and diaphragmatic breathing and practicing self-pacing with activity.;Increase knowledge of respiratory medications and ability to use respiratory devices properly. Improve shortness of breath with ADL's;Develop more efficient breathing techniques such as purse lipped breathing and diaphragmatic breathing and practicing self-pacing with activity.;Increase knowledge of respiratory medications and ability to use respiratory devices properly.     ? Review BJ is progressing slowly in his exercise but has attended only 4 sessions. He is improving his MET levels on the nustep and laps on the track. He is using 1 liter of oxygen with exercise His saturatios have been 89-97% with his lowest saturations being on the track. He seems to be enjoying the program and making connection with others in his class. BJ has progressed well using the nustep and walking the track for exercise.He is on level 5 on the nustep and walking 16-18 laps on the track. He reports the exercise to be very to fairly light and his SOB to be mild. We have has to use 2 liters of oxygen to maintain his oxygen saturations greater than 88%. He does not have oxygen at home, but we have notified Dr. Vaughan Browner that he has needed oxygen as he has exerted himself.     ? Expected Outcomes Foe BJ to continue to increase his workloads and MET levels, improve his SOB with ADL and use better breathung techniques. Continue to evaluate oxygen needs for exercise. For BJ to continue to progress with his exercise. Obtain oxygen for home use so his oxygen staurations maintain greater than 88% as he continues with exercise after discharge from the PR program.     ? ?  ?  ? ?   ? ? ?Exercise Goals and Review: ? Exercise Goals   ? ? [place] Name 09/21/21 1052 09/30/21 0916 10/26/21 1049  ?  ?  ?  ? Exercise Goals  ? Increase Physical Activity Yes Yes Yes    ? Intervention Provid

## 2021-12-08 ENCOUNTER — Encounter: Payer: Self-pay | Admitting: *Deleted

## 2022-01-04 ENCOUNTER — Ambulatory Visit: Payer: Medicare Other | Admitting: Nurse Practitioner

## 2022-01-04 ENCOUNTER — Encounter: Payer: Self-pay | Admitting: Nurse Practitioner

## 2022-01-04 VITALS — BP 116/64 | HR 60 | Temp 98.0°F | Ht 67.0 in | Wt 163.6 lb

## 2022-01-04 DIAGNOSIS — J84112 Idiopathic pulmonary fibrosis: Secondary | ICD-10-CM

## 2022-01-04 DIAGNOSIS — R634 Abnormal weight loss: Secondary | ICD-10-CM | POA: Diagnosis not present

## 2022-01-04 LAB — COMPREHENSIVE METABOLIC PANEL
ALT: 17 U/L (ref 0–53)
AST: 25 U/L (ref 0–37)
Albumin: 4.4 g/dL (ref 3.5–5.2)
Alkaline Phosphatase: 52 U/L (ref 39–117)
BUN: 24 mg/dL — ABNORMAL HIGH (ref 6–23)
CO2: 29 mEq/L (ref 19–32)
Calcium: 10.1 mg/dL (ref 8.4–10.5)
Chloride: 101 mEq/L (ref 96–112)
Creatinine, Ser: 0.96 mg/dL (ref 0.40–1.50)
GFR: 76.63 mL/min (ref >60.00)
Glucose, Bld: 102 mg/dL — ABNORMAL HIGH (ref 70–99)
Potassium: 4.4 mEq/L (ref 3.5–5.1)
Sodium: 138 mEq/L (ref 135–145)
Total Bilirubin: 0.6 mg/dL (ref 0.2–1.2)
Total Protein: 7.3 g/dL (ref 6.0–8.3)

## 2022-01-04 NOTE — Assessment & Plan Note (Signed)
Breathing stable overall.  Given anorexia and weight loss, made a shared decision to take a Es[place]riet holiday.  We will reevaluate in 2 weeks if appetite has returned and proceed with further work-up and if necessary.  CMET today with stable liver function.  ? ?Patient Instructions  ?[place] holiday for 2 weeks. We will discuss restarting therapy or continuing your holiday at your follow up ? ?Check CMET today  ? ?Frequent small meals/snacks that are high protein. Can also add Ensure drinks 2-3 times a day.  ? ?Follow up in 2 weeks with Dr. Vaughan Browner or Alanson Aly. If symptoms do not improve or worsen, please contact office for sooner follow up or seek emergency care. ? ? ?

## 2022-01-04 NOTE — Assessment & Plan Note (Signed)
Unintentional weight loss of 10 pounds in the last month with associated anorexia.  No significant GI side effects to note.  Will trial off of [place]riet for 2 weeks and start back at lower dose if appetite returns and weight stabilizes. ?

## 2022-01-04 NOTE — Progress Notes (Signed)
CMET with nl LFTs.

## 2022-01-04 NOTE — Patient Instructions (Signed)
[place] holiday for 2 weeks. We will discuss restarting therapy or continuing your holiday at your follow up ? ?Check CMET today  ? ?Frequent small meals/snacks that are high protein. Can also add Ensure drinks 2-3 times a day.  ? ?Follow up in 2 weeks with Dr. Vaughan Browner or Alanson Aly. If symptoms do not improve or worsen, please contact office for sooner follow up or seek emergency care. ?

## 2022-01-04 NOTE — Progress Notes (Signed)
? ?'@Patient'$  ID: Todd Parsons, male    DOB: 03-Aug-1946, 76 y.o.   MRN: [id number] ? ?Chief Complaint  ?Patient presents with  ? Follow-up  ?  Pt is here regarding esbreit. Pt has no appetite. Pt states that this has been going on for the last month. Pt has lost some weight since last visit.   ? ? ?Referring provider: ?Hali Marry, * ? ?HPI: ?76 year old male, former smoker followed for pulmonary fibrosis.  He is a patient of Dr. Matilde Bash and last seen in office on 11/23/2021.  Past medical history significant for a flutter, aortic valve replacement, aortic aneurysm repair, HLD, diabetes. ? ?TEST/EVENTS:  ?07/22/2017 HRCT chest: Pulmonary fibrosis, probable UIP ?08/24/2017 PFTs: FVC 50%, FEV1 69%, ratio 94, TLC 56%, DLCO 50%.  Severe restriction and diffusion defect ?08/24/2017: ANA, CCP, negative hypersensitivity panel ?09/09/2020 HRCT chest: Slight worsening of pulmonary fibrosis and probable UIP pattern ? ?11/23/2021: OV with Dr. Vaughan Browner.  On full dose Esbriet. Tolerating well without any issues.  Has been referred to pulmonary rehab and currently on the wait list.  Has a mild cough with associated postnasal drip.  Denies any DOE.  CMET with stable liver function. ? ?01/04/2022: Today-acute visit ?Patient presents today with wife for reports of weight loss and anorexia over the last month.  He has lost around 9 to 10 pounds.  Feels like he just does not have much of an appetite.  Has an aversion to meats especially.  Denies any nausea, diarrhea, hemoptysis, recent fevers, night sweats.  Breathing is overall stable without significant shortness of breath on exertion.  Cough is unchanged; uses Zyrtec and Flonase for trigger prevention and postnasal drainage control. ? ?No Known Allergies ? ?Immunization History  ?Administered Date(s) Administered  ? Fluad Quad(high Dose 65+) 05/23/2019, 06/20/2020, 07/07/2021  ? Influenza Split 05/19/2012  ? Influenza Whole 05/05/2011  ? Influenza, High Dose Seasonal PF  05/30/2017, 06/13/2018  ? Influenza,inj,Quad PF,6+ Mos 07/27/2013, 07/08/2014, 06/21/2015, 06/02/2016  ? Influenza-Unspecified 07/06/2015  ? PFIZER(Purple Top)SARS-COV-2 Vaccination 11/04/2019, 11/28/2019, 07/27/2020, 12/09/2020  ? Pneumococcal Conjugate-13 07/24/2014  ? Pneumococcal Polysaccharide-23 07/12/2010  ? Tdap 08/19/2011  ? Zoster, Live 06/11/2008  ? ? ?Past Medical History:  ?Diagnosis Date  ? Arthritis   ? osteoarthritis  ? Bicuspid aortic valve   ? Cataract   ? Coronary artery disease   ? Dysrhythmia   ? atrial flutter  ? Echocardiogram with ECG monitoring 01/12/2010  ? 60-65% EF  ? Hyperlipidemia   ? Hypertension   ? Pre-diabetes   ? ? ?Tobacco History: ?Social History  ? ?Tobacco Use  ?Smoking Status Former  ? Years: 17.00  ? Types: Cigarettes  ? Quit date: 04/24/1978  ? Years since quitting: 43.7  ?Smokeless Tobacco Never  ? ?Counseling given: Not Answered ? ? ?Outpatient Medications Prior to Visit  ?Medication Sig Dispense Refill  ? amLODipine (NORVASC) 5 MG tablet Take 10 mg by mouth.    ? aspirin EC 81 MG tablet Take 81 mg by mouth daily.    ? atorvastatin (LIPITOR) 40 MG tablet TAKE 1 TABLET BY MOUTH AT  BEDTIME 90 tablet 3  ? cetirizine (ZYRTEC) 10 MG tablet Take 10 mg by mouth as needed for allergies.    ? fenofibrate 160 MG tablet TAKE 1 TABLET BY MOUTH  DAILY 90 tablet 3  ? finasteride (PROSCAR) 5 MG tablet TAKE 1 TABLET BY MOUTH  DAILY 90 tablet 3  ? FLUoxetine (PROZAC) 20 MG capsule Take 2 capsules (40 mg  total) by mouth daily. 1 capsule 0  ? fluticasone (FLONASE) 50 MCG/ACT nasal spray Place 2 sprays into both nostrils daily as needed for allergies or rhinitis. 48 g 3  ? metFORMIN (GLUCOPHAGE-XR) 500 MG 24 hr tablet Take 1 tablet (500 mg total) by mouth daily with breakfast. 1 tablet 0  ? metoprolol succinate (TOPROL-XL) 50 MG 24 hr tablet Take 1 tablet (50 mg total) by mouth daily. Take with or immediately following a meal. 90 tablet 1  ? Pirfenidone (ESBRIET) 801 MG TABS Take 801 mg by  mouth with breakfast, with lunch, and with evening meal. 270 tablet 1  ? tamsulosin (FLOMAX) 0.4 MG CAPS capsule Take 1 capsule (0.4 mg total) by mouth daily after supper. (Patient taking differently: Take 0.4 mg by mouth in the morning and at bedtime.) 90 capsule 3  ? ?No facility-administered medications prior to visit.  ? ? ? ?Review of Systems:  ? ?Constitutional: + Weight loss (10 pounds in 1 month).  No night sweats, fevers, chills, fatigue, or lassitude. ?HEENT: No headaches, difficulty swallowing, tooth/dental problems, or sore throat. No sneezing, itching, ear ache, nasal congestion, or post nasal drip ?CV:  No chest pain, orthopnea, PND, swelling in lower extremities, anasarca, dizziness, palpitations, syncope ?Resp: + Minimal cough.  No shortness of breath with exertion or at rest. No excess mucus or change in color of mucus. No hemoptysis. No wheezing.  No chest wall deformity ?GI:  + Anorexia.  No heartburn, indigestion, abdominal pain, nausea, vomiting, diarrhea, change in bowel habits, bloody stools.  ?GU: No dysuria, change in color of urine, urgency or frequency.  No flank pain, no hematuria  ?Skin: No rash, lesions, ulcerations ?MSK:  No joint pain or swelling.  No decreased range of motion.  No back pain. ?Neuro: No dizziness or lightheadedness.  ?Psych: No depression or anxiety. Mood stable.  ? ? ? ?Physical Exam: ? ?BP 116/64 (BP Location: Left Arm, Patient Position: Sitting, Cuff Size: Normal)   Pulse 60   Temp 98 ?F (36.7 ?C) (Oral)   Ht '5\' 7"'$  (1.702 m)   Wt 163 lb 9.6 oz (74.2 kg)   SpO2 95%   BMI 25.62 kg/m?  ? ?GEN: Pleasant, interactive, well-appearing; in no acute distress. ?HEENT:  Normocephalic and atraumatic. PERRLA. Sclera white. Nasal turbinates pink, moist and patent bilaterally. No rhinorrhea present. Oropharynx pink and moist, without exudate or edema. No lesions, ulcerations, or postnasal drip.  ?NECK:  Supple w/ fair ROM. No JVD present. Normal carotid impulses w/o bruits.  Thyroid symmetrical with no goiter or nodules palpated. No lymphadenopathy.   ?CV: RRR, no m/r/g, no peripheral edema. Pulses intact, +2 bilaterally. No cyanosis, pallor or clubbing. ?PULMONARY:  Unlabored, regular breathing.  Minimal bibasilar crackles; otherwise clear bilaterally A&P w/o wheezes/rales/rhonchi. No accessory muscle use. No dullness to percussion. ?GI: BS present and normoactive. Soft, non-tender to palpation. No organomegaly or masses detected. No CVA tenderness. ?MSK: No erythema, warmth or tenderness. Cap refil <2 sec all extrem. No deformities or joint swelling noted.  ?Neuro: A/Ox3. No focal deficits noted.   ?Skin: Warm, no lesions or rashe ?Psych: Normal affect and behavior. Judgement and thought content appropriate.  ? ? ? ?Lab Results: ? ?CBC ?   ?Component Value Date/Time  ? WBC 8.7 12/25/2020 1253  ? WBC 18.7 (H) 03/02/2017 0509  ? RBC 5.51 12/25/2020 1253  ? RBC 3.62 (L) 03/02/2017 0509  ? HGB 16.3 12/25/2020 1253  ? HCT 48.7 12/25/2020 1253  ? PLT 243  12/25/2020 1253  ? MCV 88 12/25/2020 1253  ? MCH 29.6 12/25/2020 1253  ? MCH 29.6 03/02/2017 0509  ? MCHC 33.5 12/25/2020 1253  ? MCHC 32.8 03/02/2017 0509  ? RDW 13.0 12/25/2020 1253  ? LYMPHSABS 2.5 12/25/2020 1253  ? MONOABS 0.8 02/18/2017 1200  ? EOSABS 0.3 12/25/2020 1253  ? BASOSABS 0.1 12/25/2020 1253  ? ? ?BMET ?   ?Component Value Date/Time  ? NA 138 01/04/2022 1516  ? NA 140 12/25/2020 1253  ? K 4.4 01/04/2022 1516  ? CL 101 01/04/2022 1516  ? CO2 29 01/04/2022 1516  ? GLUCOSE 102 (H) 01/04/2022 1516  ? BUN 24 (H) 01/04/2022 1516  ? BUN 14 12/25/2020 1253  ? CREATININE 0.96 01/04/2022 1516  ? CREATININE 1.22 (H) 04/26/2019 1021  ? CALCIUM 10.1 01/04/2022 1516  ? GFRNONAA 67 09/09/2020 1418  ? GFRNONAA 58 (L) 04/26/2019 1021  ? GFRAA 77 09/09/2020 1418  ? GFRAA 67 04/26/2019 1021  ? ? ?BNP ?No results found for: BNP ? ? ?Imaging: ? ?No results found. ? ? ? ? ?  Latest Ref Rng & Units 06/17/2021  ? 10:59 AM 08/24/2017  ? 10:59 AM   ?PFT Results  ?FVC-Pre L 1.48   2.05  P  ?FVC-Predicted Pre % 39   53  P  ?FVC-Post L 1.55   2.02  P  ?FVC-Predicted Post % 41   52  P  ?Pre FEV1/FVC % % 93   92  P  ?Post FEV1/FCV % % 95   94  P  ?FEV1-Pre L 1.38   1.

## 2022-01-19 ENCOUNTER — Ambulatory Visit: Payer: Medicare Other | Admitting: Nurse Practitioner

## 2022-01-19 ENCOUNTER — Encounter: Payer: Self-pay | Admitting: Nurse Practitioner

## 2022-01-19 ENCOUNTER — Telehealth: Payer: Self-pay | Admitting: Pharmacist

## 2022-01-19 DIAGNOSIS — R634 Abnormal weight loss: Secondary | ICD-10-CM

## 2022-01-19 DIAGNOSIS — J84112 Idiopathic pulmonary fibrosis: Secondary | ICD-10-CM

## 2022-01-19 MED ORDER — PIRFENIDONE 267 MG PO TABS: 534.0000 mg | ORAL_TABLET | Freq: Three times a day (TID) | ORAL | 4 refills | Status: DC

## 2022-01-19 MED ORDER — PIRFENIDONE 267 MG PO TABS: ORAL_TABLET | ORAL | 0 refills | Status: DC

## 2022-01-19 NOTE — Assessment & Plan Note (Signed)
Improving after Esbriet holiday.  Advised to continue with small, frequent high-protein meals.  Can use Ensure supplements 2-3 times a day as well.  Advised to notify if symptoms return after starting back on Esbriet therapy. ?

## 2022-01-19 NOTE — Assessment & Plan Note (Signed)
Had weight loss and anorexia with full dose Esbriet.  After a 2-week holiday, feels significantly better and has been able to put some weight back on.  Does want to go back on Esbriet but does not want to move forward with full dose again.  Will start with 1 pill 3 times daily for a week and if tolerated increase to 2 pills 3 times daily and remain on this.  Recheck CMET 4 weeks after starting back on therapy.  Message sent to pharmacy team to get this set up as he only has 801 mg tabs at home. ? ?Patient Instructions  ?Restart Esbriet 1 pill Three times a day for 1 week and then increase to 2 pills Three times a day. Take with food. Wear sunscreen when outside  ?  ?Frequent small meals/snacks that are high protein. Can also add Ensure drinks 2-3 times a day.  ?  ?Follow up in 6 weeks with Dr. Vaughan Browner or Alanson Aly. If symptoms do not improve or worsen, please contact office for sooner follow up or seek emergency care. ? ? ?

## 2022-01-19 NOTE — Patient Instructions (Addendum)
Restart Esbriet 1 pill Three times a day for 1 week and then increase to 2 pills Three times a day. Take with food. Wear sunscreen when outside  ?  ?Frequent small meals/snacks that are high protein. Can also add Ensure drinks 2-3 times a day.  ?  ?Follow up in 6 weeks with Dr. Vaughan Browner or Alanson Aly. If symptoms do not improve or worsen, please contact office for sooner follow up or seek emergency care. ?

## 2022-01-19 NOTE — Progress Notes (Signed)
? ?'@Patient'$  ID: Todd Parsons, male    DOB: [birth year], 76 y.o.   MRN: [id number] ? ?Chief Complaint  ?Patient presents with  ? Follow-up  ?  Follow up. Patient has no complaints.   ? ? ?Referring provider: ?Hali Marry, * ? ?HPI: ?76 year old male, former smoker followed for pulmonary fibrosis.  He is a patient Dr. Matilde Bash and last seen in office on 01/04/2022 by [facility] NP.  Past medical history significant for a flutter, aortic valve replacement, aortic aneurysm repair, HLD, diabetes.  ? ?TEST/EVENTS:  ?07/22/2017 HRCT chest: pulmonary fibrosis, probable UIP ?08/24/2017 PFTs: FVC 50%, FEV1 69%, ratio 94, TLC 56%, DLCO 50%.  Severe restriction and diffusion defect ?08/24/2017: ANA, CCP, hypersensitivity panel negative ?09/09/2020 HRCT chest: Slight worsening of pulmonary fibrosis and probable UIP pattern ? ?11/23/2021: OV with Dr. Vaughan Browner.  On full dose Esbriet.  Tolerating well without any issues.  Has been referred to pulmonary rehab and currently on the wait list.  Has a mild cough and associated postnasal drip.  Denies any DOE.  CMET in stable liver function ? ?01/04/2022: OV with Yaniah Thiemann NP for acute visit.  Reported having weight loss and anorexia over the last month.  Lost around 9 to 10 pounds.  Feels like he just does not have much of an appetite.  Has an aversion to meat especially.  Denies any nausea, diarrhea, hemoptysis, recent fevers, night sweats.  Breathing overall stable and cough unchanged.  Uses Zyrtec and Flonase for trigger prevention and postnasal drainage which she feels like work well.  Advised that we take a Esbriet holiday for 2 weeks.  Check CMET; which was normal.  ? ?01/19/2022: Today-follow-up ?Patient presents today with wife for follow up. He reports that he has felt significantly better. Appetite has returned and he has been able to eat meats again.  Had ribs recently without any difficulties.  They recently went on vacation as well and he felt he was actually able to enjoy himself and  eat anything he wanted to.  He has gained back 4 pounds.  Breathing overall stable and cough unchanged.  He does want to go back on Esbriet but does not want to go back on full dose as he was on previously.  Feels like his quality of life was significantly decreased with the side effects of this.  He denies any recent fevers, hemoptysis, lower extremity swelling, night sweats.  No GI concerns or complaints today.  He continues on Flonase and Zyrtec for postnasal drainage and trigger prevention. ? ?No Known Allergies ? ?Immunization History  ?Administered Date(s) Administered  ? Fluad Quad(high Dose 65+) 05/23/2019, 06/20/2020, 07/07/2021  ? Influenza Split 05/19/2012  ? Influenza Whole 05/05/2011  ? Influenza, High Dose Seasonal PF 05/30/2017, 06/13/2018  ? Influenza,inj,Quad PF,6+ Mos 07/27/2013, 07/08/2014, 06/21/2015, 06/02/2016  ? Influenza-Unspecified 07/06/2015  ? PFIZER(Purple Top)SARS-COV-2 Vaccination 11/04/2019, 11/28/2019, 07/27/2020, 12/09/2020  ? Pneumococcal Conjugate-13 07/24/2014  ? Pneumococcal Polysaccharide-23 07/12/2010  ? Tdap 08/19/2011  ? Zoster, Live 06/11/2008  ? ? ?Past Medical History:  ?Diagnosis Date  ? Arthritis   ? osteoarthritis  ? Bicuspid aortic valve   ? Cataract   ? Coronary artery disease   ? Dysrhythmia   ? atrial flutter  ? Echocardiogram with ECG monitoring 01/12/2010  ? 60-65% EF  ? Hyperlipidemia   ? Hypertension   ? Pre-diabetes   ? ? ?Tobacco History: ?Social History  ? ?Tobacco Use  ?Smoking Status Former  ? Years: 17.00  ? Types: Cigarettes  ?  Quit date: 04/24/1978  ? Years since quitting: 43.7  ?Smokeless Tobacco Never  ? ?Counseling given: Not Answered ? ? ?Outpatient Medications Prior to Visit  ?Medication Sig Dispense Refill  ? amLODipine (NORVASC) 5 MG tablet Take 10 mg by mouth.    ? aspirin EC 81 MG tablet Take 81 mg by mouth daily.    ? atorvastatin (LIPITOR) 40 MG tablet TAKE 1 TABLET BY MOUTH AT  BEDTIME 90 tablet 3  ? cetirizine (ZYRTEC) 10 MG tablet Take 10 mg  by mouth as needed for allergies.    ? fenofibrate 160 MG tablet TAKE 1 TABLET BY MOUTH  DAILY 90 tablet 3  ? finasteride (PROSCAR) 5 MG tablet TAKE 1 TABLET BY MOUTH  DAILY 90 tablet 3  ? FLUoxetine (PROZAC) 20 MG capsule Take 2 capsules (40 mg total) by mouth daily. 1 capsule 0  ? fluticasone (FLONASE) 50 MCG/ACT nasal spray Place 2 sprays into both nostrils daily as needed for allergies or rhinitis. 48 g 3  ? metFORMIN (GLUCOPHAGE-XR) 500 MG 24 hr tablet Take 1 tablet (500 mg total) by mouth daily with breakfast. 1 tablet 0  ? metoprolol succinate (TOPROL-XL) 50 MG 24 hr tablet Take 1 tablet (50 mg total) by mouth daily. Take with or immediately following a meal. 90 tablet 1  ? Pirfenidone (ESBRIET) 801 MG TABS Take 801 mg by mouth with breakfast, with lunch, and with evening meal. 270 tablet 1  ? tamsulosin (FLOMAX) 0.4 MG CAPS capsule Take 1 capsule (0.4 mg total) by mouth daily after supper. (Patient taking differently: Take 0.4 mg by mouth in the morning and at bedtime.) 90 capsule 3  ? ?No facility-administered medications prior to visit.  ? ? ? ?Review of Systems:  ? ?Constitutional: No weight loss or gain, night sweats, fevers, chills, fatigue, or lassitude. + Improving appetite and gaining weight back ?HEENT: No headaches, difficulty swallowing, tooth/dental problems, or sore throat. No sneezing, itching, ear ache, nasal congestion, or post nasal drip ?CV:  No chest pain, orthopnea, PND, swelling in lower extremities, anasarca, dizziness, palpitations, syncope ?Resp: + Minimal chronic cough.  No shortness of breath with exertion or at rest. No excess mucus or change in color of mucus. No productive or non-productive. No hemoptysis. No wheezing.  No chest wall deformity ?GI:  No heartburn, indigestion, abdominal pain, nausea, vomiting, diarrhea, change in bowel habits, loss of appetite, bloody stools.  ?GU: No dysuria, change in color of urine, urgency or frequency.  No flank pain, no hematuria  ?Skin: No  rash, lesions, ulcerations ?MSK:  No joint pain or swelling.  No decreased range of motion.  No back pain. ?Neuro: No dizziness or lightheadedness.  ?Psych: No depression or anxiety. Mood stable.  ? ? ? ?Physical Exam: ? ?BP 112/68 (BP Location: Right Arm, Patient Position: Sitting, Cuff Size: Normal)   Pulse (!) 58   Temp 97.9 ?F (36.6 ?C) (Oral)   Ht '5\' 7"'$  (1.702 m)   Wt 165 lb 3.2 oz (74.9 kg)   SpO2 94%   BMI 25.87 kg/m?  ? ?GEN: Pleasant, interactive, well-appearing; in no acute distress. ?HEENT:  Normocephalic and atraumatic. PERRLA. Sclera white. Nasal turbinates pink, moist and patent bilaterally. No rhinorrhea present. Oropharynx pink and moist, without exudate or edema. No lesions, ulcerations, or postnasal drip.  ?NECK:  Supple w/ fair ROM. No JVD present. Normal carotid impulses w/o bruits. Thyroid symmetrical with no goiter or nodules palpated. No lymphadenopathy.   ?CV: RRR, no m/r/g, no peripheral  edema. Pulses intact, +2 bilaterally. No cyanosis, pallor or clubbing. ?PULMONARY:  Unlabored, regular breathing.  Minimal bibasilar crackles; otherwise clear bilaterally A&P w/o wheezes/rales/rhonchi. No accessory muscle use. No dullness to percussion. ?GI: BS present and normoactive. Soft, non-tender to palpation. No organomegaly or masses detected. No CVA tenderness. ?MSK: No erythema, warmth or tenderness. Cap refil <2 sec all extrem. No deformities or joint swelling noted.  ?Neuro: A/Ox3. No focal deficits noted.   ?Skin: Warm, no lesions or rashe ?Psych: Normal affect and behavior. Judgement and thought content appropriate.  ? ? ? ?Lab Results: ? ?CBC ?   ?Component Value Date/Time  ? WBC 8.7 12/25/2020 1253  ? WBC 18.7 (H) 03/02/2017 0509  ? RBC 5.51 12/25/2020 1253  ? RBC 3.62 (L) 03/02/2017 0509  ? HGB 16.3 12/25/2020 1253  ? HCT 48.7 12/25/2020 1253  ? PLT 243 12/25/2020 1253  ? MCV 88 12/25/2020 1253  ? MCH 29.6 12/25/2020 1253  ? MCH 29.6 03/02/2017 0509  ? MCHC 33.5 12/25/2020 1253  ? MCHC  32.8 03/02/2017 0509  ? RDW 13.0 12/25/2020 1253  ? LYMPHSABS 2.5 12/25/2020 1253  ? MONOABS 0.8 02/18/2017 1200  ? EOSABS 0.3 12/25/2020 1253  ? BASOSABS 0.1 12/25/2020 1253  ? ? ?BMET ?   ?Component Val

## 2022-01-19 NOTE — Telephone Encounter (Signed)
Received notification from [facility], NP's, [place] stating patient will be retitrating Esbriet. ? ?He currently has the '801mg'$  tablets therefore will need new rx for '267mg'$  tabs. ? ?Dose: 267 mg (1 tab) three times daily  7 days, then increase to '534mg'$  three times daily as maintenance. Will stay on low-dose. ? ?Knox Saliva, PharmD, MPH, BCPS, CPP ?Clinical Pharmacist (Rheumatology and Pulmonology) ?

## 2022-01-26 ENCOUNTER — Telehealth: Payer: Medicare Other

## 2022-02-19 ENCOUNTER — Telehealth: Payer: Medicare Other

## 2022-02-19 ENCOUNTER — Telehealth: Payer: Self-pay | Admitting: Pharmacist

## 2022-02-19 NOTE — Telephone Encounter (Signed)
Unsuccessful attempt x2 to contact patient for follow-up phone call for CCM services with pharmacist.  Will route to schedule team for reschedule.  Macarius Ruark, PharmD Clinical Pharmacist [facility] [phone]   

## 2022-02-19 NOTE — Progress Notes (Incomplete)
Chronic Care Management Pharmacy Note  02/19/2022 Name:  Todd Parsons MRN:  [id number] DOB:  11/15/1945  Summary: addressed DM, HTN, HLD. Patient states his cardiologist retired and might need new referral for cardiologist, he is unsure. Starting w/pulmonary rehab soon and looking forward to it.  Recommendations/Changes made from today's visit: Recommend consider adjustment of metoprolol tartrate to succinate in order for better duration of activity to support a once daily dosing strategy. --> He recently transitioned all RX to the [place] so because metoprolol was sent to mail order, he cancelled it/did not implement this change from last time. Patient is agreeable to implementing this change, requesting RX to the [place] pharmacy.  Plan: f/u with pharmacist in 6 months  Subjective: Todd Parsons is an 76 y.o. year old male who is a primary patient of Metheney, Rene Kocher, Parsons.  The CCM team was consulted for assistance with disease management and care coordination needs.    Engaged with patient by telephone for follow up visit in response to provider referral for pharmacy case management and/or care coordination services.   Consent to Services:  The patient was given information about Chronic Care Management services, agreed to services, and gave verbal consent prior to initiation of services.  Please see initial visit note for detailed documentation.   Patient Care Team: Todd Parsons as PCP - General (Family Medicine) Todd Parents, Parsons as Attending Physician (Cardiology) Todd Parsons, [facility] as Pharmacist (Pharmacist)  Objective:  Lab Results  Component Value Date   CREATININE 0.96 01/04/2022   CREATININE 0.99 11/23/2021   CREATININE 1.06 08/21/2021    Lab Results  Component Value Date   HGBA1C 5.7 (A) 11/23/2021       Component Value Date/Time   CHOL 118 12/31/2019 1101   TRIG 126 12/31/2019 1101   HDL 34 (L) 12/31/2019 1101   CHOLHDL 5.4 (H) 10/16/2018  1159   VLDL 29 04/05/2016 0833   LDLCALC 61 12/31/2019 1101   LDLCALC 65 10/16/2018 1159       Latest Ref Rng & Units 01/04/2022    3:16 PM 11/23/2021   10:23 AM 08/21/2021   10:27 AM  Hepatic Function  Total Protein 6.0 - 8.3 g/dL 7.3  6.6  6.9   Albumin 3.5 - 5.2 g/dL 4.4  4.1  4.0   AST 0 - 37 U/L 25  21  24    ALT 0 - 53 U/L 17  14  20    Alk Phosphatase 39 - 117 U/L 52  64  53   Total Bilirubin 0.2 - 1.2 mg/dL 0.6  0.4  0.6     Lab Results  Component Value Date/Time   TSH 1.470 09/09/2020 02:18 PM   TSH 3.31 04/05/2016 08:33 AM       Latest Ref Rng & Units 12/25/2020   12:53 PM 09/30/2017    1:08 PM 03/02/2017    5:09 AM  CBC  WBC 3.4 - 10.8 x10E3/uL 8.7  7.9  18.7   Hemoglobin 13.0 - 17.7 g/dL 16.3  14.8  10.7   Hematocrit 37.5 - 51.0 % 48.7  42.7  32.6   Platelets 150 - 450 x10E3/uL 243  225  150     Social History   Tobacco Use  Smoking Status Former   Years: 17.00   Types: Cigarettes   Quit date: 04/24/1978   Years since quitting: 43.8  Smokeless Tobacco Never   BP Readings from Last 3 Encounters:  01/19/22 112/68  01/04/22 116/64  11/23/21 133/85   Pulse Readings from Last 3 Encounters:  01/19/22 (!) 58  01/04/22 60  11/23/21 61   Wt Readings from Last 3 Encounters:  01/19/22 165 lb 3.2 oz (74.9 kg)  01/04/22 163 lb 9.6 oz (74.2 kg)  11/24/21 170 lb 13.7 oz (77.5 kg)    Assessment: Review of patient past medical history, allergies, medications, health status, including review of consultants reports, laboratory and other test data, was performed as part of comprehensive evaluation and provision of chronic care management services.   SDOH:  (Social Determinants of Health) assessments and interventions performed:    CCM Care Plan  No Known Allergies  Medications Reviewed Today     Reviewed by Todd Bibles, NP (Nurse Practitioner) on 01/19/22 at 1248  Med List Status: <None>   Medication Order Taking? Sig Documenting Provider Last Dose  Status Informant  amLODipine (NORVASC) 5 MG tablet [id number] Yes Take 10 mg by mouth. Provider, Historical, Parsons Taking Active            Med Note Todd Parsons, Vilinda Blanks   Fri Aug 21, 2021 10:00 AM)    aspirin EC 81 MG tablet [id number] Yes Take 81 mg by mouth daily. Provider, Historical, Parsons Taking Active Self  atorvastatin (LIPITOR) 40 MG tablet [id number] Yes TAKE 1 TABLET BY MOUTH AT  BEDTIME Todd Parsons Taking Active   cetirizine (ZYRTEC) 10 MG tablet [id number] Yes Take 10 mg by mouth as needed for allergies. Provider, Historical, Parsons Taking Active   fenofibrate 160 MG tablet [id number] Yes TAKE 1 TABLET BY MOUTH  DAILY Todd Parsons Taking Active   finasteride (PROSCAR) 5 MG tablet [id number] Yes TAKE 1 TABLET BY MOUTH  DAILY Todd Parsons Taking Active   FLUoxetine (PROZAC) 20 MG capsule [id number] Yes Take 2 capsules (40 mg total) by mouth daily. Todd Parsons Taking Active   fluticasone Asencion Islam) 50 MCG/ACT nasal spray [id number] Yes Place 2 sprays into both nostrils daily as needed for allergies or rhinitis. Todd Parsons Taking Active   metFORMIN (GLUCOPHAGE-XR) 500 MG 24 hr tablet [id number] Yes Take 1 tablet (500 mg total) by mouth daily with breakfast. Todd Parsons Taking Active   metoprolol succinate (TOPROL-XL) 50 MG 24 hr tablet [id number] Yes Take 1 tablet (50 mg total) by mouth daily. Take with or immediately following a meal. Todd Parsons Taking Active            Med Note Dewitt Rota Sep 21, 2021 10:46 AM) Metoprolol tartrate  Pirfenidone (ESBRIET) 801 MG TABS [id number] Yes Take 801 mg by mouth with breakfast, with lunch, and with evening meal. Mannam, Praveen, Parsons Taking Active   tamsulosin (FLOMAX) 0.4 MG CAPS capsule [id number] Yes Take 1 capsule (0.4 mg total) by mouth daily after supper.  Patient taking differently: Take 0.4 mg by mouth in the morning and at bedtime.   Todd Parsons  Taking Active             Patient Active Problem List   Diagnosis Date Noted   Memory changes 11/23/2021   Weight loss 08/26/2020   Unspecified mood (affective) disorder ([place]) 08/26/2020   Benign prostatic hyperplasia (BPH) with urinary urgency 04/25/2020   Word finding difficulty 04/25/2020   Seborrheic keratoses 09/10/2019   Acute pain of right shoulder 09/10/2019   CAD (coronary artery disease) 04/15/2017   Wears hearing aid 06/02/2016  OAB (overactive bladder) 04/02/2016   Primary osteoarthritis of both knees 02/10/2016   IPF (idiopathic pulmonary fibrosis) ([place]) 12/22/2015   H/O aortic valve replacement 09/15/2015   History of atrial flutter 09/15/2015   Type 2 diabetes mellitus with cardiac complication ([place]) 28/36/6294   Restrictive airway disease 08/09/2014   Hx of arthroscopy of knee 06/08/2011   Essential hypertension, benign 05/05/2011   Hyperlipidemia LDL goal <100 05/05/2011   OSA (obstructive sleep apnea) 05/05/2011   Thoracic aortic aneurysm without rupture ([place]) 05/05/2011   Congenital insufficiency of aortic valve 05/05/2011   History of bicuspid aortic valve 05/05/2011   Vitamin D deficiency 05/05/2011    Immunization History  Administered Date(s) Administered   Fluad Quad(high Dose 65+) 05/23/2019, 06/20/2020, 07/07/2021   Influenza Split 05/19/2012   Influenza Whole 05/05/2011   Influenza, High Dose Seasonal PF 05/30/2017, 06/13/2018   Influenza,inj,Quad PF,6+ Mos 07/27/2013, 07/08/2014, 06/21/2015, 06/02/2016   Influenza-Unspecified 07/06/2015   PFIZER(Purple Top)SARS-COV-2 Vaccination 11/04/2019, 11/28/2019, 07/27/2020, 12/09/2020   Pneumococcal Conjugate-13 07/24/2014   Pneumococcal Polysaccharide-23 07/12/2010   Tdap 08/19/2011   Zoster, Live 06/11/2008    Conditions to be addressed/monitored: HTN, HLD, and DMII  There are no care plans that you recently modified to display for this patient.     Medication Assistance: None required.   Patient affirms current coverage meets needs.  Patient's preferred pharmacy is:  [facility] [address] [place] [address] Phone: [phone] Fax: [phone]  [facility] ([place], [place] [facility] [address] [place] [address] Phone: [phone] Fax: [place], [place] - [place] [address] [place] [address] Phone: [phone] Fax: [phone]  [facility] - [place], [address] [place] [address] Phone: [phone] Fax: [phone]   Uses pill box? Yes Pt endorses 100% compliance  Follow Up:  Patient agrees to Care Plan and Follow-up.  Plan: Telephone follow up appointment with care management team member scheduled for:  6 months  Todd Parsons

## 2022-03-02 ENCOUNTER — Ambulatory Visit: Payer: Medicare Other | Admitting: Nurse Practitioner

## 2022-03-02 ENCOUNTER — Encounter: Payer: Self-pay | Admitting: Nurse Practitioner

## 2022-03-02 VITALS — BP 106/70 | HR 66 | Temp 97.9°F | Ht 67.0 in | Wt 167.6 lb

## 2022-03-02 DIAGNOSIS — J84112 Idiopathic pulmonary fibrosis: Secondary | ICD-10-CM | POA: Diagnosis not present

## 2022-03-04 ENCOUNTER — Other Ambulatory Visit (INDEPENDENT_AMBULATORY_CARE_PROVIDER_SITE_OTHER): Payer: Medicare Other

## 2022-03-04 DIAGNOSIS — Z5181 Encounter for therapeutic drug level monitoring: Secondary | ICD-10-CM

## 2022-03-04 LAB — COMPREHENSIVE METABOLIC PANEL
ALT: 15 U/L (ref 0–53)
AST: 21 U/L (ref 0–37)
Albumin: 4.2 g/dL (ref 3.5–5.2)
Alkaline Phosphatase: 51 U/L (ref 39–117)
BUN: 22 mg/dL (ref 6–23)
CO2: 27 mEq/L (ref 19–32)
Calcium: 9.6 mg/dL (ref 8.4–10.5)
Chloride: 102 mEq/L (ref 96–112)
Creatinine, Ser: 1.1 mg/dL (ref 0.40–1.50)
GFR: 65.01 mL/min (ref >60.00)
Glucose, Bld: 132 mg/dL — ABNORMAL HIGH (ref 70–99)
Potassium: 3.8 mEq/L (ref 3.5–5.1)
Sodium: 136 mEq/L (ref 135–145)
Total Bilirubin: 0.7 mg/dL (ref 0.2–1.2)
Total Protein: 6.8 g/dL (ref 6.0–8.3)

## 2022-03-05 ENCOUNTER — Telehealth: Payer: Medicare Other

## 2022-03-08 ENCOUNTER — Ambulatory Visit (INDEPENDENT_AMBULATORY_CARE_PROVIDER_SITE_OTHER): Payer: Medicare Other | Admitting: Pharmacist

## 2022-03-08 DIAGNOSIS — E785 Hyperlipidemia, unspecified: Secondary | ICD-10-CM

## 2022-03-08 DIAGNOSIS — I10 Essential (primary) hypertension: Secondary | ICD-10-CM

## 2022-03-08 DIAGNOSIS — E1159 Type 2 diabetes mellitus with other circulatory complications: Secondary | ICD-10-CM

## 2022-03-08 NOTE — Patient Instructions (Signed)
Visit Information  Thank you for taking time to visit with me today. Please don't hesitate to contact me if I can be of assistance to you before our next scheduled telephone appointment.  Following are the goals we discussed today:  Patient Goals/Self-Care Activities Over the next 180 days, patient will:  take medications as prescribed  Follow Up Plan: Telephone follow up appointment with care management team member scheduled for: 6 months  Please call the care guide team at [phone] if you need to cancel or reschedule your appointment.   Patient verbalizes understanding of instructions and care plan provided today and agrees to view in MyChart. Active MyChart status and patient understanding of how to access instructions and care plan via MyChart confirmed with patient.     Todd Parsons J Darcella Shiffman  

## 2022-03-08 NOTE — Progress Notes (Signed)
Chronic Care Management Pharmacy Note  03/08/2022 Name:  Todd Parsons MRN:  [id number] DOB:  08/04/1946  Summary: addressed DM, HTN, HLD. Patient is working with pulmonology for the following: maintenance dosing of Esbriet, pending oxygen setup, imaging, an overnight study as well as updated PFTs. He is doing well with medications and chronic conditions.  Recommendations/Changes made from today's visit: no changes, pt is doing well  Plan: f/u with pharmacist in 6 months  Subjective: Todd Parsons is an 76 y.o. year old male who is a primary patient of Metheney, Rene Kocher, MD.  The CCM team was consulted for assistance with disease management and care coordination needs.    Engaged with patient by telephone for follow up visit in response to provider referral for pharmacy case management and/or care coordination services.   Consent to Services:  The patient was given information about Chronic Care Management services, agreed to services, and gave verbal consent prior to initiation of services.  Please see initial visit note for detailed documentation.   Patient Care Team: Hali Marry, MD as PCP - General (Family Medicine) Ellis Parents, MD as Attending Physician (Cardiology) Darius Bump, [facility] as Pharmacist (Pharmacist)  Objective:  Lab Results  Component Value Date   CREATININE 1.10 03/04/2022   CREATININE 0.96 01/04/2022   CREATININE 0.99 11/23/2021    Lab Results  Component Value Date   HGBA1C 5.7 (A) 11/23/2021       Component Value Date/Time   CHOL 118 12/31/2019 1101   TRIG 126 12/31/2019 1101   HDL 34 (L) 12/31/2019 1101   CHOLHDL 5.4 (H) 10/16/2018 1159   VLDL 29 04/05/2016 0833   LDLCALC 61 12/31/2019 1101   LDLCALC 65 10/16/2018 1159       Latest Ref Rng & Units 03/04/2022    2:36 PM 01/04/2022    3:16 PM 11/23/2021   10:23 AM  Hepatic Function  Total Protein 6.0 - 8.3 g/dL 6.8  7.3  6.6   Albumin 3.5 - 5.2 g/dL 4.2  4.4  4.1   AST 0 - 37  U/L 21  25  21    ALT 0 - 53 U/L 15  17  14    Alk Phosphatase 39 - 117 U/L 51  52  64   Total Bilirubin 0.2 - 1.2 mg/dL 0.7  0.6  0.4     Lab Results  Component Value Date/Time   TSH 1.470 09/09/2020 02:18 PM   TSH 3.31 04/05/2016 08:33 AM       Latest Ref Rng & Units 12/25/2020   12:53 PM 09/30/2017    1:08 PM 03/02/2017    5:09 AM  CBC  WBC 3.4 - 10.8 x10E3/uL 8.7  7.9  18.7   Hemoglobin 13.0 - 17.7 g/dL 16.3  14.8  10.7   Hematocrit 37.5 - 51.0 % 48.7  42.7  32.6   Platelets 150 - 450 x10E3/uL 243  225  150     Social History   Tobacco Use  Smoking Status Former   Years: 17.00   Types: Cigarettes   Quit date: 04/24/1978   Years since quitting: 43.9  Smokeless Tobacco Never   BP Readings from Last 3 Encounters:  03/02/22 106/70  01/19/22 112/68  01/04/22 116/64   Pulse Readings from Last 3 Encounters:  03/02/22 66  01/19/22 (!) 58  01/04/22 60   Wt Readings from Last 3 Encounters:  03/02/22 167 lb 9.6 oz (76 kg)  01/19/22 165 lb 3.2 oz (74.9  kg)  01/04/22 163 lb 9.6 oz (74.2 kg)    Assessment: Review of patient past medical history, allergies, medications, health status, including review of consultants reports, laboratory and other test data, was performed as part of comprehensive evaluation and provision of chronic care management services.   SDOH:  (Social Determinants of Health) assessments and interventions performed:    CCM Care Plan  No Known Allergies  Medications Reviewed Today     Reviewed by Darius Bump, [facility] (Pharmacist) on 03/08/22 at 1142  Med List Status: <None>   Medication Order Taking? Sig Documenting Provider Last Dose Status Informant  amLODipine (NORVASC) 10 MG tablet [id number] Yes Take 10 mg by mouth daily. [provider] Taking Active            Med Note [name], Vilinda Blanks   Fri Aug 21, 2021 10:00 AM)    aspirin EC 81 MG tablet [id number] Yes Take 81 mg by mouth daily. [provider] Taking Active Self   atorvastatin (LIPITOR) 40 MG tablet [id number] Yes TAKE 1 TABLET BY MOUTH AT  BEDTIME Hali Marry, MD Taking Active   cetirizine (ZYRTEC) 10 MG tablet [id number] Yes Take 10 mg by mouth as needed for allergies. [provider] Taking Active   fenofibrate 160 MG tablet [id number] Yes TAKE 1 TABLET BY MOUTH  DAILY Hali Marry, MD Taking Active   finasteride (PROSCAR) 5 MG tablet [id number] Yes TAKE 1 TABLET BY MOUTH  DAILY Hali Marry, MD Taking Active   FLUoxetine (PROZAC) 20 MG capsule [id number] Yes Take 2 capsules (40 mg total) by mouth daily. Hali Marry, MD Taking Active   fluticasone Asencion Islam) 50 MCG/ACT nasal spray [id number] Yes Place 2 sprays into both nostrils daily as needed for allergies or rhinitis. Hali Marry, MD Taking Active   metFORMIN (GLUCOPHAGE-XR) 500 MG 24 hr tablet [id number] Yes Take 1 tablet (500 mg total) by mouth daily with breakfast. Hali Marry, MD Taking Active   metoprolol succinate (TOPROL-XL) 50 MG 24 hr tablet [id number] Yes Take 1 tablet (50 mg total) by mouth daily. Take with or immediately following a meal. Hali Marry, MD Taking Active            Med Note Dewitt Rota Sep 21, 2021 10:46 AM) Metoprolol tartrate  Pirfenidone (ESBRIET) 267 MG TABS [id number]  Take 1 tablet by mouth three times daily x 7 days, then increase to 2 tab three times daily thereafter. **NOTE low dose as maintenance** Gajera, Devki S, RPH-CPP  Active   Pirfenidone (ESBRIET) 267 MG TABS [id number] Yes Take 2 tablets (534 mg total) by mouth with breakfast, with lunch, and with evening meal. **low dose as maintenance** Gajera, Devki S, RPH-CPP Taking Active   tamsulosin (FLOMAX) 0.4 MG CAPS capsule [id number] Yes Take 1 capsule (0.4 mg total) by mouth daily after supper.  Patient taking differently: Take 0.4 mg by mouth in the morning and at bedtime.   Hali Marry, MD Taking Active              Patient Active Problem List   Diagnosis Date Noted   Memory changes 11/23/2021   Weight loss 08/26/2020   Unspecified mood (affective) disorder ([place]) 08/26/2020   Benign prostatic hyperplasia (BPH) with urinary urgency 04/25/2020   Word finding difficulty 04/25/2020   Seborrheic keratoses 09/10/2019   Acute pain of right shoulder 09/10/2019   CAD (coronary artery disease) 04/15/2017   Wears hearing aid 06/02/2016  OAB (overactive bladder) 04/02/2016   Primary osteoarthritis of both knees 02/10/2016   IPF (idiopathic pulmonary fibrosis) ([place]) 12/22/2015   H/O aortic valve replacement 09/15/2015   History of atrial flutter 09/15/2015   Type 2 diabetes mellitus with cardiac complication ([place]) 18/84/1660   Restrictive airway disease 08/09/2014   Hx of arthroscopy of knee 06/08/2011   Essential hypertension, benign 05/05/2011   Hyperlipidemia LDL goal <100 05/05/2011   OSA (obstructive sleep apnea) 05/05/2011   Thoracic aortic aneurysm without rupture ([place]) 05/05/2011   Congenital insufficiency of aortic valve 05/05/2011   History of bicuspid aortic valve 05/05/2011   Vitamin D deficiency 05/05/2011    Immunization History  Administered Date(s) Administered   Fluad Quad(high Dose 65+) 05/23/2019, 06/20/2020, 07/07/2021   Influenza Split 05/19/2012   Influenza Whole 05/05/2011   Influenza, High Dose Seasonal PF 05/30/2017, 06/13/2018   Influenza,inj,Quad PF,6+ Mos 07/27/2013, 07/08/2014, 06/21/2015, 06/02/2016   Influenza-Unspecified 05/19/2012, 07/06/2015   PFIZER(Purple Top)SARS-COV-2 Vaccination 11/04/2019, 11/28/2019, 07/27/2020, 12/09/2020   Pneumococcal Conjugate-13 07/24/2014   Pneumococcal Polysaccharide-23 07/12/2010   Tdap 08/19/2011   Zoster, Live 06/11/2008    Conditions to be addressed/monitored: HTN, HLD, and DMII  Care Plan : Medication Management  Updates made by Darius Bump, [place] since 03/08/2022 12:00 AM     Problem: HTN, HLD, DM       Long-Range Goal: Disease Progression Prevention   Start Date: 03/10/2021  Recent Progress: On track  Priority: High  Note:   Current Barriers:  None at present  Pharmacist Clinical Goal(s):  Over the next 1800 days, patient will maintain control of chronic conditions as evidenced by medication fill history, lab values, and vital signs  through collaboration with PharmD and provider.   Interventions: 1:1 collaboration with Hali Marry, MD regarding development and update of comprehensive plan of care as evidenced by provider attestation and co-signature Inter-disciplinary care team collaboration (see longitudinal plan of care) Comprehensive medication review performed; medication list updated in electronic medical record  Diabetes:  Controlled; current treatment:metformin 582m daily; a1c 6 (classified pre-diabetes range)   Current glucose readings: not currently checking  Denies hypoglycemic/hyperglycemic symptoms  Current meal patterns: breakfast: eggs, or cereal; lunch: leftovers; dinner: meat + veggies; snacks: cookies; drinks: water, diet coke, water + powder   Current exercise: does yardwork  Recommended continue current regimen,  Hypertension:  Controlled; current treatment: metoprolol tartrate 2106mBID;   Current home readings: not currently checking  Denies hypotensive/hypertensive symptoms  Recommend continue current regimen Hyperlipidemia:  Controlled; current treatment:atorvastatin 409m  Recommended continue current regimen  Patient Goals/Self-Care Activities Over the next 180 days, patient will:  take medications as prescribed  Follow Up Plan: Telephone follow up appointment with care management team member scheduled for:  6 months        Medication Assistance: None required.  Patient affirms current coverage meets needs.  [facility]'s [facility]:  [address]one: [phone]: [phone] ([address] [facility]: [phone]: [address] [place] [place]one: [phone]: [phone] [address] [place]: [phone]: [phone]  Uses pill box? Yes Pt endorses 100% compliance  Follow Up:  Patient agrees to Care Plan and Follow-up.  Plan: Telephone follow up appointment with care management team member scheduled for:  6 months  Larinda Buttery, PharmD Clinical Pharmacist [facility] [phone]

## 2022-03-25 ENCOUNTER — Ambulatory Visit: Payer: Medicare Other | Admitting: Family Medicine

## 2022-03-29 ENCOUNTER — Telehealth: Payer: Self-pay | Admitting: Pulmonary Disease

## 2022-03-29 DIAGNOSIS — J84112 Idiopathic pulmonary fibrosis: Secondary | ICD-10-CM

## 2022-03-29 NOTE — Telephone Encounter (Signed)
Called patient and he is wanting a 90 day refill on his Esbriet. He states the one on file is 30 days.   Please advise.

## 2022-03-30 MED ORDER — PIRFENIDONE 267 MG PO TABS: 534.0000 mg | ORAL_TABLET | Freq: Three times a day (TID) | ORAL | 1 refills | Status: DC

## 2022-03-30 NOTE — Telephone Encounter (Signed)
Refill sent for ESBRIET to [facility] ([facility]) for Esbriet: [phone]  Dose: 534 mg three times daily (low dose due to previous GI issues and weight loss at higher dose)  Last OV: 03/02/22 Provider: Dr. Vaughan Browner  Next OV: 05/14/22  LFTs on 03/04/22 wnl  Jennye Boroughs Wilhemina Bonito, PharmD, MPH, BCPS Clinical Pharmacist (Rheumatology and Pulmonology)

## 2022-04-05 DIAGNOSIS — I10 Essential (primary) hypertension: Secondary | ICD-10-CM

## 2022-04-05 DIAGNOSIS — E785 Hyperlipidemia, unspecified: Secondary | ICD-10-CM | POA: Diagnosis not present

## 2022-04-05 DIAGNOSIS — E1159 Type 2 diabetes mellitus with other circulatory complications: Secondary | ICD-10-CM | POA: Diagnosis not present

## 2022-04-20 ENCOUNTER — Telehealth: Payer: Self-pay | Admitting: Pulmonary Disease

## 2022-04-20 NOTE — Telephone Encounter (Signed)
Patient has HRCT order and is scheduled 05/07/22 for [place].  [facility]. [facility] from [place] will need to be uploaded to [facility] canopy or CD will need to be made to be viewed by Dr. Vaughan Browner.   Message routed to [facility]

## 2022-04-29 NOTE — Telephone Encounter (Signed)
Order has been faxed to the [place] and patient has been called to make aware Fax # [phone]

## 2022-05-07 ENCOUNTER — Other Ambulatory Visit: Payer: Medicare Other

## 2022-05-14 ENCOUNTER — Ambulatory Visit (INDEPENDENT_AMBULATORY_CARE_PROVIDER_SITE_OTHER): Payer: Medicare Other | Admitting: Pulmonary Disease

## 2022-05-14 ENCOUNTER — Ambulatory Visit: Payer: Medicare Other | Admitting: Pulmonary Disease

## 2022-05-14 ENCOUNTER — Encounter: Payer: Self-pay | Admitting: Pulmonary Disease

## 2022-05-14 VITALS — BP 116/70 | HR 68 | Temp 97.6°F | Ht 68.0 in | Wt 168.0 lb

## 2022-05-14 DIAGNOSIS — Z5181 Encounter for therapeutic drug level monitoring: Secondary | ICD-10-CM | POA: Diagnosis not present

## 2022-05-14 DIAGNOSIS — J84112 Idiopathic pulmonary fibrosis: Secondary | ICD-10-CM

## 2022-05-14 LAB — PULMONARY FUNCTION TEST
DL/VA % pred: 120 %
DL/VA: 4.81 ml/min/mmHg/L
DLCO cor % pred: 63 %
DLCO cor: 13.85 ml/min/mmHg
DLCO unc % pred: 63 %
DLCO unc: 13.85 ml/min/mmHg
FEF 25-75 Post: 3.35 L/sec
FEF 25-75 Pre: 2.54 L/sec
FEF2575-%Change-Post: 31 %
FEF2575-%Pred-Post: 189 %
FEF2575-%Pred-Pre: 144 %
FEV1-%Change-Post: 3 %
FEV1-%Pred-Post: 57 %
FEV1-%Pred-Pre: 55 %
FEV1-Post: 1.44 L
FEV1-Pre: 1.39 L
FEV1FVC-%Change-Post: 1 %
FEV1FVC-%Pred-Pre: 128 %
FEV6-%Change-Post: 2 %
FEV6-%Pred-Post: 47 %
FEV6-%Pred-Pre: 46 %
FEV6-Post: 1.54 L
FEV6-Pre: 1.5 L
FEV6FVC-%Pred-Post: 107 %
FEV6FVC-%Pred-Pre: 107 %
FVC-%Change-Post: 2 %
FVC-%Pred-Post: 43 %
FVC-%Pred-Pre: 42 %
FVC-Post: 1.54 L
FVC-Pre: 1.5 L
Post FEV1/FVC ratio: 94 %
Post FEV6/FVC ratio: 100 %
Pre FEV1/FVC ratio: 92 %
Pre FEV6/FVC Ratio: 100 %
RV % pred: 56 %
RV: 1.34 L
TLC % pred: 46 %
TLC: 2.88 L

## 2022-05-14 NOTE — Progress Notes (Signed)
PFT done today. 

## 2022-05-14 NOTE — Progress Notes (Signed)
Todd Parsons    [id number]    [birth year]  Primary Care Physician:Metheney, Rene Kocher, MD  Referring Physician: Hali Marry, [address] [place],  [place] [address]  Chief complaint:  Follow-up for ILD Started Esbriet November 2022  HPI: 76 y.o.  ex-smoker with history of a flutter, aortic valve replacement, aortic aneurysm repair, hyperlipidemia, diabetes  Referred for evaluation of pulmonary fibrosis He was previously seen in 2018 by Dr. Elsworth Soho in the pulmonary clinic when he had a CT scan which showed pulmonary fibrosis.  He has been lost to follow-up since then. Complains of chronic dyspnea on exertion which has been stable over several years.  No symptoms at rest.  Denies any cough, sputum production, fevers, chills  Pets: Has a dog Occupation: Retired Advice worker for [employer] Exposures: No known exposures.  No mold, hot tub, Jacuzzi.  No feather pillows or comforter ILD questionnaire 06/17/2021-negative for exposures. Smoking history: 17-pack-year smoker.  Quit in 1979 Travel history: Originally from [place].  No significant recent travel Relevant family history: No family history of lung disease  Interim history: He had to reduce the dose of Esbriet since he was having issues with fatigue, loss of taste and weight loss.  Esbriet held and resumed at a lower dose around June 2023.  He is currently taking 2 tablets 3 times a day with improvement in side effects symptoms.  Finished cardiopulmonary rehab in early 2023 States that breathing is stable without any issues  Outpatient Encounter Medications as of 05/14/2022  Medication Sig   amLODipine (NORVASC) 10 MG tablet Take 10 mg by mouth daily.   aspirin EC 81 MG tablet Take 81 mg by mouth daily.   atorvastatin (LIPITOR) 40 MG tablet TAKE 1 TABLET BY MOUTH AT  BEDTIME   cetirizine (ZYRTEC) 10 MG tablet Take 10 mg by mouth as needed for allergies.   fenofibrate 160 MG tablet  TAKE 1 TABLET BY MOUTH  DAILY   finasteride (PROSCAR) 5 MG tablet TAKE 1 TABLET BY MOUTH  DAILY   FLUoxetine (PROZAC) 20 MG capsule Take 2 capsules (40 mg total) by mouth daily.   fluticasone (FLONASE) 50 MCG/ACT nasal spray Place 2 sprays into both nostrils daily as needed for allergies or rhinitis.   metFORMIN (GLUCOPHAGE-XR) 500 MG 24 hr tablet Take 1 tablet (500 mg total) by mouth daily with breakfast.   metoprolol succinate (TOPROL-XL) 50 MG 24 hr tablet Take 1 tablet (50 mg total) by mouth daily. Take with or immediately following a meal.   Pirfenidone (ESBRIET) 267 MG TABS Take 2 tablets (534 mg total) by mouth with breakfast, with lunch, and with evening meal. **low dose as maintenance**   tamsulosin (FLOMAX) 0.4 MG CAPS capsule Take 1 capsule (0.4 mg total) by mouth daily after supper. (Patient taking differently: Take 0.4 mg by mouth in the morning and at bedtime.)   No facility-administered encounter medications on file as of 05/14/2022.    Allergies as of 05/14/2022   (No Known Allergies)    Physical Exam: Blood pressure 116/70, pulse 68, temperature 97.6 F (36.4 C), temperature source Oral, height '5\' 8"'$  (1.727 m), weight 168 lb (76.2 kg), SpO2 94 %. Gen:      No acute distress HEENT:  EOMI, sclera anicteric Neck:     No masses; no thyromegaly Lungs:    Bibasal crackles CV:         Regular rate and rhythm; no  murmurs Abd:      + bowel sounds; soft, non-tender; no palpable masses, no distension Ext:    No edema; adequate peripheral perfusion Skin:      Warm and dry; no rash Neuro: alert and oriented x 3 Psych: normal mood and affect   Data Reviewed: Imaging: High-res CT 07/22/2017-pulmonary fibrosis, probable UIP High-res CT 09/09/2020-slight worsening of pulmonary fibrosis and probable UIP pattern I have reviewed the images personally.  PFTs: 08/24/2017 FVC 2.02 [50%], FEV1 1.89 [69%], F/F 94, TLC 3.62 [56%], DLCO 15.57 [5%] Severe restriction and diffusion  defect  05/14/2022 FVC 1.54 [43%], FEV1 1.44 [57%], F/F 94, TLC 2.88 [46%], DLCO 13.85 [63%] Severe restriction, moderate diffusion defect  Labs: CTD, hypersensitivity serologies 08/24/2017-negative ANA, CCP, negative hypersensitivity panel  Assessment:  Pulmonary fibrosis He had CT scans on record which shows pulmonary fibrosis slightly progressive.  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  There may be a hint of honeycombing at the base as well by my review.  Note CT reports from even before from the [place] that shows reticulation so the process appears to be progressive  Although the initial scan was read as hypersensitivity pneumonitis he does not have any known exposures or signs and symptoms of connective tissue disease.  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  PFTs reviewed with worsening restriction.  He has a CT scan coming up at the [place] and if worsening fibrosis is confirmed then we will discuss changing to Ofev as he does not want to go back to full dose of Esbriet  Cough, postnasal drip He will try over-the-counter antihistamine such as Benadryl, chlorpheniramine and steroid nasal spray Nasonex  Plan/Recommendations: Continue Esbriet Follow labs Follow CT to be done at the [facility] 05/14/2022, 2:30 PM  CC: Hali Marry, *

## 2022-05-14 NOTE — Patient Instructions (Signed)
I am glad you are stable with your breathing Continue the Esbriet as you are doing now We will review the CT at the [place] when complete Follow-up in 3 months.

## 2022-05-19 ENCOUNTER — Telehealth: Payer: Self-pay | Admitting: Pulmonary Disease

## 2022-05-19 NOTE — Telephone Encounter (Signed)
Called and spoke with patient.  Patient had HRCT ordered by Dr. Vaughan Browner completed today at the [facility].   I called [facility] nurse navigator Kenney Houseman [phone] for images to uploaded to [facility] PACS.  Left detailed message requesting HRCT image upload.   Will follow up.

## 2022-05-25 NOTE — Telephone Encounter (Signed)
I spoke with the [place] and they would not upload or send HRCT images without patient consent.  I called and spoke with patient. Patient stated he would contact the [facility] to have images sent to [facility] for Dr. Vaughan Browner to review.

## 2022-05-26 ENCOUNTER — Telehealth: Payer: Self-pay | Admitting: Pulmonary Disease

## 2022-05-26 NOTE — Telephone Encounter (Signed)
Called patient and left message for him to get a copy of the disk for the CT scan and bring into office for PM to look at. Nothing further needed

## 2022-05-31 ENCOUNTER — Telehealth: Payer: Self-pay | Admitting: Pulmonary Disease

## 2022-05-31 NOTE — Telephone Encounter (Signed)
CD received and will be reviewed by Dr. Vaughan Browner when he returns to office.

## 2022-06-25 ENCOUNTER — Encounter: Payer: Self-pay | Admitting: Primary Care

## 2022-06-25 ENCOUNTER — Telehealth: Payer: Self-pay | Admitting: Primary Care

## 2022-06-25 ENCOUNTER — Ambulatory Visit: Payer: Medicare Other | Admitting: Primary Care

## 2022-06-25 VITALS — BP 102/62 | HR 66 | Temp 97.4°F | Ht 67.0 in | Wt 168.8 lb

## 2022-06-25 DIAGNOSIS — J849 Interstitial pulmonary disease, unspecified: Secondary | ICD-10-CM

## 2022-06-25 DIAGNOSIS — J9611 Chronic respiratory failure with hypoxia: Secondary | ICD-10-CM

## 2022-06-25 DIAGNOSIS — J449 Chronic obstructive pulmonary disease, unspecified: Secondary | ICD-10-CM

## 2022-06-25 NOTE — Telephone Encounter (Addendum)
Patient has weight loss one Esbriet. Weight down 30 lbs over the last year and he has a decreased appetite. Weight has plateau recently and he is ok staying on medication but he is interested in possible research options for ILD/IPF treatment. He is doing well symptomatically, feels he could benefit from oxygen. Walking him today in office, if he does not drop will get 6MWT and ONO   Also, [facility] and is looking for results. He brought is disc around Labor day. I do not see anything up loaded in chart, are they in your Box Dr. Vaughan Browner?   CC: [facility]

## 2022-06-25 NOTE — Progress Notes (Unsigned)
$'76 y.o. Todd Parsons, male    DOB: [birth year], 76 y.o.   MRN: [id number]  Chief Complaint  Patient presents with   Follow-up    Discuss O2 therapy per pulmonary rehab.  Completed in 2022.  Decreased O2 sats with exertion.    Referring provider: Hali Marry, *  HPI: 76 y.o.  ex-smoker with history of a flutter, aortic valve replacement, aortic aneurysm repair, hyperlipidemia, diabetes  Referred for evaluation of pulmonary fibrosis He was previously seen in 2018 by Dr. Elsworth Soho in the pulmonary clinic when he had a CT scan which showed pulmonary fibrosis.  He has been lost to follow-up since then. Complains of chronic dyspnea on exertion which has been stable over several years.  No symptoms at rest.  Denies any cough, sputum production, fevers, chills  Pets: Has a dog Occupation: Retired Advice worker for [employer] Exposures: No known exposures.  No mold, hot tub, Jacuzzi.  No feather pillows or comforter ILD questionnaire 06/17/2021-negative for exposures. Smoking history: 17-pack-year smoker.  Quit in 1979 Travel history: Originally from [place].  No significant recent travel Relevant family history: No family history of lung disease  05/14/22- Dr, Vaughan Browner  He had to reduce the dose of Esbriet since he was having issues with fatigue, loss of taste and weight loss.  Esbriet held and resumed at a lower dose around June 2023.  He is currently taking 2 tablets 3 times a day with improvement in side effects symptoms.  Finished cardiopulmonary rehab in early 2023 States that breathing is stable without any issues   06/25/2022- Interim hx  Patient presents today to qualify for oxygen. Breathing is stable. No acute respiratory symptoms. Maintained on lower dose Esbriet since June 2023, currently taking 2 tablets three times a day. He completed cardiopulmonary rehab in early 2023 and at that time they had wanted to discharge him with oxygen. He stays as active as he can,  mows grass at home with push mower. There are times where he feels he may benefit from oxygen. He takes zyrtec daily for allergy symptoms. Antifibrotic suppresses his appetite, he has lost close to 30 lbs since starting medication 2022. Weight has plateau. [facility], he dropped off disc. No results uploaded in chart yet.   No Known Allergies  Immunization History  Administered Date(s) Administered   Fluad Quad(high Dose 65+) 05/23/2019, 06/20/2020, 07/07/2021   Influenza Split 05/19/2012   Influenza Whole 05/05/2011   Influenza, High Dose Seasonal PF 05/30/2017, 06/13/2018   Influenza,inj,Quad PF,6+ Mos 07/27/2013, 07/08/2014, 06/21/2015, 06/02/2016   Influenza-Unspecified 05/19/2012, 07/06/2015, 06/11/2022   PFIZER(Purple Top)SARS-COV-2 Vaccination 11/04/2019, 11/28/2019, 07/27/2020, 12/09/2020   Pneumococcal Conjugate-13 07/24/2014   Pneumococcal Polysaccharide-23 07/12/2010   Tdap 08/19/2011   Zoster, Live 06/11/2008    Past Medical History:  Diagnosis Date   Arthritis    osteoarthritis   Bicuspid aortic valve    Cataract    Coronary artery disease    Dysrhythmia    atrial flutter   Echocardiogram with ECG monitoring 01/12/2010   60-65% EF   Hyperlipidemia    Hypertension    Pre-diabetes     Tobacco History: Social History   Tobacco Use  Smoking Status Former   Years: 17.00   Types: Cigarettes   Quit date: 04/24/1978   Years since quitting: 44.2  Smokeless Tobacco Never   Counseling given: Not Answered   Outpatient Medications Prior to Visit  Medication Sig Dispense Refill   amLODipine ([place])  10 MG tablet Take 10 mg by mouth daily.     aspirin EC 81 MG tablet Take 81 mg by mouth daily.     atorvastatin (LIPITOR) 40 MG tablet TAKE 1 TABLET BY MOUTH AT  BEDTIME 90 tablet 3   cetirizine (ZYRTEC) 10 MG tablet Take 10 mg by mouth as needed for allergies.     fenofibrate 160 MG tablet TAKE 1 TABLET BY MOUTH  DAILY 90 tablet 3    finasteride (PROSCAR) 5 MG tablet TAKE 1 TABLET BY MOUTH  DAILY 90 tablet 3   FLUoxetine (PROZAC) 20 MG capsule Take 2 capsules (40 mg total) by mouth daily. 1 capsule 0   fluticasone (FLONASE) 50 MCG/ACT nasal spray Place 2 sprays into both nostrils daily as needed for allergies or rhinitis. 48 g 3   metFORMIN (GLUCOPHAGE-XR) 500 MG 24 hr tablet Take 1 tablet (500 mg total) by mouth daily with breakfast. 1 tablet 0   metoprolol succinate (TOPROL-XL) 50 MG 24 hr tablet Take 1 tablet (50 mg total) by mouth daily. Take with or immediately following a meal. 90 tablet 1   Pirfenidone (ESBRIET) 267 MG TABS Take 2 tablets (534 mg total) by mouth with breakfast, with lunch, and with evening meal. **low dose as maintenance** 540 tablet 1   tamsulosin (FLOMAX) 0.4 MG CAPS capsule Take 1 capsule (0.4 mg total) by mouth daily after supper. (Patient taking differently: Take 0.4 mg by mouth in the morning and at bedtime.) 90 capsule 3   No facility-administered medications prior to visit.      Review of Systems  Review of Systems   Physical Exam  BP 102/62 (BP Location: Left Arm, Patient Position: Sitting, Cuff Size: Normal)   Pulse 66   Temp (!) 97.4 F (36.3 C) (Oral)   Ht '5\' 7"'$  (1.702 m)   Wt 168 lb 12.8 oz (76.6 kg)   SpO2 94% Comment: room air  BMI 26.44 kg/m  Physical Exam   Lab Results:  CBC    Component Value Date/Time   WBC 8.7 12/25/2020 1253   WBC 18.7 (H) 03/02/2017 0509   RBC 5.51 12/25/2020 1253   RBC 3.62 (L) 03/02/2017 0509   HGB 16.3 12/25/2020 1253   HCT 48.7 12/25/2020 1253   PLT 243 12/25/2020 1253   MCV 88 12/25/2020 1253   MCH 29.6 12/25/2020 1253   MCH 29.6 03/02/2017 0509   MCHC 33.5 12/25/2020 1253   MCHC 32.8 03/02/2017 0509   RDW 13.0 12/25/2020 1253   LYMPHSABS 2.5 12/25/2020 1253   MONOABS 0.8 02/18/2017 1200   EOSABS 0.3 12/25/2020 1253   BASOSABS 0.1 12/25/2020 1253    BMET    Component Value Date/Time   NA 136 03/04/2022 1436   NA 140  12/25/2020 1253   K 3.8 03/04/2022 1436   CL 102 03/04/2022 1436   CO2 27 03/04/2022 1436   GLUCOSE 132 (H) 03/04/2022 1436   BUN 22 03/04/2022 1436   BUN 14 12/25/2020 1253   CREATININE 1.10 03/04/2022 1436   CREATININE 1.22 (H) 04/26/2019 1021   CALCIUM 9.6 03/04/2022 1436   GFRNONAA 67 09/09/2020 1418   GFRNONAA 58 (L) 04/26/2019 1021   GFRAA 77 09/09/2020 1418   GFRAA 67 04/26/2019 1021    BNP No results found for: "BNP"  ProBNP    Component Value Date/Time   PROBNP 281 11/23/2021 1023    Imaging: No results found.   Assessment & Plan:   IPF (idiopathic pulmonary fibrosis) ([place]) -  Symptomatically he remains stable.  Maintained on Esbriet 2 tablets 3 times daily.  He does report decreased appetite and weight loss.  [facility], results are not in Epic-we will track down.  He completed pulmonary rehab in early 2023.  Requires 2 L of oxygen with exertion.  Reffering to pulmonic for possible research opportunities.  Chronic respiratory failure with hypoxia ([place]) - He qualified for oxygen today with exertion. O2 dropped to 87% on RA after walking 721fet. He needs to use 2L continuous oxygen with exertion and at night. He was able to maintain O2 level >89-90% on 3L on POC. DME order placed.    EMartyn Ehrich NP 06/28/2022

## 2022-06-25 NOTE — Assessment & Plan Note (Addendum)
-   He qualified for oxygen today with exertion. O2 dropped to 87% on RA after walking 773fet. He needs to use 2L continuous oxygen with exertion and at night. He was able to maintain O2 level >89-90% on 3L on POC. DME order placed.

## 2022-06-25 NOTE — Assessment & Plan Note (Signed)
-   Symptomatically he remains stable.  Maintained on Esbriet 2 tablets 3 times daily.  He does report decreased appetite and weight loss.  [facility], results are not in Epic-we will track down.  He completed pulmonary rehab in early 2023.  Requires 2 L of oxygen with exertion.  Reffering to pulmonic for possible research opportunities.

## 2022-06-25 NOTE — Patient Instructions (Addendum)
Oxygen level dropped to 87% on room air, we will place an order for you to receive portable oxygen concentrator and home concentrator.  Please wear 2-3L oxygen with exertion and at night, goal maintain O2 >88-90%   Orders: New oxygen start - 2L with exertion / qualified for POC   Follow-up: 1-2 months with Dr. Vaughan Browner    Home Oxygen Use, Adult When a medical condition keeps you from getting enough oxygen, your health care provider may instruct you to take extra oxygen at home. Your health care provider will let you know: When to take oxygen. How long to take oxygen. How quickly oxygen should be delivered (flow rate), in liters per minute (LPM or L/M). Home oxygen can be given through: A mask. A nasal cannula. This is a device or tube that goes in the nostrils. A transtracheal catheter. This is a small, thin tube placed in the windpipe (trachea). A breathing tube (tracheostomy tube) that is surgically placed in the windpipe. This may be used in severe cases. These devices are connected with tubing to an oxygen source, such as: A tank. Tanks hold oxygen in gas form. They must be replaced when the oxygen is used up. A liquid oxygen device. This holds oxygen in liquid form. Liquid oxygen is very cold. It must be replaced when the oxygen is used up. An oxygen concentrator machine. This filters oxygen in the room. There are two types of oxygen concentrator machines--stationary and portable. A stationary oxygen concentrator machine plugs into the main electricity supply at your home. You must have a backup cylinder of oxygen in case the power goes out. A portable oxygen concentrator machine is smaller in size and more lightweight. This machine uses battery supply and can be used outside the home. Work with your health care provider to find equipment that works best for you and your lifestyle. What are the risks? Delivery of supplemental oxygen is generally safe. However, some risks  include: Fire. This can happen if the oxygen is exposed to a heat source, flame, or spark. Injury to skin. This can happen if liquid oxygen touches your skin. Damage to the lungs or other organs. This can happen from getting too little or too much oxygen. Supplies needed: To use oxygen, you will need: A mask, nasal cannula, transtracheal catheter, or tracheostomy. An oxygen tank, a liquid oxygen device, or an oxygen concentrator. The tape that your health care provider recommends (optional). Your health care provider may also recommend: A humidifier to warm and moisten the oxygen delivered. This will depend on how much oxygen you need and the type of home oxygen device you use. A pulse oximeter. This device measures the percentage of oxygen in your blood. How to use oxygen Your health care provider or a person from your [place] will show you how to use your oxygen device. Follow his or her instructions. The instructions may look something like this: Wash your hands with soap and water. If you use an oxygen concentrator, make sure it is plugged in. Place one end of the tube into the port on the tank, device, or machine. Place the mask over your nose and mouth. Or, place the nasal cannula and secure it with tape if instructed. If you use a tracheostomy or transtracheal catheter, connect it to the oxygen source as directed. Make sure the liter-flow setting on the machine is at the level prescribed by your health care provider. Turn on the machine or adjust the knob on the  tank or device to the correct liter-flow setting. When you are done, turn off and unplug the machine, or turn the knob to OFF. How to clean and care for the oxygen supplies Nasal cannula Clean it with a warm, wet cloth daily or as needed. Wash it with a liquid soap once a week. Rinse it thoroughly once or twice a week. Air-dry it. Replace it every 2-4 weeks. If you have an infection, such as a cold or  pneumonia, change the cannula when you get better. Mask Replace it every 2-4 weeks. If you have an infection, such as a cold or pneumonia, change the mask when you get better. Humidifier bottle Wash the bottle between each refill: Wash it with soap and warm water. Rinse it thoroughly. Clean it and its top with a disinfectant cleaner. Air-dry it. Make sure it is dry before you refill it. Oxygen concentrator Clean the air filter at least twice a week according to directions from your home medical equipment and service company. Wipe down the cabinet every day. To do this: Unplug the unit. Wipe down the cabinet with a damp cloth. Dry the cabinet. Other equipment Change any extra tubing every 1-3 months. Follow instructions from your health care provider about taking care of any other equipment. Safety tips Fire safety tips  Keep your oxygen and oxygen supplies at least 6 ft (2 m) away from sources of heat, flames, and sparks at all times. Do not allow smoking near your oxygen. Put up "no smoking" signs in your home. Avoid smoking areas when in public. Do not use materials that can burn (are flammable) while you use oxygen. This includes: Petroleum jelly. Hair spray or other aerosol sprays. Rubbing alcohol. Hand sanitizer. When you go to a restaurant with portable oxygen, ask to be seated in the non-smoking section. Keep a Data processing manager close by. Let your fire department know that you have oxygen in your home. Test your home smoke detectors regularly. Traveling Secure your oxygen tank in the vehicle so that it does not move around. Follow instructions from your medical device company about how to safely secure your tank. Make sure you have enough oxygen for the amount of time you will be away from home. If you are planning to travel by public transportation (airplane, train, bus, or boat), contact the company to find out if it allows the use of an approved portable oxygen  concentrator. You may also need documents from your health care provider and medical device company before you travel. General safety tips If you use an oxygen cylinder, make sure it is in a stand or secured to an object that will not move (fixed object). If you use liquid oxygen, make sure its container is kept upright at all times. If you use an oxygen concentrator: Tell Loss adjuster, chartered company. Make sure you are given priority service in the event that your power goes out. Avoid using extension cords if possible. Follow these instructions at home: Use oxygen only as told by your health care provider. Do not use alcohol or other drugs that make you relax (sedating drugs) unless instructed. They can slow down your breathing rate and make it hard to get in enough oxygen. Know how and when to order a refill of oxygen. Always keep a spare tank of oxygen. Plan ahead for holidays when you may not be able to get a prescription filled. Use water-based lubricants on your lips or nostrils. Do not use oil-based products like petroleum jelly. To  prevent skin irritation on your cheeks or behind your ears, tuck some gauze under the tubing. Where to find more information American Lung Association: DiabeticMale.de Contact a health care provider if: You get headaches often. You have a lasting cough. You are restless or have anxiety. You develop an illness that affects your breathing. You cannot exercise at your regular level. You have a fever. You have persistent redness under your nose. Get help right away if: You are confused. You are sleepy all the time. You have blue lips or fingernails. You have difficult or irregular breathing that is getting worse. You are struggling to breathe. These symptoms may represent a serious problem that is an emergency. Do not wait to see if the symptoms will go away. Get medical help right away. Call your local emergency services (911 in the [place] Do not drive  yourself to the hospital. Summary Your health care provider or a person from your [place] will show you how to use your oxygen device. Follow his or her instructions. If you use an oxygen concentrator, make sure it is plugged in. Make sure the liter-flow setting on the machine is at the level prescribed by your health care provider. Use oxygen only as told by your health care provider. Keep your oxygen and oxygen supplies at least 6 ft (2 m) away from sources of heat, flames, and sparks at all times. This information is not intended to replace advice given to you by your health care provider. Make sure you discuss any questions you have with your health care provider. Document Revised: 12/24/2021 Document Reviewed: 08/21/2019 Elsevier Patient Education  [place].

## 2022-06-29 DIAGNOSIS — J449 Chronic obstructive pulmonary disease, unspecified: Secondary | ICD-10-CM | POA: Diagnosis not present

## 2022-06-29 NOTE — Telephone Encounter (Signed)
ATC patient.  LM for patient to call back for CT results and recommendations from Dr. Vaughan Browner.

## 2022-06-29 NOTE — Telephone Encounter (Signed)
Please call the patient and let him know that I was able to review the CT from the [place].  Although it is reported as stable I think there is some worsening of scarring in the lung.  Please make a sooner visit to reassess therapy.  Okay to do video visit or in person visit.

## 2022-06-30 NOTE — Telephone Encounter (Signed)
ATC patient.  LM for patient to call back when available.  

## 2022-07-06 NOTE — Telephone Encounter (Signed)
Called and spoke with patient.  Dr. Matilde Bash recommendations given. Patient scheduled 07/23/22 with Dr. Vaughan Browner to review CT.

## 2022-07-14 DIAGNOSIS — I10 Essential (primary) hypertension: Secondary | ICD-10-CM | POA: Diagnosis not present

## 2022-07-14 DIAGNOSIS — Z9889 Other specified postprocedural states: Secondary | ICD-10-CM | POA: Diagnosis not present

## 2022-07-14 DIAGNOSIS — R0600 Dyspnea, unspecified: Secondary | ICD-10-CM | POA: Diagnosis not present

## 2022-07-14 DIAGNOSIS — Z952 Presence of prosthetic heart valve: Secondary | ICD-10-CM | POA: Diagnosis not present

## 2022-07-14 DIAGNOSIS — I25810 Atherosclerosis of coronary artery bypass graft(s) without angina pectoris: Secondary | ICD-10-CM | POA: Diagnosis not present

## 2022-07-19 ENCOUNTER — Telehealth: Payer: Self-pay | Admitting: Pulmonary Disease

## 2022-07-20 NOTE — Telephone Encounter (Signed)
Patient has OV on 07/23/22. He will need updated CMET at that visit for Esbriet monitoring. Can send additional refill after that visit and when labs result.  Knox Saliva, PharmD, MPH, BCPS, CPP Clinical Pharmacist (Rheumatology and Pulmonology)

## 2022-07-23 ENCOUNTER — Encounter: Payer: Self-pay | Admitting: Pulmonary Disease

## 2022-07-23 ENCOUNTER — Ambulatory Visit: Payer: Medicare Other | Admitting: Pulmonary Disease

## 2022-07-23 VITALS — BP 118/62 | HR 66 | Temp 97.6°F | Ht 67.0 in | Wt 170.0 lb

## 2022-07-23 DIAGNOSIS — Z5181 Encounter for therapeutic drug level monitoring: Secondary | ICD-10-CM | POA: Diagnosis not present

## 2022-07-23 DIAGNOSIS — J84112 Idiopathic pulmonary fibrosis: Secondary | ICD-10-CM | POA: Diagnosis not present

## 2022-07-23 NOTE — Progress Notes (Signed)
Todd Parsons    [id number]    December 09, 1945  Primary Care Physician:Metheney, Rene Kocher, MD  Referring Physician: Hali Marry, [address] [place],  [place] [address]  Chief complaint:  Follow-up for ILD Started Esbriet November 2022 Esbriet dose lowered in June 2023  HPI: 76 y.o.  ex-smoker with history of a flutter, aortic valve replacement, aortic aneurysm repair, hyperlipidemia, diabetes  Referred for evaluation of pulmonary fibrosis He was previously seen in 2018 by Dr. Elsworth Soho in the pulmonary clinic when he had a CT scan which showed pulmonary fibrosis.  He has been lost to follow-up since then. Complains of chronic dyspnea on exertion which has been stable over several years.  No symptoms at rest.  Denies any cough, sputum production, fevers, chills  Started Esbriet in November 2022 He had to reduce the dose of Esbriet since he was having issues with fatigue, loss of taste and weight loss.  Esbriet held and resumed at a lower dose around June 2023.  He is currently taking 2 tablets 3 times a day with improvement in side effects symptoms.  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  No mold, hot tub, Jacuzzi.  No feather pillows or comforter ILD questionnaire 06/17/2021-negative for exposures. Smoking history: 17-pack-year smoker.  Quit in 1979 Travel history: Originally from [place].  No significant recent travel Relevant family history: No family history of lung disease  Interim history: He is here for review of CT scan done at the [place].  States that breathing is stable with no issue.  Outpatient Encounter Medications as of 07/23/2022  Medication Sig   amLODipine (NORVASC) 10 MG tablet Take 10 mg by mouth daily.   aspirin EC 81 MG tablet Take 81 mg by mouth daily.   atorvastatin (LIPITOR) 40 MG tablet TAKE 1 TABLET BY MOUTH AT   BEDTIME   cetirizine (ZYRTEC) 10 MG tablet Take 10 mg by mouth as needed for allergies.   fenofibrate 160 MG tablet TAKE 1 TABLET BY MOUTH  DAILY   finasteride (PROSCAR) 5 MG tablet TAKE 1 TABLET BY MOUTH  DAILY   FLUoxetine (PROZAC) 20 MG capsule Take 2 capsules (40 mg total) by mouth daily.   fluticasone (FLONASE) 50 MCG/ACT nasal spray Place 2 sprays into both nostrils daily as needed for allergies or rhinitis.   metFORMIN (GLUCOPHAGE-XR) 500 MG 24 hr tablet Take 1 tablet (500 mg total) by mouth daily with breakfast.   metoprolol succinate (TOPROL-XL) 50 MG 24 hr tablet Take 1 tablet (50 mg total) by mouth daily. Take with or immediately following a meal.   Pirfenidone (ESBRIET) 267 MG TABS Take 2 tablets (534 mg total) by mouth with breakfast, with lunch, and with evening meal. **low dose as maintenance**   tamsulosin (FLOMAX) 0.4 MG CAPS capsule Take 1 capsule (0.4 mg total) by mouth daily after supper. (Patient taking differently: Take 0.4 mg by mouth 2 (two) times daily.)   No facility-administered encounter medications on file as of 07/23/2022.    Allergies as of 07/23/2022   (No Known Allergies)    Physical Exam: Blood pressure 118/62, pulse 66, temperature 97.6 F (36.4 C), temperature source Oral, height '5\' 7"'$  (1.702 m), weight 170 lb (77.1 kg), SpO2 95 %. Gen:      No acute distress HEENT:  EOMI, sclera anicteric Neck:     No  masses; no thyromegaly Lungs:   Bibasal crackles CV:         Regular rate and rhythm; no murmurs Abd:      + bowel sounds; soft, non-tender; no palpable masses, no distension Ext:    No edema; adequate peripheral perfusion Skin:      Warm and dry; no rash Neuro: alert and oriented x 3 Psych: normal mood and affect   Data Reviewed: Imaging: High-res CT 07/22/2017-pulmonary fibrosis, probable UIP High-res CT 09/09/2020-slight worsening of pulmonary fibrosis and probable UIP pattern High-res CT 05/19/2022-progression of pulmonary fibrosis I have  reviewed the images personally.  PFTs: 08/24/2017 FVC 2.02 [50%], FEV1 1.89 [69%], F/F 94, TLC 3.62 [56%], DLCO 15.57 [5%] Severe restriction and diffusion defect  05/14/2022 FVC 1.54 [43%], FEV1 1.44 [57%], F/F 94, TLC 2.88 [46%], DLCO 13.85 [63%] Severe restriction, moderate diffusion defect  Labs: CTD, hypersensitivity serologies 08/24/2017-negative ANA, CCP, negative hypersensitivity panel  Labs from primary care BNP 07/14/2022-120.5 CBC 07/14/2022-within normal limits Basic metabolic panel [id number].  Assessment:  Pulmonary fibrosis He had CT scans on record which shows pulmonary fibrosis slightly progressive.  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  There may be a hint of honeycombing at the base as well by my review.  Note CT reports from even before from the [facility] that shows reticulation so the process appears to be progressive  Although the initial scan was read as hypersensitivity pneumonitis he does not have any known exposures or signs and symptoms of connective tissue disease.  CTD serologies are negative   He likely has IPF based on above work-up PFTs reviewed with worsening restriction We also reviewed his CT scan from the [facility].  There was read as stable by my review there is progression compared to 2022 He continues on Esbriet at a lower dose and is not interested in going back to full dose Esbriet.  We discussed alternate treatment with Ofev but he would like to continue Esbriet for now and reassess in 6 months Follow-up CT in 6 months  Check hepatic panel.  He had a BNP recently at his primary care which was borderline elevated.  Cough, postnasal drip He will try over-the-counter antihistamine such as Benadryl, chlorpheniramine and steroid nasal spray Nasonex  Plan/Recommendations: Continue Esbriet Check hepatic panel Follow CT to be done at the [facility] 07/23/2022, 1:57 PM  CC: Hali Marry, *

## 2022-07-23 NOTE — Patient Instructions (Signed)
We have reviewed the CT from [place] which shows some progression of scarring in the lung Based on our discussion we will continue Esbriet at current dose We will check hepatic panel today Please talk to your primary to get high-resolution CT done at the [facility] in 6 months and get [place] the CD Follow-up in 6 months after CT scan.

## 2022-07-23 NOTE — Addendum Note (Signed)
Addended by: Elton Sin on: 07/23/2022 02:20 PM   Modules accepted: Orders

## 2022-07-26 NOTE — Telephone Encounter (Signed)
Per chart review, LFTs not drawn on 07/23/22 OV> Left VM for patinet advised tat we need those updated prior to sending additional esbriet refills  Knox Saliva, PharmD, MPH, BCPS, CPP Clinical Pharmacist (Rheumatology and Pulmonology)

## 2022-07-27 ENCOUNTER — Telehealth: Payer: Self-pay | Admitting: Family Medicine

## 2022-07-27 DIAGNOSIS — E119 Type 2 diabetes mellitus without complications: Secondary | ICD-10-CM

## 2022-07-27 MED ORDER — FLUOXETINE HCL 20 MG PO CAPS: 40.0000 mg | ORAL_CAPSULE | Freq: Every day | ORAL | 0 refills | Status: DC

## 2022-07-27 NOTE — Telephone Encounter (Signed)
Refill for 2 week supply sent. Please let patient know. Thanks.

## 2022-07-27 NOTE — Telephone Encounter (Signed)
Patient called wants to know if he can get a  refill fluoxetine ? Sent to [facility] please and thank you. He stated he needs about 10 days if [facility]

## 2022-07-30 DIAGNOSIS — J449 Chronic obstructive pulmonary disease, unspecified: Secondary | ICD-10-CM | POA: Diagnosis not present

## 2022-07-31 IMAGING — MR MR HEAD WO/W CM
12 series · 48 of 48 positions shown · IV contrast (gadavist)
Comparison: None.

CLINICAL DATA: Weight loss. Acute stroke suspected. 20 minutes
episode of confusion

EXAM:
MRI HEAD WITHOUT AND WITH CONTRAST
TECHNIQUE: Multiplanar, multiecho pulse sequences of the brain and surrounding
structures were obtained without and with intravenous contrast.
CONTRAST:  7.5mL GADAVIST GADOBUTROL 1 MMOL/ML IV SOLN

[Series 2: DWI · axial · 3.0mm · 1.20mm/px · z∈[-15,+143]mm · 7 of 109 slices shown (1 of 4)]
[im 1/109]
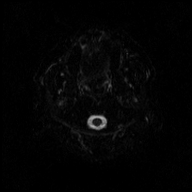
[im 19/109]
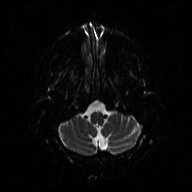
[im 37/109]
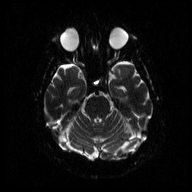
[im 55/109]
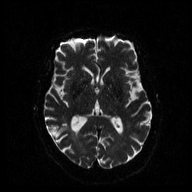
[im 73/109]
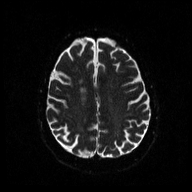
[im 91/109]
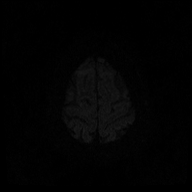
[im 109/109]
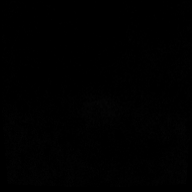

[Series 3: DWI · axial · 3.0mm · 1.20mm/px · z∈[-15,+143]mm · 3 of 55 slices shown (2 of 4)]
[im 1/55]
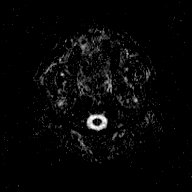
[im 28/55]
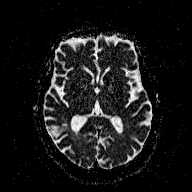
[im 55/55]
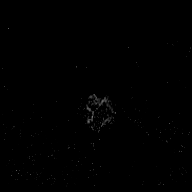

[Series 4: DWI · coronal · 3.0mm · 1.15mm/px · 5 of 86 slices shown (3 of 4)]
[im 1/86]
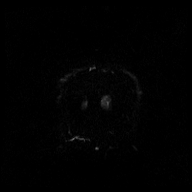
[im 22/86]
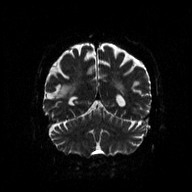
[im 43/86]
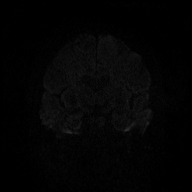
[im 64/86]
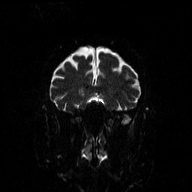
[im 86/86]
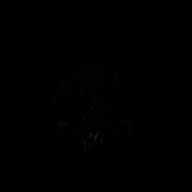

[Series 5: DWI · coronal · 3.0mm · 1.15mm/px · 3 of 45 slices shown (4 of 4)]
[im 1/45]
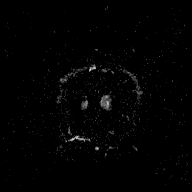
[im 23/45]
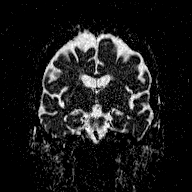
[im 45/45]
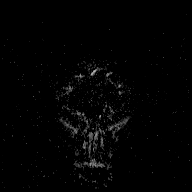

[Series 6: T1 · sagittal · 5.0mm · 0.45mm/px · 1 of 23 slices shown (1 of 2)]
[im 1/23]
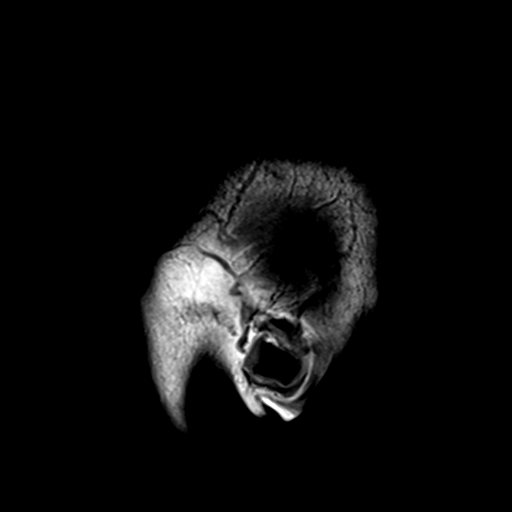

[Series 7: T2 · axial · 5.0mm · 0.72mm/px · 1 of 23 slices shown (1 of 2)]
[im 1/23]
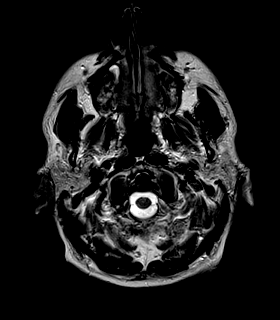

[Series 8: FLAIR · axial · 3.0mm · 0.45mm/px · z∈[-8,+148]mm · 3 of 55 slices shown]
[im 1/55]
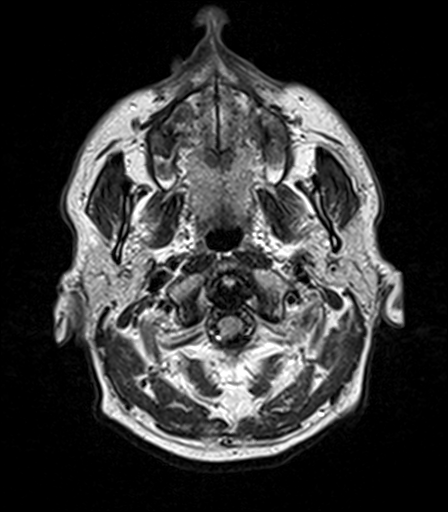
[im 28/55]
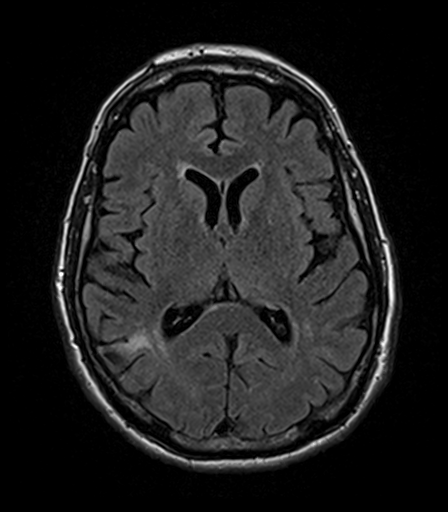
[im 55/55]
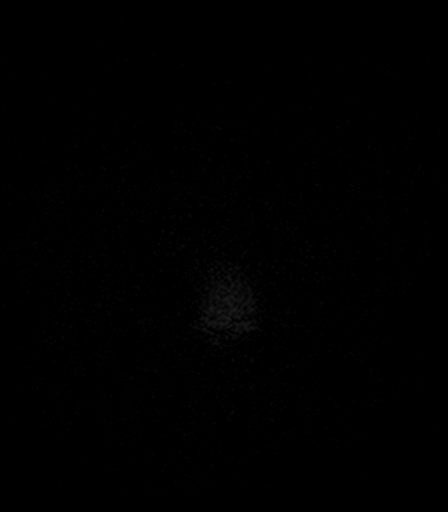

[Series 9: T2 · axial · 5.0mm · 0.72mm/px · 1 of 23 slices shown (2 of 2)]
[im 1/23]
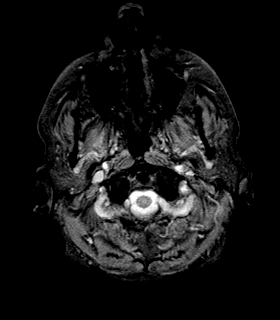

[Series 10: T1 · axial · 1.0mm · 1.00mm/px · z∈[-3,+150]mm · 10 of 160 slices shown (2 of 2)]
[im 1/160]
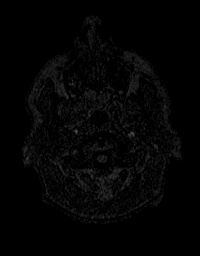
[im 18/160]
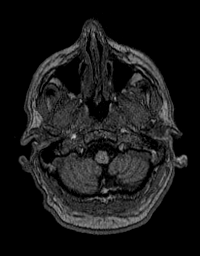
[im 36/160]
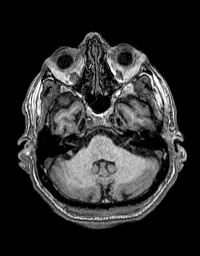
[im 54/160]
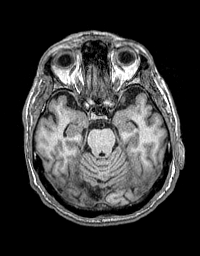
[im 71/160]
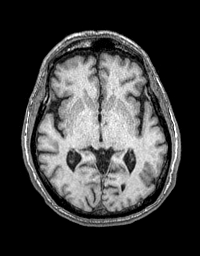
[im 89/160]
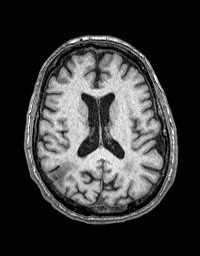
[im 107/160]
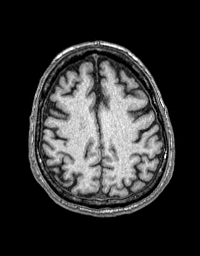
[im 124/160]
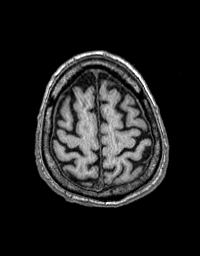
[im 142/160]
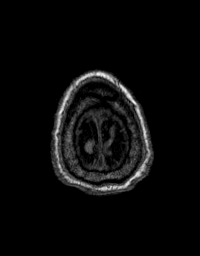
[im 160/160]
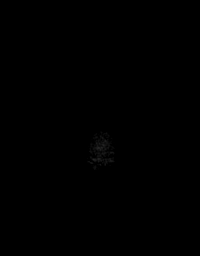

[Series 11: T2 post-contrast · coronal · 5.0mm · 0.43mm/px · 2 of 31 slices shown]
[im 1/31]
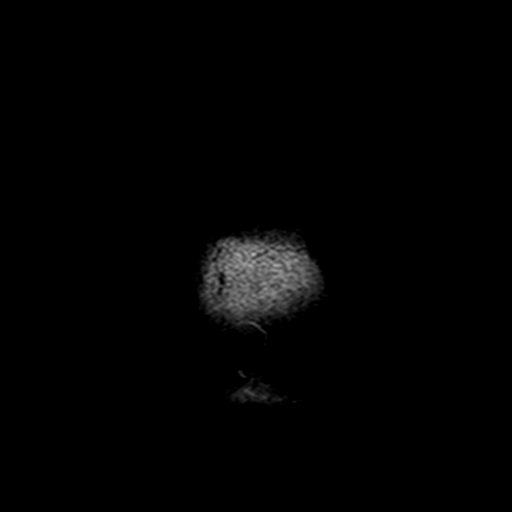
[im 31/31]
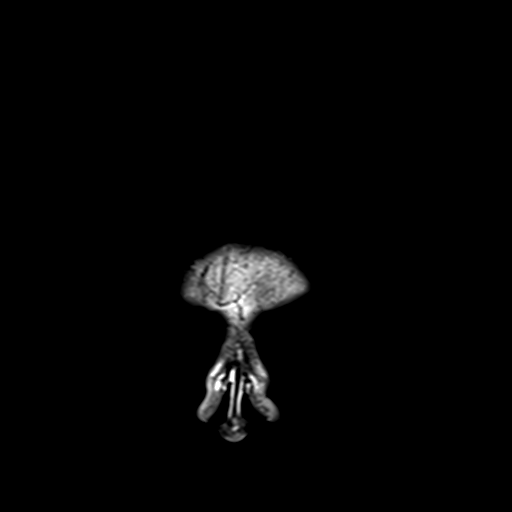

[Series 12: T1 post-contrast · axial · 1.0mm · 1.00mm/px · z∈[-3,+150]mm · 10 of 160 slices shown (1 of 2)]
[im 1/160]
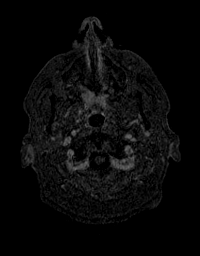
[im 18/160]
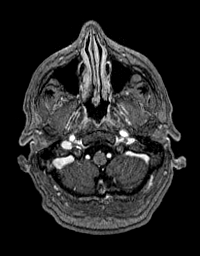
[im 36/160]
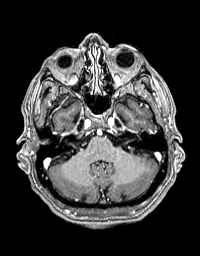
[im 54/160]
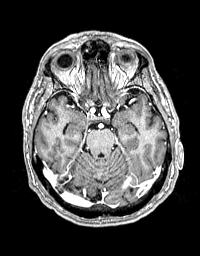
[im 71/160]
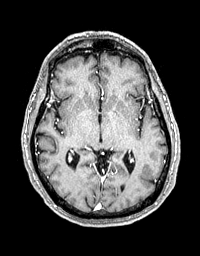
[im 89/160]
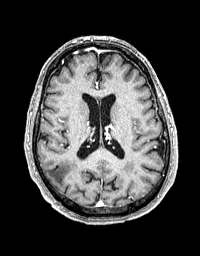
[im 107/160]
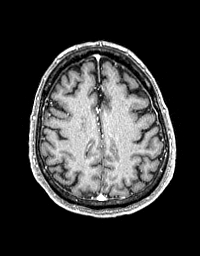
[im 124/160]
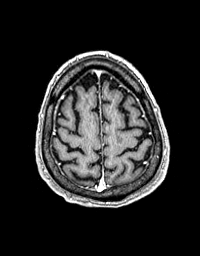
[im 142/160]
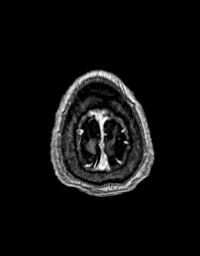
[im 160/160]
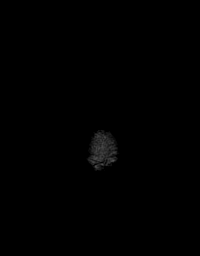

[Series 13: T1 post-contrast · coronal · 5.0mm · 0.43mm/px · 2 of 29 slices shown (2 of 2)]
[im 1/29]
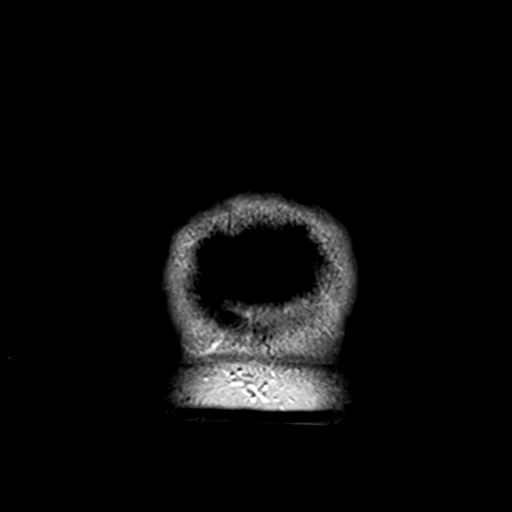
[im 29/29]
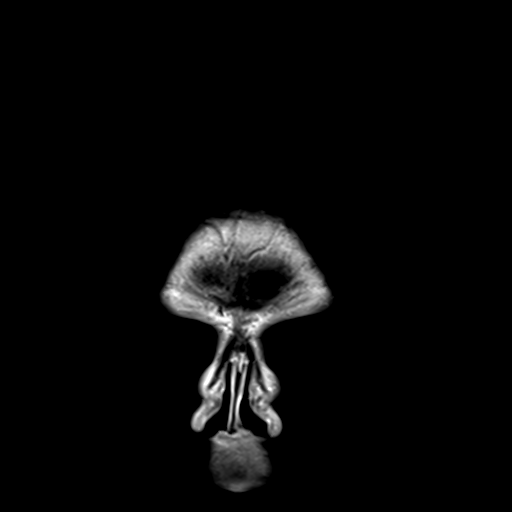

[48 of 48 positions shown; findings below may reference images not displayed]

FINDINGS: Brain: No acute or subacute infarction, hemorrhage, hydrocephalus,
extra-axial collection or mass lesion. Remote right temporoparietal
cortex infarct which is small to moderate in size. Limited chronic
small vessel ischemic changes in the cerebral white matter for age.
Normal brain volume. No abnormal enhancement. No generalized chronic
hemorrhagic injury

Vascular: Normal flow voids and vascular enhancements

Skull and upper cervical spine: Normal marrow signal

Sinuses/Orbits: Unremarkable
IMPRESSION: 1. No recent insult or specific cause for symptoms.
2. Small remote right occipital parietal infarct which is cortically
based.

## 2022-08-04 NOTE — Telephone Encounter (Signed)
No response from patient despite multiple attempts. Will f/u once refill request is received from pharmacy  Knox Saliva, PharmD, MPH, BCPS, CPP Clinical Pharmacist (Rheumatology and Pulmonology)

## 2022-08-09 ENCOUNTER — Telehealth: Payer: Self-pay

## 2022-08-09 ENCOUNTER — Ambulatory Visit (INDEPENDENT_AMBULATORY_CARE_PROVIDER_SITE_OTHER): Payer: Medicare Other | Admitting: Family Medicine

## 2022-08-09 DIAGNOSIS — Z0000 Encounter for general adult medical examination without abnormal findings: Secondary | ICD-10-CM | POA: Diagnosis not present

## 2022-08-09 DIAGNOSIS — N401 Enlarged prostate with lower urinary tract symptoms: Secondary | ICD-10-CM

## 2022-08-09 MED ORDER — TAMSULOSIN HCL 0.4 MG PO CAPS: 0.4000 mg | ORAL_CAPSULE | Freq: Two times a day (BID) | ORAL | 3 refills | Status: DC

## 2022-08-09 NOTE — Telephone Encounter (Signed)
Meds ordered this encounter  Medications   tamsulosin (FLOMAX) 0.4 MG CAPS capsule    Sig: Take 1 capsule (0.4 mg total) by mouth 2 (two) times daily.    Dispense:  180 capsule    Refill:  3

## 2022-08-09 NOTE — Patient Instructions (Signed)
[facility] Maintenance Summary and Written Plan of Care  Todd Parsons ,  Thank you for allowing me to perform your Medicare Annual Wellness Visit and for your ongoing commitment to your health.   Health Maintenance & Immunization History Health Maintenance  Topic Date Due   COVID-19 Vaccine (7 - 2023-24 season) 08/25/2022 (Originally 05/07/2022)   HEMOGLOBIN A1C  09/09/2022 (Originally 05/26/2022)   Zoster Vaccines- Shingrix (2 of 2) 08/13/2022   OPHTHALMOLOGY EXAM  10/08/2022   Diabetic kidney evaluation - Urine ACR  11/24/2022   FOOT EXAM  11/24/2022   Diabetic kidney evaluation - GFR measurement  03/05/2023   Medicare Annual Wellness (AWV)  08/10/2023   COLONOSCOPY (Pts 45-30yr Insurance coverage will need to be confirmed)  07/17/2026   DTaP/Tdap/Td (4 - Td or Tdap) 05/06/2032   Pneumonia Vaccine 77 Years old  Completed   INFLUENZA VACCINE  Completed   Hepatitis C Screening  Completed   HPV VACCINES  Aged Out   Immunization History  Administered Date(s) Administered   Fluad Quad(high Dose 65+) 05/23/2019, 06/20/2020, 07/07/2021   Influenza Split 06/04/2008, 07/22/2010, 05/19/2012   Influenza Whole 05/05/2011   Influenza, High Dose Seasonal PF 05/30/2017, 06/13/2018, 06/18/2022   Influenza,inj,Quad PF,6+ Mos 07/27/2013, 07/08/2014, 06/21/2015, 06/02/2016   Influenza-Unspecified 05/19/2012, 07/06/2015, 06/11/2022   PFIZER(Purple Top)SARS-COV-2 Vaccination 11/04/2019, 11/28/2019, 07/27/2020, 12/09/2020   Pfizer Covid-19 Vaccine Bivalent Booster 175yr& up 06/09/2021, 02/09/2022   Pneumococcal Conjugate-13 07/24/2014   Pneumococcal Polysaccharide-23 07/12/2010   Td 10/18/1997   Td (Adult),5 Lf Tetanus Toxid, Preservative Free 05/06/2022   Tdap 08/19/2011   Zoster Recombinat (Shingrix) 06/18/2022   Zoster, Live 10/03/2006, 06/11/2008, 07/30/2014    These are the patient goals that we discussed:  Goals Addressed               This Visit's  Progress     Patient Stated (pt-stated)        Patient stated that he would like to do more exercise.         This is a list of Health Maintenance Items that are overdue or due now: There are no preventive care reminders to display for this patient.    Orders/Referrals Placed Today: No orders of the defined types were placed in this encounter.  (Contact our referral department at [phone] you have not spoken with someone about your referral appointment within the next 5 days)    Follow-up Plan Follow-up with MeHali MarryMD as planned Medicare wellness visit in one year. Patient will access AVS on my chart.        Health Maintenance, Male Adopting a healthy lifestyle and getting preventive care are important in promoting health and wellness. Ask your health care provider about: The right schedule for you to have regular tests and exams. Things you can do on your own to prevent diseases and keep yourself healthy. What should I know about diet, weight, and exercise? Eat a healthy diet  Eat a diet that includes plenty of vegetables, fruits, low-fat dairy products, and lean protein. Do not eat a lot of foods that are high in solid fats, added sugars, or sodium. Maintain a healthy weight Body mass index (BMI) is a measurement that can be used to identify possible weight problems. It estimates body fat based on height and weight. Your health care provider can help determine your BMI and help you achieve or maintain a healthy weight. Get regular exercise Get regular exercise. This is one of  the most important things you can do for your health. Most adults should: Exercise for at least 150 minutes each week. The exercise should increase your heart rate and make you sweat (moderate-intensity exercise). Do strengthening exercises at least twice a week. This is in addition to the moderate-intensity exercise. Spend less time sitting. Even light physical activity can be  beneficial. Watch cholesterol and blood lipids Have your blood tested for lipids and cholesterol at 77 years of age, then have this test every 5 years. You may need to have your cholesterol levels checked more often if: Your lipid or cholesterol levels are high. You are older than 77 years of age. You are at high risk for heart disease. What should I know about cancer screening? Many types of cancers can be detected early and may often be prevented. Depending on your health history and family history, you may need to have cancer screening at various ages. This may include screening for: Colorectal cancer. Prostate cancer. Skin cancer. Lung cancer. What should I know about heart disease, diabetes, and high blood pressure? Blood pressure and heart disease High blood pressure causes heart disease and increases the risk of stroke. This is more likely to develop in people who have high blood pressure readings or are overweight. Talk with your health care provider about your target blood pressure readings. Have your blood pressure checked: Every 3-5 years if you are 77-18 years of age. Every year if you are 58 years old or older. If you are between the ages of 12 and 45 and are a current or former smoker, ask your health care provider if you should have a one-time screening for abdominal aortic aneurysm (AAA). Diabetes Have regular diabetes screenings. This checks your fasting blood sugar level. Have the screening done: Once every three years after age 40 if you are at a normal weight and have a low risk for diabetes. More often and at a younger age if you are overweight or have a high risk for diabetes. What should I know about preventing infection? Hepatitis B If you have a higher risk for hepatitis B, you should be screened for this virus. Talk with your health care provider to find out if you are at risk for hepatitis B infection. Hepatitis C Blood testing is recommended for: Everyone  born from [birth year] through 1966. Anyone with known risk factors for hepatitis C. Sexually transmitted infections (STIs) You should be screened each year for STIs, including gonorrhea and chlamydia, if: You are sexually active and are younger than 77 years of age. You are older than 77 years of age and your health care provider tells you that you are at risk for this type of infection. Your sexual activity has changed since you were last screened, and you are at increased risk for chlamydia or gonorrhea. Ask your health care provider if you are at risk. Ask your health care provider about whether you are at high risk for HIV. Your health care provider may recommend a prescription medicine to help prevent HIV infection. If you choose to take medicine to prevent HIV, you should first get tested for HIV. You should then be tested every 3 months for as long as you are taking the medicine. Follow these instructions at home: Alcohol use Do not drink alcohol if your health care provider tells you not to drink. If you drink alcohol: Limit how much you have to 0-2 drinks a day. Know how much alcohol is in your drink. In the  [place] one drink equals one 12 oz bottle of beer (355 mL), one 5 oz glass of wine (148 mL), or one 1 oz glass of hard liquor (44 mL). Lifestyle Do not use any products that contain nicotine or tobacco. These products include cigarettes, chewing tobacco, and vaping devices, such as e-cigarettes. If you need help quitting, ask your health care provider. Do not use street drugs. Do not share needles. Ask your health care provider for help if you need support or information about quitting drugs. General instructions Schedule regular health, dental, and eye exams. Stay current with your vaccines. Tell your health care provider if: You often feel depressed. You have ever been abused or do not feel safe at home. Summary Adopting a healthy lifestyle and getting preventive care are important  in promoting health and wellness. Follow your health care provider's instructions about healthy diet, exercising, and getting tested or screened for diseases. Follow your health care provider's instructions on monitoring your cholesterol and blood pressure. This information is not intended to replace advice given to you by your health care provider. Make sure you discuss any questions you have with your health care provider. Document Revised: 01/12/2021 Document Reviewed: 01/12/2021 Elsevier Patient Education  [employer].

## 2022-08-09 NOTE — Progress Notes (Signed)
MEDICARE ANNUAL WELLNESS VISIT  08/09/2022  Telephone Visit Disclaimer This Medicare AWV was conducted by telephone due to national recommendations for restrictions regarding the COVID-19 Pandemic (e.g. social distancing).  I verified, using two identifiers, that I am speaking with Todd Parsons or their authorized healthcare agent. I discussed the limitations, risks, security, and privacy concerns of performing an evaluation and management service by telephone and the potential availability of an in-person appointment in the future. The patient expressed understanding and agreed to proceed.  Location of Patient: Home Location of Provider (nurse):  In the office.  Subjective:    Todd Parsons is a 76 y.o. male patient of Metheney, Rene Kocher, MD who had a Medicare Annual Wellness Visit today via telephone. Todd Parsons is Retired and lives with their spouse. he has 3 children. he reports that he is socially active and does interact with friends/family regularly. he is moderately physically active and enjoys dancing and yardwork.  Patient Care Team: Hali Marry, MD as PCP - General (Family Medicine) Ellis Parents, MD as Attending Physician (Cardiology) Darius Bump, [facility] as Pharmacist (Pharmacist)     08/09/2022    1:14 PM 09/21/2021   10:37 AM 05/23/2021    2:05 PM 10/02/2018   10:18 AM 04/06/2017    8:04 AM 02/18/2017   11:36 AM 02/28/2014   10:18 AM  Advanced Directives  Does Patient Have a Medical Advance Directive? Yes Yes Yes No Yes No Patient does not have advance directive;Patient would not like information  Type of Advance Directive Living will [place];Living will Healthcare Power of [place];Living will    Does patient want to make changes to medical advance directive? No - Patient declined No - Patient declined       Copy of [place] in Chart?  Yes - validated most recent copy scanned in chart (See  row information) No - copy requested  No - copy requested    Would patient like information on creating a medical advance directive?    Yes (MAU/Ambulatory/Procedural Areas - Information given)  Yes (MAU/Ambulatory/Procedural Areas - Information given)     Hospital Utilization Over the Past 12 Months: # of hospitalizations or ER visits: 0 # of surgeries: 0  Review of Systems    Patient reports that his overall health is unchanged compared to last year.  History obtained from chart review and the patient  Patient Reported Readings (BP, Pulse, CBG, Weight, etc) none  Pain Assessment Pain : No/denies pain     Current Medications & Allergies (verified) Allergies as of 08/09/2022   No Known Allergies      Medication List        Accurate as of August 09, 2022  1:29 PM. If you have any questions, ask your nurse or doctor.          amLODipine 10 MG tablet Commonly known as: NORVASC Take 10 mg by mouth daily.   aspirin EC 81 MG tablet Take 81 mg by mouth daily.   atorvastatin 40 MG tablet Commonly known as: LIPITOR TAKE 1 TABLET BY MOUTH AT  BEDTIME   cetirizine 10 MG tablet Commonly known as: ZYRTEC Take 10 mg by mouth as needed for allergies.   fenofibrate 160 MG tablet TAKE 1 TABLET BY MOUTH  DAILY   finasteride 5 MG tablet Commonly known as: PROSCAR TAKE 1 TABLET BY MOUTH  DAILY   FLUoxetine 20 MG capsule Commonly  known as: PROZAC Take 2 capsules (40 mg total) by mouth daily.   fluticasone 50 MCG/ACT nasal spray Commonly known as: FLONASE Place 2 sprays into both nostrils daily as needed for allergies or rhinitis.   metFORMIN 500 MG 24 hr tablet Commonly known as: GLUCOPHAGE-XR Take 1 tablet (500 mg total) by mouth daily with breakfast.   metoprolol succinate 50 MG 24 hr tablet Commonly known as: TOPROL-XL Take 1 tablet (50 mg total) by mouth daily. Take with or immediately following a meal.   Pirfenidone 267 MG Tabs Commonly known as:  Esbriet Take 2 tablets (534 mg total) by mouth with breakfast, with lunch, and with evening meal. **low dose as maintenance**   tamsulosin 0.4 MG Caps capsule Commonly known as: FLOMAX Take 1 capsule (0.4 mg total) by mouth daily after supper. What changed: when to take this        History (reviewed): Past Medical History:  Diagnosis Date   Arthritis    osteoarthritis   Bicuspid aortic valve    Cataract    Coronary artery disease    Dysrhythmia    atrial flutter   Echocardiogram with ECG monitoring 01/12/2010   60-65% EF   Hyperlipidemia    Hypertension    Pre-diabetes    Past Surgical History:  Procedure Laterality Date   A-FLUTTER ABLATION  2017   ACROMIO-CLAVICULAR JOINT REPAIR Right 1980s       apnea device implant     then removed.     CARDIAC CATHETERIZATION  07/2015   CARDIAC VALVE REPLACEMENT  08/05/2015   Aortic valve   COLONOSCOPY     CORONARY ARTERY BYPASS GRAFT  08/05/2015   left knee arthoscopy     NOSE SURGERY  late 50's   to fix a broken nose   TONSILECTOMY, ADENOIDECTOMY, BILATERAL MYRINGOTOMY AND TUBES     TOTAL KNEE ARTHROPLASTY Right 02/28/2017   Procedure: RIGHT TOTAL KNEE ARTHROPLASTY;  Surgeon: Dorna Leitz, MD;  Location: [facility];  Service: Orthopedics;  Laterality: Right;   Family History  Problem Relation Age of Onset   Stroke Mother 30   Cancer Mother        upper palate   Heart disease Father 67       bypass surgery   Colon cancer Neg Hx    Colon polyps Neg Hx    Esophageal cancer Neg Hx    Rectal cancer Neg Hx    Stomach cancer Neg Hx    Social History   Socioeconomic History   Marital status: Married    Spouse name:  [name]   Number of children: 3   Years of education: 22   Highest education level: Master's degree (e.g., MA, MS, MEng, MEd, MSW, MBA)  Occupational History   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 Enjoys dancing.   Social Determinants of Health  Financial Resource Strain: Low Risk  (08/09/2022)   Overall Financial Resource Strain (CARDIA)    Difficulty of Paying Living Expenses: Not hard at all  Food Insecurity: No Food Insecurity (08/09/2022)   Hunger Vital Sign    Worried About Running Out of Food in the Last Year: Never true    Ran Out of Food in the Last Year: Never true  Transportation Needs: No Transportation Needs (08/09/2022)   PRAPARE - Hydrologist (Medical): No    Lack of Transportation (Non-Medical): No  Physical Activity: Inactive (08/09/2022)   Exercise Vital Sign    Days of Exercise per Week: 0 days    Minutes of Exercise per Session: 0 min  Stress: No Stress Concern Present (08/09/2022)   Continental    Feeling of Stress : Not at all  Social Connections: Moderately Isolated (08/09/2022)   Social Connection and Isolation Panel [NHANES]    Frequency of Communication with Friends and Family: Three times a week    Frequency of Social Gatherings with Friends and Family: Twice a week    Attends Religious Services: Never    Marine scientist or Organizations: No    Attends Archivist Meetings: Never    Marital Status: Married    Activities of Daily Living    08/09/2022    1:18 PM  In your present state of health, do you have any difficulty performing the following activities:  Hearing? 0  Comment wears bilateral hearing aids.  Vision? 0  Comment wears  glasses  Difficulty concentrating or making decisions? 0  Walking or climbing stairs? 0  Dressing or bathing? 0  Doing errands, shopping? 0  Preparing Food and eating ? N  Using the Toilet? N  In the past six months, have you accidently leaked urine? Y  Comment sometimes  Do you have problems with loss of bowel control? N  Managing your Medications? N  Managing your Finances? N  Housekeeping or managing your Housekeeping? N    Patient Education/ Literacy How often do you need to have someone help you when you read instructions, pamphlets, or other written materials from your doctor or pharmacy?: 1 - Never What is the last grade level you completed in school?: masters degree  Exercise Current Exercise Habits: Home exercise routine, Type of exercise: walking, Time (Minutes): 30, Frequency (Times/Week): 3, Weekly Exercise (Minutes/Week): 90, Intensity: Moderate, Exercise limited by: respiratory conditions(s)  Diet Patient reports consuming  2  meals a day and 1 snack(s) a day Patient reports that his primary diet is: Regular Patient reports that she does have regular access to food.   Depression Screen    08/09/2022    1:17 PM 11/26/2021    9:30 AM 09/21/2021   10:58 AM 05/23/2021    2:02 PM 12/25/2020   12:49 PM 08/26/2020    1:12 PM 04/26/2019    8:03 AM  PHQ 2/9 Scores  PHQ - 2 Score 0 0 0 0 0 0 0  PHQ- 9 Score  0 0         Fall Risk    08/09/2022    1:16 PM 09/21/2021   10:55 AM 05/23/2021    2:06 PM 08/26/2020    1:13 PM 04/26/2019    8:02 AM  Fall Risk   Falls in the past year? 0 0 0 0 0  Number falls in past yr: 0 0 0  0  Injury with Fall? 0 0 0  0  Risk for fall due to : No Fall Risks Impaired vision Impaired vision No Fall Risks   Risk for fall due to: Comment  wears glasses     Follow up Falls evaluation completed Falls prevention discussed;Education provided Falls prevention discussed       Objective:  Emmanuelle Coxe Tetzloff seemed alert and oriented and he  participated appropriately during our telephone visit.  Blood Pressure Weight BMI  BP Readings from Last 3 Encounters:  07/23/22 118/62  06/25/22 102/62  05/14/22 116/70   Wt Readings from Last 3 Encounters:  07/23/22 170 lb (77.1 kg)  06/25/22 168 lb 12.8 oz (76.6 kg)  05/14/22 168 lb (76.2 kg)   BMI Readings from Last 1 Encounters:  07/23/22 26.63 kg/m    *Unable to obtain current vital signs, weight, and BMI due to telephone visit type  Hearing/Vision  Hedy Camara did not seem to have difficulty with hearing/understanding during the telephone conversation Reports that he has had a formal eye exam by an eye care professional within the past year Reports that he has had a formal hearing evaluation within the past year *Unable to fully assess hearing and vision during telephone visit type  Cognitive Function:    08/09/2022    1:23 PM 05/23/2021    2:10 PM 10/02/2018   10:26 AM  6CIT Screen  What Year? 0 points 0 points 0 points  What month? 0 points 0 points 0 points  What time? 0 points 0 points 0 points  Count back from 20 0 points 0 points 0 points  Months in reverse 0 points 0 points 0 points  Repeat phrase 0 points 0 points 0 points  Total Score 0 points 0 points 0 points   (Normal:0-7, Significant for Dysfunction: >8)  Normal Cognitive Function Screening: Yes   Immunization & Health Maintenance Record Immunization History  Administered Date(s) Administered   Fluad Quad(high Dose 65+) 05/23/2019, 06/20/2020, 07/07/2021   Influenza Split 06/04/2008, 07/22/2010, 05/19/2012   Influenza Whole 05/05/2011   Influenza, High Dose Seasonal PF 05/30/2017, 06/13/2018, 06/18/2022   Influenza,inj,Quad PF,6+ Mos 07/27/2013, 07/08/2014, 06/21/2015, 06/02/2016   Influenza-Unspecified 05/19/2012, 07/06/2015, 06/11/2022   PFIZER(Purple Top)SARS-COV-2 Vaccination 11/04/2019, 11/28/2019, 07/27/2020, 12/09/2020   Pfizer Covid-19 Vaccine Bivalent Booster 60yr & up 06/09/2021, 02/09/2022    Pneumococcal Conjugate-13 07/24/2014   Pneumococcal Polysaccharide-23 07/12/2010   Td 10/18/1997   Td (Adult),5 Lf Tetanus Toxid, Preservative Free 05/06/2022   Tdap 08/19/2011   Zoster Recombinat (Shingrix) 06/18/2022   Zoster, Live 10/03/2006, 06/11/2008, 07/30/2014    Health Maintenance  Topic Date Due   COVID-19 Vaccine (7 - 2023-24 season) 08/25/2022 (Originally 05/07/2022)   HEMOGLOBIN A1C  09/09/2022 (Originally 05/26/2022)   Zoster Vaccines- Shingrix (2 of 2) 08/13/2022   OPHTHALMOLOGY EXAM  10/08/2022   Diabetic kidney evaluation - Urine ACR  11/24/2022   FOOT EXAM  11/24/2022   Diabetic kidney evaluation - GFR measurement  03/05/2023   Medicare Annual Wellness (AWV)  08/10/2023   COLONOSCOPY (Pts 45-449yrInsurance coverage will need to be confirmed)  07/17/2026   DTaP/Tdap/Td (4 - Td or Tdap) 05/06/2032   Pneumonia Vaccine 6515Years old  Completed   INFLUENZA VACCINE  Completed   Hepatitis C Screening  Completed   HPV VACCINES  Aged Out       Assessment  This is a routine wellness examination for EaAutoZoneochim.  Health Maintenance: Due or Overdue There are no preventive care reminders to display  for this patient.   Todd Co Spain does not need a referral for Community Assistance: Care Management:   no Social Work:    no Prescription Assistance:  no Nutrition/Diabetes Education:  no   Plan:  Personalized Goals  Goals Addressed               This Visit's Progress     Patient Stated (pt-stated)        Patient stated that he would like to do more exercise.       Personalized Health Maintenance & Screening Recommendations  There are no preventive care reminders to display for this patient.  Lung Cancer Screening Recommended: no (Low Dose CT Chest recommended if Age 36-80 years, 20 pack-year currently smoking OR have quit w/in past 15 years) Hepatitis C Screening recommended: no HIV Screening recommended: no  Advanced Directives: Written information  was not prepared per patient's request.  Referrals & Orders No orders of the defined types were placed in this encounter.   Follow-up Plan Follow-up with Hali Marry, MD as planned Medicare wellness visit in one year. Patient will access AVS on my chart.   I have personally reviewed and noted the following in the patient's chart:   Medical and social history Use of alcohol, tobacco or illicit drugs  Current medications and supplements Functional ability and status Nutritional status Physical activity Advanced directives List of other physicians Hospitalizations, surgeries, and ER visits in previous 12 months Vitals Screenings to include cognitive, depression, and falls Referrals and appointments  In addition, I have reviewed and discussed with Todd Co Blankenbeckler certain preventive protocols, quality metrics, and best practice recommendations. A written personalized care plan for preventive services as well as general preventive health recommendations is available and can be mailed to the patient at his request.      Tinnie Gens, RN BSN  08/09/2022

## 2022-08-09 NOTE — Telephone Encounter (Signed)
Patient would like to know if his Rx could be filled for tamsulosin (FLOMAX) 0.4 MG CAPS capsule?  He stated that he takes two tablets a day. He will be out soon. Please advise?

## 2022-08-13 ENCOUNTER — Ambulatory Visit: Payer: Medicare Other | Admitting: Pulmonary Disease

## 2022-09-27 ENCOUNTER — Other Ambulatory Visit: Payer: Self-pay | Admitting: Pharmacist

## 2022-09-27 DIAGNOSIS — J84112 Idiopathic pulmonary fibrosis: Secondary | ICD-10-CM

## 2022-09-27 MED ORDER — PIRFENIDONE 267 MG PO TABS: 534.0000 mg | ORAL_TABLET | Freq: Three times a day (TID) | ORAL | 0 refills | Status: DC

## 2022-09-27 NOTE — Telephone Encounter (Signed)
Refill sent for ESBRIET to [facility] ([facility]) for Esbriet: [phone] x 1 month only (patient due for labs)  Dose: 534 mg three times daily  Last OV: 07/23/2022 Provider: Dr. Vaughan Browner  Next OV: due in may 2024 (after updated CT)  Knox Saliva, PharmD, MPH, BCPS Clinical Pharmacist (Rheumatology and Pulmonology)

## 2022-09-30 ENCOUNTER — Ambulatory Visit (INDEPENDENT_AMBULATORY_CARE_PROVIDER_SITE_OTHER): Payer: Medicare Other | Admitting: Pharmacist

## 2022-09-30 DIAGNOSIS — E119 Type 2 diabetes mellitus without complications: Secondary | ICD-10-CM

## 2022-09-30 DIAGNOSIS — I10 Essential (primary) hypertension: Secondary | ICD-10-CM

## 2022-09-30 DIAGNOSIS — E785 Hyperlipidemia, unspecified: Secondary | ICD-10-CM

## 2022-09-30 NOTE — Progress Notes (Signed)
Chronic Care Management Pharmacy Note  09/30/2022 Name:  Todd Parsons MRN:  [id number] DOB:  1946/02/09  Summary: addressed DM, HTN, HLD. Updated medication list. He is doing well with medications and chronic conditions.  Recommendations/Changes made from today's visit: no changes, pt is doing well  Plan: f/u with pharmacist as needed  Subjective: Todd Parsons is an 77 y.o. year old male who is a primary patient of Metheney, Rene Kocher, MD.  The CCM team was consulted for assistance with disease management and care coordination needs.    Engaged with patient by telephone for follow up visit in response to provider referral for pharmacy case management and/or care coordination services.   Consent to Services:  The patient was given information about Chronic Care Management services, agreed to services, and gave verbal consent prior to initiation of services.  Please see initial visit note for detailed documentation.   Patient Care Team: Hali Marry, MD as PCP - General (Family Medicine) Ellis Parents, MD as Attending Physician (Cardiology) Darius Bump, [facility] as Pharmacist (Pharmacist)  Objective:  Lab Results  Component Value Date   CREATININE 1.10 03/04/2022   CREATININE 0.96 01/04/2022   CREATININE 0.99 11/23/2021    Lab Results  Component Value Date   HGBA1C 5.7 (A) 11/23/2021       Component Value Date/Time   CHOL 118 12/31/2019 1101   TRIG 126 12/31/2019 1101   HDL 34 (L) 12/31/2019 1101   CHOLHDL 5.4 (H) 10/16/2018 1159   VLDL 29 04/05/2016 0833   LDLCALC 61 12/31/2019 1101   LDLCALC 65 10/16/2018 1159       Latest Ref Rng & Units 03/04/2022    2:36 PM 01/04/2022    3:16 PM 11/23/2021   10:23 AM  Hepatic Function  Total Protein 6.0 - 8.3 g/dL 6.8  7.3  6.6   Albumin 3.5 - 5.2 g/dL 4.2  4.4  4.1   AST 0 - 37 U/L '21  25  21   '$ ALT 0 - 53 U/L '15  17  14   '$ Alk Phosphatase 39 - 117 U/L 51  52  64   Total Bilirubin 0.2 - 1.2 mg/dL 0.7  0.6  0.4      Lab Results  Component Value Date/Time   TSH 1.470 09/09/2020 02:18 PM   TSH 3.31 04/05/2016 08:33 AM       Latest Ref Rng & Units 12/25/2020   12:53 PM 09/30/2017    1:08 PM 03/02/2017    5:09 AM  CBC  WBC 3.4 - 10.8 x10E3/uL 8.7  7.9  18.7   Hemoglobin 13.0 - 17.7 g/dL 16.3  14.8  10.7   Hematocrit 37.5 - 51.0 % 48.7  42.7  32.6   Platelets 150 - 450 x10E3/uL 243  225  150     Social History   Tobacco Use  Smoking Status Former   Years: 17.00   Types: Cigarettes   Quit date: 04/24/1978   Years since quitting: 44.4  Smokeless Tobacco Never   BP Readings from Last 3 Encounters:  07/23/22 118/62  06/25/22 102/62  05/14/22 116/70   Pulse Readings from Last 3 Encounters:  07/23/22 66  06/25/22 66  05/14/22 68   Wt Readings from Last 3 Encounters:  07/23/22 170 lb (77.1 kg)  06/25/22 168 lb 12.8 oz (76.6 kg)  05/14/22 168 lb (76.2 kg)    Assessment: Review of patient past medical history, allergies, medications, health status, including review of consultants  reports, laboratory and other test data, was performed as part of comprehensive evaluation and provision of chronic care management services.   SDOH:  (Social Determinants of Health) assessments and interventions performed:    CCM Care Plan  No Known Allergies  Medications Reviewed Today     Reviewed by Tinnie Gens, RN (Registered Nurse) on 08/09/22 at 1307  Med List Status: <None>   Medication Order Taking? Sig Documenting Provider Last Dose Status Informant  amLODipine (NORVASC) 10 MG tablet [id number] Yes Take 10 mg by mouth daily. [provider] Taking Active            Med Note [name], Vilinda Blanks   Fri Aug 21, 2021 10:00 AM)    aspirin EC 81 MG tablet [id number] Yes Take 81 mg by mouth daily. [provider] Taking Active Self  atorvastatin (LIPITOR) 40 MG tablet [id number] Yes TAKE 1 TABLET BY MOUTH AT  BEDTIME Hali Marry, MD Taking Active   cetirizine (ZYRTEC) 10 MG  tablet [id number] Yes Take 10 mg by mouth as needed for allergies. [provider] Taking Active   fenofibrate 160 MG tablet [id number] Yes TAKE 1 TABLET BY MOUTH  DAILY Hali Marry, MD Taking Active   finasteride (PROSCAR) 5 MG tablet [id number] Yes TAKE 1 TABLET BY MOUTH  DAILY Hali Marry, MD Taking Active   FLUoxetine (PROZAC) 20 MG capsule [id number] Yes Take 2 capsules (40 mg total) by mouth daily. Hali Marry, MD Taking Active   fluticasone Asencion Islam) 50 MCG/ACT nasal spray [id number] Yes Place 2 sprays into both nostrils daily as needed for allergies or rhinitis. Hali Marry, MD Taking Active   metFORMIN (GLUCOPHAGE-XR) 500 MG 24 hr tablet [id number] Yes Take 1 tablet (500 mg total) by mouth daily with breakfast. Hali Marry, MD Taking Active   metoprolol succinate (TOPROL-XL) 50 MG 24 hr tablet [id number] Yes Take 1 tablet (50 mg total) by mouth daily. Take with or immediately following a meal. Hali Marry, MD Taking Active            Med Note Dewitt Rota Sep 21, 2021 10:46 AM) Metoprolol tartrate  Pirfenidone (ESBRIET) 267 MG TABS [id number] Yes Take 2 tablets (534 mg total) by mouth with breakfast, with lunch, and with evening meal. **low dose as maintenance** Cobb, Karie Schwalbe, NP Taking Active   tamsulosin (FLOMAX) 0.4 MG CAPS capsule [id number] Yes Take 1 capsule (0.4 mg total) by mouth daily after supper.  Patient taking differently: Take 0.4 mg by mouth 2 (two) times daily.   Hali Marry, MD Taking Active             Patient Active Problem List   Diagnosis Date Noted   Chronic respiratory failure with hypoxia ([place]) 06/25/2022   Memory changes 11/23/2021   Weight loss 08/26/2020   Unspecified mood (affective) disorder ([place]) 08/26/2020   Benign prostatic hyperplasia (BPH) with urinary urgency 04/25/2020   Word finding difficulty 04/25/2020   Seborrheic keratoses 09/10/2019   Acute pain of right  shoulder 09/10/2019   CAD (coronary artery disease) 04/15/2017   Wears hearing aid 06/02/2016   OAB (overactive bladder) 04/02/2016   Primary osteoarthritis of both knees 02/10/2016   IPF (idiopathic pulmonary fibrosis) ([place]) 12/22/2015   H/O aortic valve replacement 09/15/2015   History of atrial flutter 09/15/2015   Type 2 diabetes mellitus with cardiac complication ([place]) 69/67/8938   Restrictive airway disease 08/09/2014   Hx of arthroscopy of knee  06/08/2011   Essential hypertension, benign 05/05/2011   Hyperlipidemia LDL goal <100 05/05/2011   OSA (obstructive sleep apnea) 05/05/2011   Thoracic aortic aneurysm without rupture ([place]) 05/05/2011   Congenital insufficiency of aortic valve 05/05/2011   History of bicuspid aortic valve 05/05/2011   Vitamin D deficiency 05/05/2011    Immunization History  Administered Date(s) Administered   Fluad Quad(high Dose 65+) 05/23/2019, 06/20/2020, 07/07/2021   Influenza Split 06/04/2008, 07/22/2010, 05/19/2012   Influenza Whole 05/05/2011   Influenza, High Dose Seasonal PF 05/30/2017, 06/13/2018, 06/18/2022   Influenza,inj,Quad PF,6+ Mos 07/27/2013, 07/08/2014, 06/21/2015, 06/02/2016   Influenza-Unspecified 05/19/2012, 07/06/2015, 06/11/2022   PFIZER(Purple Top)SARS-COV-2 Vaccination 11/04/2019, 11/28/2019, 07/27/2020, 12/09/2020   Pfizer Covid-19 Vaccine Bivalent Booster 63yr & up 06/09/2021, 02/09/2022   Pneumococcal Conjugate-13 07/24/2014   Pneumococcal Polysaccharide-23 07/12/2010   Td 10/18/1997   Td (Adult),5 Lf Tetanus Toxid, Preservative Free 05/06/2022   Tdap 08/19/2011   Zoster Recombinat (Shingrix) 06/18/2022   Zoster, Live 10/03/2006, 06/11/2008, 07/30/2014    Conditions to be addressed/monitored: HTN, HLD, and DMII  There are no care plans that you recently modified to display for this patient.      Medication Assistance: None required.  Patient affirms current coverage meets needs.  Patient's preferred pharmacy  is:  [address]Phone: [phone]: [phone] [facility] ([address]Phone: [phone]: [address]Phone: [phone]: [phone] [facility] [address]Phone: [phone]: [phone]  Uses pill box? Yes Pt endorses 100% compliance  Follow Up:  Patient agrees to Care Plan and Follow-up.  Plan: The patient has been provided with contact information for the care management team and has been advised to call with any health related questions or concerns.   KLarinda Buttery PharmD Clinical Pharmacist CRiva Road Surgical [facility] LL[phone]

## 2022-10-20 ENCOUNTER — Telehealth: Payer: Self-pay | Admitting: Pulmonary Disease

## 2022-10-20 DIAGNOSIS — Z5181 Encounter for therapeutic drug level monitoring: Secondary | ICD-10-CM

## 2022-10-20 DIAGNOSIS — J84112 Idiopathic pulmonary fibrosis: Secondary | ICD-10-CM

## 2022-10-21 NOTE — Telephone Encounter (Signed)
Patient is significantly overdue for LFT check while on Esbriet. I spoke with patient - he confirms no labs have been checked. He will stop by lab next week. Future order for CMET placed today  Refill for Esbriet will be sent after labs result. Will continue to f/u  Knox Saliva, PharmD, MPH, BCPS, CPP Clinical Pharmacist (Rheumatology and Pulmonology)

## 2022-10-22 ENCOUNTER — Other Ambulatory Visit (INDEPENDENT_AMBULATORY_CARE_PROVIDER_SITE_OTHER): Payer: Medicare Other

## 2022-10-22 DIAGNOSIS — J84112 Idiopathic pulmonary fibrosis: Secondary | ICD-10-CM

## 2022-10-22 DIAGNOSIS — Z5181 Encounter for therapeutic drug level monitoring: Secondary | ICD-10-CM

## 2022-10-22 LAB — COMPREHENSIVE METABOLIC PANEL
ALT: 25 U/L (ref 0–53)
AST: 34 U/L (ref 0–37)
Albumin: 4.1 g/dL (ref 3.5–5.2)
Alkaline Phosphatase: 49 U/L (ref 39–117)
BUN: 18 mg/dL (ref 6–23)
CO2: 30 mEq/L (ref 19–32)
Calcium: 9.8 mg/dL (ref 8.4–10.5)
Chloride: 100 mEq/L (ref 96–112)
Creatinine, Ser: 1.06 mg/dL (ref 0.40–1.50)
GFR: 67.66 mL/min (ref >60.00)
Glucose, Bld: 140 mg/dL — ABNORMAL HIGH (ref 70–99)
Potassium: 3.9 mEq/L (ref 3.5–5.1)
Sodium: 137 mEq/L (ref 135–145)
Total Bilirubin: 0.6 mg/dL (ref 0.2–1.2)
Total Protein: 7.4 g/dL (ref 6.0–8.3)

## 2022-10-22 LAB — HEPATIC FUNCTION PANEL
ALT: 25 U/L (ref 0–53)
AST: 34 U/L (ref 0–37)
Albumin: 4.1 g/dL (ref 3.5–5.2)
Alkaline Phosphatase: 49 U/L (ref 39–117)
Bilirubin, Direct: 0.1 mg/dL (ref 0.0–0.3)
Total Bilirubin: 0.6 mg/dL (ref 0.2–1.2)
Total Protein: 7.4 g/dL (ref 6.0–8.3)

## 2022-10-29 DIAGNOSIS — H2513 Age-related nuclear cataract, bilateral: Secondary | ICD-10-CM | POA: Diagnosis not present

## 2022-10-29 DIAGNOSIS — H524 Presbyopia: Secondary | ICD-10-CM | POA: Diagnosis not present

## 2022-11-08 ENCOUNTER — Telehealth: Payer: Self-pay | Admitting: Pulmonary Disease

## 2022-11-08 MED ORDER — PIRFENIDONE 267 MG PO TABS: 534.0000 mg | ORAL_TABLET | Freq: Three times a day (TID) | ORAL | 1 refills | Status: DC

## 2022-11-08 NOTE — Telephone Encounter (Signed)
Pt. Calling to get Pirfenidone (ESBRIET) 267 MG TABS  change from 30 day to 90 day supply

## 2022-11-08 NOTE — Telephone Encounter (Signed)
Patient is wanting Esbriet '267mg'$  tabs sent in as a 90 day supply and not 30 days  Please advise

## 2022-11-08 NOTE — Telephone Encounter (Signed)
Refill for Esbriet sent to [facility]. LFTs from 10/22/22 normal  Dose: '534mg'$  three times daily (low dose as maintenance)     Latest Ref Rng & Units 10/22/2022   12:25 PM 03/04/2022    2:36 PM 01/04/2022    3:16 PM  Hepatic Function  Total Protein 6.0 - 8.3 g/dL 6.0 - 8.3 g/dL 7.4    7.4  6.8  7.3   Albumin 3.5 - 5.2 g/dL 3.5 - 5.2 g/dL 4.1    4.1  4.2  4.4   AST 0 - 37 U/L 0 - 37 U/L 34    34  21  25   ALT 0 - 53 U/L 0 - 53 U/L '25    25  15  17   '$ Alk Phosphatase 39 - 117 U/L 39 - 117 U/L 49    49  51  52   Total Bilirubin 0.2 - 1.2 mg/dL 0.2 - 1.2 mg/dL 0.6    0.6  0.7  0.6   Bilirubin, Direct 0.0 - 0.3 mg/dL 0.1       Knox Saliva, PharmD, MPH, BCPS, CPP Clinical Pharmacist (Rheumatology and Pulmonology)

## 2022-11-09 NOTE — Telephone Encounter (Signed)
Per chart review, Esbriet was sent in yesterday for 90 day supply.  Knox Saliva, PharmD, MPH, BCPS, CPP Clinical Pharmacist (Rheumatology and Pulmonology)

## 2022-11-30 ENCOUNTER — Encounter: Payer: Self-pay | Admitting: Family Medicine

## 2022-11-30 ENCOUNTER — Ambulatory Visit (INDEPENDENT_AMBULATORY_CARE_PROVIDER_SITE_OTHER): Payer: Medicare Other | Admitting: Family Medicine

## 2022-11-30 VITALS — BP 105/76 | HR 64 | Ht 67.0 in | Wt 166.0 lb

## 2022-11-30 DIAGNOSIS — R053 Chronic cough: Secondary | ICD-10-CM

## 2022-11-30 DIAGNOSIS — R0982 Postnasal drip: Secondary | ICD-10-CM | POA: Diagnosis not present

## 2022-11-30 DIAGNOSIS — R0989 Other specified symptoms and signs involving the circulatory and respiratory systems: Secondary | ICD-10-CM | POA: Diagnosis not present

## 2022-11-30 MED ORDER — PANTOPRAZOLE SODIUM 40 MG PO TBEC: 40.0000 mg | DELAYED_RELEASE_TABLET | Freq: Every day | ORAL | 1 refills | Status: DC

## 2022-11-30 MED ORDER — ATROVENT HFA 17 MCG/ACT IN AERS: 2.0000 | INHALATION_SPRAY | Freq: Two times a day (BID) | RESPIRATORY_TRACT | 1 refills | Status: DC

## 2022-11-30 NOTE — Progress Notes (Signed)
Acute Office Visit  Subjective:     Patient ID: Todd Parsons, male    DOB: 02-21-1946, 77 y.o.   MRN: [id number]  Chief Complaint  Patient presents with   Allergies    HPI Patient is in today for cough, sinus drainage for at least 3 to 4 months.  He says he is constantly clearing his throat throughout the day.  No throat pain or ear pain or pressure.  He does take Zyrtec daily.  And he uses a diluted Afrin as needed.  For the last 2 to 3 weeks he is also been having some headaches in his temples which is new.  He denies any heartburn or reflux symptoms. . No fever or chills.      ROS      Objective:    BP 105/76   Pulse 64   Ht 5\' 7"  (1.702 m)   Wt 166 lb (75.3 kg)   SpO2 94%   BMI 26.00 kg/m    Physical Exam Constitutional:      Appearance: He is well-developed.  HENT:     Head: Normocephalic and atraumatic.     Right Ear: Tympanic membrane, ear canal and external ear normal.     Left Ear: Ear canal and external ear normal.     Ears:     Comments: Left TM blocked by cerumen.    Nose: Nose normal.     Mouth/Throat:     Pharynx: Oropharynx is clear.  Eyes:     Conjunctiva/sclera: Conjunctivae normal.     Pupils: Pupils are equal, round, and reactive to light.  Neck:     Thyroid: No thyromegaly.  Cardiovascular:     Rate and Rhythm: Normal rate.     Heart sounds: Normal heart sounds.  Pulmonary:     Effort: Pulmonary effort is normal.     Breath sounds: Normal breath sounds.  Musculoskeletal:     Cervical back: Neck supple.  Lymphadenopathy:     Cervical: No cervical adenopathy.  Skin:    General: Skin is warm and dry.  Neurological:     Mental Status: He is alert and oriented to person, place, and time.     No results found for any visits on 11/30/22.      Assessment & Plan:   Problem List Items Addressed This Visit       Other   Chronic cough - Primary    We discussed differential including postnasal drip and GERD.  Will treat for both.  I  will have him clear his nose with nasal saline and then use Atrovent nasal spray.  Will also treat for potential GERD even though he is not having any active symptoms that he is aware of.  Will start pantoprazole.  I will see him back in 3 to 4 weeks to see how he is doing and we will make changes from there.  There certainly could be an allergic component so encouraged him to continue with the Zyrtec for now.  That are ENT/asthma allergy referral if him not improving.      Relevant Medications   pantoprazole (PROTONIX) 40 MG tablet   ipratropium (ATROVENT HFA) 17 MCG/ACT inhaler   Other Visit Diagnoses     Post-nasal drip       Relevant Medications   pantoprazole (PROTONIX) 40 MG tablet   ipratropium (ATROVENT HFA) 17 MCG/ACT inhaler   Throat clearing       Relevant Medications   pantoprazole (PROTONIX) 40  MG tablet   ipratropium (ATROVENT HFA) 17 MCG/ACT inhaler       Urged him to also schedule follow-up in 1 month to follow-up on his diabetes he is overdue for his care gaps in regards to diabetes.  Meds ordered this encounter  Medications   pantoprazole (PROTONIX) 40 MG tablet    Sig: Take 1 tablet (40 mg total) by mouth daily. About 30 min before breakfast    Dispense:  30 tablet    Refill:  1   ipratropium (ATROVENT HFA) 17 MCG/ACT inhaler    Sig: Inhale 2 puffs into the lungs in the morning and at bedtime.    Dispense:  1 each    Refill:  1    Return in about 4 weeks (around 12/28/2022) for Diabetes follow-up and check on cough/throat clearing. Beatrice Lecher, MD

## 2022-11-30 NOTE — Assessment & Plan Note (Signed)
We discussed differential including postnasal drip and GERD.  Will treat for both.  I will have him clear his nose with nasal saline and then use Atrovent nasal spray.  Will also treat for potential GERD even though he is not having any active symptoms that he is aware of.  Will start pantoprazole.  I will see him back in 3 to 4 weeks to see how he is doing and we will make changes from there.  There certainly could be an allergic component so encouraged him to continue with the Zyrtec for now.  That are ENT/asthma allergy referral if him not improving.

## 2022-12-01 ENCOUNTER — Other Ambulatory Visit: Payer: Self-pay | Admitting: *Deleted

## 2022-12-01 DIAGNOSIS — R0989 Other specified symptoms and signs involving the circulatory and respiratory systems: Secondary | ICD-10-CM

## 2022-12-01 DIAGNOSIS — R053 Chronic cough: Secondary | ICD-10-CM

## 2022-12-01 DIAGNOSIS — R0982 Postnasal drip: Secondary | ICD-10-CM

## 2022-12-01 MED ORDER — ATROVENT HFA 17 MCG/ACT IN AERS: 2.0000 | INHALATION_SPRAY | Freq: Two times a day (BID) | RESPIRATORY_TRACT | 1 refills | Status: DC

## 2022-12-01 MED ORDER — PANTOPRAZOLE SODIUM 40 MG PO TBEC: 40.0000 mg | DELAYED_RELEASE_TABLET | Freq: Every day | ORAL | 1 refills | Status: DC

## 2022-12-02 MED ORDER — IPRATROPIUM BROMIDE 0.06 % NA SOLN: 2.0000 | Freq: Three times a day (TID) | NASAL | 2 refills | Status: DC

## 2022-12-02 NOTE — Addendum Note (Signed)
Addended by: Beatrice Lecher D on: 12/02/2022 11:09 AM   Modules accepted: Orders

## 2022-12-02 NOTE — Progress Notes (Signed)
Meds ordered this encounter  Medications   DISCONTD: pantoprazole (PROTONIX) 40 MG tablet    Sig: Take 1 tablet (40 mg total) by mouth daily. About 30 min before breakfast    Dispense:  30 tablet    Refill:  1   DISCONTD: ipratropium (ATROVENT HFA) 17 MCG/ACT inhaler    Sig: Inhale 2 puffs into the lungs in the morning and at bedtime.    Dispense:  1 each    Refill:  1   ipratropium (ATROVENT) 0.06 % nasal spray    Sig: Place 2 sprays into both nostrils 3 (three) times daily.    Dispense:  15 mL    Refill:  2

## 2022-12-14 ENCOUNTER — Ambulatory Visit (INDEPENDENT_AMBULATORY_CARE_PROVIDER_SITE_OTHER): Payer: Medicare Other | Admitting: Family Medicine

## 2022-12-14 DIAGNOSIS — H6123 Impacted cerumen, bilateral: Secondary | ICD-10-CM

## 2022-12-14 NOTE — Progress Notes (Signed)
   Established Patient Office Visit  Subjective   Patient ID: Todd Parsons, male    DOB: 07/13/1946  Age: 77 y.o. MRN: [id number]  Chief Complaint  Patient presents with   Cerumen Impaction    HPI  Todd Parsons is here for ear wax removal.   ROS    Objective:     There were no vitals taken for this visit.   Physical Exam   No results found for any visits on 12/14/22.    The 10-year ASCVD risk score (Arnett DK, et al., 2019) is: 39.3%    Assessment & Plan:  Bilateral impacted cerumen - Cerumen removed from both ears without complications.   Problem List Items Addressed This Visit   None Visit Diagnoses     Bilateral impacted cerumen    -  Primary       Return if symptoms worsen or fail to improve.    Esmond Harps, CMA

## 2022-12-14 NOTE — Progress Notes (Signed)
Indication: Cerumen impaction of the ear(s) Medical necessity statement: On physical examination, cerumen impairs clinically significant portions of the external auditory canal, and tympanic membrane. Noted obstructive, copious cerumen that cannot be removed without magnification  Consent: Discussed benefits and risks of procedure and verbal consent obtained Procedure: Patient was prepped for the procedure. Utilized an otoscope to assess and take note of the ear canal, the tympanic membrane, and the presence, amount, and placement of the cerumen. Gentle water irrigation and soft plastic curette was utilized to remove cerumen.  Post procedure examination: shows cerumen was completely removed. Patient tolerated procedure well. The patient is made aware that they may experience temporary vertigo, temporary hearing loss, and temporary discomfort. If these symptom last for more than 24 hours to call the clinic or proceed to the ED.   Agree with documentation as above.   Nani Gasser, MD

## 2022-12-30 ENCOUNTER — Encounter: Payer: Self-pay | Admitting: Family Medicine

## 2022-12-30 ENCOUNTER — Ambulatory Visit (INDEPENDENT_AMBULATORY_CARE_PROVIDER_SITE_OTHER): Payer: Medicare Other | Admitting: Family Medicine

## 2022-12-30 VITALS — BP 99/61 | HR 61 | Ht 67.0 in | Wt 164.0 lb

## 2022-12-30 DIAGNOSIS — Z7984 Long term (current) use of oral hypoglycemic drugs: Secondary | ICD-10-CM | POA: Diagnosis not present

## 2022-12-30 DIAGNOSIS — R634 Abnormal weight loss: Secondary | ICD-10-CM

## 2022-12-30 DIAGNOSIS — E119 Type 2 diabetes mellitus without complications: Secondary | ICD-10-CM

## 2022-12-30 DIAGNOSIS — J84112 Idiopathic pulmonary fibrosis: Secondary | ICD-10-CM | POA: Diagnosis not present

## 2022-12-30 DIAGNOSIS — E1159 Type 2 diabetes mellitus with other circulatory complications: Secondary | ICD-10-CM

## 2022-12-30 DIAGNOSIS — I10 Essential (primary) hypertension: Secondary | ICD-10-CM

## 2022-12-30 LAB — POCT GLYCOSYLATED HEMOGLOBIN (HGB A1C): Hemoglobin A1C: 5.7 % — AB (ref 4.0–5.6)

## 2022-12-30 NOTE — Assessment & Plan Note (Signed)
He continues with the Esbriet.  He says he did return his oxygen and so is not using it.  I did encourage him to reach out to his pulmonologist and let them know what is going on.  He does feel like the Esbriet really affects his hunger and affects how foods taste.  Organ to continue to monitor his weight.  I think he is at a great weight right now though he does have some atrophy of the muscles and we discussed specifically doing some strengthening exercises and resistance training to improve muscle strength and tone.  But if he starts to drop into the 150s with his weight then we could consider medication to assist with appetite if needed.

## 2022-12-30 NOTE — Progress Notes (Signed)
Throat clearing has gotten some better. His wife wonders if he should increase the Protonix to twice daily.   Dr. Verita Schneiders optometrist  Ph:  [phone]

## 2022-12-30 NOTE — Assessment & Plan Note (Signed)
He really feels like the weight loss is secondary to his medication treatment for his pulmonary fibrosis he notices a huge difference he feels like it really suppresses his appetite and takes away how food tastes to him so he is continuing to lose weight in fact he is down another 2 pounds.  His wife is very concerned and his family is very concerned.  We did discuss an option of medications that can help increase appetite but I only would want to initiate that if he is dropping below 160.  Will continue to monitor carefully.

## 2022-12-30 NOTE — Assessment & Plan Note (Signed)
A1c looks good today at 5.7.  Continue current regimen.

## 2022-12-30 NOTE — Progress Notes (Signed)
Established Patient Office Visit  Subjective   Patient ID: Todd Parsons, male    DOB: 1946-08-18  Age: 77 y.o. MRN: [id number]  Chief Complaint  Patient presents with   Diabetes    HPI  Diabetes - no hypoglycemic events. No wounds or sores that are not healing well. No increased thirst or urination. Checking glucose at home. Taking medications as prescribed without any side effects.  Exam was done at [facility] will call for report.  Currently on metformin.  Did a consultation with our clinical pharmacist back in January for his diabetes.  He continues to lose weight he is down to 164 pounds.  He is down about 2 pounds from when he was here last time.  His wife did let me know that he return to all of his oxygen.    ROS    Objective:     BP 99/61   Pulse 61   Ht  (1.702 m)   Wt 164 lb (74.4 kg)   SpO2 95%   BMI 25.69 kg/m    Physical Exam Constitutional:      Appearance: He is well-developed.  HENT:     Head: Normocephalic and atraumatic.  Cardiovascular:     Rate and Rhythm: Normal rate and regular rhythm.     Heart sounds: Normal heart sounds.  Pulmonary:     Effort: Pulmonary effort is normal.     Breath sounds: Wheezing present.     Comments: Diffuse coarse breath sounds with a few crackles.  Some expiratory wheeze in the left upper and lower lung. Skin:    General: Skin is warm and dry.  Neurological:     Mental Status: He is alert and oriented to person, place, and time.  Psychiatric:        Behavior: Behavior normal.      Results for orders placed or performed in visit on 12/30/22  POCT glycosylated hemoglobin (Hb A1C)  Result Value Ref Range   Hemoglobin A1C 5.7 (A) 4.0 - 5.6 %   HbA1c POC (<> result, manual entry)     HbA1c, POC (prediabetic range)     HbA1c, POC (controlled diabetic range)        The 10-year ASCVD risk score (Arnett DK, et al., 2019) is: 36.2%    Assessment & Plan:   Problem List Items Addressed This Visit        Cardiovascular and Mediastinum   Type 2 diabetes mellitus with cardiac complication    A1c looks good today at 5.7.  Continue current regimen.      Essential hypertension, benign   Relevant Orders   Urine Microalbumin w/creat. ratio   TSH     Respiratory   IPF (idiopathic pulmonary fibrosis)    He continues with the Esbriet.  He says he did return his oxygen and so is not using it.  I did encourage him to reach out to his pulmonologist and let them know what is going on.  He does feel like the Esbriet really affects his hunger and affects how foods taste.  Organ to continue to monitor his weight.  I think he is at a great weight right now though he does have some atrophy of the muscles and we discussed specifically doing some strengthening exercises and resistance training to improve muscle strength and tone.  But if he starts to drop into the 150s with his weight then we could consider medication to assist with appetite if needed.  Other   Abnormal weight loss    He really feels like the weight loss is secondary to his medication treatment for his pulmonary fibrosis he notices a huge difference he feels like it really suppresses his appetite and takes away how food tastes to him so he is continuing to lose weight in fact he is down another 2 pounds.  His wife is very concerned and his family is very concerned.  We did discuss an option of medications that can help increase appetite but I only would want to initiate that if he is dropping below 160.  Will continue to monitor carefully.      Relevant Orders   Urine Microalbumin w/creat. ratio   TSH   Other Visit Diagnoses     Type 2 diabetes mellitus without complication, without long-term current use of insulin    -  Primary   Relevant Orders   POCT glycosylated hemoglobin (Hb A1C) (Completed)   Urine Microalbumin w/creat. ratio   TSH       Return in about 4 months (around 05/01/2023) for Diabetes follow-up.     Nani Gasser, MD

## 2022-12-31 LAB — MICROALBUMIN / CREATININE URINE RATIO
Creatinine, Urine: 138 mg/dL (ref 20–320)
Microalb Creat Ratio: 36 mg/g creat — ABNORMAL HIGH (ref <30)
Microalb, Ur: 5 mg/dL

## 2022-12-31 LAB — TSH: TSH: 2.49 mIU/L (ref 0.40–4.50)

## 2022-12-31 NOTE — Progress Notes (Signed)
BJ, there is just a slight excess of protein in the urine.  Organ to keep an eye on that.  Thyroid looks great.

## 2023-01-12 DIAGNOSIS — Z952 Presence of prosthetic heart valve: Secondary | ICD-10-CM | POA: Diagnosis not present

## 2023-01-12 DIAGNOSIS — Z9889 Other specified postprocedural states: Secondary | ICD-10-CM | POA: Diagnosis not present

## 2023-01-12 DIAGNOSIS — I10 Essential (primary) hypertension: Secondary | ICD-10-CM | POA: Diagnosis not present

## 2023-01-12 DIAGNOSIS — I25810 Atherosclerosis of coronary artery bypass graft(s) without angina pectoris: Secondary | ICD-10-CM | POA: Diagnosis not present

## 2023-01-19 ENCOUNTER — Other Ambulatory Visit: Payer: Self-pay | Admitting: Family Medicine

## 2023-01-19 DIAGNOSIS — R0989 Other specified symptoms and signs involving the circulatory and respiratory systems: Secondary | ICD-10-CM

## 2023-01-19 DIAGNOSIS — R0982 Postnasal drip: Secondary | ICD-10-CM

## 2023-01-19 DIAGNOSIS — R053 Chronic cough: Secondary | ICD-10-CM

## 2023-02-02 ENCOUNTER — Other Ambulatory Visit: Payer: Self-pay | Admitting: Family Medicine

## 2023-02-02 DIAGNOSIS — N401 Enlarged prostate with lower urinary tract symptoms: Secondary | ICD-10-CM

## 2023-02-07 ENCOUNTER — Other Ambulatory Visit: Payer: Self-pay | Admitting: Family Medicine

## 2023-02-09 ENCOUNTER — Telehealth: Payer: Self-pay | Admitting: Pulmonary Disease

## 2023-02-09 ENCOUNTER — Telehealth: Payer: Self-pay | Admitting: Pharmacist

## 2023-02-09 DIAGNOSIS — J84112 Idiopathic pulmonary fibrosis: Secondary | ICD-10-CM

## 2023-02-09 MED ORDER — PIRFENIDONE 267 MG PO TABS
534.0000 mg | ORAL_TABLET | Freq: Three times a day (TID) | ORAL | 0 refills | Status: DC
Start: 2023-02-09 — End: 2023-05-11

## 2023-02-09 NOTE — Telephone Encounter (Signed)
Refill sent for ESBRIET to [facility] ([facility]) for Esbriet: [phone]  Dose: 534 mg three times daily  Last OV: 07/24/23 Provider: Dr. Isaiah Serge  Next OV: overdue (was due in April 2024)  LFTS 10/22/22 wnl  Chesley Mires, PharmD, MPH, BCPS Clinical Pharmacist (Rheumatology and Pulmonology)

## 2023-02-09 NOTE — Telephone Encounter (Signed)
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 States he has not been wear oxygen and is fine walking up 1 flight of stairs, but needs to sit after 2 flights. He works outside but at a slower pace. Patient has not tried these things with his oxygen is see if it helps. He is not a fan of the canal in his nose. Please advise. Patient is scheduled for 7/9 at 1:15pm

## 2023-02-09 NOTE — Telephone Encounter (Signed)
Patient is scheduled 7/9 with Dr. Isaiah Serge.

## 2023-02-10 NOTE — Telephone Encounter (Signed)
Spoke with patient. States he stopped using oxygen months ago. He states that with some exertion he does pretty good, but he goes up multiple steps he gets slightly short of breath. He doesn't want a big concentrator. States its hard to do work outside and trying to carry a big tank around. He is requesting a poc machine just to use with a lot of activity.  Dr. Isaiah Serge please advise? Okay to place order?

## 2023-02-10 NOTE — Telephone Encounter (Signed)
Okay to place order for POC oxygen machine.  He will probably need to be qualified to be able to get it through insurance.

## 2023-02-11 NOTE — Telephone Encounter (Signed)
Left message for patient to call back  

## 2023-02-14 ENCOUNTER — Telehealth: Payer: Self-pay | Admitting: Family Medicine

## 2023-02-14 MED ORDER — AMLODIPINE BESYLATE 10 MG PO TABS: 10.0000 mg | ORAL_TABLET | Freq: Every day | ORAL | 3 refills | Status: DC

## 2023-02-14 NOTE — Telephone Encounter (Signed)
Sent!

## 2023-02-14 NOTE — Telephone Encounter (Signed)
Pt called to let us know he is taking Amlodipine 10mg . Pharmacy: [facility]

## 2023-03-05 ENCOUNTER — Other Ambulatory Visit: Payer: Self-pay | Admitting: Family Medicine

## 2023-03-05 DIAGNOSIS — N401 Enlarged prostate with lower urinary tract symptoms: Secondary | ICD-10-CM

## 2023-03-15 ENCOUNTER — Ambulatory Visit: Payer: Medicare Other | Admitting: Pulmonary Disease

## 2023-03-15 ENCOUNTER — Encounter: Payer: Self-pay | Admitting: Pulmonary Disease

## 2023-03-15 VITALS — BP 124/62 | HR 64 | Temp 97.7°F | Ht 67.0 in | Wt 163.2 lb

## 2023-03-15 DIAGNOSIS — J84112 Idiopathic pulmonary fibrosis: Secondary | ICD-10-CM

## 2023-03-15 DIAGNOSIS — R0602 Shortness of breath: Secondary | ICD-10-CM | POA: Diagnosis not present

## 2023-03-15 DIAGNOSIS — Z5181 Encounter for therapeutic drug level monitoring: Secondary | ICD-10-CM

## 2023-03-15 LAB — COMPREHENSIVE METABOLIC PANEL
ALT: 15 U/L (ref 0–53)
AST: 25 U/L (ref 0–37)
Albumin: 4.4 g/dL (ref 3.5–5.2)
Alkaline Phosphatase: 63 U/L (ref 39–117)
BUN: 18 mg/dL (ref 6–23)
CO2: 31 mEq/L (ref 19–32)
Calcium: 10.3 mg/dL (ref 8.4–10.5)
Chloride: 100 mEq/L (ref 96–112)
Creatinine, Ser: 1.32 mg/dL (ref 0.40–1.50)
GFR: 51.86 mL/min — ABNORMAL LOW (ref >60.00)
Glucose, Bld: 106 mg/dL — ABNORMAL HIGH (ref 70–99)
Potassium: 5.1 mEq/L (ref 3.5–5.1)
Sodium: 137 mEq/L (ref 135–145)
Total Bilirubin: 0.7 mg/dL (ref 0.2–1.2)
Total Protein: 7.4 g/dL (ref 6.0–8.3)

## 2023-03-15 NOTE — Addendum Note (Signed)
Addended by: Jacquiline Doe on: 03/15/2023 02:22 PM   Modules accepted: Orders

## 2023-03-15 NOTE — Patient Instructions (Addendum)
Will check you to see if you qualify for portable concentrator You can try increasing your Esbriet dose to 3 tablets 3 times daily Will check comprehensive metabolic panel and proBNP for dyspnea today Order high-res CT and PFTs in 6 months Return to clinic in 6 months

## 2023-03-15 NOTE — Addendum Note (Signed)
Addended by: Jacquiline Doe on: 03/15/2023 01:53 PM   Modules accepted: Orders

## 2023-03-15 NOTE — Progress Notes (Signed)
Todd Parsons    [id number]    June 17, 1946  Primary Care Physician:Metheney, Barbarann Ehlers, MD  Referring Physician: Agapito Games, MD [address] [place],  [place] [address]  Chief complaint:  Follow-up for ILD Started Esbriet November 2022 Esbriet dose lowered in June 2023  HPI: 77 y.o.  ex-smoker with history of a flutter, aortic valve replacement, aortic aneurysm repair, hyperlipidemia, diabetes  Referred for evaluation of pulmonary fibrosis He was previously seen in 2018 by Dr. Vassie Loll in the pulmonary clinic when he had a CT scan which showed pulmonary fibrosis.  He has been lost to follow-up since then. Complains of chronic dyspnea on exertion which has been stable over several years.  No symptoms at rest.  Denies any cough, sputum production, fevers, chills  Started Esbriet in November 2022 He had to reduce the dose of Esbriet since he was having issues with fatigue, loss of taste and weight loss.  Esbriet held and resumed at a lower dose around June 2023.  He is currently taking 2 tablets 3 times a day with improvement in side effects symptoms.  Finished cardiopulmonary rehab in early 2023  Pets: Has a dog Occupation: Retired Nurse, learning disability for [employer] Exposures: No known exposures.  No mold, hot tub, Jacuzzi.  No feather pillows or comforter ILD questionnaire 06/17/2021-negative for exposures. Smoking history: 17-pack-year smoker.  Quit in 1979 Travel history: Originally from [place].  No significant recent travel Relevant family history: No family history of lung disease  Interim history: States that breathing is stable with no issues.  Still using 2 tablets of Esbriet 3 times daily  Outpatient Encounter Medications as of 03/15/2023  Medication Sig   amLODipine (NORVASC) 10 MG tablet Take 1 tablet (10 mg total) by mouth daily.   aspirin EC 81 MG tablet Take 81 mg by mouth daily.   atorvastatin (LIPITOR) 40 MG tablet TAKE 1  TABLET BY MOUTH AT  BEDTIME   cetirizine (ZYRTEC) 10 MG tablet Take 10 mg by mouth as needed for allergies.   fenofibrate 160 MG tablet TAKE 1 TABLET BY MOUTH  DAILY   finasteride (PROSCAR) 5 MG tablet Take 1 tablet (5 mg total) by mouth daily.   FLUoxetine (PROZAC) 20 MG capsule Take 2 capsules (40 mg total) by mouth daily.   fluticasone (FLONASE) 50 MCG/ACT nasal spray Place 2 sprays into both nostrils daily as needed for allergies or rhinitis.   metFORMIN (GLUCOPHAGE-XR) 500 MG 24 hr tablet Take 1 tablet (500 mg total) by mouth daily with breakfast.   metoprolol succinate (TOPROL-XL) 50 MG 24 hr tablet Take 1 tablet (50 mg total) by mouth daily. Take with or immediately following a meal.   pantoprazole (PROTONIX) 40 MG tablet Take 1 tablet (40 mg total) by mouth daily. About 30 min before breakfast   Pirfenidone (ESBRIET) 267 MG TABS Take 2 tablets (534 mg total) by mouth with breakfast, with lunch, and with evening meal. **low dose as maintenance**   tamsulosin (FLOMAX) 0.4 MG CAPS capsule Take 1 capsule (0.4 mg total) by mouth 2 (two) times daily. (Patient taking differently: Take 0.4 mg by mouth daily.)   [DISCONTINUED] ipratropium (ATROVENT HFA) 17 MCG/ACT inhaler Inhale 2 puffs into the lungs in the morning and at bedtime.   No facility-administered encounter medications on file as of 03/15/2023.    Allergies as of 03/15/2023   (No Known Allergies)    Physical Exam: Blood pressure 124/62, pulse  64, temperature 97.7 F (36.5 C), temperature source Oral, height 5\' 7"  (1.702 m), weight 163 lb 3.2 oz (74 kg), SpO2 93 %. Gen:      No acute distress HEENT:  EOMI, sclera anicteric Neck:     No masses; no thyromegaly Lungs:   Bibasal crackles CV:         Regular rate and rhythm; no murmurs Abd:      + bowel sounds; soft, non-tender; no palpable masses, no distension Ext:    No edema; adequate peripheral perfusion Skin:      Warm and dry; no rash Neuro: alert and oriented x 3 Psych:  normal mood and affect   Data Reviewed: Imaging: High-res CT 07/22/2017-pulmonary fibrosis, probable UIP High-res CT 09/09/2020-slight worsening of pulmonary fibrosis and probable UIP pattern High-res CT 05/19/2022-progression of pulmonary fibrosis High-res CT 09/09/2020-minimal worsening of pulmonary fibrosis. I have reviewed the images personally.  PFTs: 08/24/2017 FVC 2.02 [50%], FEV1 1.89 [69%], F/F 94, TLC 3.62 [56%], DLCO 15.57 [5%] Severe restriction and diffusion defect  05/14/2022 FVC 1.54 [43%], FEV1 1.44 [57%], F/F 94, TLC 2.88 [46%], DLCO 13.85 [63%] Severe restriction, moderate diffusion defect  Labs: CTD, hypersensitivity serologies 08/24/2017-negative ANA, CCP, negative hypersensitivity panel  Labs from primary care BNP 07/14/2022-120.5 CBC 07/14/2022-within normal limits Basic metabolic panel 07/14/2022-normal.  Assessment:  Pulmonary fibrosis He had CT scans on record which shows pulmonary fibrosis slightly progressive.  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  There may be a hint of honeycombing at the base as well by my review.  Note CT reports from even before from the [place] that shows reticulation so the process appears to be progressive  Although the initial scan was read as hypersensitivity pneumonitis he does not have any known exposures or signs and symptoms of connective tissue disease.  CTD serologies are negative  He likely has IPF based on above work-up PFTs reviewed with worsening restriction We also reviewed his CT scan from earlier here with minimal progression He continues on Esbriet at a lower dose and will try to go back up to higher doses.  We discussed alternate treatment with Ofev but he would like to continue Esbriet for now and reassess in 6 months Follow-up CT, PFTs in 6 months  Check hepatic panel, proBNP for monitoring.  Cough, postnasal drip He will try over-the-counter antihistamine such as Benadryl, chlorpheniramine and steroid nasal spray  Nasonex  Plan/Recommendations: Continue Esbriet Check hepatic panel, proBNP High-res CT, PFTs Qualify for portable concentrator  Chilton Greathouse MD [facility] 03/15/2023, 1:25 PM  CC: Agapito Games, *

## 2023-03-16 LAB — PRO B NATRIURETIC PEPTIDE: NT-Pro BNP: 278 pg/mL (ref 0–486)

## 2023-03-25 DIAGNOSIS — J449 Chronic obstructive pulmonary disease, unspecified: Secondary | ICD-10-CM | POA: Diagnosis not present

## 2023-03-28 ENCOUNTER — Other Ambulatory Visit: Payer: Self-pay | Admitting: Family Medicine

## 2023-03-28 DIAGNOSIS — N401 Enlarged prostate with lower urinary tract symptoms: Secondary | ICD-10-CM

## 2023-03-30 ENCOUNTER — Other Ambulatory Visit: Payer: Self-pay | Admitting: Family Medicine

## 2023-03-30 DIAGNOSIS — N401 Enlarged prostate with lower urinary tract symptoms: Secondary | ICD-10-CM

## 2023-04-25 DIAGNOSIS — J449 Chronic obstructive pulmonary disease, unspecified: Secondary | ICD-10-CM | POA: Diagnosis not present

## 2023-05-02 ENCOUNTER — Encounter: Payer: Self-pay | Admitting: Family Medicine

## 2023-05-02 ENCOUNTER — Ambulatory Visit (INDEPENDENT_AMBULATORY_CARE_PROVIDER_SITE_OTHER): Payer: Medicare Other | Admitting: Family Medicine

## 2023-05-02 VITALS — BP 104/73 | HR 57 | Ht 67.0 in | Wt 164.0 lb

## 2023-05-02 DIAGNOSIS — R0989 Other specified symptoms and signs involving the circulatory and respiratory systems: Secondary | ICD-10-CM | POA: Diagnosis not present

## 2023-05-02 DIAGNOSIS — I712 Thoracic aortic aneurysm, without rupture, unspecified: Secondary | ICD-10-CM

## 2023-05-02 DIAGNOSIS — R413 Other amnesia: Secondary | ICD-10-CM | POA: Diagnosis not present

## 2023-05-02 DIAGNOSIS — E1159 Type 2 diabetes mellitus with other circulatory complications: Secondary | ICD-10-CM

## 2023-05-02 DIAGNOSIS — J84112 Idiopathic pulmonary fibrosis: Secondary | ICD-10-CM

## 2023-05-02 DIAGNOSIS — I10 Essential (primary) hypertension: Secondary | ICD-10-CM

## 2023-05-02 DIAGNOSIS — Z7984 Long term (current) use of oral hypoglycemic drugs: Secondary | ICD-10-CM

## 2023-05-02 DIAGNOSIS — Z23 Encounter for immunization: Secondary | ICD-10-CM

## 2023-05-02 LAB — POCT GLYCOSYLATED HEMOGLOBIN (HGB A1C): Hemoglobin A1C: 5.5 % (ref 4.0–5.6)

## 2023-05-02 MED ORDER — AMLODIPINE BESYLATE 5 MG PO TABS: 5.0000 mg | ORAL_TABLET | Freq: Every day | ORAL | 2 refills | Status: DC

## 2023-05-02 NOTE — Assessment & Plan Note (Signed)
Schedule for high res CT in Jan.

## 2023-05-02 NOTE — Assessment & Plan Note (Addendum)
BP low. Plan to dec BP pill.  Follow up in  6 mo . Amlodipine to 5mg .

## 2023-05-02 NOTE — Assessment & Plan Note (Signed)
Offered to refer him to ENT, but he declines.

## 2023-05-02 NOTE — Progress Notes (Signed)
Established Patient Office Visit  Subjective   Patient ID: Todd Parsons, male    DOB: [birth year]  Age: 77 y.o. MRN: [id number]  Chief Complaint  Patient presents with   Diabetes    HPI  Diabetes - no hypoglycemic events. No wounds or sores that are not healing well. No increased thirst or urination. Checking glucose at home. Taking medications as prescribed without any side effects.  Hypertension- Pt denies chest pain, SOB, dizziness, or heart palpitations.  Taking meds as directed w/o problems.  Denies medication side effects.    His wife has concerns about memory impairment. Says one day he asked if her if his grandchildren were his nieces.     Still has constant throat clearing. He feels the PPI has helped. Feels like it is mostly drainage now and feels the Zyrtec helps clear the  mucous    ROS    Objective:     BP 104/73   Pulse (!) 57   Ht 5\' 7"  (1.702 m)   Wt 164 lb (74.4 kg)   SpO2 95%   BMI 25.69 kg/m    Physical Exam Vitals and nursing note reviewed.  Constitutional:      Appearance: Normal appearance.  HENT:     Head: Normocephalic and atraumatic.  Eyes:     Conjunctiva/sclera: Conjunctivae normal.  Cardiovascular:     Rate and Rhythm: Normal rate and regular rhythm.  Pulmonary:     Effort: Pulmonary effort is normal.     Breath sounds: Normal breath sounds.     Comments: Crackles at the bases bilaterally  Skin:    General: Skin is warm and dry.  Neurological:     Mental Status: He is alert.  Psychiatric:        Mood and Affect: Mood normal.      Results for orders placed or performed in visit on 05/02/23  POCT HgB A1C  Result Value Ref Range   Hemoglobin A1C 5.5 4.0 - 5.6 %   HbA1c POC (<> result, manual entry)     HbA1c, POC (prediabetic range)     HbA1c, POC (controlled diabetic range)        The 10-year ASCVD risk score (Arnett DK, et al., 2019) is: 41.1%    Assessment & Plan:   Problem List Items Addressed This Visit        Cardiovascular and Mediastinum   Type 2 diabetes mellitus with cardiac complication (HCC)    A1C looks good though up a little from last time. Continue to work on [employer] and exercise.       Relevant Medications   amLODipine (NORVASC) 5 MG tablet   Other Relevant Orders   POCT HgB A1C (Completed)   CBC   Lipid panel   Thoracic aortic aneurysm without rupture (HCC)    Schedule for high res CT in Jan.       Relevant Medications   amLODipine (NORVASC) 5 MG tablet   Essential hypertension, benign - Primary    BP low. Plan to dec BP pill.  Follow up in  6 mo . Amlodipine to 5mg .        Relevant Medications   amLODipine (NORVASC) 5 MG tablet   Other Relevant Orders   CBC   Lipid panel     Respiratory   IPF (idiopathic pulmonary fibrosis) (HCC)    He feels like his breathing is what really limits his activities the most.  No recent exacerbations.  [facility]  Continue Esbriet        Other   Memory changes   Chronic throat clearing    Offered to refer him to ENT, but he declines.        Other Visit Diagnoses     Encounter for immunization       Relevant Orders   Flu Vaccine Trivalent High Dose (Fluad) (Completed)      Can schedule him for further memory testing at next OV>    Return in about 4 months (around 09/01/2023) for Diabetes follow-up, Hypertension.    Nani Gasser, MD

## 2023-05-02 NOTE — Assessment & Plan Note (Signed)
A1C looks good though up a little from last time. Continue to work on [employer] and exercise.

## 2023-05-02 NOTE — Assessment & Plan Note (Addendum)
He feels like his breathing is what really limits his activities the most.  No recent exacerbations.  Follows at the [place].  Continue Esbriet

## 2023-05-03 ENCOUNTER — Telehealth: Payer: Self-pay | Admitting: Family Medicine

## 2023-05-03 LAB — LIPID PANEL
Chol/HDL Ratio: 3.3 ratio (ref 0.0–5.0)
Cholesterol, Total: 138 mg/dL (ref 100–199)
HDL: 42 mg/dL (ref >39)
LDL Chol Calc (NIH): 76 mg/dL (ref 0–99)
Triglycerides: 109 mg/dL (ref 0–149)
VLDL Cholesterol Cal: 20 mg/dL (ref 5–40)

## 2023-05-03 LAB — CBC
Hematocrit: 44.6 % (ref 37.5–51.0)
Hemoglobin: 14.7 g/dL (ref 13.0–17.7)
MCH: 29.2 pg (ref 26.6–33.0)
MCHC: 33 g/dL (ref 31.5–35.7)
MCV: 89 fL (ref 79–97)
Platelets: 257 10*3/uL (ref 150–450)
RBC: 5.04 x10E6/uL (ref 4.14–5.80)
RDW: 12.9 % (ref 11.6–15.4)
WBC: 9.7 10*3/uL (ref 3.4–10.8)

## 2023-05-03 NOTE — Telephone Encounter (Signed)
Patient is requesting a refill on metformin 500 mg  Pharmacy [facility] Delivery [place] Phone [phone]

## 2023-05-03 NOTE — Progress Notes (Signed)
Hi Todd Parsons, blood count looks great.  Close is up a little bit.  We really want that LDL less than 70.  Last time we checked it it was 61 this time it was up to 76.  Actually you are taking your Lipitor every night in addition to continuing to work on healthy diet and regular exercise.

## 2023-05-04 ENCOUNTER — Other Ambulatory Visit: Payer: Self-pay | Admitting: *Deleted

## 2023-05-04 DIAGNOSIS — E119 Type 2 diabetes mellitus without complications: Secondary | ICD-10-CM

## 2023-05-04 MED ORDER — METFORMIN HCL ER 500 MG PO TB24
500.0000 mg | ORAL_TABLET | Freq: Every day | ORAL | 3 refills | Status: DC
Start: 2023-05-04 — End: 2023-12-07

## 2023-05-04 NOTE — Telephone Encounter (Signed)
Medication sent to mail order

## 2023-05-05 ENCOUNTER — Telehealth: Payer: Self-pay | Admitting: *Deleted

## 2023-05-05 MED ORDER — FLUOXETINE HCL 40 MG PO CAPS: 40.0000 mg | ORAL_CAPSULE | Freq: Every day | ORAL | 1 refills | Status: DC

## 2023-05-05 NOTE — Telephone Encounter (Signed)
While speaking with pt about his results he and his wife informed me that he is taking a total of 40 mg of the Fluoxetine daily. He asked that I send in a new prescription to [facility] for the 40 mg tablet for him to start taking.   I did advised that this strength comes in a capsule the tablet is not on his formulary. They voiced understanding and agreed.

## 2023-05-11 ENCOUNTER — Other Ambulatory Visit: Payer: Self-pay | Admitting: Pharmacist

## 2023-05-11 DIAGNOSIS — J84112 Idiopathic pulmonary fibrosis: Secondary | ICD-10-CM

## 2023-05-11 MED ORDER — PIRFENIDONE 267 MG PO TABS
534.0000 mg | ORAL_TABLET | Freq: Three times a day (TID) | ORAL | 1 refills | Status: DC
Start: 2023-05-11 — End: 2023-12-19

## 2023-05-11 NOTE — Telephone Encounter (Signed)
Refill sent for ESBRIET to [facility] for Esbriet: [phone]  Dose: 534mg  three times daily  Last OV: 03/15/23 Provider: Dr. Isaiah Serge Pertinent labs: LFTs 03/15/23 wnl  Next OV: 6 month (Jan 2025)  Routing to scheduling team for follow-up on appt scheduling  Chesley Mires, PharmD, MPH, BCPS Clinical Pharmacist (Rheumatology and Pulmonology)

## 2023-05-30 ENCOUNTER — Telehealth: Payer: Self-pay | Admitting: Family Medicine

## 2023-05-30 NOTE — Telephone Encounter (Signed)
Pt called. He is requesting a refill of Fenofibrate, Metoprolol 25mg  and not 50mg . It looks like we have not refilled these meds in awhile?

## 2023-06-01 ENCOUNTER — Telehealth: Payer: Self-pay | Admitting: Family Medicine

## 2023-06-01 DIAGNOSIS — E785 Hyperlipidemia, unspecified: Secondary | ICD-10-CM

## 2023-06-01 MED ORDER — METOPROLOL SUCCINATE ER 50 MG PO TB24: 50.0000 mg | ORAL_TABLET | Freq: Every day | ORAL | 1 refills | Status: DC

## 2023-06-01 MED ORDER — FENOFIBRATE 160 MG PO TABS
160.0000 mg | ORAL_TABLET | Freq: Every day | ORAL | 3 refills | Status: DC
Start: 2023-06-01 — End: 2024-01-27

## 2023-06-01 NOTE — Telephone Encounter (Signed)
Medications sent to mail order pharmacy listed.

## 2023-06-01 NOTE — Telephone Encounter (Signed)
Prescription Request  06/01/2023  LOV: 05/02/2023  What is the name of the medication or equipment? metoprolol succinate (TOPROL-XL) 25 MG 24 hr tablet  and fenofibrate 160 MG tablet. Pt is requesting 90 day supply  Have you contacted your pharmacy to request a refill? Yes   Which pharmacy would you like this sent to?   [facility] Delivery) [place], [place] - [address] [facility] [address] [place] [address] [place]  [address] Phone: [phone] Fax: [phone]    Patient notified that their request is being sent to the clinical staff for review and that they should receive a response within 2 business days.   Please advise at [facility] [phone]

## 2023-06-01 NOTE — Telephone Encounter (Signed)
Medications have been sent

## 2023-06-03 ENCOUNTER — Other Ambulatory Visit: Payer: Self-pay | Admitting: Family Medicine

## 2023-06-03 DIAGNOSIS — N401 Enlarged prostate with lower urinary tract symptoms: Secondary | ICD-10-CM

## 2023-06-03 DIAGNOSIS — R0982 Postnasal drip: Secondary | ICD-10-CM

## 2023-06-03 DIAGNOSIS — R0989 Other specified symptoms and signs involving the circulatory and respiratory systems: Secondary | ICD-10-CM

## 2023-06-03 DIAGNOSIS — R053 Chronic cough: Secondary | ICD-10-CM

## 2023-06-03 MED ORDER — PANTOPRAZOLE SODIUM 40 MG PO TBEC
40.0000 mg | DELAYED_RELEASE_TABLET | Freq: Every day | ORAL | 3 refills | Status: DC
Start: 2023-06-03 — End: 2024-01-27

## 2023-06-03 MED ORDER — FINASTERIDE 5 MG PO TABS
5.0000 mg | ORAL_TABLET | Freq: Every day | ORAL | 3 refills | Status: DC
Start: 2023-06-03 — End: 2024-01-27

## 2023-06-03 NOTE — Progress Notes (Signed)
Meds ordered this encounter  Medications   finasteride (PROSCAR) 5 MG tablet    Sig: Take 1 tablet (5 mg total) by mouth daily.    Dispense:  90 tablet    Refill:  3    Patient only has a week left of medication   pantoprazole (PROTONIX) 40 MG tablet    Sig: Take 1 tablet (40 mg total) by mouth daily. About 30 min before breakfast    Dispense:  90 tablet    Refill:  3    This prescription was filled on 11/30/2022. Any refills authorized will be placed on file.

## 2023-06-03 NOTE — Telephone Encounter (Signed)
-----   Message from Nani Gasser sent at 06/03/2023  7:40 AM EDT ----- Regarding: Refills HI Morrie Sheldon, in our office all med refills go through our CMA's first and they refill as appropriate. Is there a way to have these sent to the [facility] or refill pool first before going to the provider?  Thank you! ----- Message ----- From: Vallery Sa, CMA Sent: 06/02/2023   8:32 AM EDT To: Agapito Games, MD

## 2023-06-03 NOTE — Telephone Encounter (Signed)
This refill was sent to [facility] in error and I was routing the request to avoid delay in the refill. I only had your name on the Rx. My apologies.

## 2023-06-06 ENCOUNTER — Telehealth: Payer: Self-pay | Admitting: Family Medicine

## 2023-06-06 NOTE — Telephone Encounter (Signed)
Prescription Request  06/06/2023  LOV: 05/02/2023  What is the name of the medication or equipment? Metoprolol succinate 25mg  tablets, a 90 day supply   Have you contacted your pharmacy to request a refill? Yes   Which pharmacy would you like this sent to?    [facility] Delivery) [place], [place] - [address] [facility] [address] [place] [address] [place] [address] Phone: [phone] Fax: [phone]    Patient notified that their request is being sent to the clinical staff for review and that they should receive a response within 2 business days.   Please advise at Mobile [phone] (mobile)

## 2023-06-07 NOTE — Telephone Encounter (Signed)
Medication was sent to mail order

## 2023-06-09 ENCOUNTER — Telehealth: Payer: Self-pay | Admitting: Family Medicine

## 2023-06-09 NOTE — Telephone Encounter (Signed)
Patients wife called wanting to discuss a referral for allergy shots due to continued and worsening symptoms such as congestion, cough, etc. Please advise.  Phone number - [phone]

## 2023-06-17 ENCOUNTER — Other Ambulatory Visit: Payer: Self-pay | Admitting: *Deleted

## 2023-06-17 ENCOUNTER — Telehealth: Payer: Self-pay | Admitting: Family Medicine

## 2023-06-17 DIAGNOSIS — I10 Essential (primary) hypertension: Secondary | ICD-10-CM

## 2023-06-17 DIAGNOSIS — E785 Hyperlipidemia, unspecified: Secondary | ICD-10-CM

## 2023-06-17 MED ORDER — ATORVASTATIN CALCIUM 40 MG PO TABS: 40.0000 mg | ORAL_TABLET | Freq: Every day | ORAL | 3 refills | Status: DC

## 2023-06-17 MED ORDER — METOPROLOL SUCCINATE ER 25 MG PO TB24: 25.0000 mg | ORAL_TABLET | Freq: Every day | ORAL | 3 refills | Status: DC

## 2023-06-17 NOTE — Telephone Encounter (Signed)
Patient called. He following up on his Metoprolol being changed to 25mg . [facility] is still wanting to fill it for 50 mg. He also requesting a refill on his Atorvastatin.

## 2023-06-17 NOTE — Telephone Encounter (Signed)
Metoprolol 25 and Atorvastatin sent to mail order for patient.

## 2023-07-26 DIAGNOSIS — R0600 Dyspnea, unspecified: Secondary | ICD-10-CM | POA: Diagnosis not present

## 2023-07-26 DIAGNOSIS — I10 Essential (primary) hypertension: Secondary | ICD-10-CM | POA: Diagnosis not present

## 2023-07-26 DIAGNOSIS — I25721 Atherosclerosis of autologous artery coronary artery bypass graft(s) with angina pectoris with documented spasm: Secondary | ICD-10-CM | POA: Diagnosis not present

## 2023-07-26 DIAGNOSIS — Z952 Presence of prosthetic heart valve: Secondary | ICD-10-CM | POA: Diagnosis not present

## 2023-07-26 DIAGNOSIS — Z9889 Other specified postprocedural states: Secondary | ICD-10-CM | POA: Diagnosis not present

## 2023-08-15 ENCOUNTER — Encounter: Payer: Self-pay | Admitting: Family Medicine

## 2023-08-15 ENCOUNTER — Ambulatory Visit (INDEPENDENT_AMBULATORY_CARE_PROVIDER_SITE_OTHER): Payer: Medicare Other | Admitting: Family Medicine

## 2023-08-15 VITALS — Ht 67.0 in | Wt 160.0 lb

## 2023-08-15 DIAGNOSIS — Z0000 Encounter for general adult medical examination without abnormal findings: Secondary | ICD-10-CM | POA: Diagnosis not present

## 2023-08-15 NOTE — Progress Notes (Signed)
Subjective:   Todd Parsons is a 77 y.o. male who presents for Medicare Annual/Subsequent preventive examination.  Visit Complete: Virtual I connected with  Shahil Granado Payer on 08/15/23 by a audio enabled telemedicine application and verified that I am speaking with the correct person using two identifiers.  Patient Location: Home  Provider Location: Office/Clinic  I discussed the limitations of evaluation and management by telemedicine. The patient expressed understanding and agreed to proceed.  Vital Signs: Because this visit was a virtual/telehealth visit, some criteria may be missing or patient reported. Any vitals not documented were not able to be obtained and vitals that have been documented are patient reported.  Patient Medicare AWV questionnaire was completed by the patient on 08/15/23; I have confirmed that all information answered by patient is correct and no changes since this date.  Cardiac Risk Factors include: advanced age (>16men, >33 women);male gender;hypertension;dyslipidemia     Objective:    Today's Vitals   08/15/23 1310  Weight: 160 lb (72.6 kg)  Height: 5\' 7"  (1.702 m)   Body mass index is 25.06 kg/m.     08/15/2023    1:25 PM 08/09/2022    1:14 PM 09/21/2021   10:37 AM 05/23/2021    2:05 PM 10/02/2018   10:18 AM 04/06/2017    8:04 AM 02/18/2017   11:36 AM  Advanced Directives  Does Patient Have a Medical Advance Directive? Yes Yes Yes Yes No Yes No  Type of Estate agent of [place];Living will Living will Healthcare Power of [place];Living will Healthcare Power of [employer] of [place];Living will   Does patient want to make changes to medical advance directive? No - Patient declined No - Patient declined No - Patient declined      Copy of Healthcare Power of Attorney in Chart?   Yes - validated most recent copy scanned in chart (See row information) No - copy requested  No - copy requested   Would patient like  information on creating a medical advance directive?     Yes (MAU/Ambulatory/Procedural Areas - Information given)  Yes (MAU/Ambulatory/Procedural Areas - Information given)    Current Medications (verified) Outpatient Encounter Medications as of 08/15/2023  Medication Sig   amLODipine (NORVASC) 5 MG tablet Take 1 tablet (5 mg total) by mouth daily.   aspirin EC 81 MG tablet Take 81 mg by mouth daily.   atorvastatin (LIPITOR) 40 MG tablet Take 1 tablet (40 mg total) by mouth at bedtime.   cetirizine (ZYRTEC) 10 MG tablet Take 10 mg by mouth as needed for allergies.   fenofibrate 160 MG tablet Take 1 tablet (160 mg total) by mouth daily.   finasteride (PROSCAR) 5 MG tablet Take 1 tablet (5 mg total) by mouth daily.   FLUoxetine (PROZAC) 40 MG capsule Take 1 capsule (40 mg total) by mouth daily.   fluticasone (FLONASE) 50 MCG/ACT nasal spray Place 2 sprays into both nostrils daily as needed for allergies or rhinitis.   metFORMIN (GLUCOPHAGE-XR) 500 MG 24 hr tablet Take 1 tablet (500 mg total) by mouth daily with breakfast.   metoprolol succinate (TOPROL-XL) 25 MG 24 hr tablet Take 1 tablet (25 mg total) by mouth daily. Take with or immediately following a meal.   Pirfenidone (ESBRIET) 267 MG TABS Take 2 tablets (534 mg total) by mouth with breakfast, with lunch, and with evening meal. **low dose as maintenance**   tamsulosin (FLOMAX) 0.4 MG CAPS capsule Take 1 capsule (0.4 mg total) by  mouth 2 (two) times daily. (Patient taking differently: Take 0.4 mg by mouth daily.)   pantoprazole (PROTONIX) 40 MG tablet Take 1 tablet (40 mg total) by mouth daily. About 30 min before breakfast (Patient not taking: Reported on 08/15/2023)   No facility-administered encounter medications on file as of 08/15/2023.    Allergies (verified) Patient has no known allergies.   History: Past Medical History:  Diagnosis Date   Arthritis    osteoarthritis   Bicuspid aortic valve    Cataract    Coronary artery  disease    Dysrhythmia    atrial flutter   Echocardiogram with ECG monitoring 01/12/2010   60-65% EF   Hyperlipidemia    Hypertension    Pre-diabetes    Past Surgical History:  Procedure Laterality Date   A-FLUTTER ABLATION  2017   ACROMIO-CLAVICULAR JOINT REPAIR Right 1980s       apnea device implant     then removed.     CARDIAC CATHETERIZATION  07/2015   CARDIAC VALVE REPLACEMENT  08/05/2015   Aortic valve   COLONOSCOPY     CORONARY ARTERY BYPASS GRAFT  08/05/2015   left knee arthoscopy     NOSE SURGERY  late 50's   to fix a broken nose   TONSILECTOMY, ADENOIDECTOMY, BILATERAL MYRINGOTOMY AND TUBES     TOTAL KNEE ARTHROPLASTY Right 02/28/2017   Procedure: RIGHT TOTAL KNEE ARTHROPLASTY;  Surgeon: Jodi Geralds, MD;  Location: [facility] OR;  Service: Orthopedics;  Laterality: Right;   Family History  Problem Relation Age of Onset   Stroke Mother 62   Cancer Mother        upper palate   Heart disease Father 14       bypass surgery   Colon cancer Neg Hx    Colon polyps Neg Hx    Esophageal cancer Neg Hx    Rectal cancer Neg Hx    Stomach cancer Neg Hx    Social History   Socioeconomic History   Marital status: Married    Spouse name:  Meriam Sprague   Number of children: 3   Years of education: 18   Highest education level: Master's degree (e.g., MA, MS, MEng, MEd, MSW, MBA)  Occupational History   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 Enjoys dancing.   Social Determinants of Health   Financial Resource Strain: Low Risk  (08/15/2023)   Overall Financial Resource Strain (CARDIA)    Difficulty of Paying Living Expenses: Not hard at all  Food Insecurity: No Food Insecurity (08/15/2023)   Hunger Vital Sign    Worried About Running Out of Food in the Last Year: Never true    Ran Out of Food in the Last Year: Never true  Transportation Needs: No Transportation Needs (08/15/2023)   PRAPARE - Transportation  Lack of Transportation (Medical): No    Lack of Transportation (Non-Medical): No  Physical Activity: Insufficiently Active (08/15/2023)   Exercise Vital Sign    Days of Exercise per Week: 3 days    Minutes of Exercise per Session: 30 min  Stress: No Stress Concern Present (08/15/2023)   [employer] of Occupational Health - Occupational Stress Questionnaire    Feeling of Stress : Only a little  Social Connections: Moderately Integrated (08/15/2023)   Social Connection and Isolation Panel [NHANES]    Frequency of Communication with Friends and Family: Twice a week    Frequency of Social Gatherings with Friends and Family: Once a week    Attends Religious Services: 1 to 4 times per year    Active Member of [employer] or Organizations: No    Attends Engineer, structural: Never    Marital Status: Married    Tobacco Counseling Counseling given: Not Answered   Clinical Intake:  Pre-visit preparation completed: No  Pain : No/denies pain     BMI - recorded: 25 Nutritional Status: BMI 25 -29 Overweight Nutritional Risks: None Diabetes: No  How often do you need to have someone help you when you read instructions, pamphlets, or other written materials from your doctor or pharmacy?: 1 - Never What is the last grade level you completed in school?: 18.5  Interpreter Needed?: No      Activities of Daily Living    08/15/2023    1:12 PM  In your present state of health, do you have any difficulty  performing the following activities:  Hearing? 0  Comment has hearling loss, wears hearing aids, hears well with aids.  Vision? 0  Difficulty concentrating or making decisions? 0  Walking or climbing stairs? 1  Comment pulmonary fibrous, unable to climb more that one flight of stairs  Dressing or bathing? 0  Doing errands, shopping? 0  Preparing Food and eating ? N  Using the Toilet? N  In the past six months, have you accidently leaked urine? Y  Do you have problems with loss of bowel control? N  Managing your Medications? N  Managing your Finances? N  Housekeeping or managing your Housekeeping? N    Patient Care Team: Agapito Games, MD as PCP - General (Family Medicine) Baltazar Apo,  MD as Attending Physician (Cardiology) Gabriel Carina, [facility] (Inactive) as Pharmacist (Pharmacist) Mannam, Dr. Pul[facility] [place] [facility].   Indicate any recent Medical Services you may have received from other than [facility] providers in the past year (date may be approximate).     Assessment:   This is a routine wellness examination for Lynn.  Hearing/Vision screen Hearing Screening - Comments:: Unable to test, grossly intact. Vision Screening - Comments:: Unable to test, wears glasses, sees well with reading glasses.    Goals Addressed             This Visit's Progress    Exercise 3x per week (30 min per time)        Depression Screen    08/15/2023    1:23 PM 05/02/2023   11:39 AM 08/09/2022    1:17 PM 11/26/2021    9:30 AM 09/21/2021   10:58 AM 05/23/2021    2:02 PM 12/25/2020   12:49 PM  PHQ 2/9 Scores  PHQ - 2 Score 0 0 0 0 0 0 0  PHQ- 9 Score  1  0 0      Fall Risk  08/15/2023    1:27 PM 12/30/2022   11:42 AM 08/09/2022    1:16 PM 09/21/2021   10:55 AM 05/23/2021    2:06 PM  Fall Risk   Falls in the past year? 0 0 0 0 0  Number falls in past yr: 0 0 0 0 0  Injury with Fall? 0 0 0 0 0  Risk for fall due to : No Fall Risks No Fall Risks No  Fall Risks Impaired vision Impaired vision  Risk for fall due to: Comment    wears glasses   Follow up  Falls evaluation completed Falls evaluation completed Falls prevention discussed;Education provided Falls prevention discussed    MEDICARE RISK AT HOME: Medicare Risk at Home Any stairs in or around the home?: Yes If so, are there any without handrails?: Yes Home free of loose throw rugs in walkways, pet beds, electrical cords, etc?: Yes Adequate lighting in your home to reduce risk of falls?: Yes Life alert?: No Use of a cane, walker or w/c?: No Grab bars in the bathroom?: Yes Shower chair or bench in shower?: No Elevated toilet seat or a handicapped toilet?: No  TIMED UP AND GO:  Was the test performed?  No    Cognitive Function:    11/23/2021    3:47 PM 01/22/2021   12:37 PM  MMSE - Mini Mental State Exam  Orientation to time 5 5  Orientation to Place 5 5  Registration 3 3  Attention/ Calculation 5 5  Recall 3 3  Language- name 2 objects 2 2  Language- repeat 1 1  Language- follow 3 step command 3 3  Language- read & follow direction 1 1  Write a sentence 1 1  Copy design 1 1  Total score 30 30        08/15/2023    1:29 PM 08/09/2022    1:23 PM 05/23/2021    2:10 PM 10/02/2018   10:26 AM  6CIT Screen  What Year? 0 points 0 points 0 points 0 points  What month? 0 points 0 points 0 points 0 points  What time? 0 points 0 points 0 points 0 points  Count back from 20 0 points 0 points 0 points 0 points  Months in reverse 0 points 0 points 0 points 0 points  Repeat phrase 0 points 0 points 0 points 0 points  Total Score 0 points 0 points 0 points 0 points    Immunizations Immunization History  Administered Date(s) Administered   Fluad Quad(high Dose 65+) 05/23/2019, 06/20/2020, 07/07/2021   Fluad Trivalent(High Dose 65+) 05/02/2023   Influenza Split 06/04/2008, 07/22/2010, 05/19/2012   Influenza Whole 05/05/2011   Influenza, High Dose Seasonal PF 05/30/2017,  06/13/2018, 06/18/2022   Influenza,inj,Quad PF,6+ Mos 07/27/2013, 07/08/2014, 06/21/2015, 06/02/2016   Influenza-Unspecified 05/19/2012, 07/06/2015, 06/11/2022   Moderna Covid-19 Fall Seasonal Vaccine 69yrs & older 09/02/2022   PFIZER(Purple Top)SARS-COV-2 Vaccination 11/04/2019, 11/28/2019, 07/27/2020, 12/09/2020   Pfizer Covid-19 Vaccine Bivalent Booster 26yrs & up 06/09/2021, 02/09/2022   Pneumococcal Conjugate-13 07/24/2014   Pneumococcal Polysaccharide-23 07/12/2010   Rsv, Bivalent, Protein Subunit Rsvpref,pf Verdis Frederickson) 09/02/2022   Td 10/18/1997   Td (Adult),5 Lf Tetanus Toxid, Preservative Free 05/06/2022   Tdap 08/19/2011   Zoster Recombinant(Shingrix) 06/18/2022   Zoster, Live 10/03/2006, 06/11/2008, 07/30/2014    TDAP status: Up to date  Flu Vaccine status: Up to date  Pneumococcal vaccine status: Up to date  Covid-19 vaccine status: Completed vaccines  Qualifies for Shingles Vaccine? Yes   Zostavax  completed Yes   Shingrix Completed?: Yes  Screening Tests Health Maintenance  Topic Date Due   Zoster Vaccines- Shingrix (2 of 2) 08/13/2022   OPHTHALMOLOGY EXAM  10/08/2022   COVID-19 Vaccine (8 - 2023-24 season) 05/08/2023   HEMOGLOBIN A1C  11/02/2023   Diabetic kidney evaluation - Urine ACR  12/30/2023   FOOT EXAM  12/30/2023   Diabetic kidney evaluation - eGFR measurement  03/14/2024   Medicare Annual Wellness (AWV)  08/14/2024   Colonoscopy  07/17/2026   DTaP/Tdap/Td (4 - Td or Tdap) 05/06/2032   Pneumonia Vaccine 3+ Years old  Completed   INFLUENZA VACCINE  Completed   Hepatitis C Screening  Completed   HPV VACCINES  Aged Out    Health Maintenance  Health Maintenance Due  Topic Date Due   Zoster Vaccines- Shingrix (2 of 2) 08/13/2022   OPHTHALMOLOGY EXAM  10/08/2022   COVID-19 Vaccine (8 - 2023-24 season) 05/08/2023    Colorectal cancer screening: No longer required.   Lung Cancer Screening: (Low Dose CT Chest recommended if Age 33-80 years, 20  pack-year currently smoking OR have quit w/in 15years.) does not qualify.   Lung Cancer Screening Referral: quit smoking since 1979   Additional Screening:  Hepatitis C Screening: does qualify; Completed 01/30/2015  Vision Screening: Recommended annual ophthalmology exams for early detection of glaucoma and other disorders of the eye. Is the patient up to date with their annual eye exam?  Yes  Who is the provider or what is the name of the office in which the patient attends annual eye exams? [facility] in [place] If pt is not established with a provider, would they like to be referred to a provider to establish care? No .   Dental Screening: Recommended annual dental exams for proper oral hygiene  Diabetic Foot Exam: Diabetic Foot Exam: Completed 12/30/22  Community Resource Referral / Chronic Care Management: CRR required this visit?  No   CCM required this visit?  No     Plan:     I have personally reviewed and noted the following in the patient's chart:   Medical and social history Use of alcohol, tobacco or illicit drugs  Current medications and supplements including opioid prescriptions. Patient is not currently taking opioid prescriptions. Functional ability and status Nutritional status Physical activity Advanced directives List of other physicians Hospitalizations, surgeries, and ER visits in previous 12 months Vitals Screenings to include cognitive, depression, and falls Referrals and appointments  In addition, I have reviewed and discussed with patient certain preventive protocols, quality metrics, and best practice recommendations. A written personalized care plan for preventive services as well as general preventive health recommendations were provided to patient.     Novella Olive, FNP   08/15/2023   After Visit Summary: (MyChart) Due to this being a telephonic visit, the after visit summary with patients personalized plan was offered to patient via MyChart    Follow-up with PCP as scheduled. Getting eye exam at [facility] in January. Reports being up to date with Shingrix vaccine

## 2023-08-17 ENCOUNTER — Other Ambulatory Visit: Payer: Self-pay | Admitting: *Deleted

## 2023-08-17 DIAGNOSIS — N401 Enlarged prostate with lower urinary tract symptoms: Secondary | ICD-10-CM

## 2023-08-17 MED ORDER — TAMSULOSIN HCL 0.4 MG PO CAPS: 0.4000 mg | ORAL_CAPSULE | Freq: Two times a day (BID) | ORAL | 3 refills | Status: AC

## 2023-08-31 ENCOUNTER — Other Ambulatory Visit: Payer: Self-pay | Admitting: Family Medicine

## 2023-09-05 ENCOUNTER — Ambulatory Visit (INDEPENDENT_AMBULATORY_CARE_PROVIDER_SITE_OTHER): Payer: Medicare Other | Admitting: Family Medicine

## 2023-09-05 ENCOUNTER — Encounter: Payer: Self-pay | Admitting: Family Medicine

## 2023-09-05 VITALS — BP 91/56 | HR 66 | Ht 67.0 in | Wt 156.0 lb

## 2023-09-05 DIAGNOSIS — Z7984 Long term (current) use of oral hypoglycemic drugs: Secondary | ICD-10-CM

## 2023-09-05 DIAGNOSIS — R413 Other amnesia: Secondary | ICD-10-CM

## 2023-09-05 DIAGNOSIS — L570 Actinic keratosis: Secondary | ICD-10-CM

## 2023-09-05 DIAGNOSIS — N401 Enlarged prostate with lower urinary tract symptoms: Secondary | ICD-10-CM | POA: Diagnosis not present

## 2023-09-05 DIAGNOSIS — R3915 Urgency of urination: Secondary | ICD-10-CM

## 2023-09-05 DIAGNOSIS — Z974 Presence of external hearing-aid: Secondary | ICD-10-CM | POA: Diagnosis not present

## 2023-09-05 DIAGNOSIS — I959 Hypotension, unspecified: Secondary | ICD-10-CM

## 2023-09-05 DIAGNOSIS — E1159 Type 2 diabetes mellitus with other circulatory complications: Secondary | ICD-10-CM | POA: Diagnosis not present

## 2023-09-05 DIAGNOSIS — R634 Abnormal weight loss: Secondary | ICD-10-CM | POA: Diagnosis not present

## 2023-09-05 DIAGNOSIS — I10 Essential (primary) hypertension: Secondary | ICD-10-CM

## 2023-09-05 LAB — POCT GLYCOSYLATED HEMOGLOBIN (HGB A1C): Hemoglobin A1C: 5.7 % — AB (ref 4.0–5.6)

## 2023-09-05 MED ORDER — FLUOROURACIL 0.5 % EX CREA: TOPICAL_CREAM | Freq: Every day | CUTANEOUS | 0 refills | Status: DC

## 2023-09-05 NOTE — Assessment & Plan Note (Signed)
Blood pressure is a little too low today.  Will hold amlodipine for now.  Please see note below.

## 2023-09-05 NOTE — Assessment & Plan Note (Signed)
Filed Weights   09/05/23 1120  Weight: 156 lb (70.8 kg)   Down another 4 lbs. we did discuss getting in a Ensure or boost daily to see if that at least stabilizes his weight and if not again can consider medication options to help.   He really feels like the weight loss is secondary to his medication treatment for his pulmonary fibrosis he notices a huge difference he feels like it really suppresses his appetite and takes away how food tastes to him so he is continuing to lose weight in fact he is down another 2 pounds.  His wife is very concerned and his family is very concerned.  We did discuss an option of medications that can help increase appetite but I only would want to initiate that if he is dropping below 160.  Will continue to monitor carefully.

## 2023-09-05 NOTE — Assessment & Plan Note (Signed)
She has got a lot more comfortable wearing his hearing aids so has been wearing the more consistently.  Though he does read subtitles on the television.

## 2023-09-05 NOTE — Assessment & Plan Note (Signed)
Continue with Flomax daily.

## 2023-09-05 NOTE — Assessment & Plan Note (Signed)
A1c looks good today at 5.7.  Continue current regimen of low blood sugars.

## 2023-09-05 NOTE — Progress Notes (Signed)
Established Patient Office Visit  Subjective  Patient ID: Todd Parsons, male    DOB: 01/02/1946  Age: 77 y.o. MRN: [id number]  Chief Complaint  Patient presents with   Diabetes   Hypertension    HPI  Hypertension- Pt denies chest pain, SOB, dizziness, or heart palpitations.  Taking meds as directed w/o problems.  Denies medication side effects.  He so says that he just feels tired a lot he has no energy he has been taking a couple of naps he thinks part of it is because he feels cold.  Diabetes - no hypoglycemic events. No wounds or sores that are not healing well. No increased thirst or urination. Checking glucose at home. Taking medications as prescribed without any side effects.  He is planning on starting some chair exercises.  Hilda Lias impairment-his wife notices that he will forget that he is already watched a series of shows and she will have to remind him.  She feels like his short-term memory has gotten a little worse recently.  She is also concerned about his continued weight loss.  He is down to 156 pounds.  He says that his Esbriet  really reduces his appetite.  And he is already taking a lower dose.  He just feels up very quickly and does not want to eat a lot.  Has a skin lesion on his back and on his right forehead that he would like me to look at today.   Wife is also very worried about weight loss.    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  Continue current regimen of low blood sugars.      Relevant Orders   POCT HgB A1C (Completed)   CBC with Differential/Platelet   TSH   CMP14+EGFR   Essential hypertension, benign   Blood pressure is a little too low today.  Will hold amlodipine for now.  Please see note below.      Relevant Orders   CBC with Differential/Platelet   TSH   CMP14+EGFR     Other   Wears hearing aid   She has got a lot more comfortable wearing his hearing aids so has been wearing the more consistently.  Though he does read subtitles on the television.      Memory changes   Benign prostatic hyperplasia (BPH) with urinary urgency   Continue with Flomax daily.      Abnormal weight loss   Filed Weights   09/05/23 1120  Weight: 156 lb (70.8 kg)   Down another 4 lbs. we did discuss getting in a Ensure or  boost daily to see if that at least stabilizes his weight and if not again can consider medication options to help.   He really feels like the weight loss is secondary to his medication treatment for his pulmonary fibrosis he notices a huge difference he feels like it really suppresses his appetite and takes away how food tastes to him so he is continuing to lose weight in fact he is down another 2 pounds.  His wife is very concerned and his family is very concerned.  We did discuss an option of medications that can help increase appetite but I only would want to initiate that if he is dropping below 160.  Will continue to monitor carefully.        Other Visit Diagnoses       Hypotension, unspecified hypotension type       Relevant Orders   CBC with Differential/Platelet   TSH   CMP14+EGFR     AK (actinic keratosis)       Relevant Medications   fluoruracil (CARAC) 0.5 % cream       Hypotension-blood pressure is actually quite low today but he is asymptomatic he is not  feeling dizzy or lightheaded.  Discussed that it can cause fatigue and might even tribute into him wanting to sleep more during the daytime.  So we discussed holding the amlodipine for now and seeing over the next 2 weeks if he actually feels a little better and if his blood pressures coming up asked him to track blood pressures at home as well.  He just saw cardiology about a month ago and blood pressure was 108 at that time.  Can lesion on his right forehead looks most consistent with an actinic keratosis.  Will do a topical treatment.  Fatigue-will check additional lab work just to rule out other causes but again I really think it is coming from the low blood pressure.  Return in about 2 weeks (around 09/19/2023) for Nurse BP Check.   I spent 45 minutes on the day of the encounter to include pre-visit record review, face-to-face time with the patient and post visit ordering of test.   Nani Gasser, MD

## 2023-09-05 NOTE — Patient Instructions (Signed)
Hold Amlodipine and recheck BP in 2 week.  Try to check BP at home a couple of times this week and next week and bring those in with you when you come in for a nurse visit.

## 2023-09-06 ENCOUNTER — Telehealth: Payer: Self-pay

## 2023-09-06 DIAGNOSIS — L570 Actinic keratosis: Secondary | ICD-10-CM

## 2023-09-06 LAB — CMP14+EGFR
ALT: 23 [IU]/L (ref 0–44)
AST: 28 [IU]/L (ref 0–40)
Albumin: 4.1 g/dL (ref 3.8–4.8)
Alkaline Phosphatase: 74 [IU]/L (ref 44–121)
BUN/Creatinine Ratio: 15 (ref 10–24)
BUN: 15 mg/dL (ref 8–27)
Bilirubin Total: 0.4 mg/dL (ref 0.0–1.2)
CO2: 27 mmol/L (ref 20–29)
Calcium: 9.7 mg/dL (ref 8.6–10.2)
Chloride: 102 mmol/L (ref 96–106)
Creatinine, Ser: 1.02 mg/dL (ref 0.76–1.27)
Globulin, Total: 2.2 g/dL (ref 1.5–4.5)
Glucose: 152 mg/dL — ABNORMAL HIGH (ref 70–99)
Potassium: 4.3 mmol/L (ref 3.5–5.2)
Sodium: 140 mmol/L (ref 134–144)
Total Protein: 6.3 g/dL (ref 6.0–8.5)
eGFR: 75 mL/min/{1.73_m2} (ref >59)

## 2023-09-06 LAB — CBC WITH DIFFERENTIAL/PLATELET
Basophils Absolute: 0.1 10*3/uL (ref 0.0–0.2)
Basos: 1 %
EOS (ABSOLUTE): 0.4 10*3/uL (ref 0.0–0.4)
Eos: 4 %
Hematocrit: 44.8 % (ref 37.5–51.0)
Hemoglobin: 14.6 g/dL (ref 13.0–17.7)
Immature Grans (Abs): 0 10*3/uL (ref 0.0–0.1)
Immature Granulocytes: 0 %
Lymphocytes Absolute: 1.9 10*3/uL (ref 0.7–3.1)
Lymphs: 22 %
MCH: 29.6 pg (ref 26.6–33.0)
MCHC: 32.6 g/dL (ref 31.5–35.7)
MCV: 91 fL (ref 79–97)
Monocytes Absolute: 1 10*3/uL — ABNORMAL HIGH (ref 0.1–0.9)
Monocytes: 12 %
Neutrophils Absolute: 5.3 10*3/uL (ref 1.4–7.0)
Neutrophils: 61 %
Platelets: 265 10*3/uL (ref 150–450)
RBC: 4.93 x10E6/uL (ref 4.14–5.80)
RDW: 12.3 % (ref 11.6–15.4)
WBC: 8.6 10*3/uL (ref 3.4–10.8)

## 2023-09-06 LAB — TSH: TSH: 2.51 u[IU]/mL (ref 0.450–4.500)

## 2023-09-06 MED ORDER — IMIQUIMOD 5 % EX CREA: TOPICAL_CREAM | CUTANEOUS | 0 refills | Status: DC

## 2023-09-06 NOTE — Telephone Encounter (Signed)
 Meds ordered this encounter  Medications   imiquimod (ALDARA) 5 % cream    Sig: Apply topically 3 (three) times a week.    Dispense:  12 each    Refill:  0

## 2023-09-06 NOTE — Telephone Encounter (Signed)
 Copied from CRM [id number]. Topic: General - Other >> Sep 06, 2023  9:27 AM Franky GRADE wrote: Reason for CRM: Karleen from [facility] pharmacy is calling to advise that luoruracil (CARAC ) 0.5 % cream is not covered by the patient's insurance and the cream is also on back order. Their phone number is [phone].

## 2023-09-06 NOTE — Telephone Encounter (Signed)
Fluoruracil is not covered. The preferred medication is imiquimod cream 5% per pharmacy.

## 2023-09-12 ENCOUNTER — Ambulatory Visit: Payer: Medicare Other

## 2023-09-12 DIAGNOSIS — J849 Interstitial pulmonary disease, unspecified: Secondary | ICD-10-CM

## 2023-09-12 DIAGNOSIS — I7123 Aneurysm of the descending thoracic aorta, without rupture: Secondary | ICD-10-CM | POA: Diagnosis not present

## 2023-09-12 DIAGNOSIS — R590 Localized enlarged lymph nodes: Secondary | ICD-10-CM | POA: Diagnosis not present

## 2023-09-12 DIAGNOSIS — J84112 Idiopathic pulmonary fibrosis: Secondary | ICD-10-CM

## 2023-09-19 ENCOUNTER — Ambulatory Visit (INDEPENDENT_AMBULATORY_CARE_PROVIDER_SITE_OTHER): Payer: Medicare Other

## 2023-09-19 VITALS — BP 116/80 | HR 72 | Ht 67.0 in | Wt 156.2 lb

## 2023-09-19 DIAGNOSIS — I10 Essential (primary) hypertension: Secondary | ICD-10-CM | POA: Diagnosis not present

## 2023-09-19 NOTE — Patient Instructions (Signed)
 Return in 2 weeks for nurse visit BP check and bring written BP log for provider review at this time.

## 2023-09-19 NOTE — Progress Notes (Signed)
   Established Patient Office Visit  Subjective   Patient ID: Todd Parsons, male    DOB: [birth year]  Age: 78 y.o. MRN: [id number]  Chief Complaint  Patient presents with   Blood Pressure Check    BP check nurse visit.     HPI  BP check nurse visit-  patient denies chest pain, shortness of breath, dizziness, palpitations. Patient has held Amlodipine  for the past 2 weeks and home BP readings have improved. Wife does state that BP was elevated for last two readings but did not keep a written log of these for provider review. Oxygen  at check in was 89% but after having patient do a forceful cough and resting increased to 92% -patient states he does have diagnosis of pulmonary fibrosis.   ROS    Objective:     BP 116/80 (BP Location: Left Arm, Patient Position: Sitting, Cuff Size: Normal)   Pulse 72   Ht 5' 7 (1.702 m)   Wt 156 lb 4 oz (70.9 kg)   SpO2 92%   BMI 24.47 kg/m    Physical Exam   No results found for any visits on 09/19/23.    The 10-year ASCVD risk score (Arnett DK, et al., 2019) is: 48.4%    Assessment & Plan:  BP check nurse visit. BP reading= 116/80. Per Dr. Alvan - schedule another nurse visit BP check in 2 weeks  and keep a written log of BP during this time and bring to appointment.  Problem List Items Addressed This Visit   None   No follow-ups on file.    Suzen SHAUNNA Plenty, LPN

## 2023-09-21 ENCOUNTER — Ambulatory Visit: Payer: Medicare Other | Admitting: Pulmonary Disease

## 2023-09-21 ENCOUNTER — Encounter: Payer: Self-pay | Admitting: Pulmonary Disease

## 2023-09-21 VITALS — BP 102/60 | HR 73 | Ht 66.0 in | Wt 156.0 lb

## 2023-09-21 DIAGNOSIS — J84112 Idiopathic pulmonary fibrosis: Secondary | ICD-10-CM

## 2023-09-21 DIAGNOSIS — Z5181 Encounter for therapeutic drug level monitoring: Secondary | ICD-10-CM

## 2023-09-21 DIAGNOSIS — R634 Abnormal weight loss: Secondary | ICD-10-CM

## 2023-09-21 LAB — PULMONARY FUNCTION TEST
FEF 25-75 Post: 1.71 L/s
FEF 25-75 Pre: 1.4 L/s
FEF2575-%Change-Post: 22 %
FEF2575-%Pred-Post: 100 %
FEF2575-%Pred-Pre: 81 %
FEV1-%Change-Post: 7 %
FEV1-%Pred-Post: 44 %
FEV1-%Pred-Pre: 41 %
FEV1-Post: 1.1 L
FEV1-Pre: 1.02 L
FEV1FVC-%Change-Post: 5 %
FEV1FVC-%Pred-Pre: 117 %
FEV6-%Change-Post: 1 %
FEV6-%Pred-Post: 38 %
FEV6-%Pred-Pre: 37 %
FEV6-Post: 1.23 L
FEV6-Pre: 1.21 L
FEV6FVC-%Pred-Post: 107 %
FEV6FVC-%Pred-Pre: 107 %
FVC-%Change-Post: 1 %
FVC-%Pred-Post: 35 %
FVC-%Pred-Pre: 34 %
FVC-Post: 1.23 L
FVC-Pre: 1.21 L
Post FEV1/FVC ratio: 89 %
Post FEV6/FVC ratio: 100 %
Pre FEV1/FVC ratio: 84 %
Pre FEV6/FVC Ratio: 100 %
RV % pred: 66 %
RV: 1.6 L
TLC % pred: 45 %
TLC: 2.81 L

## 2023-09-21 NOTE — Progress Notes (Signed)
 Patient unable to perform diffusion capacity without cough. Spirometry pre/ post and lung volumes performed today.

## 2023-09-21 NOTE — Patient Instructions (Addendum)
 Patient performed spirometry pre/post and lung volumes today.

## 2023-09-21 NOTE — Progress Notes (Signed)
Todd Parsons    [id number]    08/24/1946  Primary Care Physician:Metheney, Barbarann Ehlers, MD  Referring Physician: Agapito Games, MD [address] [place],  [place] [address]  Chief complaint:  Follow-up for ILD Started Esbriet November 2022 Esbriet dose lowered in June 2023  HPI: 78 y.o.  ex-smoker with history of a flutter, aortic valve replacement, aortic aneurysm repair, hyperlipidemia, diabetes  Referred for evaluation of pulmonary fibrosis He was previously seen in 2018 by Dr. Vassie Loll in the pulmonary clinic when he had a CT scan which showed pulmonary fibrosis.  He has been lost to follow-up since then. Complains of chronic dyspnea on exertion which has been stable over several years.  No symptoms at rest.  Denies any cough, sputum production, fevers, chills  Started Esbriet in November 2022 He had to reduce the dose of Esbriet since he was having issues with fatigue, loss of taste and weight loss.  Esbriet held and resumed at a lower dose around June 2023.  He is currently taking 2 tablets 3 times a day with improvement in side effects symptoms.  Finished cardiopulmonary rehab in early 2023  Pets: Has a dog Occupation: Retired Nurse, learning disability for [employer] Exposures: No known exposures.  No mold, hot tub, Jacuzzi.  No feather pillows or comforter ILD questionnaire 06/17/2021-negative for exposures. Smoking history: 17-pack-year smoker.  Quit in 1979 Travel history: Originally from [place].  No significant recent travel Relevant family history: No family history of lung disease  Interim history: Discussed the use of AI scribe software for clinical note transcription with the patient, who gave verbal consent to proceed.  The patient, with a known diagnosis of pulmonary fibrosis, reports a worsening of symptoms including increased coughing, decreased lung volume, and increased shortness of breath. Despite being on Esbriet, the patient has  not seen a significant improvement in his condition. The patient has attempted to increase his dosage of Esbriet, but did not notice any significant improvement and thus reverted back to his original dosage of 2 tabs/3 times a day. The patient's condition has led him to apply for VA disability, as the fibrosis has had the most significant impact on his health compared to his other health issues. The patient has also experienced weight loss, which is a concern for both the patient and his spouse. The patient has made dietary changes to try and combat this weight loss, but it continues to be a concern.    Outpatient Encounter Medications as of 09/21/2023  Medication Sig   amLODipine (NORVASC) 5 MG tablet TAKE 1 TABLET BY MOUTH DAILY   aspirin EC 81 MG tablet Take 81 mg by mouth daily.   atorvastatin (LIPITOR) 40 MG tablet Take 1 tablet (40 mg total) by mouth at bedtime.   cetirizine (ZYRTEC) 10 MG tablet Take 10 mg by mouth as needed for allergies.   fenofibrate 160 MG tablet Take 1 tablet (160 mg total) by mouth daily.   finasteride (PROSCAR) 5 MG tablet Take 1 tablet (5 mg total) by mouth daily.   FLUoxetine (PROZAC) 40 MG capsule Take 1 capsule (40 mg total) by mouth daily.   fluticasone (FLONASE) 50 MCG/ACT nasal spray Place 2 sprays into both nostrils daily as needed for allergies or rhinitis.   imiquimod (ALDARA) 5 % cream Apply topically 3 (three) times a week.   metFORMIN (GLUCOPHAGE-XR) 500 MG 24 hr tablet Take 1 tablet (500 mg total) by mouth daily  with breakfast.   metoprolol succinate (TOPROL-XL) 25 MG 24 hr tablet Take 1 tablet (25 mg total) by mouth daily. Take with or immediately following a meal.   pantoprazole (PROTONIX) 40 MG tablet Take 1 tablet (40 mg total) by mouth daily. About 30 min before breakfast   Pirfenidone (ESBRIET) 267 MG TABS Take 2 tablets (534 mg total) by mouth with breakfast, with lunch, and with evening meal. **low dose as maintenance**   tamsulosin (FLOMAX) 0.4  MG CAPS capsule Take 1 capsule (0.4 mg total) by mouth 2 (two) times daily. (Patient taking differently: Take 0.4 mg by mouth daily.)   [DISCONTINUED] fluoruracil (CARAC) 0.5 % cream Apply topically daily. X 4 weeks to spot on right forehead   No facility-administered encounter medications on file as of 09/21/2023.    Allergies as of 09/21/2023   (No Known Allergies)    Physical Exam: Blood pressure 102/60, pulse 73, height 5\' 6"  (1.676 m), weight 156 lb (70.8 kg), SpO2 93%. Gen:      No acute distress HEENT:  EOMI, sclera anicteric Neck:     No masses; no thyromegaly Lungs:    Bibasal craqckles CV:         Regular rate and rhythm; no murmurs Abd:      + bowel sounds; soft, non-tender; no palpable masses, no distension Ext:    No edema; adequate peripheral perfusion Skin:      Warm and dry; no rash Neuro: alert and oriented x 3 Psych: normal mood and affect   Data Reviewed: Imaging: High-res CT 07/22/2017-pulmonary fibrosis, probable UIP High-res CT 09/09/2020-slight worsening of pulmonary fibrosis and probable UIP pattern High-res CT 05/19/2022-progression of pulmonary fibrosis High-res CT 09/09/2020-minimal worsening of pulmonary fibrosis. High-res CT 09/12/2023-progressive fibrotic interstitial lung disease and UIP pattern I have reviewed the images personally.  PFTs: 08/24/2017 FVC 2.02 [50%], FEV1 1.89 [69%], F/F 94, TLC 3.62 [56%], DLCO 15.57 [5%] Severe restriction and diffusion defect  05/14/2022 FVC 1.54 [43%], FEV1 1.44 [57%], F/F 94, TLC 2.88 [46%], DLCO 13.85 [63%] Severe restriction, moderate diffusion defect  09/21/2023 FVC 1.23 [35%], FEV1 1.10 [44%], TLC 2.81 [45%] Severe restriction  Labs: CTD, hypersensitivity serologies 08/24/2017-negative ANA, CCP, negative hypersensitivity panel  Labs from primary care BNP 07/14/2022-120.5 CBC 07/14/2022-within normal limits Basic metabolic panel 07/14/2022-normal.  Assessment:  Idiopathic Pulmonary Fibrosis Progressive  disease with worsening symptoms and decline in lung function tests. Currently on Esbriet 267mg  two tablets three times a day, but not at full dose due to side effects. Discussed the potential benefits of increasing dose or switching to Ofev, but patient prefers to maintain current regimen.  Discussed pulmonary rehab, transplant referral but he would like to hold off for now  -Continue Esbriet 267mg  two tablets three times a day. -Consider increasing Esbriet to three tablets at night. -Consider switching to Ofev if weight loss continues or if patient is unable to increase Esbriet dose. -Follow labs for therapeutic monitoring  Weight Loss Significant weight loss likely secondary to disease progression and medication side effects. Discussed the importance of maintaining weight and increasing protein intake. Patient declined appetite enhancer. -Encourage high protein diet and increased caloric intake. -Consider appetite enhancer if weight loss continues.  Cough, postnasal drip He will try over-the-counter antihistamine such as Benadryl, chlorpheniramine and steroid nasal spray Nasonex  General Health Maintenance Patient has applied for VA disability and may require documentation of medical conditions. -Provide necessary documentation for VA disability application as needed.  Follow-up -Continue current management and follow-up as  scheduled.     Plan/Recommendations: Continue Esbriet Check labs  Chilton Greathouse MD [facility] 09/21/2023, 3:35 PM  CC: Agapito Games, *

## 2023-09-21 NOTE — Patient Instructions (Signed)
 VISIT SUMMARY:  During today's visit, we discussed the worsening of your pulmonary fibrosis symptoms, including increased coughing, decreased lung volume, and shortness of breath. We also addressed your ongoing weight loss and the steps you have taken to manage it. Additionally, we reviewed your current medication regimen and potential adjustments.  YOUR PLAN:  -IDIOPATHIC PULMONARY FIBROSIS: Idiopathic Pulmonary Fibrosis is a progressive lung disease that causes scarring of the lung tissue, leading to difficulty breathing. You are currently taking Esbriet  267mg  two tablets three times a day. We discussed the possibility of increasing your dose to three tablets at night or switching to a different medication, Ofev, if your weight loss continues or if you cannot tolerate a higher dose of Esbriet .  -WEIGHT LOSS: Your weight loss is likely due to the progression of your lung disease and the side effects of your medication. It is important to maintain your weight and increase your protein intake. We encourage you to follow a high-protein diet and increase your caloric intake. If your weight loss continues, we may consider an appetite enhancer.  -GENERAL HEALTH MAINTENANCE: You have applied for VA disability and may need documentation of your medical conditions. We will provide the necessary documentation for your application as needed.  INSTRUCTIONS:  We are awaiting the results of your CT scan. If there are significant changes, we will contact you. Please continue with your current management plan and follow up as scheduled.

## 2023-09-22 ENCOUNTER — Other Ambulatory Visit: Payer: Self-pay | Admitting: Family Medicine

## 2023-09-23 ENCOUNTER — Telehealth: Payer: Self-pay

## 2023-09-23 NOTE — Telephone Encounter (Signed)
Copied from CRM [id number]. Topic: Clinical - Medical Advice >> Sep 23, 2023 12:59 PM Gaetano Hawthorne wrote: Reason for CRM: Patient was recently seen by a pulmonologist and they asked whether he has had a RSV vaccine. Patient's spouse was wondering if that's something yearly or something that is given out as necessary. If the patient is due for one, they'd like to know if he could get it along with his BP check on 01/27 during their nursing visit.

## 2023-09-23 NOTE — Telephone Encounter (Signed)
Patient  has had RSV vaccine on 09/02/2022 By [facility] Attempted call to patient to inform.  Phone rang without answer could not leave a voice mail message.

## 2023-09-28 NOTE — Telephone Encounter (Signed)
Attempted call to patient on mobile number. Phone rang without answer. Could not leave a voice mail message.  Attempted call to spouse, Meriam Sprague, left a voice mail message requesting  a return call.

## 2023-10-03 ENCOUNTER — Ambulatory Visit (INDEPENDENT_AMBULATORY_CARE_PROVIDER_SITE_OTHER): Payer: Medicare Other

## 2023-10-03 VITALS — BP 91/63 | HR 72

## 2023-10-03 DIAGNOSIS — I10 Essential (primary) hypertension: Secondary | ICD-10-CM | POA: Diagnosis not present

## 2023-10-03 NOTE — Progress Notes (Signed)
   Established Patient Office Visit  Subjective   Patient ID: Todd Parsons, male    DOB: Mar 06, 1946  Age: 78 y.o. MRN: [id number]  Chief Complaint  Patient presents with   Hypertension    HPI  Todd Parsons is here for blood pressure check. He is holding the amlodipine. Denies chest pain, shortness of breath or dizziness.   Home readings 102/71 80 99/69 76 102/30 73 113/84 70 121/86 72 115/81 69 133/91 67  ROS    Objective:     BP 91/63   Pulse 72   SpO2 94%    Physical Exam   No results found for any visits on 10/03/23.    The 10-year ASCVD risk score (Arnett DK, et al., 2019) is: 34.8%    Assessment & Plan:  Blood pressure check - Per Dr Linford Arnold, Patient advised to stay off the amlodipine and continue to take the metoprolol once daily.   Problem List Items Addressed This Visit       Unprioritized   Essential hypertension, benign - Primary    Return in about 3 months (around 01/01/2024) for HTN with Dr Linford Arnold. Earna Coder, Janalyn Harder, CMA

## 2023-10-04 NOTE — Telephone Encounter (Signed)
Patient informed in office  by Cherylann Parr  on 10/03/23

## 2023-11-01 DIAGNOSIS — H2513 Age-related nuclear cataract, bilateral: Secondary | ICD-10-CM | POA: Diagnosis not present

## 2023-11-01 DIAGNOSIS — H524 Presbyopia: Secondary | ICD-10-CM | POA: Diagnosis not present

## 2023-11-02 ENCOUNTER — Other Ambulatory Visit: Payer: Self-pay | Admitting: Family Medicine

## 2023-11-02 NOTE — Telephone Encounter (Signed)
 Copied from CRM [id number]. Topic: Clinical - Medication Refill >> Nov 02, 2023 10:27 AM Eunice Blase wrote: Most Recent Primary Care Visit:  Provider: Chalmers Cater  Department: [facility] CARE [facility]  Visit Type: NURSE VISIT  Date: 10/03/2023  Medication: FLUoxetine (PROZAC) 40 MG capsule  Has the patient contacted their pharmacy? Yes (Agent: If no, request that the patient contact the pharmacy for the refill. If patient does not wish to contact the pharmacy document the reason why and proceed with request.) (Agent: If yes, when and what did the pharmacy advise?)  Is this the correct pharmacy for this prescription? Yes If no, delete pharmacy and type the correct one.  This is the patient's preferred pharmacy:    [facility] - [place], [place] - [address] [place] [address] Phone: [phone] Fax: [phone]   Has the prescription been filled recently? Yes  Is the patient out of the medication? Yes  Has the patient been seen for an appointment in the last year OR does the patient have an upcoming appointment? Yes  Can we respond through MyChart? Yes  Agent: Please be advised that Rx refills may take up to 3 business days. We ask that you follow-up with your pharmacy.

## 2023-11-04 NOTE — Telephone Encounter (Signed)
 [facility]e [facility]. This was already filled for a year in January.  I am not sure why they are not automatically shipping that out to him and why he is calling for refills.

## 2023-11-16 ENCOUNTER — Other Ambulatory Visit: Payer: Self-pay | Admitting: Family Medicine

## 2023-11-16 DIAGNOSIS — E119 Type 2 diabetes mellitus without complications: Secondary | ICD-10-CM

## 2023-11-17 ENCOUNTER — Other Ambulatory Visit: Payer: Self-pay | Admitting: Family Medicine

## 2023-11-17 ENCOUNTER — Telehealth: Payer: Self-pay

## 2023-11-17 NOTE — Telephone Encounter (Signed)
 Copied from CRM [id number]. Topic: General - Other >> Nov 17, 2023  1:47 PM Dondra Prader E wrote: Reason for CRM: Pt called to report that he is completely out of his current supply of his FLUoxetine (PROZAC) 40 MG capsule

## 2023-11-17 NOTE — Telephone Encounter (Signed)
 Refill for fluoxetine sent today.

## 2023-12-04 ENCOUNTER — Other Ambulatory Visit: Payer: Self-pay | Admitting: Family Medicine

## 2023-12-04 DIAGNOSIS — E119 Type 2 diabetes mellitus without complications: Secondary | ICD-10-CM

## 2023-12-19 ENCOUNTER — Telehealth: Payer: Self-pay | Admitting: Family Medicine

## 2023-12-19 DIAGNOSIS — R0989 Other specified symptoms and signs involving the circulatory and respiratory systems: Secondary | ICD-10-CM

## 2023-12-19 DIAGNOSIS — R053 Chronic cough: Secondary | ICD-10-CM

## 2023-12-19 DIAGNOSIS — R0982 Postnasal drip: Secondary | ICD-10-CM

## 2023-12-19 DIAGNOSIS — J84112 Idiopathic pulmonary fibrosis: Secondary | ICD-10-CM

## 2023-12-19 MED ORDER — PIRFENIDONE 267 MG PO TABS: 534.0000 mg | ORAL_TABLET | Freq: Three times a day (TID) | ORAL | 0 refills | Status: DC

## 2023-12-19 NOTE — Telephone Encounter (Signed)
 Called and scheduled an appointment

## 2023-12-19 NOTE — Addendum Note (Signed)
 Addended by: Thais Fill on: 12/19/2023 02:08 PM   Modules accepted: Orders

## 2023-12-19 NOTE — Telephone Encounter (Signed)
 Copied from CRM [id number]. Topic: Clinical - Medication Refill >> Dec 19, 2023 11:07 AM Juliana Ocean wrote: Most Recent Primary Care Visit:  Provider: Doretha Ganja  Department: [facility] CARE [facility]  Visit Type: NURSE VISIT  Date: 10/03/2023  Medication: Pirfenidone (ESBRIET) 267 MG TABS  Has the patient contacted their pharmacy? Yes ( Is this the correct pharmacy for this prescription? Yes If no, delete pharmacy and type the correct one.  This is the patient's preferred pharmacy:   [facility] - [place], [place] - [address] [place] [address] Phone: [phone] Fax: [phone]     Has the prescription been filled recently? Yes  Is the patient out of the medication? Yes  Has the patient been seen for an appointment in the last year OR does the patient have an upcoming appointment? Yes  Can we respond through MyChart? No  Agent: Please be advised that Rx refills may take up to 3 business days. We ask that you follow-up with your pharmacy.

## 2023-12-19 NOTE — Telephone Encounter (Signed)
 Refill sent for PIRFENIDONE to [facility]) for Esbriet: [phone]  Dose: 534mg  three times daily  Last OV: 09/21/2023 Provider: Dr. Waylan Haggard Pertinent labs: CMET on 09/05/2023  Next OV: due but not scheduled  Routing to scheduling team for follow-up on appt scheduling  Geraldene Kleine, PharmD, MPH, BCPS Clinical Pharmacist (Rheumatology and Pulmonology)

## 2023-12-21 NOTE — Progress Notes (Signed)
 [facility] Quality Team Note  Name: Todd Parsons Date of Birth: Nov 30, 1945 MRN: [id number] Date: 12/21/2023  [facility] Quality Team has reviewed this patient's chart, please see recommendations below:  [facility] Quality Other; (PATIENT NEEDS A1C, eGFR AND URINE MICROALBUMIN/CREATININE RATIO COMPLETED AT UPCOMING OFFICE VISIT WITH PCP 01/03/2024)

## 2024-01-03 ENCOUNTER — Encounter: Payer: Self-pay | Admitting: Family Medicine

## 2024-01-03 ENCOUNTER — Ambulatory Visit (INDEPENDENT_AMBULATORY_CARE_PROVIDER_SITE_OTHER): Payer: Medicare Other | Admitting: Family Medicine

## 2024-01-03 VITALS — BP 129/76 | HR 62 | Ht 67.0 in | Wt 151.0 lb

## 2024-01-03 DIAGNOSIS — E1159 Type 2 diabetes mellitus with other circulatory complications: Secondary | ICD-10-CM | POA: Diagnosis not present

## 2024-01-03 DIAGNOSIS — I10 Essential (primary) hypertension: Secondary | ICD-10-CM | POA: Diagnosis not present

## 2024-01-03 DIAGNOSIS — M4000 Postural kyphosis, site unspecified: Secondary | ICD-10-CM | POA: Diagnosis not present

## 2024-01-03 DIAGNOSIS — E559 Vitamin D deficiency, unspecified: Secondary | ICD-10-CM | POA: Diagnosis not present

## 2024-01-03 LAB — POCT GLYCOSYLATED HEMOGLOBIN (HGB A1C): Hemoglobin A1C: 5.4 % (ref 4.0–5.6)

## 2024-01-03 NOTE — Patient Instructions (Addendum)
 If you need something for allergies use flonase  or nasonex instead of Zyrtec, Allegra or Claritin.

## 2024-01-03 NOTE — Progress Notes (Signed)
 Established Patient Office Visit  Subjective  Patient ID: Todd Parsons, male    DOB: [birth year]  Age: 78 y.o. MRN: [id number]  Chief Complaint  Patient presents with   Diabetes    HPI  Diabetes - no hypoglycemic events. No wounds or sores that are not healing well. No increased thirst or urination. Checking glucose at home. Taking medications as prescribed without any side effects.  He is here today with his wife and doing well overall.  His wife has a couple of concerns she has noticed that he starting to kind of hunched forward with his shoulders so she has been trying to encourage him to sit a little bit more upright.  He still really struggling with fatigue and running out of energy pretty quickly.  He takes about 4 naps per day which she thinks is a lot.    He is on Esbriet  for his idiopathic pulmonary fibrosis which he does not feel great on.  He feels like it contributes to a lot of his fatigue.  He is not wearing her oxygen     ROS    Objective:     BP 129/76   Pulse 62   Ht 5\' 7"  (1.702 m)   Wt 151 lb (68.5 kg)   SpO2 92%   BMI 23.65 kg/m    Physical Exam Vitals and nursing note reviewed.  Constitutional:      Appearance: Normal appearance.  HENT:     Head: Normocephalic and atraumatic.  Eyes:     Conjunctiva/sclera: Conjunctivae normal.  Cardiovascular:     Rate and Rhythm: Normal rate and regular rhythm.  Pulmonary:     Effort: Pulmonary effort is normal.     Breath sounds: Normal breath sounds.     Comments: Forced breath sounds bilaterally with some slight crackles at the bases.  No wheezing today. Skin:    General: Skin is warm and dry.  Neurological:     Mental Status: He is alert.  Psychiatric:        Mood and Affect: Mood normal.      Results for orders placed or performed in visit on 01/03/24  Urine Microalbumin w/creat. ratio  Result Value Ref Range   Creatinine, Urine WILL FOLLOW    Microalbumin, Urine WILL FOLLOW    Microalb/Creat  Ratio WILL FOLLOW   VITAMIN D  25 Hydroxy (Vit-D Deficiency, Fractures)  Result Value Ref Range   Vit D, 25-Hydroxy 25.0 (L) 30.0 - 100.0 ng/mL  CMP14+EGFR  Result Value Ref Range   Glucose 107 (H) 70 - 99 mg/dL   BUN 15 8 - 27 mg/dL   Creatinine, Ser 4.09 0.76 - 1.27 mg/dL   eGFR 84 >81 XB/JYN/8.29   BUN/Creatinine Ratio 16 10 - 24   Sodium 141 134 - 144 mmol/L   Potassium 4.2 3.5 - 5.2 mmol/L   Chloride 103 96 - 106 mmol/L   CO2 27 20 - 29 mmol/L   Calcium  9.3 8.6 - 10.2 mg/dL   Total Protein 6.4 6.0 - 8.5 g/dL   Albumin 3.9 3.8 - 4.8 g/dL   Globulin, Total 2.5 1.5 - 4.5 g/dL   Bilirubin Total 0.4 0.0 - 1.2 mg/dL   Alkaline Phosphatase 91 44 - 121 IU/L   AST 20 0 - 40 IU/L   ALT 15 0 - 44 IU/L  POCT HgB A1C  Result Value Ref Range   Hemoglobin A1C 5.4 4.0 - 5.6 %   HbA1c POC (<> result, manual entry)  HbA1c, POC (prediabetic range)     HbA1c, POC (controlled diabetic range)        The 10-year ASCVD risk score (Arnett DK, et al., 2019) is: 55.1%    Assessment & Plan:   Problem List Items Addressed This Visit       Cardiovascular and Mediastinum   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  will stop the metformin .        Relevant Orders   POCT HgB A1C (Completed)   Urine Microalbumin w/creat. ratio (Completed)   VITAMIN D  25 Hydroxy (Vit-D Deficiency, Fractures) (Completed)   CMP14+EGFR (Completed)   Essential hypertension, benign   At goal today.          Other   Vitamin D  deficiency   Relevant Orders   VITAMIN D  25 Hydroxy (Vit-D Deficiency, Fractures) (Completed)   Other Visit Diagnoses       Postural kyphosis, unspecified spinal region       Relevant Orders   Urine Microalbumin w/creat. ratio (Completed)   VITAMIN D  25 Hydroxy (Vit-D Deficiency, Fractures) (Completed)   CMP14+EGFR (Completed)   DG Bone Density      Kyphosis-we discussed making sure that he is getting an adequate calcium   with vitamin D .  We could certainly look at a bone density for him.  In addition to just really focusing on pox sure making sure getting adequate protein intake and actually doing some resistance training such as bands or light weights to improve activity level.  He also discussed avoiding Benadryl  type products and using something like Flonase  or Nasonex if needed instead of antihistamines.  Return in about 4 months (around 05/04/2024) for Diabetes follow-up.    Duaine German, MD

## 2024-01-03 NOTE — Assessment & Plan Note (Signed)
 A1C looks great at 5.4.  will stop the metformin .

## 2024-01-03 NOTE — Assessment & Plan Note (Signed)
 At goal today

## 2024-01-04 ENCOUNTER — Encounter: Payer: Self-pay | Admitting: Family Medicine

## 2024-01-04 NOTE — Progress Notes (Signed)
 Hi Todd Parsons, your vitamin D is definitely low so we want to get that up.  Recommend starting with 25 mcg daily.  We can recheck your level in 3 months to make sure that you are getting to an adequate level once we get you to a normal level then I would just recommend that you take your vitamin D through the fall winter and early spring months every year.  Your metabolic panel overall looks good.  Urine test is still pending.

## 2024-01-05 LAB — CMP14+EGFR
ALT: 15 IU/L (ref 0–44)
AST: 20 IU/L (ref 0–40)
Albumin: 3.9 g/dL (ref 3.8–4.8)
Alkaline Phosphatase: 91 IU/L (ref 44–121)
BUN/Creatinine Ratio: 16 (ref 10–24)
BUN: 15 mg/dL (ref 8–27)
Bilirubin Total: 0.4 mg/dL (ref 0.0–1.2)
CO2: 27 mmol/L (ref 20–29)
Calcium: 9.3 mg/dL (ref 8.6–10.2)
Chloride: 103 mmol/L (ref 96–106)
Creatinine, Ser: 0.93 mg/dL (ref 0.76–1.27)
Globulin, Total: 2.5 g/dL (ref 1.5–4.5)
Glucose: 107 mg/dL — ABNORMAL HIGH (ref 70–99)
Potassium: 4.2 mmol/L (ref 3.5–5.2)
Sodium: 141 mmol/L (ref 134–144)
Total Protein: 6.4 g/dL (ref 6.0–8.5)
eGFR: 84 mL/min/{1.73_m2} (ref >59)

## 2024-01-05 LAB — MICROALBUMIN / CREATININE URINE RATIO
Creatinine, Urine: 121.5 mg/dL
Microalb/Creat Ratio: 26 mg/g{creat} (ref 0–29)
Microalbumin, Urine: 32.1 ug/mL

## 2024-01-05 LAB — VITAMIN D 25 HYDROXY (VIT D DEFICIENCY, FRACTURES): Vit D, 25-Hydroxy: 25 ng/mL — ABNORMAL LOW (ref 30.0–100.0)

## 2024-01-05 NOTE — Progress Notes (Signed)
 Urine test looks good no excess protein.

## 2024-01-10 ENCOUNTER — Other Ambulatory Visit: Payer: Self-pay | Admitting: Pharmacist

## 2024-01-10 DIAGNOSIS — J84112 Idiopathic pulmonary fibrosis: Secondary | ICD-10-CM

## 2024-01-10 MED ORDER — ESBRIET 267 MG PO TABS: 534.0000 mg | ORAL_TABLET | Freq: Three times a day (TID) | ORAL | 1 refills | Status: DC

## 2024-01-10 NOTE — Telephone Encounter (Signed)
 Refill sent for ESBRIET  to [facility] for Esbriet : [phone]  Dose: 534mg  three times daily  Last OV: 09/21/2023 Provider: Dr. Waylan Haggard Pertinent labs: LFTs on 01/03/2024 wnl  Next OV: not yet scheduled  Geraldene Kleine, PharmD, MPH, BCPS Clinical Pharmacist (Rheumatology and Pulmonology)

## 2024-01-24 DIAGNOSIS — I10 Essential (primary) hypertension: Secondary | ICD-10-CM | POA: Diagnosis not present

## 2024-01-24 DIAGNOSIS — Z9889 Other specified postprocedural states: Secondary | ICD-10-CM | POA: Diagnosis not present

## 2024-01-24 DIAGNOSIS — I25810 Atherosclerosis of coronary artery bypass graft(s) without angina pectoris: Secondary | ICD-10-CM | POA: Diagnosis not present

## 2024-01-24 DIAGNOSIS — R0600 Dyspnea, unspecified: Secondary | ICD-10-CM | POA: Diagnosis not present

## 2024-01-24 DIAGNOSIS — Z952 Presence of prosthetic heart valve: Secondary | ICD-10-CM | POA: Diagnosis not present

## 2024-01-25 ENCOUNTER — Ambulatory Visit (INDEPENDENT_AMBULATORY_CARE_PROVIDER_SITE_OTHER)

## 2024-01-25 DIAGNOSIS — Z13820 Encounter for screening for osteoporosis: Secondary | ICD-10-CM

## 2024-01-25 DIAGNOSIS — M4000 Postural kyphosis, site unspecified: Secondary | ICD-10-CM

## 2024-01-26 ENCOUNTER — Encounter: Payer: Self-pay | Admitting: Family Medicine

## 2024-01-26 ENCOUNTER — Ambulatory Visit: Payer: Self-pay | Admitting: Family Medicine

## 2024-01-26 ENCOUNTER — Other Ambulatory Visit: Payer: Self-pay | Admitting: Family Medicine

## 2024-01-26 DIAGNOSIS — I10 Essential (primary) hypertension: Secondary | ICD-10-CM

## 2024-01-26 DIAGNOSIS — R0982 Postnasal drip: Secondary | ICD-10-CM

## 2024-01-26 DIAGNOSIS — E785 Hyperlipidemia, unspecified: Secondary | ICD-10-CM

## 2024-01-26 DIAGNOSIS — R053 Chronic cough: Secondary | ICD-10-CM

## 2024-01-26 DIAGNOSIS — R0989 Other specified symptoms and signs involving the circulatory and respiratory systems: Secondary | ICD-10-CM

## 2024-01-26 DIAGNOSIS — N401 Enlarged prostate with lower urinary tract symptoms: Secondary | ICD-10-CM

## 2024-01-26 NOTE — Progress Notes (Signed)
 Hi Todd Parsons, bone density shows a T-score of -0.6 which puts you technically into the normal range so no sign of thin bones at this point in time.  Just make sure you are getting adequate calcium  and vitamin D  in your diet as well as doing some resistance/weight training.

## 2024-02-06 ENCOUNTER — Ambulatory Visit: Payer: Self-pay

## 2024-02-06 NOTE — Telephone Encounter (Signed)
 Copied from CRM [id number]. Topic: Clinical - Red Word Triage >> Feb 06, 2024  4:56 PM Eveleen Hinds B wrote: [facility] that prompted transfer to Nurse Triage: Wife states diff breathing, out of breath   Chief Complaint: Difficulty breathing Symptoms: Increased difficulty breathing  Frequency: Intermittent  Disposition: [] ED /[] Urgent Care (no appt availability in office) / [x] Appointment(In office/virtual)/ []  [facility]/ [] Home Care/ [] Refused Recommended Disposition /[]  Mobile Bus/ []  Follow-up with PCP Additional Notes: Patient's wife called stating the patient has had increased difficulty breathing for the last month. She states that he gets winded faster than normal. She states he has not had any other symptoms with his breathing difficulty. Appointment made for the patient tomorrow for evaluation.     E2C2 Pulmonary Triage - Initial Assessment Questions "Chief Complaint (e.g., cough, sob, wheezing, fever, chills, sweat or additional symptoms) *Go to specific symptom protocol after initial questions. Increased shortness of breath   "How long have symptoms been present?" 3-4 weeks   Have you tested for COVID or Flu? Note: If not, ask patient if a home test can be taken. If so, instruct patient to call back for positive results. No  MEDICINES:   "Have you used any OTC meds to help with symptoms?" Yes If yes, ask "What medications?" Allergy pill  "Have you used your inhalers/maintenance medication?" No If yes, "What medications?" N/A  If inhaler, ask "How many puffs and how often?" Note: Review instructions on medication in the chart. N/A  OXYGEN : "Do you wear supplemental oxygen ?" Yes If yes, "How many liters are you supposed to use?" Uses as needed only    Reason for Disposition  [1] Longstanding difficulty breathing (e.g., CHF, COPD, emphysema) AND [2] WORSE than normal  Protocols used: Breathing Difficulty-A-AH

## 2024-02-06 NOTE — Progress Notes (Signed)
 02/07/24- 44 yoM former smoker for acute visit. Last seen by Dr Theophilus 09/21/23  for exacerbation of ILD on Esbriet  since 11/22, complicated by CAD/CABG, AFlutter, Aortic Valve Repair, Aortic Aneurysm, Hyperlipidemia, DM, -Esbriet  267 mg, 2 tabs 3x daily.  Qualified for POC [facility] July 2024 Followed by [facility] with increased dyspnea x 1 month. At last visit with Dr Forrestine- options to change to [facility], transplant referral discussed but patient didn't want to change. Discussed the use of AI scribe software for clinical note transcription with the patient, who gave verbal consent to proceed.  History of Present Illness   Todd Parsons is a 78 year old male with pulmonary fibrosis who presents with worsening shortness of breath and fatigue. He is accompanied by his wife. This was indicated by E2C[facility] as urgent, so he was scheduled with me.  His next scheduled appt with Dr Forrestine is in August. He and his wife have noted some increased dyspnea with exertion since weather has warmed, but he denies any acute change, chest pain, edema or evidence of infection.  He  has not been bothering to wear his home O2 at night, or his POC outdoors, as with lawn/garden work, despite some desaturation into 80's. What is new is that he is sitting to pace himself more with exertion over the last 6-8 weeks..  Over the past few weeks, there is a gradual worsening of pulmonary fibrosis symptoms, including increased shortness of breath on exertion and significant fatigue, necessitating frequent naps. He uses a portable oxygen  concentrator at home when sedentary but finds it cumbersome and does not use it during physical activity, affecting his ability to perform outdoor activities.  He experiences nighttime breathing difficulties, described as a 'dragging noise' when sleeping with his mouth open. His wife describes more mouth breathing. SABRA He does not use oxygen  at night but takes a generic Claritin in the morning and uses  nasal spray at night to maintain airway patency.    He asks samples of esbreit we were able to provide.    He reviewed his history of CABG and Aortic Valve replacement and is followed by cardiology.     Assessment and Plan:    Pulmonary fibrosis/ ILD Progressive pulmonary fibrosis with increased dyspnea. Prefers Esbriet  due to familiarity and side effect profile. Weight loss noted with preference for sweets. [place] disability status under consideration. - Encourage consistent use of oxygen  concentrator during exertion and at night. - Expedite follow-up with Dr. Forrestine. - Check Esbriet  sample availability. - Consider appetite stimulants and nutritional supplements to increase caloric intake.  Hypertension Hypertension noted in medical history.  Diabetes mellitus Diabetes mellitus noted in medical history.  Heart valve replacement and coronary artery bypass History of heart valve replacement and coronary artery bypass with no current symptoms.     History of Present Illness  HRCT chest 09/12/23 IMPRESSION: 1. Progressive fibrotic interstitial lung disease. Findings are consistent with UIP per consensus guidelines: Diagnosis of Idiopathic Pulmonary Fibrosis: An Official ATS/ERS/JRS/ALAT Clinical Practice Guideline. Am JINNY Honey Crit Care Med Vol 198, Iss 5, [phone], May 07 2017. 2. Aneurysmal proximal descending thoracic aorta, stable. 3. Aortic atherosclerosis (ICD10-I70.0). Coronary artery calcification. 4. Enlarged pulmonic trunk, indicative of pulmonary arterial hypertension.

## 2024-02-07 ENCOUNTER — Ambulatory Visit: Admitting: Internal Medicine

## 2024-02-07 ENCOUNTER — Encounter: Payer: Self-pay | Admitting: Internal Medicine

## 2024-02-07 DIAGNOSIS — J84112 Idiopathic pulmonary fibrosis: Secondary | ICD-10-CM | POA: Diagnosis not present

## 2024-02-07 MED ORDER — ESBRIET 267 MG PO TABS: 534.0000 mg | ORAL_TABLET | Freq: Three times a day (TID) | ORAL | Status: DC

## 2024-02-07 NOTE — Patient Instructions (Signed)
 You need to be using your portable oxygen  more, with any exertion.  Order- samples of Esbriet  if available  Order- schedule earliest available work-in with Dr Iris Mano for ILD follow-up- first available cancellation    Use your oxygen  - 2L as discussed

## 2024-02-08 ENCOUNTER — Encounter: Payer: Self-pay | Admitting: Physician Assistant

## 2024-02-08 ENCOUNTER — Ambulatory Visit (INDEPENDENT_AMBULATORY_CARE_PROVIDER_SITE_OTHER): Admitting: Physician Assistant

## 2024-02-08 VITALS — BP 119/80 | HR 69 | Temp 98.4°F | Ht 65.0 in | Wt 152.0 lb

## 2024-02-08 DIAGNOSIS — Z9889 Other specified postprocedural states: Secondary | ICD-10-CM | POA: Diagnosis not present

## 2024-02-08 DIAGNOSIS — R0981 Nasal congestion: Secondary | ICD-10-CM | POA: Diagnosis not present

## 2024-02-08 DIAGNOSIS — J3489 Other specified disorders of nose and nasal sinuses: Secondary | ICD-10-CM

## 2024-02-08 DIAGNOSIS — N401 Enlarged prostate with lower urinary tract symptoms: Secondary | ICD-10-CM

## 2024-02-08 DIAGNOSIS — I10 Essential (primary) hypertension: Secondary | ICD-10-CM | POA: Diagnosis not present

## 2024-02-08 DIAGNOSIS — Z952 Presence of prosthetic heart valve: Secondary | ICD-10-CM | POA: Diagnosis not present

## 2024-02-08 DIAGNOSIS — R0989 Other specified symptoms and signs involving the circulatory and respiratory systems: Secondary | ICD-10-CM

## 2024-02-08 DIAGNOSIS — R351 Nocturia: Secondary | ICD-10-CM

## 2024-02-08 DIAGNOSIS — I25810 Atherosclerosis of coronary artery bypass graft(s) without angina pectoris: Secondary | ICD-10-CM | POA: Diagnosis not present

## 2024-02-08 MED ORDER — IPRATROPIUM BROMIDE 0.03 % NA SOLN: 2.0000 | Freq: Two times a day (BID) | NASAL | 1 refills | Status: DC

## 2024-02-08 MED ORDER — CETIRIZINE HCL 10 MG PO TABS: 10.0000 mg | ORAL_TABLET | Freq: Every day | ORAL | Status: AC

## 2024-02-08 MED ORDER — TERAZOSIN HCL 1 MG PO CAPS: 1.0000 mg | ORAL_CAPSULE | Freq: Every day | ORAL | 1 refills | Status: DC

## 2024-02-08 NOTE — Progress Notes (Signed)
 Established Patient Office Visit  Subjective   Patient ID: Todd Parsons, male    DOB: 07-Nov-1945  Age: 78 y.o. MRN: [id number]  Chief Complaint  Patient presents with   Medical Management of Chronic Issues    allergies and congestion hx of lung disease and pulmonary fibrosis    HPI Pt is a 78 yo male who presents to the clinic with his wife to discuss nasal congestion and draining for "a long time". He admits he is not taking allergy medication. He has discussed with PCP about allergy referral. He has tried nasal sprays in the past and not seemed to help. His symptoms are constant most days but he has some times where symptoms are worse.     .. Active Ambulatory Problems    Diagnosis Date Noted   Essential hypertension, benign 05/05/2011   Hyperlipidemia LDL goal <100 05/05/2011   OSA (obstructive sleep apnea) 05/05/2011   Thoracic aortic aneurysm without rupture (HCC) 05/05/2011   Congenital insufficiency of aortic valve 05/05/2011   History of bicuspid aortic valve 05/05/2011   Vitamin D  deficiency 05/05/2011   Hx of arthroscopy of knee 06/08/2011   Restrictive airway disease 08/09/2014   Type 2 diabetes mellitus with cardiac complication (HCC) 01/30/2015   H/O aortic valve replacement 09/15/2015   History of atrial flutter 09/15/2015   IPF (idiopathic pulmonary fibrosis) (HCC) 12/22/2015   Primary osteoarthritis of both knees 02/10/2016   OAB (overactive bladder) 04/02/2016   Wears hearing aid 06/02/2016   CAD (coronary artery disease) 04/15/2017   Chronic cough 11/06/2018   Seborrheic keratoses 09/10/2019   Acute pain of right shoulder 09/10/2019   Benign prostatic hyperplasia (BPH) with urinary urgency 04/25/2020   Word finding difficulty 04/25/2020   Abnormal weight loss 08/26/2020   Unspecified mood (affective) disorder (HCC) 08/26/2020   Memory changes 11/23/2021   Chronic respiratory failure with hypoxia (HCC) 06/25/2022   Chronic throat clearing 05/02/2023    Nasal congestion with rhinorrhea 02/13/2024   Benign prostatic hyperplasia with nocturia 02/13/2024   Resolved Ambulatory Problems    Diagnosis Date Noted   Osteoarthritis of left knee 05/19/2012   Bug bite 06/15/2012   Osteoarthritis of right knee 07/26/2012   Long term current use of anticoagulant therapy 10/17/2015   Lung crackles 12/10/2015   Viral URI 12/10/2015   Primary osteoarthritis of right knee 02/28/2017   Bilateral hearing loss due to cerumen impaction 11/06/2018   SOB (shortness of breath) on exertion 09/10/2019   Elevated blood pressure reading 09/10/2019   Past Medical History:  Diagnosis Date   Arthritis    Bicuspid aortic valve    Cataract    Coronary artery disease    Dysrhythmia    Echocardiogram with ECG monitoring 01/12/2010   Hyperlipidemia    Hypertension    Pre-diabetes       ROS See HPI.    Objective:     BP 119/80   Pulse 69   Temp 98.4 F (36.9 C) (Oral)   Ht 5\' 5"  (1.651 m)   Wt 152 lb (68.9 kg)   SpO2 99%   BMI 25.29 kg/m    Physical Exam Constitutional:      Appearance: Normal appearance.  HENT:     Head: Normocephalic.     Right Ear: Tympanic membrane, ear canal and external ear normal. There is no impacted cerumen.     Left Ear: Tympanic membrane and external ear normal. There is no impacted cerumen.     Nose: Congestion  present.     Mouth/Throat:     Mouth: Mucous membranes are moist.  Eyes:     Extraocular Movements: Extraocular movements intact.     Conjunctiva/sclera: Conjunctivae normal.     Pupils: Pupils are equal, round, and reactive to light.  Cardiovascular:     Rate and Rhythm: Normal rate and regular rhythm.  Pulmonary:     Effort: Pulmonary effort is normal.     Breath sounds: Normal breath sounds.  Musculoskeletal:     Cervical back: Normal range of motion and neck supple. No tenderness.     Right lower leg: No edema.     Left lower leg: No edema.  Lymphadenopathy:     Cervical: No cervical  adenopathy.  Neurological:     General: No focal deficit present.     Mental Status: He is alert and oriented to person, place, and time.  Psychiatric:        Mood and Affect: Mood normal.      The 10-year ASCVD risk score (Arnett DK, et al., 2019) is: 50%    Assessment & Plan:  Todd AasAaron AasDomenica Fried "BJ" was seen today for medical management of chronic issues.  Diagnoses and all orders for this visit:  Nasal congestion with rhinorrhea -     cetirizine  (ZYRTEC ) 10 MG tablet; Take 1 tablet (10 mg total) by mouth daily. -     ipratropium (ATROVENT ) 0.03 % nasal spray; Place 2 sprays into both nostrils every 12 (twelve) hours. -     Ambulatory referral to Allergy  Chronic throat clearing -     cetirizine  (ZYRTEC ) 10 MG tablet; Take 1 tablet (10 mg total) by mouth daily. -     Ambulatory referral to Allergy  Benign prostatic hyperplasia with nocturia -     terazosin  (HYTRIN ) 1 MG capsule; Take 1 capsule (1 mg total) by mouth at bedtime.   Long hx if nasal congestion and rhinorrhea ? If flomax  is causing Consider 2 weeks not taking and see if symptoms improve Could replace with terazosin  Start zyrtec  daily Trial of atrovent  nasal spray twice a day Referral made to allergy   Sandy Crumb, PA-C

## 2024-02-08 NOTE — Patient Instructions (Addendum)
 Increase zyrtec to twice a day until you see allergist Trial of atrovent  twice a day Stop tamsulosin  and start terazosin  and see if helps with any nasal congestion

## 2024-02-13 ENCOUNTER — Encounter: Payer: Self-pay | Admitting: Physician Assistant

## 2024-02-13 DIAGNOSIS — N401 Enlarged prostate with lower urinary tract symptoms: Secondary | ICD-10-CM | POA: Insufficient documentation

## 2024-02-13 DIAGNOSIS — R0981 Nasal congestion: Secondary | ICD-10-CM | POA: Insufficient documentation

## 2024-02-22 ENCOUNTER — Telehealth: Payer: Self-pay | Admitting: Pulmonary Disease

## 2024-02-22 NOTE — Telephone Encounter (Signed)
 Rc'd fax from [facility] for O2. Will fwd to Dr. Waylan Haggard for signature.

## 2024-02-23 NOTE — Telephone Encounter (Signed)
 O2 ordered through [facility], sent back to [facility] to contact [employer].

## 2024-03-14 ENCOUNTER — Encounter: Payer: Self-pay | Admitting: Internal Medicine

## 2024-04-10 ENCOUNTER — Telehealth: Payer: Self-pay

## 2024-04-10 DIAGNOSIS — J84112 Idiopathic pulmonary fibrosis: Secondary | ICD-10-CM

## 2024-04-10 NOTE — Telephone Encounter (Signed)
 See additional phone encounter  documentation

## 2024-04-10 NOTE — Telephone Encounter (Signed)
 Copied from CRM [id number]. Topic: Clinical - Medical Advice >> Apr 09, 2024  4:00 PM Chantha C wrote: Reason for CRM: Patient [phone] states wants to increase Esbriet  267mg  6 pills daily to 7 pills daily, please authorize to [facility] pharmacy. Patient does not have the phone number and is not out of medication, but need to order 2 weeks in advances.   Also Patient is starting an exercise program at [facility] fitness and needs medical clearnance from Dr. Theophilus for patient to join. Please advise and call back.  Spoke with patient regarding prior message. Advised patient Dr.Mannam is not in the office this week and will return on Monday.Patient is ok with waiting until Dr.Mannam returns back . Patient would like to increase Esbriet  267mg  6 pills daily to 7 pills daily,And patient would also need a letter for patient to start a  exercise program at [facility] fitness and needs medical clearnance from Dr. Theophilus for patient to join.  Dr.Mannam can you please advise

## 2024-04-20 MED ORDER — ESBRIET 267 MG PO TABS
801.0000 mg | ORAL_TABLET | Freq: Three times a day (TID) | ORAL | 2 refills | Status: DC
Start: 2024-04-20 — End: 2024-05-24

## 2024-04-20 NOTE — Telephone Encounter (Signed)
 CCing the pharmacy team regarding his request to increase Esbriet  dose. Okay to increase Esbriet  dose and give letter approving exercise program

## 2024-04-20 NOTE — Telephone Encounter (Signed)
 I have sent rx for ESBRIET  3 tabs three times daily.  Patient can adjust dose increase and will have plenty of tablets to do so  Sherry Pennant, PharmD, MPH, BCPS, CPP Clinical Pharmacist (Rheumatology and Pulmonology)

## 2024-05-03 ENCOUNTER — Telehealth (HOSPITAL_BASED_OUTPATIENT_CLINIC_OR_DEPARTMENT_OTHER): Payer: Self-pay

## 2024-05-03 DIAGNOSIS — Z5181 Encounter for therapeutic drug level monitoring: Secondary | ICD-10-CM

## 2024-05-03 DIAGNOSIS — J84112 Idiopathic pulmonary fibrosis: Secondary | ICD-10-CM

## 2024-05-03 NOTE — Telephone Encounter (Unsigned)
 Copied from CRM [id number]. Topic: Clinical - Prescription Issue >> May 03, 2024 11:33 AM Todd Parsons wrote: Reason for CRM: Patient called in regarding medication. Patient stated he was taking 6 pills of ESBRIET  267 MG TABS pills a day and he had taken a larger dose where he was taking 9 a day but it did not work out as it was too much, patient stated 9 a day is a optimal dosage but the 6 a day. Patient stated he has a 30 day supply of 9 pills a day from [facility]. Patient stated he would like to increase 30 day supply to 90 day supply for a quantity of 9 pills per day. Patient is requesting a 60 day supply to add to his 30 days he currently received.

## 2024-05-04 NOTE — Telephone Encounter (Signed)
 I do not understand the message. If the patient is able to tolerate 9 pills a day of pirfenidone  then that is optimal. It is ok to use a lower dose too if 9 pills a day is not possible.  Will cc the pharm team about his request for 90 day supply  Adisyn Ruscitti MD [facility] See Amion for pager  If no response to pager , please call [phone] until 7pm After 7:00 pm call Elink  [phone] 05/04/2024, 8:40 AM

## 2024-05-04 NOTE — Telephone Encounter (Signed)
 Todd Parsons

## 2024-05-04 NOTE — Addendum Note (Signed)
 Addended by: Jazzlene Huot L on: 05/04/2024 03:28 PM   Modules accepted: Orders

## 2024-05-08 ENCOUNTER — Ambulatory Visit (INDEPENDENT_AMBULATORY_CARE_PROVIDER_SITE_OTHER): Admitting: Family Medicine

## 2024-05-08 ENCOUNTER — Encounter: Payer: Self-pay | Admitting: Family Medicine

## 2024-05-08 VITALS — BP 122/84 | HR 86 | Ht 65.0 in | Wt 147.1 lb

## 2024-05-08 DIAGNOSIS — R634 Abnormal weight loss: Secondary | ICD-10-CM | POA: Diagnosis not present

## 2024-05-08 DIAGNOSIS — I10 Essential (primary) hypertension: Secondary | ICD-10-CM

## 2024-05-08 DIAGNOSIS — R3915 Urgency of urination: Secondary | ICD-10-CM

## 2024-05-08 DIAGNOSIS — F39 Unspecified mood [affective] disorder: Secondary | ICD-10-CM

## 2024-05-08 DIAGNOSIS — N401 Enlarged prostate with lower urinary tract symptoms: Secondary | ICD-10-CM

## 2024-05-08 DIAGNOSIS — R053 Chronic cough: Secondary | ICD-10-CM

## 2024-05-08 DIAGNOSIS — Z23 Encounter for immunization: Secondary | ICD-10-CM | POA: Diagnosis not present

## 2024-05-08 DIAGNOSIS — E1159 Type 2 diabetes mellitus with other circulatory complications: Secondary | ICD-10-CM

## 2024-05-08 DIAGNOSIS — L989 Disorder of the skin and subcutaneous tissue, unspecified: Secondary | ICD-10-CM

## 2024-05-08 LAB — POCT GLYCOSYLATED HEMOGLOBIN (HGB A1C): Hemoglobin A1C: 5.4 % (ref 4.0–5.6)

## 2024-05-08 MED ORDER — PAROXETINE HCL 20 MG PO TABS: 20.0000 mg | ORAL_TABLET | Freq: Every day | ORAL | 1 refills | Status: DC

## 2024-05-08 NOTE — Progress Notes (Signed)
 Established Patient Office Visit  Subjective  Patient ID: Todd Parsons, male    DOB: [birth year]  Age: 78 y.o. MRN: [id number]  Chief Complaint  Patient presents with   Diabetes    HPI  Discussed the use of AI scribe software for clinical note transcription with the patient, who gave verbal consent to proceed.  History of Present Illness Todd Parsons is a 78 year old male who presents for a routine follow-up and vaccination review. He is accompanied by his wife.  Vaccination status and preventive care - Received influenza vaccine today - Discussed COVID-19 vaccination; aware that individuals under 65 now require a prescription - New COVID-19 vaccine version expected to be available in September - Uncertainty regarding receipt of second dose of shingles vaccine; verification with pharmacy pending  Appetite and nutritional intake - Struggles with appetite, particularly in the morning - Wife prepares creative meals to support intake - Typically consumes a small breakfast, a good lunch or brunch, and a reasonable dinner - Weight is stable at 147 pounds - Not interested in appetite stimulant medications  Medication effects and adherence - Takes Esbriet  (pirfenidone ): two in the morning, two in the afternoon, three at night; believes this affects morning appetite - Takes fluoxetine  for mood - Currently taking tamsulosin  and has adequate supply - Previously prescribed terazosin  as an alternative due to coughing and allergy symptoms, but has not started it  Chronic cough and allergy symptoms - Chronic cough and allergy symptoms persist - [place] to symptoms - Referral for allergy testing scheduled for September 17th  Glycemic control - A1c is stable at 5.4, consistent with previous results  He also has a couple skin lesions 1 on his right forehead and 1 on his left edge of his jaw/chin area and a small one near the top of his scalp.  He  says they have been there for months they get scaly from time to time usually if he scrubs at the area or shaves the scale will go down but then never really heal they are otherwise not bothersome they are not painful or itchy or irritated.  He also had a question about the terazosin  and wondered if it was in the same family as the tamsulosin  he is only been taking the tamsulosin  he never actually tried the terazosin .  It was recommended to try as an alternative but he never did.  He says he would prefer to stick with the tamsulosin  for now.  He did schedule an appointment with the allergist for the chronic throat clearing and cough.    ROS    Objective:     BP 122/84   Pulse 86   Ht 5' 5 (1.651 m)   Wt 147 lb 1.9 oz (66.7 kg)   SpO2 93%   BMI 24.48 kg/m    Physical Exam Vitals and nursing note reviewed.  Constitutional:      Appearance: Normal appearance.  HENT:     Head: Normocephalic and atraumatic.  Eyes:     Conjunctiva/sclera: Conjunctivae normal.  Cardiovascular:     Rate and Rhythm: Normal rate and regular rhythm.  Pulmonary:     Effort: Pulmonary effort is normal.     Breath sounds: Normal breath sounds.  Skin:    General: Skin is warm and dry.  Neurological:     Mental Status: He is alert.  Psychiatric:        Mood and Affect: Mood  normal.      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       Relevant Orders   POCT HgB A1C (Completed)   Essential hypertension, benign    Well controlled. Continue current regimen. Follow up in  3 - 4months.         Other   Unspecified mood (affective) disorder (HCC)   Will going to try switching to Paxil  to see if this also improves appetite.      Chronic cough   Benign prostatic hyperplasia (BPH) with urinary urgency   Abnormal weight loss   Weight is now down 247 pounds.  We did discuss the option of considering a switch from Prozac  to Paxil .  Just to see if this would stimulate his appetite he did not want to take any additional medications he feels like he takes enough pills as it is but was willing to swap 1 out.      Other Visit Diagnoses       Encounter for immunization       Relevant Orders   Flu vaccine HIGH DOSE PF(Fluzone Trivalent) (Completed)     Skin lesion       Relevant Orders   Ambulatory referral to Dermatology      Assessment and Plan Assessment & Plan Appetite loss Appetite loss persists with morning decrease and preference for smaller meals. Current weight is 147 lbs, raising concern for further weight loss. He declined additional medications for appetite stimulation. - Consider switching fluoxetine  to Paxil  for potential appetite stimulation.  He is agreeable to the change so new prescription sent to the pharmacy.  Mood disorder unspecified Currently managed with fluoxetine , which is weight neutral. Paxil  may be considered for appetite stimulation while managing mood and irritability. - Consider switching fluoxetine  to Paxil .  Benign prostatic hyperplasia with lower urinary tract symptoms Managed with tamsulosin . Terazosin  was previously prescribed but not used. - Continue tamsulosin . - Remove terazosin  from medication list.  Chronic cough Chronic allergy symptoms with coughing and throat clearing. Allergy testing scheduled to identify specific allergens. - Proceed with allergy testing on May 23, 2024.  General Health Maintenance Received flu shot. COVID vaccine update  pending. RSV vaccine effective for 5-10 years. Tetanus vaccine up to date until 2033. Pneumonia vaccine current. Shingles vaccine status needs verification. - Ensure COVID vaccine is the new version when available. - Verify completion of shingles vaccine series. - Check [facility] for immunization records.  Skin lesions-most consistent with either an actinic keratosis or early squamous cell I would like to refer him to dermatology for further evaluation since they are fairly larger areas.  Return in about 3 months (around 08/07/2024) for Mood, weight mgt .    Dorothyann Byars, MD

## 2024-05-08 NOTE — Assessment & Plan Note (Signed)
 Weight is now down 247 pounds.  We did discuss the option of considering a switch from Prozac  to Paxil .  Just to see if this would stimulate his appetite he did not want to take any additional medications he feels like he takes enough pills as it is but was willing to swap 1 out.

## 2024-05-08 NOTE — Assessment & Plan Note (Signed)
Well controlled. Continue current regimen. Follow up in  3-4 months.  

## 2024-05-08 NOTE — Assessment & Plan Note (Signed)
 A1 looks great today!!!

## 2024-05-08 NOTE — Patient Instructions (Signed)
 I am changing your fluoxetine  to Paroxetine  for mood

## 2024-05-08 NOTE — Assessment & Plan Note (Signed)
 Will going to try switching to Paxil  to see if this also improves appetite.

## 2024-05-21 ENCOUNTER — Encounter: Payer: Self-pay | Admitting: Internal Medicine

## 2024-05-21 ENCOUNTER — Ambulatory Visit (INDEPENDENT_AMBULATORY_CARE_PROVIDER_SITE_OTHER): Admitting: Internal Medicine

## 2024-05-21 ENCOUNTER — Other Ambulatory Visit: Payer: Self-pay

## 2024-05-21 VITALS — BP 92/76 | HR 114 | Temp 97.9°F | Ht 63.78 in | Wt 143.3 lb

## 2024-05-21 DIAGNOSIS — J310 Chronic rhinitis: Secondary | ICD-10-CM | POA: Diagnosis not present

## 2024-05-21 DIAGNOSIS — R0982 Postnasal drip: Secondary | ICD-10-CM | POA: Diagnosis not present

## 2024-05-21 DIAGNOSIS — R053 Chronic cough: Secondary | ICD-10-CM

## 2024-05-21 NOTE — Progress Notes (Signed)
 NEW PATIENT Date of Service/Encounter:   05/21/2024 Referring provider: Alvan Dorothyann BIRCH, * Primary care provider: Alvan Dorothyann BIRCH, MD  Subjective:  Todd Parsons is a 78 y.o. male with a PMHx of HTN, OSA, hyperlipidemia, TAA, vitamin D  deficiency, DM II, CAD, BPH presenting today for evaluation of chronic rhinitis and throat clearing. History obtained from: chart review and patient and spouse.   Discussed the use of AI scribe software for clinical note transcription with the patient, who gave verbal consent to proceed.  History of Present Illness Todd Parsons is a 79 year old male who presents with chronic cough and nasal drainage. He is accompanied by his wife, Todd Parsons.  Chronic nasal drainage and rhinorrhea - Frequent sniffling and persistent nasal drainage attributed to sinus issues - Symptoms exacerbated by outdoor exposure, especially in a tree-surrounded environment - Increased nasal drainage and difficulty breathing when outdoors - Current medications include daily cetirizine  and Flonase , with occasional use of Atrovent  nasal spray - History of Afrin use, diluted with saline to avoid rebound congestion  Chronic cough - Persistent cough attributed to post-nasal drip - Coughing occurs constantly, primarily to clear drainage from the throat - No sputum production - No symptoms of acid reflux or heartburn - denies history of asthma  Allergic sensitization - History of childhood allergy testing positive for feathers and dust - No recent allergy testing or allergy injections - No history of asthma, eczema, food allergies, or medication allergies  Relevant surgical and trauma history - Tonsillectomy in childhood - History of nasal fracture as a teenager, which was set - No history of sinus surgery     Chart Review:  Reviewed PCP notes from referral 02/08/24: nasal congestion and rhinorrhea with throat clearing; plan: zyrtec , ipatropium nasal spray  and allergy referral   Past Medical History: Past Medical History:  Diagnosis Date   Arthritis    osteoarthritis   Bicuspid aortic valve    Cataract    Coronary artery disease    Dysrhythmia    atrial flutter   Echocardiogram with ECG monitoring 01/12/2010   60-65% EF   Hyperlipidemia    Hypertension    Pre-diabetes    Medication List:  Current Outpatient Medications  Medication Sig Dispense Refill   amLODipine  (NORVASC ) 5 MG tablet Take 5 mg by mouth daily.     aspirin  EC 81 MG tablet Take 81 mg by mouth daily.     atorvastatin  (LIPITOR) 40 MG tablet TAKE 1 TABLET BY MOUTH AT  BEDTIME 100 tablet 2   cetirizine  (ZYRTEC ) 10 MG tablet Take 1 tablet (10 mg total) by mouth daily.     ESBRIET  267 MG TABS Take 3 tablets (801 mg total) by mouth 3 (three) times daily with meals. **dose increase** 270 tablet 2   fenofibrate  160 MG tablet TAKE 1 TABLET BY MOUTH DAILY 100 tablet 2   finasteride  (PROSCAR ) 5 MG tablet TAKE 1 TABLET BY MOUTH DAILY 100 tablet 2   fluticasone  (FLONASE ) 50 MCG/ACT nasal spray Place 2 sprays into both nostrils daily as needed for allergies or rhinitis. 48 g 3   pantoprazole  (PROTONIX ) 40 MG tablet TAKE 1 TABLET BY MOUTH DAILY  ABOUT 1/2 HOUR BEFORE BREAKFAST (Patient taking differently: as needed.) 100 tablet 2   PARoxetine  (PAXIL ) 20 MG tablet Take 1 tablet (20 mg total) by mouth daily. 30 tablet 1   tamsulosin  (FLOMAX ) 0.4 MG CAPS capsule Take 1 capsule (0.4 mg total) by mouth 2 (two) times  daily. (Patient taking differently: Take 0.4 mg by mouth daily.) 180 capsule 3   ipratropium (ATROVENT ) 0.03 % nasal spray Place 2 sprays into both nostrils every 12 (twelve) hours. (Patient not taking: Reported on 05/21/2024) 30 mL 1   metoprolol  succinate (TOPROL -XL) 25 MG 24 hr tablet TAKE 1 TABLET BY MOUTH DAILY  WITH OR IMMEDIATELY FOLLOWING A  MEAL (Patient not taking: Reported on 05/21/2024) 100 tablet 2   No current facility-administered medications for this visit.    Known Allergies:  No Known Allergies Past Surgical History: Past Surgical History:  Procedure Laterality Date   A-FLUTTER ABLATION  2017   ACROMIO-CLAVICULAR JOINT REPAIR Right 1980s       apnea device implant     then removed.     CARDIAC CATHETERIZATION  07/2015   CARDIAC VALVE REPLACEMENT  08/05/2015   Aortic valve   COLONOSCOPY     CORONARY ARTERY BYPASS GRAFT  08/05/2015   left knee arthoscopy     NOSE SURGERY  late 50's   to fix a broken nose   TONSILECTOMY, ADENOIDECTOMY, BILATERAL MYRINGOTOMY AND TUBES     TONSILLECTOMY     TOTAL KNEE ARTHROPLASTY Right 02/28/2017   Procedure: RIGHT TOTAL KNEE ARTHROPLASTY;  Surgeon: Yvone Rush, MD;  Location: [facility] OR;  Service: Orthopedics;  Laterality: Right;   Family History: Family History  Problem Relation Age of Onset   Stroke Mother 91   Cancer Mother        upper palate   Heart disease Father 89       bypass surgery   Colon cancer Neg Hx    Colon polyps Neg Hx    Esophageal cancer Neg Hx    Rectal cancer Neg Hx    Stomach cancer Neg Hx    Social History: Todd Parsons lives in a house built 41 years ago, no water damage, carpet in bedroom, heat pump heating and gas, indoor dog, no roaches, not using DM covers on mattress or pillows, retired. Smoked from high school to 1970, 10 pack years..   ROS:  All other systems negative except as noted per HPI.  Objective:  Blood pressure 92/76, pulse (!) 114, temperature 97.9 F (36.6 C), temperature source Temporal, height 5' 3.78 (1.62 m), weight 143 lb 4.8 oz (65 kg), SpO2 92%. Body mass index is 24.77 kg/m. Physical Exam:  General Appearance:  Alert, cooperative, no distress, appears stated age, frequent throat clearing  Head:  Normocephalic, without obvious abnormality, atraumatic  Eyes:  Conjunctiva clear, EOM's intact  Ears EACs normal bilaterally and normal TMs bilaterally  Nose: Nares normal, normal mucosa and no visible anterior polyps  Throat: Lips, tongue normal;  teeth and gums normal, normal posterior oropharynx and + cobblestoning  Neck: Supple, symmetrical  Lungs:   clear to auscultation bilaterally, Respirations unlabored, no coughing  Heart:  regular rate and rhythm and no murmur, Appears well perfused  Extremities: No edema  Skin: Skin color, texture, turgor normal and no rashes or lesions on visualized portions of skin  Neurologic: No gross deficits   Diagnostics:   Labs:  Lab Orders  No laboratory test(s) ordered today     Assessment and Plan  Assessment and Plan Assessment & Plan Chronic cough and postnasal dripe Chronic cough attributed to postnasal drip, likely due to sinus drainage. No evidence of acid reflux contributing to symptoms. Persistent throat clearing likely due to irritation from mucus. No asthma or eczema. No significant congestion or polyps on examination. - Schedule allergy testing  for next Monday at 1:30 PM. - Instruct to stop Zyrtec  three days before allergy testing. - Provide handout on gentle throat clearing techniques. - Advise to stay hydrated and use sugar-free lozenges to keep throat moist.  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 Possible exacerbation from environmental exposure, such as outdoor activities. Use of Afrin noted, with caution advised due to potential rebound congestion. - Recommend daily use of Flonase  nasal spray. - Advise use of saline spray or rinse to maintain nasal moisture. - Instruct to use Atrovent  sparingly, only if experiencing significant runny nose or drainage despite Flonase  use. 1 spray each nostril up to twice daily as needed - Advise against regular use of Afrin due to risk of rebound congestion.  Follow up : next Monday at 1:30 PM; must stop zyrtec  3 days prior to visit (1-55). It was a pleasure meeting you in clinic today! Thank you for allowing me to participate  in your care.  Rocky Endow, MD [facility]     This note in its entirety was forwarded to the Provider who requested this consultation.  Other: none  Thank you for your kind referral. I appreciate the opportunity to take part in Todd Parsons's care. Please do not hesitate to contact me with questions.  Sincerely,  Rocky Endow, MD [facility] 

## 2024-05-21 NOTE — Patient Instructions (Signed)
 Chronic cough and postnasal drip Chronic cough attributed to postnasal drip, likely due to sinus drainage. No evidence of acid reflux contributing to symptoms. Persistent throat clearing likely due to irritation from mucus. No asthma or eczema. No significant congestion or polyps on examination. - Schedule allergy testing for next Monday at 1:30 PM. - Instruct to stop Zyrtec  three days before allergy testing. - Provide handout on gentle throat clearing techniques. - Advise to stay hydrated and use sugar-free lozenges to keep throat moist.  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 Possible exacerbation from environmental exposure, such as outdoor activities. Use of Afrin noted, with caution advised due to potential rebound congestion. - Recommend daily use of Flonase  nasal spray. - Advise use of saline spray or rinse to maintain nasal moisture. - Instruct to use Atrovent  sparingly, only if experiencing significant runny nose or drainage despite Flonase  use. 1 spray each nostril up to twice daily as needed - Advise against regular use of Afrin due to risk of rebound congestion.  Follow up : next Monday at 1:30 PM; must stop zyrtec  3 days prior to visit (1-55). It was a pleasure meeting you in clinic today! Thank you for allowing me to participate in your care.  Rocky Endow, MD [facility]  CHRONIC THROAT CLEARING-WHAT TO DO!! Causes of chronic throat clearing may include the following (among others):  Acid reflux (lanyngopharyngeal reflux) Allergies (pollens, pet dander, dust mites, etc) Non allergic rhinitis (runny nose, post nasal drainage or congestion NOT caused by allergies-can be secondary to pollutants, cold air, changes in blood vessels to the nose as we age,etc) Environmental irritants (tobacco, smoke, air pollution) Asthma If present for a long period  of time, throat clearing can become a habit. We will work with you to treat any of the medical reasons for throat clearing.  Your job is to help prevent the habit, which can cause damage (redness and swelling) to your vocal cords.  It will require a conscious effort on your behalf.  Tips for prevention of throat clearing:  Instead of clearing your throat, swallow instead.   Carrying around water (or something to drink) will help you move the mucus in the right direction.  IF you have the urge to clear your throat, drink your water. If you absolutely have to clear your throat, use a non-traumatic exercise to do so.   Pant with your mouth open saying "[place], [place], [place]" with a powerful, breathy voice. Increase water intake.  This thins secretions, making them easier to swallow. Chew baking soda gum ([employer]) which can help with swallowing, reflux, and throat clearing.  Try to chew up to three times daily.   If you experience jaw pain or headaches, decrease the amount of chewing. Suck on sugar free hard candy to help with swallowing. Have your friends and family remind you to swallow when they hear you throat clearing.  As this can be habit forming, sometimes you may not realize you are doing this.  Having someone point it out to you, will help you become more conscious of the behavior. BE PATIENT.  This will take time to resolve, and some do not see improvement until 8-12 weeks into therapy/behavior modifications.

## 2024-05-22 ENCOUNTER — Telehealth: Payer: Self-pay | Admitting: *Deleted

## 2024-05-22 NOTE — Telephone Encounter (Signed)
 Copied from CRM #[id number]. Topic: Clinical - Medication Question >> May 21, 2024  4:41 PM Lavanda D wrote: Reason for CRM: [name] with [name] calling in regards to Todd Parsons ESBRIET  267 MG TABS, he stated that the forms for this medication are expired and he needs a new one in order filled out in order to renew it.   Callback #: [phone]

## 2024-05-22 NOTE — Telephone Encounter (Signed)
 Called [employer] and requested they fax over new form to [phone]

## 2024-05-23 ENCOUNTER — Encounter: Payer: Self-pay | Admitting: Pulmonary Disease

## 2024-05-23 ENCOUNTER — Ambulatory Visit (INDEPENDENT_AMBULATORY_CARE_PROVIDER_SITE_OTHER): Admitting: Pulmonary Disease

## 2024-05-23 VITALS — BP 100/80 | HR 114 | Temp 97.8°F | Ht 66.0 in | Wt 144.0 lb

## 2024-05-23 DIAGNOSIS — J849 Interstitial pulmonary disease, unspecified: Secondary | ICD-10-CM

## 2024-05-23 DIAGNOSIS — J84112 Idiopathic pulmonary fibrosis: Secondary | ICD-10-CM

## 2024-05-23 DIAGNOSIS — Z5181 Encounter for therapeutic drug level monitoring: Secondary | ICD-10-CM

## 2024-05-23 DIAGNOSIS — R0600 Dyspnea, unspecified: Secondary | ICD-10-CM

## 2024-05-23 NOTE — Progress Notes (Signed)
 Todd Parsons    [id number]    1946/02/08  Primary Care Physician:Metheney, Dorothyann BIRCH, MD  Referring Physician: Alvan Dorothyann BIRCH, MD [address] [place],  [place] [address]  Chief complaint:  Follow-up for ILD Started Esbriet  November 2022 Esbriet  dose lowered in June 2023  HPI: 78 y.o.  ex-smoker with history of a flutter, aortic valve replacement, aortic aneurysm repair, hyperlipidemia, diabetes  Referred for evaluation of pulmonary fibrosis He was previously seen in 2018 by Dr. Jude in the pulmonary clinic when he had a CT scan which showed pulmonary fibrosis.  He has been lost to follow-up since then. Complains of chronic dyspnea on exertion which has been stable over several years.  No symptoms at rest.  Denies any cough, sputum production, fevers, chills  Started Esbriet  in November 2022 He had to reduce the dose of Esbriet  since he was having issues with fatigue, loss of taste and weight loss.  Esbriet  held and resumed at a lower dose around June 2023.  He is currently taking 2 tablets 3 times a day with improvement in side effects symptoms.  Finished cardiopulmonary rehab in early 2023  Interim history: Discussed the use of AI scribe software for clinical note transcription with the patient, who gave verbal consent to proceed.  History of Present Illness Todd Parsons is a 78 year old male with idiopathic pulmonary fibrosis who presents with worsening shortness of breath. He is accompanied by his wife.  Dyspnea - Gradual worsening of shortness of breath - No recent fever - No chest pain  Idiopathic pulmonary fibrosis management - Currently taking Esbriet  (pirfenidone ) with a regimen of two tablets in the morning, two in the afternoon, and three at night - No recent participation in pulmonary rehabilitation due to logistical challenges - Echocardiogram performed within the last few months, exact timing unclear  Unintentional  weight loss and nutritional status - Significant weight loss with increased prominence of shoulders - Attempts to supplement diet with extra protein and calories - Reduced appetite, typically eating only two meals per day - Sleep schedule results in sleeping until 10 AM, affecting meal timing - Recent switch from fluoxetine  to paroxetine  to help with appetite, on new medication for a couple of weeks   Relevant pulmonary history Pets: Has a dog Occupation: Retired Nurse, learning disability for [employer] Exposures: No known exposures.  No mold, hot tub, Jacuzzi.  No feather pillows or comforter ILD questionnaire 06/17/2021-negative for exposures. Smoking history: 17-pack-year smoker.  Quit in 1979 Travel history: Originally from [place] .  No significant recent travel Relevant family history: No family history of lung disease  Outpatient Encounter Medications as of 05/23/2024  Medication Sig   aspirin  EC 81 MG tablet Take 81 mg by mouth daily.   atorvastatin  (LIPITOR) 40 MG tablet TAKE 1 TABLET BY MOUTH AT  BEDTIME   cetirizine  (ZYRTEC ) 10 MG tablet Take 1 tablet (10 mg total) by mouth daily.   ESBRIET  267 MG TABS Take 3 tablets (801 mg total) by mouth 3 (three) times daily with meals. **dose increase**   fenofibrate  160 MG tablet TAKE 1 TABLET BY MOUTH DAILY   finasteride  (PROSCAR ) 5 MG tablet TAKE 1 TABLET BY MOUTH DAILY   fluticasone  (FLONASE ) 50 MCG/ACT nasal spray Place 2 sprays into both nostrils daily as needed for allergies or rhinitis.   ipratropium (ATROVENT ) 0.03 % nasal spray Place 2 sprays into both nostrils every 12 (twelve) hours.  pantoprazole  (PROTONIX ) 40 MG tablet TAKE 1 TABLET BY MOUTH DAILY  ABOUT 1/2 HOUR BEFORE BREAKFAST (Patient taking differently: as needed.)   PARoxetine  (PAXIL ) 20 MG tablet Take 1 tablet (20 mg total) by mouth daily.   tamsulosin  (FLOMAX ) 0.4 MG CAPS capsule Take 1 capsule (0.4 mg total) by mouth 2 (two) times daily. (Patient taking differently: Take  0.4 mg by mouth daily.)   amLODipine  (NORVASC ) 5 MG tablet Take 5 mg by mouth daily. (Patient not taking: Reported on 05/23/2024)   metoprolol  succinate (TOPROL -XL) 25 MG 24 hr tablet TAKE 1 TABLET BY MOUTH DAILY  WITH OR IMMEDIATELY FOLLOWING A  MEAL (Patient not taking: Reported on 05/23/2024)   No facility-administered encounter medications on file as of 05/23/2024.    Allergies as of 05/23/2024   (No Known Allergies)    Physical Exam: Blood pressure 102/60, pulse 73, height 5' 6 (1.676 m), weight 156 lb (70.8 kg), SpO2 93%. Gen:      No acute distress HEENT:  EOMI, sclera anicteric Neck:     No masses; no thyromegaly Lungs:    Bibasal craqckles CV:         Regular rate and rhythm; no murmurs Abd:      + bowel sounds; soft, non-tender; no palpable masses, no distension Ext:    No edema; adequate peripheral perfusion Skin:      Warm and dry; no rash Neuro: alert and oriented x 3 Psych: normal mood and affect   Data Reviewed: Imaging: High-res CT 07/22/2017-pulmonary fibrosis, probable UIP High-res CT 09/09/2020-slight worsening of pulmonary fibrosis and probable UIP pattern High-res CT 05/19/2022-progression of pulmonary fibrosis High-res CT 09/09/2020-minimal worsening of pulmonary fibrosis. High-res CT 09/12/2023-progressive fibrotic interstitial lung disease and UIP pattern I have reviewed the images personally.  PFTs: 08/24/2017 FVC 2.02 [50%], FEV1 1.89 [69%], F/F 94, TLC 3.62 [56%], DLCO 15.57 [5%] Severe restriction and diffusion defect  05/14/2022 FVC 1.54 [43%], FEV1 1.44 [57%], F/F 94, TLC 2.88 [46%], DLCO 13.85 [63%] Severe restriction, moderate diffusion defect  09/21/2023 FVC 1.23 [35%], FEV1 1.10 [44%], TLC 2.81 [45%] Severe restriction  Labs: CTD, hypersensitivity serologies 08/24/2017-negative ANA, CCP, negative hypersensitivity panel  Labs from primary care BNP 07/14/2022-120.5 CBC 07/14/2022-within normal limits Basic metabolic panel  07/14/2022-normal.  Cardiac: Echocardiogram 02/08/2024- LVEF 50-55%,.  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 Right heart catheterization recommended to confirm pulmonary hypertension, potentially leading to additional medication such as inhaled treprostinil to improve breathing. Participation in a clinical trial for IPF is encouraged, which includes right heart catheterization covered by the study.  We discussed transplant but his age is a barrier and he is not interested in pursuing this anyway. - Continue Esbriet  (pirfenidone ) as standard of care for IPF. - Arrange right heart catheterization to confirm pulmonary hypertension. - Referral to pulmonary rehab - Explore participation in a clinical trial for IPF, which includes right heart catheterization.  Unintentional weight loss and appetite disturbance Significant weight loss with attempts to supplement calories and protein. Appetite disturbance addressed by switching from fluoxetine  to paroxetine  to encourage appetite. Potential switch to nintedanib if weight loss becomes significant, with concerns about diarrhea as a side effect. Current regimen of paroxetine  has been in place for a few weeks. - Continue paroxetine  to encourage appetite. - Monitor weight and  consider switch to nintedanib if weight loss becomes problematic, with awareness of potential diarrhea side effect.  Fatigue Fatigue likely multifactorial, related to medications and interstitial lung disease. He takes three naps a day. Logistical issues with accessing local pulmonary rehabilitation programs. Potential for repeat rehabilitation discussed. - Check for local pulmonary rehabilitation  programs and facilitate enrollment if available. - Obtain labs to assess for any contributing factors to fatigue.   Cough, postnasal drip He will try over-the-counter antihistamine such as Benadryl , chlorpheniramine and steroid nasal spray Nasonex  General Health Maintenance Patient has applied for VA disability and may require documentation of medical conditions. -Provide necessary documentation for VA disability application as needed.  Follow-up -Continue current management and follow-up as scheduled.     Plan/Recommendations: Continue Esbriet  Check labs Explore research participation for right heart catheterization Pulmonary rehab  Lonna Coder MD [facility] 05/23/2024, 3:46 PM  CC: Alvan Dorothyann BIRCH, *

## 2024-05-23 NOTE — Addendum Note (Signed)
 Addended by: MOODY RANCHER on: 05/23/2024 04:41 PM   Modules accepted: Orders

## 2024-05-23 NOTE — Patient Instructions (Addendum)
  VISIT SUMMARY: Today, we discussed your worsening shortness of breath, weight loss, and fatigue. We also reviewed your current medications and made plans for further testing and potential changes to your treatment.  YOUR PLAN: IDIOPATHIC PULMONARY FIBROSIS WITH SUSPECTED SECONDARY PULMONARY HYPERTENSION: Your pulmonary fibrosis is being managed with Esbriet , but your shortness of breath is getting worse. We suspect you might have pulmonary hypertension, which is high blood pressure in the lungs due to scarring. -Continue taking Esbriet  (pirfenidone ) as prescribed. -We will arrange a right heart catheterization to confirm if you have pulmonary hypertension. -Consider participating in a clinical trial for IPF, which includes the right heart catheterization  UNINTENTIONAL WEIGHT LOSS AND APPETITE DISTURBANCE: You have been losing weight and have a reduced appetite. We switched your medication to paroxetine  to help with your appetite. -Continue taking paroxetine  to help with your appetite. -Monitor your weight closely. If weight loss continues, we may switch to a different medication called nintedanib, but be aware it can cause diarrhea.  FATIGUE: You are experiencing fatigue, likely due to your lung condition and medications. You also take several naps a day. -We will look for local pulmonary rehabilitation programs and help you enroll if possible. -We will do some lab tests to check for other causes of your fatigue.  BENIGN PROSTATIC HYPERPLASIA WITH LOWER URINARY TRACT SYMPTOMS: You have an enlarged prostate causing urinary symptoms, managed with tamsulosin . -Continue taking tamsulosin  once daily as it helps manage your symptoms.

## 2024-05-24 ENCOUNTER — Other Ambulatory Visit: Payer: Self-pay | Admitting: Pharmacist

## 2024-05-24 ENCOUNTER — Ambulatory Visit: Payer: Self-pay

## 2024-05-24 DIAGNOSIS — J84112 Idiopathic pulmonary fibrosis: Secondary | ICD-10-CM

## 2024-05-24 LAB — COMPREHENSIVE METABOLIC PANEL WITH GFR
ALT: 17 U/L (ref 0–53)
AST: 25 U/L (ref 0–37)
Albumin: 4.3 g/dL (ref 3.5–5.2)
Alkaline Phosphatase: 61 U/L (ref 39–117)
BUN: 22 mg/dL (ref 6–23)
CO2: 33 meq/L — ABNORMAL HIGH (ref 19–32)
Calcium: 10.7 mg/dL — ABNORMAL HIGH (ref 8.4–10.5)
Chloride: 100 meq/L (ref 96–112)
Creatinine, Ser: 0.94 mg/dL (ref 0.40–1.50)
GFR: 77.29 mL/min (ref >60.00)
Glucose, Bld: 90 mg/dL (ref 70–99)
Potassium: 5.3 meq/L — ABNORMAL HIGH (ref 3.5–5.1)
Sodium: 141 meq/L (ref 135–145)
Total Bilirubin: 0.6 mg/dL (ref 0.2–1.2)
Total Protein: 7.2 g/dL (ref 6.0–8.3)

## 2024-05-24 LAB — HEPATIC FUNCTION PANEL
AG Ratio: 1.5 (calc) (ref 1.0–2.5)
ALT: 15 U/L (ref 9–46)
AST: 23 U/L (ref 10–35)
Albumin: 4.3 g/dL (ref 3.6–5.1)
Alkaline phosphatase (APISO): 72 U/L (ref 35–144)
Bilirubin, Direct: 0.1 mg/dL (ref 0.0–0.2)
Globulin: 2.8 g/dL (ref 1.9–3.7)
Indirect Bilirubin: 0.5 mg/dL (ref 0.2–1.2)
Total Bilirubin: 0.6 mg/dL (ref 0.2–1.2)
Total Protein: 7.1 g/dL (ref 6.1–8.1)

## 2024-05-24 LAB — BRAIN NATRIURETIC PEPTIDE: Pro B Natriuretic peptide (BNP): 64 pg/mL (ref 0.0–100.0)

## 2024-05-24 MED ORDER — ESBRIET 267 MG PO TABS
801.0000 mg | ORAL_TABLET | Freq: Three times a day (TID) | ORAL | 1 refills | Status: AC
Start: 2024-05-24 — End: ?

## 2024-05-24 NOTE — Telephone Encounter (Signed)
 Form received - printed from [facility]

## 2024-05-24 NOTE — Telephone Encounter (Signed)
 Refill for Esbriet  sent to pharmacy for 90 day supply with 1 refills  LFTS from 05/22/24 wnl  Sherry Pennant, PharmD, MPH, BCPS, CPP Clinical Pharmacist [facility])

## 2024-05-28 ENCOUNTER — Ambulatory Visit: Admitting: Internal Medicine

## 2024-05-28 ENCOUNTER — Encounter (HOSPITAL_COMMUNITY): Payer: Self-pay

## 2024-05-28 DIAGNOSIS — L814 Other melanin hyperpigmentation: Secondary | ICD-10-CM | POA: Diagnosis not present

## 2024-05-28 DIAGNOSIS — L821 Other seborrheic keratosis: Secondary | ICD-10-CM | POA: Diagnosis not present

## 2024-05-28 DIAGNOSIS — D225 Melanocytic nevi of trunk: Secondary | ICD-10-CM | POA: Diagnosis not present

## 2024-05-28 DIAGNOSIS — L570 Actinic keratosis: Secondary | ICD-10-CM | POA: Diagnosis not present

## 2024-05-28 DIAGNOSIS — L579 Skin changes due to chronic exposure to nonionizing radiation, unspecified: Secondary | ICD-10-CM | POA: Diagnosis not present

## 2024-05-28 DIAGNOSIS — D235 Other benign neoplasm of skin of trunk: Secondary | ICD-10-CM | POA: Diagnosis not present

## 2024-05-29 ENCOUNTER — Ambulatory Visit (INDEPENDENT_AMBULATORY_CARE_PROVIDER_SITE_OTHER): Admitting: Internal Medicine

## 2024-05-29 DIAGNOSIS — J310 Chronic rhinitis: Secondary | ICD-10-CM | POA: Diagnosis not present

## 2024-05-29 MED ORDER — AZELASTINE HCL 0.1 % NA SOLN: 2.0000 | Freq: Two times a day (BID) | NASAL | 5 refills | Status: AC | PRN

## 2024-05-29 MED ORDER — IPRATROPIUM BROMIDE 0.03 % NA SOLN: 1.0000 | Freq: Three times a day (TID) | NASAL | 5 refills | Status: AC | PRN

## 2024-05-29 MED ORDER — FLUTICASONE PROPIONATE 50 MCG/ACT NA SUSP: 2.0000 | Freq: Every day | NASAL | 5 refills | Status: DC

## 2024-05-29 NOTE — Patient Instructions (Addendum)
 Chronic Rhinitis Chronic Cough with Post Nasal Drip  - Positive skin test 05/2024: none - Consider using nasal saline rinses before nose sprays such as with Neilmed Sinus Rinse.  Use distilled water.   - Avoid use of Afrin. If you must use, use in combination with Flonase .  - Use Flonase  2 sprays each nostril daily. Aim upward and outward. - Use Azelastine  2 sprays each nostril twice daily as needed for runny nose, drainage, sneezing, congestion. Aim upward and outward. - Use Ipratroprium 1-2 sprays up to three times daily as needed for runny nose. Aim upward and outward. This can cause dryness.   - Use Zyrtec  10 mg daily.   Chronic Cough:  CHRONIC THROAT CLEARING-WHAT TO DO!!  If present for a long period of time, throat clearing can become a habit. We will work with you to treat any of the medical reasons for throat clearing.  Your job is to help prevent the habit, which can cause damage (redness and swelling) to your vocal cords.  It will require a conscious effort on your behalf.  Tips for prevention of throat clearing:  Instead of clearing your throat, swallow instead.   Carrying around water (or something to drink) will help you move the mucus in the right direction.  IF you have the urge to clear your throat, drink your water. If you absolutely have to clear your throat, use a non-traumatic exercise to do so.   Pant with your mouth open saying "[place], [place], [place]" with a powerful, breathy voice. Increase water intake.  This thins secretions, making them easier to swallow. Chew baking soda gum (ARM & HAMMER) which can help with swallowing, reflux, and throat clearing.  Try to chew up to three times daily.   If you experience jaw pain or headaches, decrease the amount of chewing. Suck on sugar free hard candy to help with swallowing. Have your friends and family remind you to swallow when they hear you throat clearing.  As this can be habit forming, sometimes you may not realize you are doing  this.  Having someone point it out to you, will help you become more conscious of the behavior. BE PATIENT.  This will take time to resolve, and some do not see improvement until 8-12 weeks into therapy/behavior modifications.

## 2024-05-29 NOTE — Progress Notes (Signed)
 FOLLOW UP Date of Service/Encounter:  05/29/24   Subjective:  Todd Parsons (DOB: 04/24/1946) is a 78 y.o. male who [facility] for follow up for skin testing.   History obtained from: chart review and patient.  Anti histamines held.   Past Medical History: Past Medical History:  Diagnosis Date   Arthritis    osteoarthritis   Bicuspid aortic valve    Cataract    Coronary artery disease    Dysrhythmia    atrial flutter   Echocardiogram with ECG monitoring 01/12/2010   60-65% EF   Hyperlipidemia    Hypertension    Pre-diabetes     Objective:  There were no vitals taken for this visit. There is no height or weight on file to calculate BMI. Physical Exam: GEN: alert, well developed HEENT: clear conjunctiva, MMM LUNGS: unlabored respiration  Skin Testing:  Skin prick testing was placed, which includes aeroallergens/foods, histamine control, and saline control.  Verbal consent was obtained prior to placing test.  Patient tolerated procedure well.  Allergy  testing results were read and interpreted by myself, documented by clinical staff. Adequate positive and negative control.  Positive results to:  Results discussed with patient/family.  Airborne Adult Perc - 05/29/24 1029     Time Antigen Placed 1029    Allergen Manufacturer Jestine    Location Back    Number of Test 55    1. Control-Buffer 50% Glycerol Negative    2. Control-Histamine 2+    3. Bahia Negative    4. French Southern Territories Negative    5. Johnson Negative    6. [place]  Blue Negative    7. Meadow Fescue Negative    8. Perennial Rye Negative    9. Timothy Negative    10. Ragweed Mix Negative    11. Cocklebur Negative    12. Plantain,  English Negative    13. Baccharis Negative    14. Dog Fennel Negative    15. Russian Thistle Negative    16. Lamb's Quarters Negative    17. Sheep Sorrell Negative    18. Rough Pigweed Negative    19. Marsh Elder, Rough Negative     20. Mugwort, Common Negative    21. Box, Elder Negative    22. Cedar, red Negative    23. Sweet Gum Negative    24. Pecan Pollen Negative    25. Pine Mix Negative    26. Walnut, Black Pollen Negative    27. Red Mulberry Negative    28. Ash Mix Negative    29. Birch Mix Negative    30. Beech American Negative    31. Cottonwood, [place] Negative    32. Hickory, White Negative    33. Maple Mix Negative    34. Oak, [place] Mix Negative    35. Sycamore Eastern Negative    36. Alternaria Alternata Negative    37. Cladosporium Herbarum Negative    38. Aspergillus Mix Negative    39. Penicillium Mix Negative    40. Bipolaris Sorokiniana (Helminthosporium) Negative    41. Drechslera Spicifera (Curvularia) Negative    42. Mucor Plumbeus Negative    43. Fusarium Moniliforme Negative    44. Aureobasidium Pullulans (pullulara) Negative    45. Rhizopus Oryzae Negative    46. Botrytis Cinera Negative    47. Epicoccum Nigrum Negative    48. Phoma Betae Negative    49. Dust Mite Mix Negative    50. Cat Hair 10,000 BAU/ml Negative  51.  Dog Epithelia Negative    52. Mixed Feathers Negative    53. Horse Epithelia Negative    54. Cockroach, German Negative    55. Tobacco Leaf Negative          Intradermal - 05/29/24 1105     Time Antigen Placed 1105    Allergen Manufacturer Greer    Location Arm    Number of Test 16    Control Negative    Bahia Negative    French Southern Territories Negative    Johnson Negative    7 Grass Negative    Ragweed Mix Negative    Weed Mix Negative    Tree Mix Negative    Mold 1 Negative    Mold 2 Negative    Mold 3 Negative    Mold 4 Negative    Mite Mix Negative    Cat Negative    Dog Negative    Cockroach Negative           Assessment:   1. Chronic rhinitis     Plan/Recommendations:  Chronic Rhinitis Chronic Cough with Post Nasal Drip  - Due to chronic cough, post nasal drainage and unresponsive to over the counter meds, will perform skin testing  to identify aeroallergen triggers.   - Positive skin test 05/2024: none - Use nasal saline rinses before nose sprays such as with Neilmed Sinus Rinse.  Use distilled water.   - Avoid use of Afrin. If you must use, use in combination with Flonase .  - Use Flonase  2 sprays each nostril daily. Aim upward and outward. - Use Azelastine  2 sprays each nostril twice daily as needed for runny nose, drainage, sneezing, congestion. Aim upward and outward. - Use Ipratroprium 1-2 sprays up to three times daily as needed for runny nose. Aim upward and outward. This can cause dryness.   - Use Zyrtec  10 mg daily.   Chronic Cough:  CHRONIC THROAT CLEARING-WHAT TO DO!!  If present for a long period of time, throat clearing can become a habit. We will work with you to treat any of the medical reasons for throat clearing.  Your job is to help prevent the habit, which can cause damage (redness and swelling) to your vocal cords.  It will require a conscious effort on your behalf.  Tips for prevention of throat clearing:  Instead of clearing your throat, swallow instead.   Carrying around water (or something to drink) will help you move the mucus in the right direction.  IF you have the urge to clear your throat, drink your water. If you absolutely have to clear your throat, use a non-traumatic exercise to do so.   Pant with your mouth open saying "[place], [place], [place]" with a powerful, breathy voice. Increase water intake.  This thins secretions, making them easier to swallow. Chew baking soda gum (ARM & HAMMER) which can help with swallowing, reflux, and throat clearing.  Try to chew up to three times daily.   If you experience jaw pain or headaches, decrease the amount of chewing. Suck on sugar free hard candy to help with swallowing. Have your friends and family remind you to swallow when they hear you throat clearing.  As this can be habit forming, sometimes you may not realize you are doing this.  Having someone point  it out to you, will help you become more conscious of the behavior. BE PATIENT.  This will take time to resolve, and some do not see improvement until 8-12 weeks into  therapy/behavior modifications.    Return in about 3 months (around 08/28/2024).  Arleta Blanch, MD [facility] 

## 2024-06-04 ENCOUNTER — Telehealth: Payer: Self-pay

## 2024-06-04 NOTE — Telephone Encounter (Signed)
 Faxed reenrollment form for Esbriet  to [employer] because of pharmacy transition to [employer] from [employer].  Phone: [phone] Fax: [phone]

## 2024-06-07 NOTE — Telephone Encounter (Signed)
 Received fax from [employer] stating prescriber foundation form is missing date beside provider's signature. Updated the form with the date and faxed back to [employer].

## 2024-06-13 NOTE — Telephone Encounter (Signed)
 Called [employer] to confirm if anything further is needed for patient assistance program enrollment renewal. NFN is needed at this time. Per rep, patient is approved until 07/06/2024. In November 2025, [facility] will be taking over. Patient will not need to reapply until the end of the year. [facility] will reach out to patients for next steps if needed from patient towards end of the year.  Phone: [phone] [facility] phone: [phone]  Sherry Pennant, PharmD, MPH, BCPS, CPP Clinical Pharmacist [facility])

## 2024-06-15 ENCOUNTER — Telehealth (HOSPITAL_COMMUNITY): Payer: Self-pay

## 2024-06-15 NOTE — Telephone Encounter (Signed)
 Attempted f/u call regarding pulmonary rehab- no answer, unable to leave message as mailbox is full. Sent MyChart message.  Closing referral.

## 2024-06-22 NOTE — Progress Notes (Signed)
   06/22/2024  Patient ID: Nadara LELON Spiro, male   DOB: Sep 24, 1945, 78 y.o.   MRN: [id number]  This patient is appearing on a report for being at risk of failing the adherence measure for diabetes medications this calendar year.   Medication: Metformin  ER 500 mg tablet Medication was stopped by PCP based on A1c well below goal of <7%  Andriette KYM Cleaves [facility] PharmD Candidate Class of 2026

## 2024-06-27 ENCOUNTER — Other Ambulatory Visit: Payer: Self-pay | Admitting: Family Medicine

## 2024-07-03 ENCOUNTER — Other Ambulatory Visit: Payer: Self-pay

## 2024-07-03 ENCOUNTER — Other Ambulatory Visit

## 2024-07-03 DIAGNOSIS — J849 Interstitial pulmonary disease, unspecified: Secondary | ICD-10-CM

## 2024-07-03 DIAGNOSIS — Z006 Encounter for examination for normal comparison and control in clinical research program: Secondary | ICD-10-CM

## 2024-07-03 NOTE — Progress Notes (Signed)
 Phinder research study protocol and staff, do not [facility] financial, research will pay.

## 2024-07-04 ENCOUNTER — Other Ambulatory Visit: Payer: Self-pay

## 2024-07-04 DIAGNOSIS — Z006 Encounter for examination for normal comparison and control in clinical research program: Secondary | ICD-10-CM

## 2024-07-04 DIAGNOSIS — J849 Interstitial pulmonary disease, unspecified: Secondary | ICD-10-CM

## 2024-07-04 NOTE — Addendum Note (Signed)
 Addended by: VONZELL KAVA on: 07/04/2024 08:24 AM   Modules accepted: Orders, Level of Service

## 2024-07-05 ENCOUNTER — Encounter

## 2024-07-05 ENCOUNTER — Encounter: Admitting: Pulmonary Disease

## 2024-07-05 ENCOUNTER — Other Ambulatory Visit: Payer: Self-pay

## 2024-07-05 DIAGNOSIS — Z006 Encounter for examination for normal comparison and control in clinical research program: Secondary | ICD-10-CM

## 2024-07-05 DIAGNOSIS — J849 Interstitial pulmonary disease, unspecified: Secondary | ICD-10-CM

## 2024-07-05 NOTE — Research (Deleted)
 TItle: "PHINDER: Pulmonary Hypertension Screening in Patients with Interstitial Lung Disease for Earlier Detection"   Primary Endpoint To prospectively evaluate screening strategies for pulmonary hypertension (PH) in patients with ILD in an effort to promote awareness and encourage screening for PH in this patient population. Results from this study will be used to identify and weigh specific clinical parameters based on their prognostic significance for right heart catheterization (RHC)-confirmed PH in patients with ILD.   Study Code: GMS-PH-001 Protocol Edition: Amendment 1 dated 30 Nov 2021 Sponsor Name: [employer] Primary Investigator: Dr. Adina Hunsucker   Protocol Version for 11/22/2023 date of 30 Nov 2021  Consent Version for 11/22/2023 date of 19 Apr 2022   Lab Manual: v1 dated 13 Nov 2021    Key Inclusion Criteria:  Patients are eligible if they have a diagnosis of ILD based on computed tomography imaging, including:  Idiopathic interstitial pneumonia, including idiopathic pulmonary fibrosis  Connective tissue disease-associated ILD with forced vital capacity (FVC) <70%  Hypersensitivity pneumonitis  Scleroderma-related ILD  Autoimmune ILD  Nonspecific interstitial pneumonia  Occupational lung disease  Combined pulmonary fibrosis and emphysema   Additionally, patients must meet a total of 2 or more criteria from at least 2 distinct categories below (ie, Category 1, Category 2, Category 3) based on the Investigator's clinical judgment, within 180 days of Screening.   Category 1: Clinical tests: a) Pulmonary function tests (PFTs)  meeting specified study guidelines for DLCO. b) Historical HRCT with specified findings consistent with increased pulmonary arterial pressure.   Category 2: Symptoms, oxygenation, exercise capacity, and brain natriuretic peptide/N-terminal pro-brain natriuretic peptide indicative of worsening status. Category 3:  Echocardiography Specified findings of increased RV systolic pressure or RV enlargement.   If a patient does not have a historical PFT, HRCT, or echocardiogram needed to assess eligibility for the study, these procedures will be performed as part of study screening.   Key Exclusion Criteria:  Patients are excluded if any of the following criteria apply:  Prior RHC with mean pulmonary artery pressure >20 mmHg  Currently on an FDA-approved pulmonary arterial hypertension medication  Diagnosed with chronic obstructive pulmonary disease  Uncontrolled or untreated sleep apnea  Pulmonary embolism within the past 3 months  History of ischemic heart disease or left-sided myocardial dysfunction within 12 months of Screening, defined as left ventricular ejection fraction <40% or pulmonary capillary wedge pressure >15 mmHg   Safety Data: Not Applicable.      [facility] [place] Clinical Research Coordinator note:    This visit for Subject Todd Parsons with DOB: 06/05/1963 on 11/22/2023 for the above protocol is Visit/Encounter # screening  and is for purpose of research.    The consent for this encounter is under Protocol Version amendment 1,  Consent Version Revised 14Aug2023 and  is currently IRB approved.    Subject expressed continued interest and consent in continuing as a study subject. Subject confirmed that there was    no change in contact information (e.g. address, telephone, email). Subject thanked for participation in research and contribution to science. In this visit 11/22/2023 the subject will be evaluated by Principal Investigator [facility] . This research coordinator has verified that the above investigator is up to date with his/her training logs.    The Subject was informed that the PI  continues to have oversight of the subject's visits and course through relevant discussions, reviews, and also specifically of this visit by routing of this note to the PI.    1.  This visit  is a key visit of screening. The PI is  available for this visit.     2.  In addition, ahead of the key visit of screeningt the visit and subject were discussed with the PI as part of direct PI oversight.    Consent reviewed, questions answered, and screen visit assessments completed.  Please see subject binder for further details.     Signed by   Octaviano Paras COT, CCRP Clinical Research Coordinator  [facility]  [place], [place]

## 2024-07-05 NOTE — Research (Signed)
 TItle: "PHINDER: Pulmonary Hypertension Screening in Patients with Interstitial Lung Disease for Earlier Detection"   Primary Endpoint To prospectively evaluate screening strategies for pulmonary hypertension (PH) in patients with ILD in an effort to promote awareness and encourage screening for PH in this patient population. Results from this study will be used to identify and weigh specific clinical parameters based on their prognostic significance for right heart catheterization (RHC)-confirmed PH in patients with ILD.   Study Code: GMS-PH-001 Protocol Edition: Amendment 1 dated 30 Nov 2021 Sponsor Name: [employer] Primary Investigator: Dr. Adina Hunsucker   Protocol Version for 11/22/2023 date of 30 Nov 2021  Consent Version for 11/22/2023 date of 19 Apr 2022   Lab Manual: v1 dated 13 Nov 2021    Key Inclusion Criteria:  Patients are eligible if they have a diagnosis of ILD based on computed tomography imaging, including:  Idiopathic interstitial pneumonia, including idiopathic pulmonary fibrosis  Connective tissue disease-associated ILD with forced vital capacity (FVC) <70%  Hypersensitivity pneumonitis  Scleroderma-related ILD  Autoimmune ILD  Nonspecific interstitial pneumonia  Occupational lung disease  Combined pulmonary fibrosis and emphysema   Additionally, patients must meet a total of 2 or more criteria from at least 2 distinct categories below (ie, Category 1, Category 2, Category 3) based on the Investigator's clinical judgment, within 180 days of Screening.   Category 1: Clinical tests: a) Pulmonary function tests (PFTs)  meeting specified study guidelines for DLCO. b) Historical HRCT with specified findings consistent with increased pulmonary arterial pressure.   Category 2: Symptoms, oxygenation, exercise capacity, and brain natriuretic peptide/N-terminal pro-brain natriuretic peptide indicative of worsening status. Category 3:  Echocardiography Specified findings of increased RV systolic pressure or RV enlargement.   If a patient does not have a historical PFT, HRCT, or echocardiogram needed to assess eligibility for the study, these procedures will be performed as part of study screening.   Key Exclusion Criteria:  Patients are excluded if any of the following criteria apply:  Prior RHC with mean pulmonary artery pressure >20 mmHg  Currently on an FDA-approved pulmonary arterial hypertension medication  Diagnosed with chronic obstructive pulmonary disease  Uncontrolled or untreated sleep apnea  Pulmonary embolism within the past 3 months  History of ischemic heart disease or left-sided myocardial dysfunction within 12 months of Screening, defined as left ventricular ejection fraction <40% or pulmonary capillary wedge pressure >15 mmHg   Safety Data: Not Applicable.      [facility] [place] Clinical Research Coordinator note:    This visit for Subject Todd Parsons with DOB: 06/05/1963 on 11/22/2023 for the above protocol is Visit/Encounter # screening  and is for purpose of research.    The consent for this encounter is under Protocol Version amendment 1,  Consent Version Revised 14Aug2023 and  is currently IRB approved.    Subject expressed continued interest and consent in continuing as a study subject. Subject confirmed that there was    no change in contact information (e.g. address, telephone, email). Subject thanked for participation in research and contribution to science. In this visit 11/22/2023 the subject will be evaluated by Principal Investigator [facility] . This research coordinator has verified that the above investigator is up to date with his/her training logs.    The Subject was informed that the PI  continues to have oversight of the subject's visits and course through relevant discussions, reviews, and also specifically of this visit by routing of this note to the PI.    1.  This visit  is a key visit of screening. The PI is  available for this visit.     2.  In addition, ahead of the key visit of screeningt the visit and subject were discussed with the PI as part of direct PI oversight.    Consent reviewed, questions answered, and screen visit assessments completed.  Please see subject binder for further details.     Signed by   Octaviano Paras COT, CCRP Clinical Research Coordinator  [facility]  [place], [place]

## 2024-07-11 ENCOUNTER — Other Ambulatory Visit (HOSPITAL_BASED_OUTPATIENT_CLINIC_OR_DEPARTMENT_OTHER)

## 2024-07-11 ENCOUNTER — Ambulatory Visit (HOSPITAL_BASED_OUTPATIENT_CLINIC_OR_DEPARTMENT_OTHER)
Admission: RE | Admit: 2024-07-11 | Discharge: 2024-07-11 | Disposition: A | Discharge status: Home / Self Care | Source: Ambulatory Visit | Attending: Pulmonary Disease | Admitting: Pulmonary Disease

## 2024-07-11 DIAGNOSIS — I7122 Aneurysm of the aortic arch, without rupture: Secondary | ICD-10-CM | POA: Insufficient documentation

## 2024-07-11 DIAGNOSIS — I700 Atherosclerosis of aorta: Secondary | ICD-10-CM | POA: Insufficient documentation

## 2024-07-11 DIAGNOSIS — I2510 Atherosclerotic heart disease of native coronary artery without angina pectoris: Secondary | ICD-10-CM | POA: Insufficient documentation

## 2024-07-11 DIAGNOSIS — Z006 Encounter for examination for normal comparison and control in clinical research program: Secondary | ICD-10-CM | POA: Insufficient documentation

## 2024-07-11 DIAGNOSIS — J849 Interstitial pulmonary disease, unspecified: Secondary | ICD-10-CM | POA: Insufficient documentation

## 2024-07-24 ENCOUNTER — Other Ambulatory Visit: Payer: Self-pay | Admitting: Internal Medicine

## 2024-07-24 DIAGNOSIS — Z006 Encounter for examination for normal comparison and control in clinical research program: Secondary | ICD-10-CM

## 2024-07-25 ENCOUNTER — Ambulatory Visit (HOSPITAL_COMMUNITY)
Admission: RE | Admit: 2024-07-25 | Discharge: 2024-07-25 | Disposition: A | Discharge status: Home / Self Care | Source: Ambulatory Visit | Attending: Pulmonary Disease | Admitting: Pulmonary Disease

## 2024-07-25 DIAGNOSIS — Z006 Encounter for examination for normal comparison and control in clinical research program: Secondary | ICD-10-CM

## 2024-07-25 LAB — ECHOCARDIOGRAM COMPLETE
AR max vel: 1.92 cm2
AV Area VTI: 1.93 cm2
AV Area mean vel: 1.88 cm2
AV Mean grad: 3 mmHg
AV Peak grad: 6 mmHg
Ao pk vel: 1.22 m/s
Area-P 1/2: 3.77 cm2
Calc EF: 47.1 %
MV VTI: 3.7 cm2
S' Lateral: 2.9 cm
Single Plane A2C EF: 53.7 %
Single Plane A4C EF: 37.8 %

## 2024-07-26 ENCOUNTER — Telehealth: Payer: Self-pay | Admitting: Internal Medicine

## 2024-07-26 NOTE — Telephone Encounter (Signed)
     Todd Parsons

## 2024-07-27 ENCOUNTER — Ambulatory Visit: Payer: Self-pay | Admitting: Internal Medicine

## 2024-07-27 NOTE — Telephone Encounter (Signed)
  REQUEST RIGHT HEART CATH VIA PHINDER STUDY    - Dear Todd Parsons. Todd Parsons and Todd Parsons  Todd Parsons is a patient of Dr Theophilus with ILD. 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 He has met screening criteira and eligibility for RHC via [facility]. PEr Arne Mulberry the cross covering coordinator the cath needs to happen per protocol between  26Nov2025 to 08Dec2025. He is comgin for a research exam on 07/31/24 so anytime after that. So best between 08/01/24 - 08/13/24   Hopefully you can manage. The docs on delegation for this cath are Rolan and Bensimohn  Thanks so much  MR xxxx CC: Study team + Dr Husnsucker (PI) and Dr Theophilus (Referring doc)       xxxx    IMPRESSIONS      1. Left ventricular ejection fraction, by estimation, is 55 to 60%. Left ventricular ejection fraction by PLAX is 61 %. The left ventricle has normal function. The left ventricle has no regional wall motion abnormalities. There is moderate concentric  left ventricular hypertrophy. Left ventricular diastolic parameters are consistent with Grade I diastolic dysfunction (impaired relaxation). There is the interventricular septum is flattened in systole, consistent with right ventricular pressure  overload.  2. TR doppler signal now well visualized preventing assessment of RVSP. Right ventricular systolic function is moderately reduced. The right ventricular size is normal.  3. The mitral valve is normal in structure. Mild mitral valve regurgitation. No evidence of mitral stenosis.  4. The aortic valve is tricuspid. Aortic valve regurgitation is not visualized. No aortic stenosis is present. Aortic valve area, by VTI measures 1.93 cm. Aortic valve mean gradient measures 3.0 mmHg. Aortic valve Vmax measures 1.22 m/s.  5. 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 There is a 29 mm Medtronic Freestyle root in place that appears normal. There is a 28 mm Hemashield graft in place of the ascending aorta which appears normal.  6.  The inferior vena cava is normal in size with greater than 50% respiratory variability, suggesting right atrial pressure of 3 mmHg.   Comparison(s): Prior images unable to be directly viewed, comparison made by report only.   Conclusion(s)/Recommendation(s): Normal LV systolic function. The RV systolic function appears at least moderately reduced. There is evidene of RV pressure overload by the abnormal septal flattening. Recommend right heart catheterization to better  understand pulmonary pressures if clinically indicated.

## 2024-07-31 ENCOUNTER — Encounter: Admitting: Pulmonary Disease

## 2024-07-31 DIAGNOSIS — Z006 Encounter for examination for normal comparison and control in clinical research program: Secondary | ICD-10-CM

## 2024-08-01 NOTE — Progress Notes (Addendum)
 Todd Parsons, DOB 1946-06-12, was seen as subject in a clinical trial /Protocol # GMS-PH-001 Cardiopulmonary symptoms have exhibited a slow decline since his involvement in pulmonary rehab 18 months ago.  This is associated with increased fatigue and dyspnea on exertion after walking 1 flight.  After 2 flights he must sit and rest.  He also has a nonproductive cough which is essentially unchanged.   His wife is concerned that he is only using his oxygen  during the day.  She states that he does snore; she has not observed any apnea.  She states that he does take several naps during the day. He states that he has lost 60 pounds in the last 18 months which he attributes to medication adverse effect, specifically Esbriet .  Other than constipation; he denies any GI symptoms in the context of the weight loss. Pertinent physical findings include temporal wasting and interosseous wasting in the hands.  Rales are present over the lower two thirds of the posterior thorax.  There is marked increase in the second heart sound.  Intermittent pauses in the rhythm are present.  Aorta is palpable without definite aortic aneurysm.  No bruit was present. All physical findings NCS. In reference to his wife's concerns; I recommended that she apply the pulse oximeter while he is sleeping to see if there is significant O2 desaturation and report findings to his Pulmonologist.                                                                                                                                                                                                               Elsie JULIANNA Roses MD,SI

## 2024-08-01 NOTE — H&P (View-Only) (Signed)
 Todd Parsons, DOB 1946-06-12, was seen as subject in a clinical trial /Protocol # GMS-PH-001 Cardiopulmonary symptoms have exhibited a slow decline since his involvement in pulmonary rehab 18 months ago.  This is associated with increased fatigue and dyspnea on exertion after walking 1 flight.  After 2 flights he must sit and rest.  He also has a nonproductive cough which is essentially unchanged.   His wife is concerned that he is only using his oxygen  during the day.  She states that he does snore; she has not observed any apnea.  She states that he does take several naps during the day. He states that he has lost 60 pounds in the last 18 months which he attributes to medication adverse effect, specifically Esbriet .  Other than constipation; he denies any GI symptoms in the context of the weight loss. Pertinent physical findings include temporal wasting and interosseous wasting in the hands.  Rales are present over the lower two thirds of the posterior thorax.  There is marked increase in the second heart sound.  Intermittent pauses in the rhythm are present.  Aorta is palpable without definite aortic aneurysm.  No bruit was present. All physical findings NCS. In reference to his wife's concerns; I recommended that she apply the pulse oximeter while he is sleeping to see if there is significant O2 desaturation and report findings to his Pulmonologist.                                                                                                                                                                                                               Elsie JULIANNA Roses MD,SI

## 2024-08-01 NOTE — Research (Signed)
 TItle: "PHINDER: Pulmonary Hypertension Screening in Patients with Interstitial Lung Disease for Earlier Detection"   Primary Endpoint: To prospectively evaluate screening strategies for pulmonary hypertension (PH) in patients with ILD in an effort to promote awareness and encourage screening for PH in this patient population. Results from this study will be used to identify and weigh specific clinical parameters based on their prognostic significance for right heart catheterization (RHC)-confirmed PH in patients with ILD.   Study Code: GMS-PH-001   CRC checked for correct versions: Yes    Protocol Version: Amendment 2 dated 30May2025 IB: N/A ICF: [employer] Approved Version 22 Feb 2024, Revised 18Jun2025 Lab Manual: v1.0 dated 21Apr2023   Key Inclusion Criteria:  Patients are eligible if they have a diagnosis of ILD based on computed tomography imaging, including:  Idiopathic interstitial pneumonia, including idiopathic pulmonary fibrosis  Connective tissue disease-associated ILD with forced vital capacity (FVC) <70%  Hypersensitivity pneumonitis  Scleroderma-related ILD  Autoimmune ILD  Nonspecific interstitial pneumonia  Occupational lung disease  Combined pulmonary fibrosis and emphysema   Additionally, patients must meet a total of 2 or more criteria from at least 2 distinct categories below (ie, Category 1, Category 2, Category 3) based on the Investigator's clinical judgment, within 180 days of Screening.   Category 1: Clinical tests: a) Pulmonary function tests (PFTs)  meeting specified study guidelines for DLCO. b) Historical HRCT with specified findings consistent with increased pulmonary arterial pressure.   Category 2: Symptoms, oxygenation, exercise capacity, and brain natriuretic peptide/N-terminal pro-brain natriuretic peptide indicative of worsening status.   Category 3: Echocardiography Specified findings of increased RV systolic pressure or RV enlargement.   If  a patient does not have a historical PFT, HRCT, or echocardiogram needed to assess eligibility for the study, these procedures will be performed as part of study screening.   Key Exclusion Criteria:  Patients are excluded if any of the following criteria apply:  Prior RHC with mean pulmonary artery pressure >20 mmHg  Currently on an FDA-approved pulmonary arterial hypertension medication  Diagnosed with chronic obstructive pulmonary disease  Uncontrolled or untreated sleep apnea  Pulmonary embolism within the past 3 months  History of ischemic heart disease or left-sided myocardial dysfunction within 12 months of Screening, defined as left ventricular ejection fraction <40% or pulmonary capillary wedge pressure >15 mmHg   Safety Data: Not Applicable.     [facility] [place] Clinical Research Coordinator note:    This visit for Subject Todd Parsons with DOB: 08-29-1946 on 07/31/2024 for the above protocol is Encounter # 2 and is for purpose of research.  The Subject ID for the study is [id number]   Subject expressed continued interest and consent in continuing as a study subject. Subject confirmed that there was no change in contact information (e.g. address, telephone, email). Subject thanked for participation in research and contribution to science. In this visit on 07/31/2024 the subject will be evaluated by Sub-Investigator named Dr. Elsie Roses     The Subject was informed that the PI Dr. Adina Beals continues to have oversight of the subject's visits and course through relevant discussions, reviews, and also specifically of this visit by routing of this note to the PI.  The PI is not available for this visit. Because the PI is not available, the Sub-Investigator reported and CRC has confirmed that the PI has discussed the visit a-priori with the Sub-Investigator.   All questions and concerns were addressed and answered during the visit. All applicable screening visit assessments were  completed. Please see subject binder for further details.   Signed by   Octaviano Paras Clinical Research Coordinator [facility]  [place], [place]

## 2024-08-06 ENCOUNTER — Other Ambulatory Visit (HOSPITAL_COMMUNITY): Payer: Self-pay | Admitting: *Deleted

## 2024-08-06 DIAGNOSIS — J849 Interstitial pulmonary disease, unspecified: Secondary | ICD-10-CM

## 2024-08-06 DIAGNOSIS — Z006 Encounter for examination for normal comparison and control in clinical research program: Secondary | ICD-10-CM

## 2024-08-06 NOTE — Telephone Encounter (Signed)
 Spoke w/pt to sch RHC, he ask I call his wife Rojelio, spoke w/her, RHC sch for Fri 12/5 at 12 pm with Dr Cherrie, instructions reviewed and sent via mychart.

## 2024-08-07 ENCOUNTER — Telehealth: Payer: Self-pay | Admitting: Family Medicine

## 2024-08-07 DIAGNOSIS — N3281 Overactive bladder: Secondary | ICD-10-CM

## 2024-08-07 NOTE — Telephone Encounter (Signed)
 Copied from CRM [id number]. Topic: Referral - Request for Referral >> Aug 07, 2024  4:30 PM Tobias L wrote: Did the patient discuss referral with their provider in the last year? No (If No - schedule appointment) (If Yes - send message)  Appointment offered? No  Type of order/referral and detailed reason for visit: Urology - Was seen at urologist at [facility], however they are unable to continue seeing the urologist. Wife requesting Dr. Alvan to send referral for urologist.   Preference of office, provider, location: [facility]   If referral order, have you been seen by this specialty before? Yes Urology department at [facility]  Can we respond through MyChart? No

## 2024-08-09 ENCOUNTER — Ambulatory Visit: Admitting: Family Medicine

## 2024-08-09 NOTE — Telephone Encounter (Signed)
 Orders Placed This Encounter  Procedures  . Ambulatory referral to Urology    Referral Priority:   Routine    Referral Type:   Consultation    Referral Reason:   Specialty Services Required    Requested Specialty:   Urology    Number of Visits Requested:   1

## 2024-08-10 ENCOUNTER — Other Ambulatory Visit: Payer: Self-pay

## 2024-08-10 ENCOUNTER — Ambulatory Visit (HOSPITAL_COMMUNITY)
Admission: RE | Admit: 2024-08-10 | Discharge: 2024-08-10 | Disposition: A | Discharge status: Home / Self Care | Payer: Self-pay | Attending: Internal Medicine | Admitting: Internal Medicine

## 2024-08-10 ENCOUNTER — Encounter (HOSPITAL_COMMUNITY): Admission: RE | Disposition: A | Payer: Self-pay | Attending: Internal Medicine

## 2024-08-10 DIAGNOSIS — J849 Interstitial pulmonary disease, unspecified: Secondary | ICD-10-CM | POA: Diagnosis present

## 2024-08-10 DIAGNOSIS — Z79899 Other long term (current) drug therapy: Secondary | ICD-10-CM | POA: Insufficient documentation

## 2024-08-10 DIAGNOSIS — Z006 Encounter for examination for normal comparison and control in clinical research program: Secondary | ICD-10-CM

## 2024-08-10 LAB — CBC
HCT: 46.6 % (ref 39.0–52.0)
Hemoglobin: 14.9 g/dL (ref 13.0–17.0)
MCH: 29.2 pg (ref 26.0–34.0)
MCHC: 32 g/dL (ref 30.0–36.0)
MCV: 91.4 fL (ref 80.0–100.0)
Platelets: 217 K/uL (ref 150–400)
RBC: 5.1 MIL/uL (ref 4.22–5.81)
RDW: 13.6 % (ref 11.5–15.5)
WBC: 8.4 K/uL (ref 4.0–10.5)
nRBC: 0 % (ref 0.0–0.2)

## 2024-08-10 LAB — POCT I-STAT EG7
Acid-Base Excess: 4 mmol/L — ABNORMAL HIGH (ref 0.0–2.0)
Acid-Base Excess: 5 mmol/L — ABNORMAL HIGH (ref 0.0–2.0)
Bicarbonate: 31.1 mmol/L — ABNORMAL HIGH (ref 20.0–28.0)
Bicarbonate: 31.5 mmol/L — ABNORMAL HIGH (ref 20.0–28.0)
Calcium, Ion: 1.29 mmol/L (ref 1.15–1.40)
Calcium, Ion: 1.31 mmol/L (ref 1.15–1.40)
HCT: 46 % (ref 39.0–52.0)
HCT: 46 % (ref 39.0–52.0)
Hemoglobin: 15.6 g/dL (ref 13.0–17.0)
Hemoglobin: 15.6 g/dL (ref 13.0–17.0)
O2 Saturation: 63 %
O2 Saturation: 66 %
Potassium: 3.6 mmol/L (ref 3.5–5.1)
Potassium: 3.7 mmol/L (ref 3.5–5.1)
Sodium: 139 mmol/L (ref 135–145)
Sodium: 140 mmol/L (ref 135–145)
TCO2: 33 mmol/L — ABNORMAL HIGH (ref 22–32)
TCO2: 33 mmol/L — ABNORMAL HIGH (ref 22–32)
pCO2, Ven: 52.5 mmHg (ref 44–60)
pCO2, Ven: 52.7 mmHg (ref 44–60)
pH, Ven: 7.381 (ref 7.25–7.43)
pH, Ven: 7.385 (ref 7.25–7.43)
pO2, Ven: 34 mmHg (ref 32–45)
pO2, Ven: 35 mmHg (ref 32–45)

## 2024-08-10 LAB — BASIC METABOLIC PANEL WITH GFR
Anion gap: 10 (ref 5–15)
BUN: 14 mg/dL (ref 8–23)
CO2: 28 mmol/L (ref 22–32)
Calcium: 9.2 mg/dL (ref 8.9–10.3)
Chloride: 103 mmol/L (ref 98–111)
Creatinine, Ser: 0.91 mg/dL (ref 0.61–1.24)
GFR, Estimated: >60 mL/min (ref >60)
Glucose, Bld: 111 mg/dL — ABNORMAL HIGH (ref 70–99)
Potassium: 3.8 mmol/L (ref 3.5–5.1)
Sodium: 141 mmol/L (ref 135–145)

## 2024-08-10 LAB — GLUCOSE, CAPILLARY
Glucose-Capillary: 113 mg/dL — ABNORMAL HIGH (ref 70–99)
Glucose-Capillary: 101 mg/dL — ABNORMAL HIGH (ref 70–99)

## 2024-08-10 SURGERY — RIGHT HEART CATH
Anesthesia: LOCAL

## 2024-08-10 MED ORDER — ACETAMINOPHEN 325 MG PO TABS: 650.0000 mg | ORAL_TABLET | ORAL | Status: DC | PRN

## 2024-08-10 MED ORDER — SODIUM CHLORIDE 0.9% FLUSH: 3.0000 mL | Freq: Two times a day (BID) | INTRAVENOUS | Status: DC

## 2024-08-10 MED ORDER — FREE WATER: 250.0000 mL | Freq: Once | Status: DC

## 2024-08-10 MED ORDER — SODIUM CHLORIDE 0.9 % IV SOLN: 250.0000 mL | INTRAVENOUS | Status: DC | PRN

## 2024-08-10 MED ORDER — HYDRALAZINE HCL 20 MG/ML IJ SOLN: 10.0000 mg | INTRAMUSCULAR | Status: DC | PRN

## 2024-08-10 MED ORDER — LIDOCAINE HCL (PF) 1 % IJ SOLN
INTRAMUSCULAR | Status: DC | PRN
  Administered 2024-08-10: 2 mL

## 2024-08-10 MED ORDER — HEPARIN (PORCINE) IN NACL 1000-0.9 UT/500ML-% IV SOLN
INTRAVENOUS | Status: DC | PRN
  Administered 2024-08-10: 500 mL

## 2024-08-10 MED ORDER — LABETALOL HCL 5 MG/ML IV SOLN: 10.0000 mg | INTRAVENOUS | Status: DC | PRN

## 2024-08-10 MED ORDER — ONDANSETRON HCL 4 MG/2ML IJ SOLN: 4.0000 mg | Freq: Four times a day (QID) | INTRAMUSCULAR | Status: DC | PRN

## 2024-08-10 MED ORDER — LIDOCAINE HCL (PF) 1 % IJ SOLN
INTRAMUSCULAR | Status: AC
  Filled 2024-08-10: qty 30

## 2024-08-10 MED ORDER — SODIUM CHLORIDE 0.9% FLUSH: 3.0000 mL | INTRAVENOUS | Status: DC | PRN

## 2024-08-10 SURGICAL SUPPLY — 6 items
CATH SWAN GANZ 7F STRAIGHT (CATHETERS) IMPLANT
GLIDESHEATH SLENDER 7FR .021G (SHEATH) IMPLANT
GUIDEWIRE .025 260CM (WIRE) IMPLANT
PACK CARDIAC CATHETERIZATION (CUSTOM PROCEDURE TRAY) ×1 IMPLANT
TRANSDUCER W/STOPCOCK (MISCELLANEOUS) IMPLANT
TUBING ART PRESS 72 MALE/FEM (TUBING) IMPLANT

## 2024-08-10 NOTE — Discharge Instructions (Signed)

## 2024-08-10 NOTE — Interval H&P Note (Signed)
 History and Physical Interval Note:  08/10/2024 11:23 AM  Todd Parsons  has presented today for surgery, with the diagnosis of IOD.  The various methods of treatment have been discussed with the patient and family. After consideration of risks, benefits and other options for treatment, the patient has consented to  Procedure(s): RIGHT HEART CATH (N/A) as a surgical intervention.  The patient's history has been reviewed, patient examined, no change in status, stable for surgery.  I have reviewed the patient's chart and labs.  Questions were answered to the patient's satisfaction.     Wiley Magan

## 2024-08-12 ENCOUNTER — Encounter (HOSPITAL_COMMUNITY): Payer: Self-pay | Admitting: Internal Medicine

## 2024-08-16 ENCOUNTER — Ambulatory Visit: Admitting: Nurse Practitioner

## 2024-08-28 ENCOUNTER — Ambulatory Visit: Admitting: Internal Medicine

## 2024-09-04 ENCOUNTER — Other Ambulatory Visit: Payer: Self-pay | Admitting: Family Medicine

## 2024-09-04 ENCOUNTER — Ambulatory Visit: Payer: Self-pay | Admitting: Family Medicine

## 2024-09-04 MED ORDER — PAROXETINE HCL 10 MG PO TABS: 10.0000 mg | ORAL_TABLET | Freq: Every day | ORAL | 1 refills | Status: AC

## 2024-09-04 MED ORDER — MIRTAZAPINE 7.5 MG PO TABS: 7.5000 mg | ORAL_TABLET | Freq: Every day | ORAL | 1 refills | Status: AC

## 2024-09-04 NOTE — Telephone Encounter (Signed)
 Okay, I am going to send over prescription called Remeron to take at bedtime will start with a low dose.  I do want him to decrease the Paxil  since Ragona add this 1 instead so okay to cut the Paxil  in half and take a half a tab daily.  I am also going to send over the 10 mg dose if they would prefer to do it that way instead of splitting it.  Lets set up a weight check in about 4 weeks

## 2024-09-04 NOTE — Telephone Encounter (Signed)
 FYI Only or Action Required?: Action required by provider: medication request.  Patient was last seen in primary care on 05/08/2024 by Alvan Dorothyann BIRCH, MD.  Called Nurse Triage reporting Medication Assistance.    Patient/caregiver understands and will follow disposition?: YesCopied from CRM [id number]. Topic: Clinical - Medication Question >> Sep 04, 2024 12:34 PM Todd Parsons: Reason for CRM: wife called because patient weight is 138 and was informed to call once it below 150 pounds. Wife would like to know if the provider can send in appetite enhancers   [facility] [place], [place] - [address] [place] [address] Phone: [phone] Fax: [phone] Reason for Disposition  Prescription request for new medicine (not a refill)  Answer Assessment - Initial Assessment Questions Patient's wife called to request prescription for appetite enhancer be called into pharmacy for husband stating that he currently weighs  138 pounds.   Request for medication to be sent to  [facility] [place], [place] - [address] [place] [address] [place] [address] Phone: [phone] Fax: [phone]  Protocols used: Medication Question Call-A-AH

## 2024-09-04 NOTE — Telephone Encounter (Signed)
 Patient informed and scheduled for weight check in 4 weeks.

## 2024-09-17 ENCOUNTER — Telehealth: Payer: Self-pay

## 2024-09-17 NOTE — Telephone Encounter (Signed)
 Copied from CRM #[id number]. Topic: Clinical - Medication Question >> Sep 17, 2024  2:17 PM Mercer PEDLAR wrote: Reason for CRM: Patient is requesting a callback from [facility] Dr. Vernida nurse to discuss his medication.

## 2024-09-19 MED ORDER — FLUTICASONE PROPIONATE 50 MCG/ACT NA SUSP: 2.0000 | Freq: Every day | NASAL | 5 refills | Status: AC

## 2024-09-19 NOTE — Addendum Note (Signed)
 Addended by: FREYA BASCOM CROME on: 09/19/2024 02:04 PM   Modules accepted: Orders

## 2024-09-19 NOTE — Telephone Encounter (Signed)
 Spoke with pt and he wanted refill of nasal spray sent to local pharmacy.   While on the phone his wife inquired about the Mirtazapine  7.5 mg she stated that his weight is at 138 lbs and questions if the medication is helping or if it is his other depressant medication that could be causing him not to put on/keep weight on.   Advised her that I would fwd to Dr. Alvan for advice.

## 2024-09-20 NOTE — Telephone Encounter (Signed)
 He has an appointment in about a week we can discuss the weight management issue at that point in time that is part of why that appointment has been scheduled.  Likely will end up increasing his dose but I want to see how he is weighing on our scale when they come back

## 2024-09-21 ENCOUNTER — Telehealth: Payer: Self-pay

## 2024-09-21 NOTE — Telephone Encounter (Signed)
 Copied from CRM [id number]. Topic: Clinical - Medication Question >> Sep 21, 2024  8:32 AM Benton KIDD wrote: Reason for CRM: patient wife is calling because  patient is losing weight has lost like 60 pounds from taking ESBRIET  267 MG TABS. Patient wife beverly doesn't want patient to lose any more weight . Patient is barley eating and his primary care put him on a appetite enhancer and that hasn't worked . Patient wife is wanting to know what to do  He is now a 138 pounds please give patient wife a call concerning this  [phone]

## 2024-09-25 NOTE — Telephone Encounter (Signed)
 Called patient/spouse to discuss -   Spouse, Todd Parsons, answers the phone. Patient was upstairs resting at the time of my call, so she discusses concerns about pirfenidone  with me.   She reports ongoing, significant side effects of pirfenidone  impacting his quality of life. He naps 3 times per day and feels too tired to sit at the table with family at family events. She notes significant decline in appetite, noting that his PCP recommended an appetite enhancer if he reaches 150 lbs or less. He has used mirtazapine  since December but she notes no improvement. Currently weighs 138 lbs per last home weight. Last office weight 145 lbs on 08/10/24.   He has been on Esbriet  since 2022. Dose lowered June 2023 due to side effects.   At last OV with Dr. Theophilus on 05/23/24, notes suggest: Monitor your weight closely. If weight loss continues, we may switch to a different medication called nintedanib, but be aware it can cause diarrhea. At this time, spouse denies issues with diarrhea but reports patient has constipation. Next OV with Dr. Theophilus scheduled for March.   PharmD will message Dr. Theophilus for recommendation on alternative medication and/or scheduling sooner visit. I advised spouse that she will hear from someone with our office.

## 2024-09-27 NOTE — Telephone Encounter (Signed)
 Lets try Todd Parsons as it has lesser side effects.   [facility]-can you please call the patient and make a clinic appointment at next available time to discuss alternate medication.

## 2024-09-27 NOTE — Telephone Encounter (Signed)
 Called- apt scheduled for next available. nfn

## 2024-09-28 NOTE — Telephone Encounter (Signed)
 Pharmacy team will initiate benefits investigation for Jascayd after time of OV with Dr. Theophilus on 10/08/24 so that Jascayd can be discussed between patient and Dr. Theophilus first.

## 2024-10-04 ENCOUNTER — Ambulatory Visit: Admitting: Family Medicine

## 2024-10-05 ENCOUNTER — Ambulatory Visit: Payer: Self-pay | Admitting: Pulmonary Disease

## 2024-10-05 NOTE — Telephone Encounter (Signed)
 FYI Only or Action Required?: FYI only for provider: appointment scheduled on 2/3.  Patient is followed in Pulmonology for Pulm Fibrosis, last seen on 07/31/2024 by Hunsucker, Donnice SAUNDERS, MD.  Called Nurse Triage reporting Shortness of Breath.  Symptoms began about a month ago.  Interventions attempted: Home oxygen  use and Increased fluids/rest.  Symptoms are: gradually worsening.  Triage Disposition: See HCP Within 4 Hours (Or PCP Triage)  Patient/caregiver understands and will follow disposition?: No, wishes to speak with PCP  E2C2 Pulmonary Triage - Initial Assessment Questions Chief Complaint (e.g., cough, sob, wheezing, fever, chills, sweat or additional symptoms) *Go to specific symptom protocol after initial questions. Worsened SOB   How long have symptoms been present? A month   Have you tested for COVID or Flu? Note: If not, ask patient if a home test can be taken. If so, instruct patient to call back for positive results. No  MEDICINES:   Have you used any OTC meds to help with symptoms? No If yes, ask What medications? na  Have you used your inhalers/maintenance medication? No If yes, What medications? na  If inhaler, ask How many puffs and how often? Note: Review instructions on medication in the chart. na  OXYGEN : Do you wear supplemental oxygen ? Yes If yes, How many liters are you supposed to use? Intermittent-- 2L 3-4 times daily   Do you monitor your oxygen  levels? Yes If yes, What is your reading (oxygen  level) today? 88-90% and uses intermittent O2   What is your usual oxygen  saturation reading?  (Note: Pulmonary O2 sats should be 90% or greater) 90%   Message from Penton F sent at 10/05/2024  4:02 PM EST  Reason for Triage: SOB on oxygen  as needed.   Pt has an appt scheduled for 10/09/2024 at 9am with NP Almarie Ferrari, but his wife Bev is calling to see about rescheduling to a later date in this upcoming week via VV or in  person visit.  There was no availability until mid late February with Dr. Theophilus and the middle of March for NP Almarie Ferrari. When I attempted to see about scheduling a VV, Bev mentioned the pt has been having increase in agitation, sleeping, weight loss (10lbs in 4 weeks), and increase in SOB. Pt is being winded when walking up steps and needs to sit down for a bit.   Reason for Disposition  [1] Longstanding difficulty breathing (e.g., CHF, COPD, emphysema) AND [2] WORSE than normal  Answer Assessment - Initial Assessment Questions Patient stopped Esbriet  due continued weight loss. Had appt 2/2 to reassess and discuss alternative.  States he is having worsened breathing over the last month. Sob with activity. He has to use his intermittent O2 3-4times a day. Denies CP, Fever, Dizziness. Wife reports confusion and agitation- upon clarification pt more irritable and moody/agitated since stopping Esbriet .  Stopped Paxel at same time cold turkey unbeknownst to wife. Advised this might be contributing to his mood changes.   Advised appt actually 2/3 at 0900- patient requesting later in the afternoon with Dr Theophilus. Appt switched and pt will go to ED of UC over the weekend if oxygen  doesn't respond to oxygen  therapies   1. RESPIRATORY STATUS: Describe your breathing? (e.g., wheezing, shortness of breath, unable to speak, severe coughing)      SOB  2. ONSET: When did this breathing problem begin?      Last month  3. PATTERN Does the difficult breathing come and go, or has it been constant  since it started?      Comes and goes but worse with activity over the last month 4. SEVERITY: How bad is your breathing? (e.g., mild, moderate, severe)      moderate 5. RECURRENT SYMPTOM: Have you had difficulty breathing before? If Yes, ask: When was the last time? and What happened that time?      Pulm fibrosis 6. CARDIAC HISTORY: Do you have any history of heart disease? (e.g., heart attack,  angina, bypass surgery, angioplasty)      HTN, DM  7. LUNG HISTORY: Do you have any history of lung disease?  (e.g., pulmonary embolus, asthma, emphysema)     Pulm fibrosis, former smoker 8. CAUSE: What do you think is causing the breathing problem?      Stopped Esbriet   9. OTHER SYMPTOMS: Do you have any other symptoms? (e.g., chest pain, cough, dizziness, fever, runny nose)     Irritable/mood changes 10. O2 SATURATION MONITOR:  Do you use an oxygen  saturation monitor (pulse oximeter) at home? If Yes, ask: What is your reading (oxygen  level) today? What is your usual oxygen  saturation reading? (e.g., 95%)       88-90% and using intermittent home o2  Protocols used: Breathing Difficulty-A-AH

## 2024-10-08 ENCOUNTER — Ambulatory Visit: Payer: Self-pay | Admitting: Pulmonary Disease

## 2024-10-08 NOTE — Telephone Encounter (Signed)
 Appt made for pt for 10/09/2024

## 2024-10-09 ENCOUNTER — Encounter: Payer: Self-pay | Admitting: Pulmonary Disease

## 2024-10-09 ENCOUNTER — Ambulatory Visit: Payer: Medicare (Managed Care) | Admitting: Pulmonary Disease

## 2024-10-09 ENCOUNTER — Ambulatory Visit: Payer: Self-pay | Admitting: Primary Care

## 2024-10-09 VITALS — BP 152/92 | HR 62 | Temp 98.3°F | Ht 65.0 in | Wt 147.0 lb

## 2024-10-09 DIAGNOSIS — Z5181 Encounter for therapeutic drug level monitoring: Secondary | ICD-10-CM

## 2024-10-09 DIAGNOSIS — J84112 Idiopathic pulmonary fibrosis: Secondary | ICD-10-CM

## 2024-10-09 LAB — CBC WITH DIFFERENTIAL/PLATELET
Basophils Absolute: 0 10*3/uL (ref 0.0–0.1)
Basophils Relative: 0.3 % (ref 0.0–3.0)
Eosinophils Absolute: 0.1 10*3/uL (ref 0.0–0.7)
Eosinophils Relative: 1.8 % (ref 0.0–5.0)
HCT: 48 % (ref 39.0–52.0)
Hemoglobin: 15.7 g/dL (ref 13.0–17.0)
Lymphocytes Relative: 17.7 % (ref 12.0–46.0)
Lymphs Abs: 1.3 10*3/uL (ref 0.7–4.0)
MCHC: 32.7 g/dL (ref 30.0–36.0)
MCV: 92.2 fl (ref 78.0–100.0)
Monocytes Absolute: 0.8 10*3/uL (ref 0.1–1.0)
Monocytes Relative: 11.1 % (ref 3.0–12.0)
Neutro Abs: 5.2 10*3/uL (ref 1.4–7.7)
Neutrophils Relative %: 69.1 % (ref 43.0–77.0)
Platelets: 174 10*3/uL (ref 150.0–400.0)
RBC: 5.21 Mil/uL (ref 4.22–5.81)
RDW: 16.1 % — ABNORMAL HIGH (ref 11.5–15.5)
WBC: 7.5 10*3/uL (ref 4.0–10.5)

## 2024-10-09 LAB — COMPREHENSIVE METABOLIC PANEL WITH GFR
ALT: 19 U/L (ref 3–53)
AST: 27 U/L (ref 5–37)
Albumin: 3.6 g/dL (ref 3.5–5.2)
Alkaline Phosphatase: 54 U/L (ref 39–117)
BUN: 29 mg/dL — ABNORMAL HIGH (ref 6–23)
CO2: 34 meq/L — ABNORMAL HIGH (ref 19–32)
Calcium: 9.4 mg/dL (ref 8.4–10.5)
Chloride: 100 meq/L (ref 96–112)
Creatinine, Ser: 0.97 mg/dL (ref 0.40–1.50)
GFR: 74.23 mL/min
Glucose, Bld: 125 mg/dL — ABNORMAL HIGH (ref 70–99)
Potassium: 4.4 meq/L (ref 3.5–5.1)
Sodium: 141 meq/L (ref 135–145)
Total Bilirubin: 0.5 mg/dL (ref 0.2–1.2)
Total Protein: 6.3 g/dL (ref 6.0–8.3)

## 2024-10-09 LAB — B12 AND FOLATE PANEL
Folate: 11.7 ng/mL
Vitamin B-12: 380 pg/mL (ref 211–911)

## 2024-10-09 LAB — BRAIN NATRIURETIC PEPTIDE: Pro B Natriuretic peptide (BNP): 800 pg/mL — ABNORMAL HIGH (ref 1.0–100.0)

## 2024-10-09 NOTE — Patient Instructions (Addendum)
" °  VISIT SUMMARY: During your visit, we discussed your fatigue, appetite loss, and significant weight loss. We reviewed your current medications and made adjustments to your oxygen  therapy and diet to help manage your symptoms.  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 -We have stopped Esbriet  due to its side effects and are considering Jascayd (nerandomilast) as an alternative. -We have initiated the paperwork for Jascayd (nerandomilast) prescription. -We checked your blood counts, vitamins, and thyroid  function. -We will send your catheterization report to the [facility] for processing.  CHRONIC RESPIRATORY FAILURE WITH HYPOXIA: Chronic respiratory failure requiring home oxygen  therapy. -We have increased your home oxygen  to 6 liters and instructed you to use it continuously at night. -We ordered an oxygen  levels test to assess your current status.  UNINTENTIONAL WEIGHT LOSS AND RISK OF MALNUTRITION: Significant weight loss likely due to medication side effects and disease progression. -We encouraged you to follow a high-calorie diet with protein supplements. -We instructed you to monitor your weight and nutritional status.  FATIGUE: Fatigue likely related to chronic respiratory failure, weight loss, and possible anemia or vitamin deficiencies. -We checked your blood counts, vitamins, and thyroid  function. -We encouraged you to follow a high-calorie diet and increase your protein intake.    Contains text generated by [employer].   "

## 2024-10-10 LAB — TSH RFX ON ABNORMAL TO FREE T4: TSH: 2.74 u[IU]/mL (ref 0.450–4.500)

## 2024-10-11 NOTE — Telephone Encounter (Signed)
 Copied from CRM #[id number]. Topic: Clinical - Lab/Test Results >> Oct 11, 2024  1:43 PM Rozanna G wrote: Reason for CRM: spouse calling about going over labs that were done on 02/03 and stated also he is not staying awake. Stated he is sleeping most of the day. Stated he is sleeping more than usual.  Please advise lab results

## 2024-10-12 ENCOUNTER — Inpatient Hospital Stay (HOSPITAL_COMMUNITY)

## 2024-10-12 ENCOUNTER — Emergency Department (HOSPITAL_COMMUNITY)

## 2024-10-12 ENCOUNTER — Telehealth: Payer: Self-pay | Admitting: Family Medicine

## 2024-10-12 ENCOUNTER — Encounter (HOSPITAL_COMMUNITY): Payer: Self-pay

## 2024-10-12 ENCOUNTER — Inpatient Hospital Stay (HOSPITAL_COMMUNITY): Admission: EM | Admit: 2024-10-12 | Source: Home / Self Care | Admitting: Student

## 2024-10-12 ENCOUNTER — Other Ambulatory Visit: Payer: Self-pay

## 2024-10-12 DIAGNOSIS — R634 Abnormal weight loss: Secondary | ICD-10-CM | POA: Diagnosis present

## 2024-10-12 DIAGNOSIS — R410 Disorientation, unspecified: Secondary | ICD-10-CM

## 2024-10-12 DIAGNOSIS — I509 Heart failure, unspecified: Secondary | ICD-10-CM

## 2024-10-12 DIAGNOSIS — E785 Hyperlipidemia, unspecified: Secondary | ICD-10-CM

## 2024-10-12 DIAGNOSIS — I5033 Acute on chronic diastolic (congestive) heart failure: Secondary | ICD-10-CM

## 2024-10-12 DIAGNOSIS — R0600 Dyspnea, unspecified: Principal | ICD-10-CM

## 2024-10-12 DIAGNOSIS — R531 Weakness: Secondary | ICD-10-CM

## 2024-10-12 DIAGNOSIS — J189 Pneumonia, unspecified organism: Secondary | ICD-10-CM

## 2024-10-12 DIAGNOSIS — N401 Enlarged prostate with lower urinary tract symptoms: Secondary | ICD-10-CM

## 2024-10-12 DIAGNOSIS — J9621 Acute and chronic respiratory failure with hypoxia: Secondary | ICD-10-CM | POA: Diagnosis present

## 2024-10-12 DIAGNOSIS — R0989 Other specified symptoms and signs involving the circulatory and respiratory systems: Secondary | ICD-10-CM

## 2024-10-12 DIAGNOSIS — J84112 Idiopathic pulmonary fibrosis: Secondary | ICD-10-CM

## 2024-10-12 DIAGNOSIS — E46 Unspecified protein-calorie malnutrition: Secondary | ICD-10-CM

## 2024-10-12 DIAGNOSIS — J3489 Other specified disorders of nose and nasal sinuses: Secondary | ICD-10-CM

## 2024-10-12 LAB — CBC WITH DIFFERENTIAL/PLATELET
Abs Immature Granulocytes: 0.02 10*3/uL (ref 0.00–0.07)
Basophils Absolute: 0.1 10*3/uL (ref 0.0–0.1)
Basophils Relative: 1 %
Eosinophils Absolute: 0.2 10*3/uL (ref 0.0–0.5)
Eosinophils Relative: 2 %
HCT: 52.6 % — ABNORMAL HIGH (ref 39.0–52.0)
Hemoglobin: 16.3 g/dL (ref 13.0–17.0)
Immature Granulocytes: 0 %
Lymphocytes Relative: 18 %
Lymphs Abs: 1.5 10*3/uL (ref 0.7–4.0)
MCH: 29.9 pg (ref 26.0–34.0)
MCHC: 31 g/dL (ref 30.0–36.0)
MCV: 96.5 fL (ref 80.0–100.0)
Monocytes Absolute: 1 10*3/uL (ref 0.1–1.0)
Monocytes Relative: 12 %
Neutro Abs: 5.6 10*3/uL (ref 1.7–7.7)
Neutrophils Relative %: 67 %
Platelets: 174 10*3/uL (ref 150–400)
RBC: 5.45 MIL/uL (ref 4.22–5.81)
RDW: 15 % (ref 11.5–15.5)
WBC: 8.3 10*3/uL (ref 4.0–10.5)
nRBC: 0 % (ref 0.0–0.2)

## 2024-10-12 LAB — RESPIRATORY PANEL BY PCR

## 2024-10-12 LAB — RESP PANEL BY RT-PCR (RSV, FLU A&B, COVID)  RVPGX2
Influenza A by PCR: NEGATIVE
Influenza B by PCR: NEGATIVE
Resp Syncytial Virus by PCR: NEGATIVE
SARS Coronavirus 2 by RT PCR: NEGATIVE

## 2024-10-12 LAB — I-STAT VENOUS BLOOD GAS, ED
Acid-Base Excess: 5 mmol/L — ABNORMAL HIGH (ref 0.0–2.0)
Bicarbonate: 35.6 mmol/L — ABNORMAL HIGH (ref 20.0–28.0)
Calcium, Ion: 1.15 mmol/L (ref 1.15–1.40)
HCT: 54 % — ABNORMAL HIGH (ref 39.0–52.0)
Hemoglobin: 18.4 g/dL — ABNORMAL HIGH (ref 13.0–17.0)
O2 Saturation: 96 %
Potassium: 4.4 mmol/L (ref 3.5–5.1)
Sodium: 140 mmol/L (ref 135–145)
TCO2: 38 mmol/L — ABNORMAL HIGH (ref 22–32)
pCO2, Ven: 75.8 mmHg (ref 44–60)
pH, Ven: 7.28 (ref 7.25–7.43)
pO2, Ven: 93 mmHg — ABNORMAL HIGH (ref 32–45)

## 2024-10-12 LAB — COMPREHENSIVE METABOLIC PANEL WITH GFR
ALT: 24 U/L (ref 0–44)
AST: 40 U/L (ref 15–41)
Albumin: 3.8 g/dL (ref 3.5–5.0)
Alkaline Phosphatase: 66 U/L (ref 38–126)
Anion gap: 9 (ref 5–15)
BUN: 29 mg/dL — ABNORMAL HIGH (ref 8–23)
CO2: 34 mmol/L — ABNORMAL HIGH (ref 22–32)
Calcium: 9.5 mg/dL (ref 8.9–10.3)
Chloride: 98 mmol/L (ref 98–111)
Creatinine, Ser: 0.98 mg/dL (ref 0.61–1.24)
GFR, Estimated: >60 mL/min
Glucose, Bld: 110 mg/dL — ABNORMAL HIGH (ref 70–99)
Potassium: 4.7 mmol/L (ref 3.5–5.1)
Sodium: 141 mmol/L (ref 135–145)
Total Bilirubin: 0.5 mg/dL (ref 0.0–1.2)
Total Protein: 6.8 g/dL (ref 6.5–8.1)

## 2024-10-12 LAB — STREP PNEUMONIAE URINARY ANTIGEN: Strep Pneumo Urinary Antigen: NEGATIVE

## 2024-10-12 LAB — TROPONIN T, HIGH SENSITIVITY
Troponin T High Sensitivity: 46 ng/L — ABNORMAL HIGH (ref 0–19)
Troponin T High Sensitivity: 44 ng/L — ABNORMAL HIGH (ref 0–19)

## 2024-10-12 LAB — PRO BRAIN NATRIURETIC PEPTIDE: Pro Brain Natriuretic Peptide: 6946 pg/mL — ABNORMAL HIGH

## 2024-10-12 LAB — MAGNESIUM: Magnesium: 2 mg/dL (ref 1.7–2.4)

## 2024-10-12 MED ORDER — SODIUM CHLORIDE 0.9 % IV SOLN
100.0000 mg | Freq: Once | INTRAVENOUS | Status: AC
  Administered 2024-10-12: 100 mg via INTRAVENOUS
  Filled 2024-10-12: qty 100

## 2024-10-12 MED ORDER — ONDANSETRON HCL 4 MG PO TABS: 4.0000 mg | ORAL_TABLET | Freq: Four times a day (QID) | ORAL | Status: AC | PRN

## 2024-10-12 MED ORDER — ADULT MULTIVITAMIN W/MINERALS CH: 1.0000 | ORAL_TABLET | Freq: Every day | ORAL | Status: AC

## 2024-10-12 MED ORDER — SODIUM CHLORIDE 0.9 % IV SOLN: 1.0000 g | INTRAVENOUS | Status: AC

## 2024-10-12 MED ORDER — BISACODYL 5 MG PO TBEC: 5.0000 mg | DELAYED_RELEASE_TABLET | Freq: Every day | ORAL | Status: AC | PRN

## 2024-10-12 MED ORDER — PAROXETINE HCL 10 MG PO TABS
10.0000 mg | ORAL_TABLET | Freq: Every day | ORAL | Status: AC
  Administered 2024-10-12: 10 mg via ORAL
  Filled 2024-10-12: qty 1

## 2024-10-12 MED ORDER — SODIUM CHLORIDE 0.9 % IV SOLN: 500.0000 mg | Freq: Once | INTRAVENOUS | Status: DC

## 2024-10-12 MED ORDER — ACETAMINOPHEN 650 MG RE SUPP: 650.0000 mg | Freq: Four times a day (QID) | RECTAL | Status: AC | PRN

## 2024-10-12 MED ORDER — SENNOSIDES-DOCUSATE SODIUM 8.6-50 MG PO TABS: 1.0000 | ORAL_TABLET | Freq: Every evening | ORAL | Status: AC | PRN

## 2024-10-12 MED ORDER — BOOST / RESOURCE BREEZE PO LIQD CUSTOM
1.0000 | Freq: Three times a day (TID) | ORAL | Status: AC
  Administered 2024-10-12: 1 via ORAL
  Filled 2024-10-12: qty 1

## 2024-10-12 MED ORDER — FUROSEMIDE 10 MG/ML IJ SOLN: 40.0000 mg | Freq: Every day | INTRAMUSCULAR | Status: AC

## 2024-10-12 MED ORDER — IPRATROPIUM-ALBUTEROL 0.5-2.5 (3) MG/3ML IN SOLN: 3.0000 mL | Freq: Four times a day (QID) | RESPIRATORY_TRACT | Status: AC | PRN

## 2024-10-12 MED ORDER — ASPIRIN 81 MG PO TBEC
81.0000 mg | DELAYED_RELEASE_TABLET | Freq: Every day | ORAL | Status: AC
  Administered 2024-10-12: 81 mg via ORAL
  Filled 2024-10-12: qty 1

## 2024-10-12 MED ORDER — PREDNISONE 20 MG PO TABS: 40.0000 mg | ORAL_TABLET | Freq: Every day | ORAL | Status: AC

## 2024-10-12 MED ORDER — DOCUSATE SODIUM 100 MG PO CAPS
100.0000 mg | ORAL_CAPSULE | Freq: Two times a day (BID) | ORAL | Status: AC
  Administered 2024-10-12: 100 mg via ORAL
  Filled 2024-10-12: qty 1

## 2024-10-12 MED ORDER — ONDANSETRON HCL 4 MG/2ML IJ SOLN: 4.0000 mg | Freq: Four times a day (QID) | INTRAMUSCULAR | Status: AC | PRN

## 2024-10-12 MED ORDER — MIDAZOLAM HCL (PF) 2 MG/2ML IJ SOLN
1.0000 mg | Freq: Once | INTRAMUSCULAR | Status: AC
  Administered 2024-10-12: 1 mg via INTRAVENOUS
  Filled 2024-10-12: qty 2

## 2024-10-12 MED ORDER — FINASTERIDE 5 MG PO TABS
5.0000 mg | ORAL_TABLET | Freq: Every day | ORAL | Status: AC
  Administered 2024-10-12: 5 mg via ORAL
  Filled 2024-10-12: qty 1

## 2024-10-12 MED ORDER — FENOFIBRATE 160 MG PO TABS: 160.0000 mg | ORAL_TABLET | Freq: Every day | ORAL | Status: AC

## 2024-10-12 MED ORDER — AZITHROMYCIN 250 MG PO TABS: 500.0000 mg | ORAL_TABLET | Freq: Every day | ORAL | Status: AC

## 2024-10-12 MED ORDER — FLUTICASONE PROPIONATE 50 MCG/ACT NA SUSP
2.0000 | Freq: Every day | NASAL | Status: AC
  Filled 2024-10-12: qty 16

## 2024-10-12 MED ORDER — GADOBUTROL 1 MMOL/ML IV SOLN
7.0000 mL | Freq: Once | INTRAVENOUS | Status: AC | PRN
  Administered 2024-10-12: 7 mL via INTRAVENOUS

## 2024-10-12 MED ORDER — ENOXAPARIN SODIUM 40 MG/0.4ML IJ SOSY
40.0000 mg | PREFILLED_SYRINGE | INTRAMUSCULAR | Status: AC
  Administered 2024-10-12: 40 mg via SUBCUTANEOUS
  Filled 2024-10-12: qty 0.4

## 2024-10-12 MED ORDER — FUROSEMIDE 10 MG/ML IJ SOLN
40.0000 mg | Freq: Once | INTRAMUSCULAR | Status: AC
  Administered 2024-10-12: 40 mg via INTRAVENOUS
  Filled 2024-10-12: qty 4

## 2024-10-12 MED ORDER — SODIUM CHLORIDE 0.9 % IV SOLN
1.0000 g | Freq: Once | INTRAVENOUS | Status: AC
  Administered 2024-10-12: 1 g via INTRAVENOUS
  Filled 2024-10-12: qty 10

## 2024-10-12 MED ORDER — ACETAMINOPHEN 325 MG PO TABS: 650.0000 mg | ORAL_TABLET | Freq: Four times a day (QID) | ORAL | Status: AC | PRN

## 2024-10-12 MED ORDER — LORATADINE 10 MG PO TABS
10.0000 mg | ORAL_TABLET | Freq: Every day | ORAL | Status: AC
  Administered 2024-10-12: 10 mg via ORAL
  Filled 2024-10-12: qty 1

## 2024-10-12 MED ORDER — ATORVASTATIN CALCIUM 40 MG PO TABS
40.0000 mg | ORAL_TABLET | Freq: Every day | ORAL | Status: AC
  Administered 2024-10-12: 40 mg via ORAL
  Filled 2024-10-12: qty 4

## 2024-10-12 MED ORDER — LORAZEPAM 1 MG PO TABS
1.0000 mg | ORAL_TABLET | Freq: Once | ORAL | Status: AC
  Administered 2024-10-12: 1 mg via ORAL
  Filled 2024-10-12: qty 1

## 2024-10-12 NOTE — ED Notes (Signed)
 Bilateral soft restraints removed from patient. Pt calm and resting in bed.

## 2024-10-12 NOTE — H&P (Signed)
 " History and Physical  Todd Parsons FMW:[id number] DOB: [birth year] DOA: 10/12/2024  PCP: Alvan Dorothyann BIRCH, MD   Chief Complaint: AMS, hypoxia  HPI: Todd Parsons is a 79 y.o. male with medical history significant for interstitial lung disease (IPF), chronic hypoxic respiratory failure on 4 L [place], A-flutter s/p ablation, CAD s/p CABG, bicuspid aortic valve s/p TAVR, aortic aneurysm repair, HTN, HLD, prediabetes and osteoarthritis who presents to the ED for evaluation of altered mental status, dyspnea on exertion and shortness of breath. Per family, patient has had a decline since December with increased fatigue, lethargy and confusion.  He was taken off Esbriet  by his pulmonology 2 weeks ago but since then, he has had worsening dyspnea on exertion. He was seen by the pulmonologist on Tuesday who advised to increase O2 to 6 L [place] due to hypoxia.  Over the last few days, he continues to be confused with difficulty thinking, poor memory and figuring out what to say. He has also had worsening fatigue, and lower extremity swelling but no fevers, chills,, chest pain, headache, cough, nausea, vomiting, dysuria or abdominal pain. He has had significant weight loss the last few months due to poor p.o. intake.  ED Course: Initial vitals show patient afebrile, RR 18-25, HR 90-110s, SBP 130-150s, SpO2 100% on 4 L. Initial labs significant for proBNP 6946, pCO2 75.8, troponin 46-44, H&H 16.3/62.6, negative flu/RSV/COVID test. CXR shows increased right upper and left lower lung patchy opacities on background of reticulations, concerning for atelectasis, aspiration pneumonia and superimposed pulmonary fibrosis. Pulmonology was consulted for evaluation. MRI brain with no acute or cranial abnormality. MRI head and neck shows findings likely artifact but dissection cannot be excluded.  Pt received IV Rocephin , IV doxycycline , IV Lasix  and IV Versed .  TRH was consulted for admission.   Review of Systems: Please see HPI  for pertinent positives and negatives. A complete 10 system review of systems are otherwise negative.  Past Medical History:  Diagnosis Date   Arthritis    osteoarthritis   Bicuspid aortic valve    Cataract    Coronary artery disease    Dysrhythmia    atrial flutter   Echocardiogram with ECG monitoring 01/12/2010   60-65% EF   Hyperlipidemia    Hypertension    Pre-diabetes    Past Surgical History:  Procedure Laterality Date   A-FLUTTER ABLATION  2017   ACROMIO-CLAVICULAR JOINT REPAIR Right 1980s       apnea device implant     then removed.     CARDIAC CATHETERIZATION  07/2015   CARDIAC VALVE REPLACEMENT  08/05/2015   Aortic valve   COLONOSCOPY     CORONARY ARTERY BYPASS GRAFT  08/05/2015   left knee arthoscopy     NOSE SURGERY  late 50's   to fix a broken nose   RIGHT HEART CATH N/A 08/10/2024   Procedure: RIGHT HEART CATH;  Surgeon: Cherrie Toribio SAUNDERS, MD;  Location: [facility];  Service: Cardiovascular;  Laterality: N/A;   TONSILECTOMY, ADENOIDECTOMY, BILATERAL MYRINGOTOMY AND TUBES     TONSILLECTOMY     TOTAL KNEE ARTHROPLASTY Right 02/28/2017   Procedure: RIGHT TOTAL KNEE ARTHROPLASTY;  Surgeon: Yvone Rush, MD;  Location: [facility] OR;  Service: Orthopedics;  Laterality: Right;   Social History:  reports that he quit smoking about 46 years ago. His smoking use included cigarettes. He started smoking about 63 years ago. He has never used smokeless tobacco. He reports current alcohol use of about 2.0 standard  drinks of alcohol per week. He reports that he does not use drugs.  Allergies[1]  Family History  Problem Relation Age of Onset   Stroke Mother 71   Cancer Mother        upper palate   Heart disease Father 65       bypass surgery   Colon cancer Neg Hx    Colon polyps Neg Hx    Esophageal cancer Neg Hx    Rectal cancer Neg Hx    Stomach cancer Neg Hx      Prior to Admission medications  Medication Sig Start Date End Date Taking? Authorizing Provider   amLODipine  (NORVASC ) 5 MG tablet Take 5 mg by mouth daily. 12/27/23   [provider]  aspirin  EC 81 MG tablet Take 81 mg by mouth daily.    [provider]  atorvastatin  (LIPITOR) 40 MG tablet TAKE 1 TABLET BY MOUTH AT  BEDTIME 01/27/24   Alvan Dorothyann BIRCH, MD  azelastine  (ASTELIN ) 0.1 % nasal spray Place 2 sprays into both nostrils 2 (two) times daily as needed for rhinitis. Use in each nostril as directed 05/29/24   Tobie Arleta SQUIBB, MD  cetirizine  (ZYRTEC ) 10 MG tablet Take 1 tablet (10 mg total) by mouth daily. 02/08/24   Breeback, Jade L, PA-C  ESBRIET  267 MG TABS Take 3 tablets (801 mg total) by mouth 3 (three) times daily with meals. **dose increase** 05/24/24   Mannam, Praveen, MD  fenofibrate  160 MG tablet TAKE 1 TABLET BY MOUTH DAILY 01/27/24   Alvan Dorothyann BIRCH, MD  finasteride  (PROSCAR ) 5 MG tablet TAKE 1 TABLET BY MOUTH DAILY 01/27/24   Alvan Dorothyann BIRCH, MD  fluticasone  (FLONASE ) 50 MCG/ACT nasal spray Place 2 sprays into both nostrils daily. 09/19/24   Metheney, Catherine D, MD  ipratropium (ATROVENT ) 0.03 % nasal spray Place 1 spray into both nostrils 3 (three) times daily as needed for rhinitis. 05/29/24   Tobie Arleta SQUIBB, MD  mirtazapine  (REMERON ) 7.5 MG tablet Take 1 tablet (7.5 mg total) by mouth at bedtime. 09/04/24   Alvan Dorothyann BIRCH, MD  pantoprazole  (PROTONIX ) 40 MG tablet TAKE 1 TABLET BY MOUTH DAILY  ABOUT 1/2 HOUR BEFORE BREAKFAST Patient taking differently: as needed. 01/27/24   Alvan Dorothyann BIRCH, MD  PARoxetine  (PAXIL ) 10 MG tablet Take 1 tablet (10 mg total) by mouth daily. 09/04/24   Alvan Dorothyann BIRCH, MD  tamsulosin  (FLOMAX ) 0.4 MG CAPS capsule Take 1 capsule (0.4 mg total) by mouth 2 (two) times daily. Patient taking differently: Take 0.4 mg by mouth daily. 08/17/23   Alvan Dorothyann BIRCH, MD    Physical Exam: BP (!) 138/99 (BP Location: Right Arm)   Pulse 100   Temp 98 F (36.7 C) (Oral)   Resp (!) 25   Ht 5' 5 (1.651 m)    Wt 68 kg   SpO2 100%   BMI 24.96 kg/m  General: Pleasant, chronically ill and weak appearing laying in bed. No acute distress. HEENT: [place]/AT. Anicteric sclera CV: Tachycardia. Regular rhythm. No murmurs, rubs, or gallops.  Pulmonary: Tachypnea. On 5 L [place]. Lungs CTAB. Diffuse fine crackles Abdominal: Soft, nontender, nondistended. Normal bowel sounds. Extremities: 1+ BLE pitting edema up to mid calf. Palpable radial and DP pulses. Normal ROM. Skin: Warm and dry. No obvious rash or lesions. Neuro: Lethargic.  Oriented to self, person and place but not to time. Slightly delayed thought process with occasional rambling. Moves all extremities. Normal sensation to light touch. No focal deficit. Psych:  Normal mood and affect         Labs on Admission:  Basic Metabolic Panel: Recent Labs  Lab 10/09/24 1459 10/12/24 1318 10/12/24 1435  NA 141 141 140  K 4.4 4.7 4.4  CL 100 98  --   CO2 34* 34*  --   GLUCOSE 125* 110*  --   BUN 29* 29*  --   CREATININE 0.97 0.98  --   CALCIUM  9.4 9.5  --   MG  --  2.0  --    Liver Function Tests: Recent Labs  Lab 10/09/24 1459 10/12/24 1318  AST 27 40  ALT 19 24  ALKPHOS 54 66  BILITOT 0.5 0.5  PROT 6.3 6.8  ALBUMIN 3.6 3.8   No results for input(s): LIPASE, AMYLASE in the last 168 hours. No results for input(s): AMMONIA in the last 168 hours. CBC: Recent Labs  Lab 10/09/24 1459 10/12/24 1318 10/12/24 1435  WBC 7.5 8.3  --   NEUTROABS 5.2 5.6  --   HGB 15.7 16.3 18.4*  HCT 48.0 52.6* 54.0*  MCV 92.2 96.5  --   PLT 174.0 174  --    Cardiac Enzymes: No results for input(s): CKTOTAL, CKMB, CKMBINDEX, TROPONINI in the last 168 hours. BNP (last 3 results) No results for input(s): BNP in the last 8760 hours.  ProBNP (last 3 results) Recent Labs    05/23/24 1617 10/09/24 1459 10/12/24 1318  PROBNP 64.0 800.0* 6,946.0*    CBG: No results for input(s): GLUCAP in the last 168 hours.  Radiological Exams on  Admission: MR ANGIO HEAD WO CONTRAST Result Date: 10/12/2024 EXAM: MRI BRAIN WITHOUT CONTRAST MRA HEAD WITHOUT CONTRAST MRA NECK WITHOUT CONTRAST 10/12/2024 05:07:56 PM TECHNIQUE: Multiplanar multisequence MRI of the brain was performed without the administration of intravenous contrast. MRA of the head was performed without contrast using time-of-flight technique. MRA of the neck was performed without contrast. 3D postprocessing with multiplanar reformations and MIPs was performed for better evaluation of the vasculature. Stenosis of the cervical internal carotid arteries is measured using NASCET criteria. COMPARISON: 01/12/2021 CLINICAL HISTORY: Neuro deficit, acute, stroke suspected. Acute neurological deficit; stroke suspected. FINDINGS: MRI BRAIN: BRAIN AND VENTRICLES: Similar appearance of encephalomalacia and gliosis in the lateral right occipital lobe and inferior right parietal lobe; findings are compatible with remote infarct. T2 and FLAIR hyperintensity in the periventricular and subcortical white matter suggestive of mild to moderate chronic microvascular ischemic changes, which are slightly increased from prior. No acute infarct. No acute intracranial hemorrhage. No mass. No midline shift. No hydrocephalus. The sella is unremarkable. Normal flow voids. ORBITS: No significant abnormality. SINUSES AND MASTOIDS: No significant abnormality. BONES AND SOFT TISSUES: Normal bone marrow signal. No soft tissue abnormality. MRA HEAD: ANTERIOR CIRCULATION: No significant stenosis of the internal carotid arteries. No significant stenosis of the anterior cerebral arteries. No significant stenosis of the middle cerebral arteries. No aneurysm. POSTERIOR CIRCULATION: Posterior communicating artery is visualized on the left. The left posterior cerebral artery is supplied by the left posterior communicating artery and by the basilar artery. There is tortuosity of the left P1 segment. There is irregularity and mild  stenosis at the proximal P2 segment of the left PCA. AICA visualized on the right. PICA visualized on the left. No significant stenosis of the basilar artery. No aneurysm. MRA NECK: CERVICAL CAROTID ARTERIES: There is abnormal long segment cervical abnormality along the lateral aspect of the right common carotid artery extending to the bifurcation and possibly involving the proximal  right cervical ICA. There is an additional similar area of signal abnormality involving the medial aspect of the left common carotid artery and proximal left cervical ICA. The carotid arteries are patent bilaterally from the origins to the skull base. No hemodynamically significant stenosis by NASCET criteria. CERVICAL VERTEBRAL ARTERIES: There is mild tortuosity of the V1 segment of the left vertebral artery. The vertebral arteries are patent from the origins to the vertebrobasilar confluence. No dissection. No significant stenosis. IMPRESSION: 1. No acute intracranial abnormality. 2. Abnormal longitudinal signal abnormality involving the lateral aspect of the right common carotid artery extending to the bifurcation and possibly involving the proximal right cervical ICA, and an additional similar area of signal abnormality involving the medial aspect of the left common carotid artery and proximal left cervical ICA. Findings may be artifactual. Dissection is considered less likely but cannot be excluded. CTA of the head and neck is recommended for further evaluation. 3. Patent intracranial arterial vasculature. 4. 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 5. Mild to moderate chronic microvascular ischemic changes, slightly increased from prior. Electronically signed by: Donnice Mania MD 10/12/2024 05:49 PM EST RP Workstation: [id number]   MR Angiogram Neck W or Wo Contrast Result Date: 10/12/2024 EXAM: MRI BRAIN WITHOUT CONTRAST MRA HEAD WITHOUT CONTRAST MRA NECK WITHOUT CONTRAST 10/12/2024 05:07:56 PM  TECHNIQUE: Multiplanar multisequence MRI of the brain was performed without the administration of intravenous contrast. MRA of the head was performed without contrast using time-of-flight technique. MRA of the neck was performed without contrast. 3D postprocessing with multiplanar reformations and MIPs was performed for better evaluation of the vasculature. Stenosis of the cervical internal carotid arteries is measured using NASCET criteria. COMPARISON: 01/12/2021 CLINICAL HISTORY: Neuro deficit, acute, stroke suspected. Acute neurological deficit; stroke suspected. FINDINGS: MRI BRAIN: BRAIN AND VENTRICLES: Similar appearance of encephalomalacia and gliosis in the lateral right occipital lobe and inferior right parietal lobe; findings are compatible with remote infarct. T2 and FLAIR hyperintensity in the periventricular and subcortical white matter suggestive of mild to moderate chronic microvascular ischemic changes, which are slightly increased from prior. No acute infarct. No acute intracranial hemorrhage. No mass. No midline shift. No hydrocephalus. The sella is unremarkable. Normal flow voids. ORBITS: No significant abnormality. SINUSES AND MASTOIDS: No significant abnormality. BONES AND SOFT TISSUES: Normal bone marrow signal. No soft tissue abnormality. MRA HEAD: ANTERIOR CIRCULATION: No significant stenosis of the internal carotid arteries. No significant stenosis of the anterior cerebral arteries. No significant stenosis of the middle cerebral arteries. No aneurysm. POSTERIOR CIRCULATION: Posterior communicating artery is visualized on the left. The left posterior cerebral artery is supplied by the left posterior communicating artery and by the basilar artery. There is tortuosity of the left P1 segment. There is irregularity and mild stenosis at the proximal P2 segment of the left PCA. AICA visualized on the right. PICA visualized on the left. No significant stenosis of the basilar artery. No aneurysm. MRA  NECK: CERVICAL CAROTID ARTERIES: There is abnormal long segment cervical abnormality along the lateral aspect of the right common carotid artery extending to the bifurcation and possibly involving the proximal right cervical ICA. There is an additional similar area of signal abnormality involving the medial aspect of the left common carotid artery and proximal left cervical ICA. The carotid arteries are patent bilaterally from the origins to the skull base. No hemodynamically significant stenosis by NASCET criteria. CERVICAL VERTEBRAL ARTERIES: There is mild tortuosity of the V1 segment of the left vertebral artery. The vertebral  arteries are patent from the origins to the vertebrobasilar confluence. No dissection. No significant stenosis. IMPRESSION: 1. No acute intracranial abnormality. 2. Abnormal longitudinal signal abnormality involving the lateral aspect of the right common carotid artery extending to the bifurcation and possibly involving the proximal right cervical ICA, and an additional similar area of signal abnormality involving the medial aspect of the left common carotid artery and proximal left cervical ICA. Findings may be artifactual. Dissection is considered less likely but cannot be excluded. CTA of the head and neck is recommended for further evaluation. 3. Patent intracranial arterial vasculature. 4. 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 5. Mild to moderate chronic microvascular ischemic changes, slightly increased from prior. Electronically signed by: Donnice Mania MD 10/12/2024 05:49 PM EST RP Workstation: [id number]   MR BRAIN WO CONTRAST Result Date: 10/12/2024 EXAM: MRI BRAIN WITHOUT CONTRAST MRA HEAD WITHOUT CONTRAST MRA NECK WITHOUT CONTRAST 10/12/2024 05:07:56 PM TECHNIQUE: Multiplanar multisequence MRI of the brain was performed without the administration of intravenous contrast. MRA of the head was performed without contrast using time-of-flight  technique. MRA of the neck was performed without contrast. 3D postprocessing with multiplanar reformations and MIPs was performed for better evaluation of the vasculature. Stenosis of the cervical internal carotid arteries is measured using NASCET criteria. COMPARISON: 01/12/2021 CLINICAL HISTORY: Neuro deficit, acute, stroke suspected. Acute neurological deficit; stroke suspected. FINDINGS: MRI BRAIN: BRAIN AND VENTRICLES: Similar appearance of encephalomalacia and gliosis in the lateral right occipital lobe and inferior right parietal lobe; findings are compatible with remote infarct. T2 and FLAIR hyperintensity in the periventricular and subcortical white matter suggestive of mild to moderate chronic microvascular ischemic changes, which are slightly increased from prior. No acute infarct. No acute intracranial hemorrhage. No mass. No midline shift. No hydrocephalus. The sella is unremarkable. Normal flow voids. ORBITS: No significant abnormality. SINUSES AND MASTOIDS: No significant abnormality. BONES AND SOFT TISSUES: Normal bone marrow signal. No soft tissue abnormality. MRA HEAD: ANTERIOR CIRCULATION: No significant stenosis of the internal carotid arteries. No significant stenosis of the anterior cerebral arteries. No significant stenosis of the middle cerebral arteries. No aneurysm. POSTERIOR CIRCULATION: Posterior communicating artery is visualized on the left. The left posterior cerebral artery is supplied by the left posterior communicating artery and by the basilar artery. There is tortuosity of the left P1 segment. There is irregularity and mild stenosis at the proximal P2 segment of the left PCA. AICA visualized on the right. PICA visualized on the left. No significant stenosis of the basilar artery. No aneurysm. MRA NECK: CERVICAL CAROTID ARTERIES: There is abnormal long segment cervical abnormality along the lateral aspect of the right common carotid artery extending to the bifurcation and possibly  involving the proximal right cervical ICA. There is an additional similar area of signal abnormality involving the medial aspect of the left common carotid artery and proximal left cervical ICA. The carotid arteries are patent bilaterally from the origins to the skull base. No hemodynamically significant stenosis by NASCET criteria. CERVICAL VERTEBRAL ARTERIES: There is mild tortuosity of the V1 segment of the left vertebral artery. The vertebral arteries are patent from the origins to the vertebrobasilar confluence. No dissection. No significant stenosis. IMPRESSION: 1. No acute intracranial abnormality. 2. Abnormal longitudinal signal abnormality involving the lateral aspect of the right common carotid artery extending to the bifurcation and possibly involving the proximal right cervical ICA, and an additional similar area of signal abnormality involving the medial aspect of the left common carotid artery and proximal  left cervical ICA. Findings may be artifactual. Dissection is considered less likely but cannot be excluded. CTA of the head and neck is recommended for further evaluation. 3. Patent intracranial arterial vasculature. 4. 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 5. Mild to moderate chronic microvascular ischemic changes, slightly increased from prior. Electronically signed by: Donnice Mania MD 10/12/2024 05:48 PM EST RP Workstation: [id number]   DG Chest 1 View Result Date: 10/12/2024 CLINICAL DATA:  Shortness of breath EXAM: CHEST  1 VIEW COMPARISON:  Chest radiograph dated 11/06/2018 FINDINGS: Median sternotomy wires are nondisplaced. Low lung volumes with bronchovascular crowding. Increased right upper and left lower lung patchy opacities on a background of reticulations. No pleural effusion or pneumothorax. Similar postsurgical cardiomediastinal silhouette. No acute osseous abnormality. IMPRESSION: Increased right upper and left lower lung patchy opacities on a  background of reticulations, which may represent atelectasis, aspiration, or pneumonia superimposed on pulmonary fibrosis. Electronically Signed   By: Limin  Xu M.D.   On: 10/12/2024 14:37    EKG: My independent interpretation is: Sinus tach with RVH and PVCs  Assessment/Plan Todd Parsons is a 79 y.o. male with medical history significant for  interstitial lung disease (IPF), chronic hypoxic respiratory failure on 4 L [place], A-flutter s/p ablation, CAD s/p CABG, bicuspid aortic valve s/p TAVR, aortic aneurysm repair, HTN, HLD, prediabetes and osteoarthritis who presents to the ED for evaluation of altered mental status, dyspnea on exertion and shortness of breath and admitted for acute on chronic respiratory failure with hypoxia and hypercarbia  # Acute on chronic respiratory failure with hypoxia and hypercarbia # Hx of idiopathic pulmonary fibrosis - Presented with recent dyspnea on exertion with decreased O2 needs to 5 L on admission with pCO2 elevated to 76 - Likely multifactorial in the setting of CHF exacerbation, progressive ILD and pneumonia - Pulmonology following, appreciate further recs - Start BiPAP therapy and follow-up repeat VBG - Continue supplemental O2, wean as able - Start prednisone  40 mg daily for 5 days  # Altered mental status - Has had progressive confusion over the last 2 months but no focal deficits on exam - MRI brain with no acute acute abnormality and no electrolyte abnormalities - Hypercarbia likely contributing to mental status change - Follow-up CTA head and neck to rule out possible dissection - Check UDS, ethanol, and ammonia levels - Delirium precaution - If no significant improvement, will likely benefit from neurocognitive eval in the outpatient  # Community-acquired pneumonia - Chest x-ray shows possible pneumonia superimposed on pulmonary fibrosis - Start IV Rocephin  and oral azithromycin  - Check MRSA screen, urinary Legionella, urinary strep pneumo,  sputum culture and procalcitonin - Trend CBC, fever curve  # Acute on chronic diastolic heart failure - Presented with recent increased dyspnea on exertion and lower extremity edema, proBNP ~7000 - Last TTE November 2025 showed EF 55-60%, moderate LVH, G1DD and moderately reduced RVSF - Repeat TTE to assess changes in systolic function - IV Lasix  40 mg daily - Strict I&O, daily weights - Maintain K+ > 4.0, Mag > 2.0 - Telemetry  # CAD s/p CABG. - Continue aspirin , fenofibrate  and atorvastatin   # HLD - Continue atorvastatin  and fenofibrate   # BPH - Continue finasteride   # Generalized weakness - Likely in the setting of poor nutrition, weight loss chronic respiratory failure - Follow-up vitamin D  and phosphorus levels - PT/OT eval and treat - Fall precaution  # Protein caloric malnutrition # Unintentional weight loss - Has lost about 50  pounds over the last few years.  On review of outpatient pulmonology notes, this is thought to be multifactorial due to progression of IPF and side effects of Esbriet  - Registered dietitian consulted appreciate assistance with nutrition - Start protein supplementation  DVT prophylaxis: Lovenox     Code Status: Full Code  Consults called: Pulmonology  Family Communication: Discussed results/findings and plan for admission with spouse and son at bedside  Severity of Illness: The appropriate patient status for this patient is INPATIENT. Inpatient status is judged to be reasonable and necessary in order to provide the required intensity of service to ensure the patient's safety. The patient's presenting symptoms, physical exam findings, and initial radiographic and laboratory data in the context of their chronic comorbidities is felt to place them at high risk for further clinical deterioration. Furthermore, it is not anticipated that the patient will be medically stable for discharge from the hospital within 2 midnights of admission.   * I  certify that at the point of admission it is my clinical judgment that the patient will require inpatient hospital care spanning beyond 2 midnights from the point of admission due to high intensity of service, high risk for further deterioration and high frequency of surveillance required.*  Level of care: Progressive   I personally spent a total of 75 minutes in the care of the patient today including preparing to see the patient, getting/reviewing separately obtained history, performing a medically appropriate exam/evaluation, placing orders, documenting clinical information in the EHR, independently interpreting results, and communicating results.   Lou Claretta HERO, MD 10/12/2024, 9:33 PM [facility] Pager: [phone] Isaiah 41:10   If 7PM-7AM, please contact night-coverage [email or web address] Password [email or web address]     [1] No Known Allergies  "

## 2024-10-12 NOTE — Evaluation (Signed)
 RT Evaluate and Treat Note  10/12/2024   Breathing is (select one): Same as normal    The following was found on auscultation (select multiple):  Bilateral Breath Sounds: Diminished;Coarse crackles (10/12/24 2059)  R Upper  Breath Sounds: Diminished (10/12/24 2059) L Upper Breath Sounds: Diminished (10/12/24 2059) R Lower Breath Sounds: Coarse crackles (10/12/24 2059) L Lower Breath Sounds: Diminished (10/12/24 2059)    Cough Assessment: Cough: None (10/12/24 2059)    Most Recent Chest Xray:... (No results found.    The following medications and/or interventions were ordered/changed/discontinued as part of the Respiratory Treatment protocol:   Medication Changes: No Change   Airway Clearance Changes: No Change   Oxygen  Therapy Changes:BiPAPOrdered             BIPAP ordered by MD.

## 2024-10-12 NOTE — ED Notes (Signed)
 MRI called to notify nurse PT attempted to leave scanner during procedure. PT alert but showing some confusion/slow responses. Provider consulted and IV Versed  given in MRI

## 2024-10-12 NOTE — ED Notes (Signed)
 Care handoff to this RN. Pt was already placed in bilateral upper soft restraints.

## 2024-10-12 NOTE — ED Provider Notes (Signed)
 "  EMERGENCY DEPARTMENT AT [facility] Provider Note   CSN: [id number] Arrival date & time: 10/12/24  1252     Patient presents with: Shortness of Breath   Todd Parsons is a 79 y.o. male.    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 Some question that PCP just increased oxygen  to 6L but patient still on 4 according to him.  Supportive wife at bedside states patient has had increasing confusion since Christmas.  No recent productive cough, fevers, chills, chest pain, any other concerns.    Prior to Admission medications  Medication Sig Start Date End Date Taking? Authorizing Provider  amLODipine  (NORVASC ) 5 MG tablet Take 5 mg by mouth daily. 12/27/23   [provider]  aspirin  EC 81 MG tablet Take 81 mg by mouth daily.    [provider]  atorvastatin  (LIPITOR) 40 MG tablet TAKE 1 TABLET BY MOUTH AT  BEDTIME 01/27/24   Alvan Dorothyann BIRCH, MD  azelastine  (ASTELIN ) 0.1 % nasal spray Place 2 sprays into both nostrils 2 (two) times daily as needed for rhinitis. Use in each nostril as directed 05/29/24   Tobie Arleta SQUIBB, MD  cetirizine  (ZYRTEC ) 10 MG tablet Take 1 tablet (10 mg total) by mouth daily. 02/08/24   Breeback, Jade L, PA-C  ESBRIET  267 MG TABS Take 3 tablets (801 mg total) by mouth 3 (three) times daily with meals. **dose increase** 05/24/24   Mannam, Praveen, MD  fenofibrate  160 MG tablet TAKE 1 TABLET BY MOUTH DAILY 01/27/24   Alvan Dorothyann BIRCH, MD  finasteride  (PROSCAR ) 5 MG tablet TAKE 1 TABLET BY MOUTH DAILY 01/27/24   Alvan Dorothyann BIRCH, MD  fluticasone  (FLONASE ) 50 MCG/ACT nasal spray Place 2 sprays into both nostrils daily. 09/19/24   Metheney, Catherine D, MD  ipratropium (ATROVENT ) 0.03 % nasal spray Place 1 spray into both nostrils 3 (three) times daily as needed for rhinitis. 05/29/24   Tobie Arleta SQUIBB, MD  mirtazapine  (REMERON ) 7.5 MG tablet Take 1 tablet  (7.5 mg total) by mouth at bedtime. 09/04/24   Alvan Dorothyann BIRCH, MD  pantoprazole  (PROTONIX ) 40 MG tablet TAKE 1 TABLET BY MOUTH DAILY  ABOUT 1/2 HOUR BEFORE BREAKFAST Patient taking differently: as needed. 01/27/24   Alvan Dorothyann BIRCH, MD  PARoxetine  (PAXIL ) 10 MG tablet Take 1 tablet (10 mg total) by mouth daily. 09/04/24   Alvan Dorothyann BIRCH, MD  tamsulosin  (FLOMAX ) 0.4 MG CAPS capsule Take 1 capsule (0.4 mg total) by mouth 2 (two) times daily. Patient taking differently: Take 0.4 mg by mouth daily. 08/17/23   Alvan Dorothyann BIRCH, MD    Allergies: Patient has no known allergies.    Review of Systems  Respiratory:  Positive for shortness of breath.     Updated Vital Signs BP (!) 132/108   Pulse (!) 102   Temp 97.6 F (36.4 C) (Oral)   Resp (!) 24   Ht 5' 5 (1.651 m)   Wt 68 kg   SpO2 100%   BMI 24.96 kg/m   Physical Exam Vitals and nursing note reviewed.  Constitutional:      General: He is not in acute distress.    Appearance: He is well-developed.  HENT:     Head: Normocephalic and atraumatic.  Eyes:     Conjunctiva/sclera: Conjunctivae normal.  Cardiovascular:     Rate and Rhythm: Normal rate and regular rhythm.  Heart sounds: No murmur heard. Pulmonary:     Effort: Pulmonary effort is normal. No respiratory distress.     Comments: Diffuse crackles Abdominal:     Palpations: Abdomen is soft.     Tenderness: There is no abdominal tenderness.  Musculoskeletal:        General: No swelling.     Cervical back: Neck supple.     Comments: +1 edema BLE  Skin:    General: Skin is warm and dry.     Capillary Refill: Capillary refill takes less than 2 seconds.  Neurological:     Mental Status: He is alert.  Psychiatric:        Mood and Affect: Mood normal.     (all labs ordered are listed, but only abnormal results are displayed) Labs Reviewed  PRO BRAIN NATRIURETIC PEPTIDE - Abnormal; Notable for the following components:      Result Value    Pro Brain Natriuretic Peptide 6,946.0 (*)    All other components within normal limits  COMPREHENSIVE METABOLIC PANEL WITH GFR - Abnormal; Notable for the following components:   CO2 34 (*)    Glucose, Bld 110 (*)    BUN 29 (*)    All other components within normal limits  CBC WITH DIFFERENTIAL/PLATELET - Abnormal; Notable for the following components:   HCT 52.6 (*)    All other components within normal limits  I-STAT VENOUS BLOOD GAS, ED - Abnormal; Notable for the following components:   pCO2, Ven 75.8 (*)    pO2, Ven 93 (*)    Bicarbonate 35.6 (*)    TCO2 38 (*)    Acid-Base Excess 5.0 (*)    HCT 54.0 (*)    Hemoglobin 18.4 (*)    All other components within normal limits  TROPONIN T, HIGH SENSITIVITY - Abnormal; Notable for the following components:   Troponin T High Sensitivity 46 (*)    All other components within normal limits  TROPONIN T, HIGH SENSITIVITY - Abnormal; Notable for the following components:   Troponin T High Sensitivity 44 (*)    All other components within normal limits  RESP PANEL BY RT-PCR (RSV, FLU A&B, COVID)  RVPGX2  RESPIRATORY PANEL BY PCR  CULTURE, BLOOD (ROUTINE X 2)  CULTURE, BLOOD (ROUTINE X 2)  MAGNESIUM   I-STAT ARTERIAL BLOOD GAS, ED    EKG: None  Radiology: DG Chest 1 View Result Date: 10/12/2024 CLINICAL DATA:  Shortness of breath EXAM: CHEST  1 VIEW COMPARISON:  Chest radiograph dated 11/06/2018 FINDINGS: Median sternotomy wires are nondisplaced. Low lung volumes with bronchovascular crowding. Increased right upper and left lower lung patchy opacities on a background of reticulations. No pleural effusion or pneumothorax. Similar postsurgical cardiomediastinal silhouette. No acute osseous abnormality. IMPRESSION: Increased right upper and left lower lung patchy opacities on a background of reticulations, which may represent atelectasis, aspiration, or pneumonia superimposed on pulmonary fibrosis. Electronically Signed   By: Limin  Xu M.D.    On: 10/12/2024 14:37     Procedures   Medications Ordered in the ED  furosemide  (LASIX ) injection 40 mg (has no administration in time range)  cefTRIAXone  (ROCEPHIN ) 1 g in sodium chloride  0.9 % 100 mL IVPB (has no administration in time range)  doxycycline  (VIBRAMYCIN ) 100 mg in sodium chloride  0.9 % 250 mL IVPB (has no administration in time range)  midazolam  PF (VERSED ) injection 1 mg (has no administration in time range)    Clinical Course as of 10/12/24 1602  Fri Oct 12, 2024  1534 Labs significant for elevated BNP.  Chest x-ray showing possible infiltrate. [AJ]  1534 Initiating antibiotics, diuresis, continue to monitor troponin, awaiting MRI/MRA results.  Plan to admit to medicine. [AJ]    Clinical Course User Index [AJ] Fredia Rosette Kirsch, MD                                 Medical Decision Making Amount and/or Complexity of Data Reviewed Independent Historian: spouse External Data Reviewed: radiology. Labs: ordered. Radiology: ordered.  Risk Prescription drug management.   79 year old male with history of IPF on home oxygen  presents with subtle worsening in cognitive functions since Christmas.  Here neurologic exam is nonfocal.  Vital signs are reassuring.  Discussed with pulmonologist who agrees with basic workup and MRI/MRA given worsening cognitive function.  No elevated BNP at time of signout and Dr. Patt my colleague is not in touch with pulmonary with plans to start diuresis, antibiotics, admit pending MRI/MRI.     Final diagnoses:  Dyspnea, unspecified type    ED Discharge Orders     None          Fredia Rosette Kirsch, MD 10/12/24 8397  "

## 2024-10-12 NOTE — Telephone Encounter (Signed)
 Pls call pt and wife. See is a nutrition consul would be helpful lfor him

## 2024-10-12 NOTE — ED Provider Notes (Signed)
" °  Physical Exam  BP (!) 130/109 (BP Location: Right Arm)   Pulse 96   Temp 97.7 F (36.5 C) (Oral)   Resp 18   Ht 5' 5 (1.651 m)   Wt 68 kg   SpO2 100%   BMI 24.96 kg/m   Physical Exam  Procedures  Procedures  ED Course / MDM   Clinical Course as of 10/12/24 1853  Fri Oct 12, 2024  1534 Labs significant for elevated BNP.  Chest x-ray showing possible infiltrate. [AJ]  1534 Initiating antibiotics, diuresis, continue to monitor troponin, awaiting MRI/MRA results.  Plan to admit to medicine. [AJ]    Clinical Course User Index [AJ] Jonnal, Aparna Hima, MD   Medical Decision Making Care assumed at 3 PM.  Patient is here with worsening shortness of breath and increasing confusion.  Signed out pending MRI brain and follow-up pulmonology evaluation  6:55 PM Patient's VBG showed pH of 7.28 and CO2 of 75.  Pulmonology recommend IV steroids and IV antibiotics.  Also has elevated BNP of 6000 and ordered IV diuretics.  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  Discussed with Dr. Michaela from neurology. He recommend CT head/neck and if it is positive for dissection then recommend aspirin  and anticoagulation and consult neurology.  Otherwise patient can be admitted to the hospital service and pulmonology to follow  Problems Addressed: Acute on chronic heart failure, unspecified heart failure type [facility]): acute illness or injury Confusion: acute illness or injury Dyspnea, unspecified type: acute illness or injury  Amount and/or Complexity of Data Reviewed Labs: ordered. Decision-making details documented in ED Course. Radiology: ordered and independent interpretation performed. Decision-making details documented in ED Course.  Risk Prescription drug management. Decision regarding hospitalization.          Patt Alm Macho, MD 10/12/24 1857  "

## 2024-10-12 NOTE — Telephone Encounter (Signed)
 I called and discussed with patient's wife.  His labs look okay with no explanation for his lethargy.  Per patient he is becoming more unresponsive with indecipherable speech.  I have advised her to bring him to the emergency room for evaluation for hypercarbia, stroke or other reasons for altered mental status.

## 2024-10-12 NOTE — Consult Note (Signed)
" ° °  NAME:  Todd Parsons, MRN:  [id number], DOB:  05/11/1946, LOS: 0 ADMISSION DATE:  10/12/2024, CONSULTATION DATE: 10/12/2024 REFERRING MD: Rosette Dais MD , CHIEF COMPLAINT: Altered mental status, confusion  History of Present Illness:   Patient is well-known to me from clinic being followed for idiopathic pulmonary fibrosis on antifibrotic therapy with Esbriet  since 2022.  He has had a declining course over the past month with increasing confusion, lethargy, fatigue, weakness with dyspnea.  Esbriet  was stopped a couple of weeks ago.  He was evaluated in the pulmonary clinic and O2 prescription increased to 6 L/min due to hypoxia but he continued to decline Today he was more confused with difficulty speaking words and was asked to come to the ED for further evaluation  Pertinent  Medical History    has a past medical history of Arthritis, Bicuspid aortic valve, Cataract, Coronary artery disease, Dysrhythmia, Echocardiogram with ECG monitoring (01/12/2010), Hyperlipidemia, Hypertension, and Pre-diabetes.   Significant Hospital Events: Including procedures, antibiotic start and stop dates in addition to other pertinent events     Interim History / Subjective:    Objective    Blood pressure (!) 135/106, pulse (!) 101, temperature 97.6 F (36.4 C), temperature source Oral, resp. rate (!) 25, height 5' 5 (1.651 m), weight 68 kg, SpO2 100%.       No intake or output data in the 24 hours ending 10/12/24 1449 Filed Weights   10/12/24 1304  Weight: 68 kg    Examination: Elderly frail man, Headache Lungs with crackles Heart regular rate and rhythm Extremities with no edema  Labs reviewed from earlier this week Chest x-ray in 2/6 with increased right upper and left lower lobe patchy opacities with background superimposed pulmonary fibrosis  Resolved problem list   Assessment and Plan  Acute hypoxic respiratory failure Altered mental status, confusion Background IPF on Esbriet  He is  going to get MRI to look for any neuro issues and ABG to check hypercarbia Check COVID, flu, RSV and virus panel If above workup is negative then he may be discharged on antibiotics for community-acquired pneumonia [Augmentin , azithromycin ] and steroids [prednisone  40 mg a day for 5 days]  Follow-up in pulmonary clinic.  Critical care time: NA   Lonna Coder MD [facility] See Amion for pager  If no response to pager , please call [phone] until 7pm After 7:00 pm call Elink  [phone] 10/12/2024, 3:01 PM        "

## 2024-10-12 NOTE — ED Triage Notes (Signed)
 PT BIB [facility] from home, PT has Hx of pulmonary fibrosis, wears 4L [place] at baseline. Pt states Dr took him off esbriet  past 2 weeks and since then, constant SOB w/wo exertion. PT aox4. Denies pain, N/V/D or HA.   EMS VS; 142/100, HR 80, Spo2 97%.

## 2024-10-29 ENCOUNTER — Ambulatory Visit: Admitting: Family Medicine

## 2024-11-20 ENCOUNTER — Ambulatory Visit: Admitting: Pulmonary Disease
